# Patient Record
Sex: Male | Born: 1935 | Race: White | Hispanic: No | Smoking: Never smoker
Health system: Southern US, Community
[De-identification: ages and names within clinical notes are randomized; demographics above are authoritative.]

## PROBLEM LIST (undated history)

## (undated) DIAGNOSIS — K219 Gastro-esophageal reflux disease without esophagitis: Secondary | ICD-10-CM

## (undated) DIAGNOSIS — C259 Malignant neoplasm of pancreas, unspecified: Secondary | ICD-10-CM

## (undated) DIAGNOSIS — E876 Hypokalemia: Secondary | ICD-10-CM

## (undated) DIAGNOSIS — R0600 Dyspnea, unspecified: Secondary | ICD-10-CM

## (undated) DIAGNOSIS — Z9289 Personal history of other medical treatment: Secondary | ICD-10-CM

## (undated) DIAGNOSIS — M5412 Radiculopathy, cervical region: Secondary | ICD-10-CM

## (undated) DIAGNOSIS — F32A Depression, unspecified: Secondary | ICD-10-CM

## (undated) DIAGNOSIS — J189 Pneumonia, unspecified organism: Secondary | ICD-10-CM

## (undated) DIAGNOSIS — F419 Anxiety disorder, unspecified: Secondary | ICD-10-CM

## (undated) DIAGNOSIS — N400 Enlarged prostate without lower urinary tract symptoms: Secondary | ICD-10-CM

## (undated) DIAGNOSIS — I10 Essential (primary) hypertension: Secondary | ICD-10-CM

## (undated) DIAGNOSIS — M539 Dorsopathy, unspecified: Secondary | ICD-10-CM

## (undated) DIAGNOSIS — D039 Melanoma in situ, unspecified: Secondary | ICD-10-CM

## (undated) DIAGNOSIS — F329 Major depressive disorder, single episode, unspecified: Secondary | ICD-10-CM

## (undated) DIAGNOSIS — H9190 Unspecified hearing loss, unspecified ear: Secondary | ICD-10-CM

## (undated) HISTORY — PX: TONSILLECTOMY: SUR1361

## (undated) HISTORY — DX: Melanoma in situ, unspecified: D03.9

## (undated) HISTORY — DX: Dorsopathy, unspecified: M53.9

## (undated) HISTORY — DX: Radiculopathy, cervical region: M54.12

## (undated) HISTORY — DX: Benign prostatic hyperplasia without lower urinary tract symptoms: N40.0

## (undated) HISTORY — PX: CATARACT EXTRACTION: SUR2

## (undated) HISTORY — DX: Essential (primary) hypertension: I10

## (undated) HISTORY — DX: Gastro-esophageal reflux disease without esophagitis: K21.9

---

## 1958-05-20 DIAGNOSIS — M539 Dorsopathy, unspecified: Secondary | ICD-10-CM

## 1958-05-20 HISTORY — DX: Dorsopathy, unspecified: M53.9

## 1998-04-19 LAB — FECAL OCCULT BLOOD, GUAIAC: Fecal Occult Blood: NEGATIVE

## 2002-05-26 ENCOUNTER — Encounter: Payer: Self-pay | Admitting: Internal Medicine

## 2003-05-16 ENCOUNTER — Encounter: Payer: Self-pay | Admitting: Internal Medicine

## 2004-02-16 ENCOUNTER — Encounter: Payer: Self-pay | Admitting: Internal Medicine

## 2004-03-30 ENCOUNTER — Ambulatory Visit: Payer: Self-pay | Admitting: Internal Medicine

## 2004-06-29 ENCOUNTER — Ambulatory Visit: Payer: Self-pay | Admitting: Internal Medicine

## 2005-02-18 ENCOUNTER — Ambulatory Visit: Payer: Self-pay | Admitting: Internal Medicine

## 2005-03-05 ENCOUNTER — Ambulatory Visit: Payer: Self-pay | Admitting: Internal Medicine

## 2005-05-22 ENCOUNTER — Ambulatory Visit: Payer: Self-pay | Admitting: Internal Medicine

## 2005-09-03 ENCOUNTER — Ambulatory Visit: Payer: Self-pay | Admitting: Internal Medicine

## 2006-03-05 ENCOUNTER — Ambulatory Visit: Payer: Self-pay | Admitting: Internal Medicine

## 2006-09-04 ENCOUNTER — Ambulatory Visit: Payer: Self-pay | Admitting: Internal Medicine

## 2006-09-04 LAB — CONVERTED CEMR LAB
ALT: 15 units/L (ref 0–40)
Albumin: 3.8 g/dL (ref 3.5–5.2)
Alkaline Phosphatase: 61 units/L (ref 39–117)
Bilirubin, Direct: 0.2 mg/dL (ref 0.0–0.3)
Chloride: 110 meq/L (ref 96–112)
Creatinine, Ser: 0.9 mg/dL (ref 0.4–1.5)
GFR calc non Af Amer: 86 mL/min
Sodium: 144 meq/L (ref 135–145)

## 2006-09-17 DIAGNOSIS — K219 Gastro-esophageal reflux disease without esophagitis: Secondary | ICD-10-CM

## 2006-09-17 DIAGNOSIS — I10 Essential (primary) hypertension: Secondary | ICD-10-CM

## 2006-09-17 DIAGNOSIS — M5412 Radiculopathy, cervical region: Secondary | ICD-10-CM | POA: Insufficient documentation

## 2006-10-01 ENCOUNTER — Ambulatory Visit: Payer: Self-pay | Admitting: Internal Medicine

## 2006-10-01 DIAGNOSIS — N401 Enlarged prostate with lower urinary tract symptoms: Secondary | ICD-10-CM

## 2006-10-01 DIAGNOSIS — N138 Other obstructive and reflux uropathy: Secondary | ICD-10-CM

## 2006-11-27 ENCOUNTER — Ambulatory Visit: Payer: Self-pay | Admitting: Internal Medicine

## 2006-11-27 DIAGNOSIS — M1991 Primary osteoarthritis, unspecified site: Secondary | ICD-10-CM | POA: Insufficient documentation

## 2007-03-06 ENCOUNTER — Ambulatory Visit: Payer: Self-pay | Admitting: Internal Medicine

## 2007-06-01 ENCOUNTER — Ambulatory Visit: Payer: Self-pay | Admitting: Internal Medicine

## 2007-12-04 ENCOUNTER — Ambulatory Visit: Payer: Self-pay | Admitting: Internal Medicine

## 2007-12-04 DIAGNOSIS — L570 Actinic keratosis: Secondary | ICD-10-CM

## 2007-12-08 LAB — CONVERTED CEMR LAB
BUN: 15 mg/dL (ref 6–23)
Basophils Relative: 0.7 % (ref 0.0–3.0)
CO2: 28 meq/L (ref 19–32)
Chloride: 105 meq/L (ref 96–112)
Creatinine, Ser: 0.9 mg/dL (ref 0.4–1.5)
Eosinophils Relative: 3.8 % (ref 0.0–5.0)
Glucose, Bld: 87 mg/dL (ref 70–99)
Lymphocytes Relative: 26.3 % (ref 12.0–46.0)
MCV: 96.1 fL (ref 78.0–100.0)
Neutrophils Relative %: 58.5 % (ref 43.0–77.0)
RBC: 4.14 M/uL — ABNORMAL LOW (ref 4.22–5.81)
WBC: 6 10*3/uL (ref 4.5–10.5)

## 2008-02-16 ENCOUNTER — Ambulatory Visit: Payer: Self-pay | Admitting: Internal Medicine

## 2008-06-07 ENCOUNTER — Ambulatory Visit: Payer: Self-pay | Admitting: Internal Medicine

## 2008-12-09 ENCOUNTER — Ambulatory Visit: Payer: Self-pay | Admitting: Internal Medicine

## 2008-12-12 ENCOUNTER — Ambulatory Visit: Payer: Self-pay | Admitting: Family Medicine

## 2008-12-12 DIAGNOSIS — M25569 Pain in unspecified knee: Secondary | ICD-10-CM | POA: Insufficient documentation

## 2009-02-22 ENCOUNTER — Ambulatory Visit: Payer: Self-pay | Admitting: Internal Medicine

## 2009-06-12 ENCOUNTER — Ambulatory Visit: Payer: Self-pay | Admitting: Internal Medicine

## 2009-06-14 LAB — CONVERTED CEMR LAB
AST: 15 units/L (ref 0–37)
Basophils Relative: 1.1 % (ref 0.0–3.0)
Bilirubin, Direct: 0 mg/dL (ref 0.0–0.3)
CO2: 30 meq/L (ref 19–32)
Calcium: 9.2 mg/dL (ref 8.4–10.5)
Eosinophils Relative: 5.8 % — ABNORMAL HIGH (ref 0.0–5.0)
HCT: 40.6 % (ref 39.0–52.0)
Lymphs Abs: 1.5 10*3/uL (ref 0.7–4.0)
MCV: 96.2 fL (ref 78.0–100.0)
Monocytes Absolute: 0.6 10*3/uL (ref 0.1–1.0)
Monocytes Relative: 11.1 % (ref 3.0–12.0)
Neutrophils Relative %: 56.9 % (ref 43.0–77.0)
Potassium: 4.3 meq/L (ref 3.5–5.1)
RBC: 4.22 M/uL (ref 4.22–5.81)
Sodium: 144 meq/L (ref 135–145)
TSH: 1.2 microintl units/mL (ref 0.35–5.50)
WBC: 5.8 10*3/uL (ref 4.5–10.5)

## 2009-11-30 ENCOUNTER — Ambulatory Visit: Payer: Self-pay | Admitting: Family Medicine

## 2009-11-30 DIAGNOSIS — L299 Pruritus, unspecified: Secondary | ICD-10-CM | POA: Insufficient documentation

## 2009-12-12 ENCOUNTER — Ambulatory Visit: Payer: Self-pay | Admitting: Internal Medicine

## 2010-02-08 ENCOUNTER — Ambulatory Visit: Payer: Self-pay | Admitting: Internal Medicine

## 2010-06-12 ENCOUNTER — Other Ambulatory Visit: Payer: Self-pay | Admitting: Internal Medicine

## 2010-06-12 ENCOUNTER — Ambulatory Visit
Admission: RE | Admit: 2010-06-12 | Discharge: 2010-06-12 | Payer: Self-pay | Source: Home / Self Care | Attending: Internal Medicine | Admitting: Internal Medicine

## 2010-06-12 LAB — HEPATIC FUNCTION PANEL
AST: 15 U/L (ref 0–37)
Albumin: 3.9 g/dL (ref 3.5–5.2)

## 2010-06-12 LAB — CBC WITH DIFFERENTIAL/PLATELET
Basophils Absolute: 0 10*3/uL (ref 0.0–0.1)
Eosinophils Absolute: 0.3 10*3/uL (ref 0.0–0.7)
HCT: 38.8 % — ABNORMAL LOW (ref 39.0–52.0)
Hemoglobin: 13.6 g/dL (ref 13.0–17.0)
Lymphs Abs: 1.5 10*3/uL (ref 0.7–4.0)
MCHC: 35.1 g/dL (ref 30.0–36.0)
MCV: 95 fl (ref 78.0–100.0)
Monocytes Absolute: 0.7 10*3/uL (ref 0.1–1.0)
Neutro Abs: 3.6 10*3/uL (ref 1.4–7.7)
RDW: 13.8 % (ref 11.5–14.6)

## 2010-06-12 LAB — RENAL FUNCTION PANEL
Albumin: 3.9 g/dL (ref 3.5–5.2)
BUN: 21 mg/dL (ref 6–23)
CO2: 28 mEq/L (ref 19–32)
Chloride: 109 mEq/L (ref 96–112)
Creatinine, Ser: 1 mg/dL (ref 0.4–1.5)

## 2010-06-12 LAB — TSH: TSH: 1.51 u[IU]/mL (ref 0.35–5.50)

## 2010-06-19 NOTE — Assessment & Plan Note (Signed)
Summary: rash/dlo   Vital Signs:  Patient profile:   77 year old male Height:      65 inches Weight:      141.6 pounds BMI:     23.65 Temp:     98.0 degrees F oral Pulse rate:   78 / minute Pulse rhythm:   regular BP sitting:   130 / 70  (left arm) Cuff size:   large  Vitals Entered By: Benny Lennert CMA Duncan Dull) (November 30, 2009 9:42 AM)  History of Present Illness: Chief complaint rash  77 year old with itchy rash, scattered B leg, upper thighs, few on torso not painful  has been doing  alot of gardening  dog but not in the bed.  No fever chills, sweats  GEN: Well-developed,well-nourished,in no acute distress; alert,appropriate and cooperative throughout examination HEENT: Normocephalic and atraumatic without obvious abnormalitiesEars, externally no deformities PULM: Breathing comfortably in no respiratory distress EXT: No clubbing, cyanosis, or edema PSYCH: Normally interactive. Cooperative during the interview. Pleasant. Friendly and conversant. Not anxious or depressed appearing. Normal, full affect.   Skin: scattered non-vesicular lesions, solitary, slightly red, evidence of excoriation  Allergies (verified): No Known Drug Allergies   Impression & Recommendations:  Problem # 1:  UNSPECIFIED PRURITIC DISORDER (ICD-698.9) rash, looks most like some type of insect bites, chigger bites be sure to treat dog for fleas  TAC two times a day   Orders: Prescription Created Electronically [phone])  Complete Medication List: 1)  Atenolol 25 Mg Tabs (Atenolol) .... Take one by mouth daily 2)  Gabapentin 600 Mg Tabs (Gabapentin) .... Take 1/2 by mouth every am, one by mouth every pm 3)  Aspirin 81 Mg Tbec (Aspirin) .... Take one by mouth daily 4)  Meloxicam 15 Mg Tabs (Meloxicam) .... 1/2 -1 tab  by mouth daily as needed for arthritis pain. take after eating 5)  Triamcinolone Acetonide 0.1 % Crea (Triamcinolone acetonide) .... Apply two times a day to affected  areas  Patient Instructions: 1)  SHINGLES --- CHECK WITH YOUR INSURANCE Prescriptions: TRIAMCINOLONE ACETONIDE 0.1 % CREA (TRIAMCINOLONE ACETONIDE) Apply two times a day to affected areas  #1 pound jar x 0   Entered and Authorized by:   Hannah Beat MD   Signed by:   Hannah Beat MD on 11/30/2009   Method used:   Electronically to        [facility] [phone][facility] (retail)       [address] [place], [place]  [address]       Ph: [phone] or [phone]       Fax: [phone]   RxID:   [id number]   Current Allergies (reviewed today): No known allergies

## 2010-06-19 NOTE — Assessment & Plan Note (Signed)
Summary: 6 MONTH FOLLOW UP/[facility]   Vital Signs:  Patient profile:   77 year old male Weight:      142.50 pounds Temp:     97.9 degrees F oral Pulse rate:   64 / minute Pulse rhythm:   regular BP sitting:   120 / 70  (left arm) Cuff size:   regular  Vitals Entered By: Sydell Axon LPN (December 12, 2009 10:22 AM) CC: 6 Month follow-up   History of Present Illness: In with wife as usual  still has nerve pain in neck if misses gabapentin, comes back pretty quick On the med, he does fine No apparent side effects  Urine stream still slow Nocturia x 2 generally---occ trouble getting back to sleep No sig daytime issues  No chest pain no SOB No leg or foot swelling  Has lots of stiffness in hands --all day some knee pain--better with heat occ uses tylenol--?slight help  Allergies: No Known Drug Allergies  Past History:  Past medical, surgical, family and social histories (including risk factors) reviewed for relevance to current acute and chronic problems.  Past Medical History: Reviewed history from 12/09/2008 and no changes required. GERD Hypertension Cervical radiculopathy with neuropathy Benign prostatic hypertrophy  Past Surgical History: Reviewed history from 09/17/2006 and no changes required. Back problems ?1960 Tonsils 1943 Cataract OD  Family History: Reviewed history from 09/17/2006 and no changes required. Dad died @52  unknown cause Mom died@72  breast cancer No CAD, DM  Social History: Retired--[employer] Married--2 sons Never Smoked Alcohol use-no   Has living will Wife is health care POA. Then would want sons.  would accept resuscitation but no prolonged ventilation No feeding tube if cognitively unaware  Review of Systems       Occ gas---gets it up substernally. No meds for this appetite is okay sleeps okay---occ uses plain tylenol and this helps weight is stable Rash is better--probably was chiggers  Physical Exam  General:   alert and normal appearance.   Neck:  supple, no masses, no thyromegaly, no carotid bruits, and no cervical lymphadenopathy.   Lungs:  normal respiratory effort, no intercostal retractions, no accessory muscle use, and normal breath sounds.   Heart:  normal rate, regular rhythm, and no gallop.   Soft systolic murmur LLSB Abdomen:  soft, non-tender, and no masses.   Msk:  no joint tenderness and no joint swelling.   Pulses:  1+ in feet Extremities:  no edema Neurologic:  alert & oriented X3, strength normal in all extremities, and gait normal.   Psych:  normally interactive, good eye contact, not anxious appearing, and not depressed appearing.     Impression & Recommendations:  Problem # 1:  HYPERTENSION (ICD-401.9) Assessment Unchanged doing fine no changes needed  His updated medication list for this problem includes:    Atenolol 25 Mg Tabs (Atenolol) .Marland Kitchen... Take one by mouth daily  BP today: 120/70 Prior BP: 130/70 (11/30/2009)  Labs Reviewed: K+: 4.3 (06/12/2009) Creat: : 1.0 (06/12/2009)     Problem # 2:  BENIGN PROSTATIC HYPERTROPHY (ICD-600.00) Assessment: Unchanged voids slow still but no Rx needed  Problem # 3:  CERVICAL RADICULOPATHY (ICD-723.4) Assessment: Unchanged needs the gabapentin uses meloxicam rarely  Problem # 4:  GERD (ICD-530.81) Assessment: Unchanged only occ has symptoms rarely uses gas-x  Complete Medication List: 1)  Atenolol 25 Mg Tabs (Atenolol) .... Take one by mouth daily 2)  Gabapentin 600 Mg Tabs (Gabapentin) .... Take 1/2 by mouth every am, one by mouth every  pm 3)  Aspirin 81 Mg Tbec (Aspirin) .... Take one by mouth daily 4)  Meloxicam 15 Mg Tabs (Meloxicam) .... 1/2 -1 tab  by mouth daily as needed for arthritis pain. take after eating 5)  Triamcinolone Acetonide 0.1 % Crea (Triamcinolone acetonide) .... Apply two times a day to affected areas  Current Allergies (reviewed today): No known allergies   Appended Document: 6 MONTH  FOLLOW UP/[facility]     Allergies: No Known Drug Allergies   Complete Medication List: 1)  Atenolol 25 Mg Tabs (Atenolol) .... Take one by mouth daily 2)  Gabapentin 600 Mg Tabs (Gabapentin) .... Take 1/2 by mouth every am, one by mouth every pm 3)  Aspirin 81 Mg Tbec (Aspirin) .... Take one by mouth daily 4)  Meloxicam 15 Mg Tabs (Meloxicam) .... 1/2 -1 tab  by mouth daily as needed for arthritis pain. take after eating 5)  Triamcinolone Acetonide 0.1 % Crea (Triamcinolone acetonide) .... Apply two times a day to affected areas  Patient Instructions: 1)  Please schedule a follow-up appointment in 6 months .

## 2010-06-19 NOTE — Assessment & Plan Note (Signed)
Summary: ROA 6 MTHS  CYD   Vital Signs:  Patient profile:   77 year old male Weight:      142 pounds Temp:     98.0 degrees F oral BP sitting:   126 / 70  (left arm) Cuff size:   large  Vitals Entered By: Mervin Hack CMA Duncan Dull) (June 12, 2009 11:09 AM) CC: 6 month follow-up   History of Present Illness: Doing fairly well Still with left knee pain has meloxicam 15mg  to take does give relief but bothers stomach some  Doing okay otherwise occ headache but nothing consistent  Doesn't take BP No chest pain No SOB No edema  Voids slowly not bad frequency in day but notes 2/night nocturia still okay without meds  Allergies: No Known Drug Allergies  Past History:  Past medical, surgical, family and social histories (including risk factors) reviewed for relevance to current acute and chronic problems.  Past Medical History: Reviewed history from 12/09/2008 and no changes required. GERD Hypertension Cervical radiculopathy with neuropathy Benign prostatic hypertrophy  Past Surgical History: Reviewed history from 09/17/2006 and no changes required. Back problems ?1960 Tonsils 1943 Cataract OD  Family History: Reviewed history from 09/17/2006 and no changes required. Dad died @52  unknown cause Mom died@72  breast cancer No CAD, DM  Social History: Reviewed history from 09/17/2006 and no changes required. Retired--[employer] Married--2 sons Never Smoked Alcohol use-no   Has living will Wife is health care POA  Review of Systems       sleeps okay appetite is okay--best in the AM weight is stable  Physical Exam  General:  alert and normal appearance.   Neck:  supple, no masses, no thyromegaly, no carotid bruits, and no cervical lymphadenopathy.   Lungs:  normal respiratory effort and normal breath sounds.   Heart:  normal rate, regular rhythm, no murmur, and no gallop.   Abdomen:  soft and non-tender.   No suprapubic dullness Extremities:   no edema Psych:  normally interactive, good eye contact, not anxious appearing, and not depressed appearing.     Impression & Recommendations:  Problem # 1:  HYPERTENSION (ICD-401.9) Assessment Unchanged  good control due for labs  His updated medication list for this problem includes:    Atenolol 25 Mg Tabs (Atenolol) .Marland Kitchen... Take one by mouth daily  BP today: 126/70 Prior BP: 120/78 (12/12/2008)  Labs Reviewed: K+: 4.8 (12/04/2007) Creat: : 0.9 (12/04/2007)     Orders: TLB-Renal Function Panel (80069-RENAL) TLB-CBC Platelet - w/Differential (85025-CBCD) TLB-Hepatic/Liver Function Pnl (80076-HEPATIC) TLB-TSH (Thyroid Stimulating Hormone) (84443-TSH) Venipuncture (52841)  Problem # 2:  BENIGN PROSTATIC HYPERTROPHY (ICD-600.00) Assessment: Unchanged mild stable symptoms no meds needed  Problem # 3:  OSTEOARTHROSIS, LOCAL, PRIMARY, UNSPC SITE (ICD-715.10) Assessment: Unchanged left knee discussed how to best use the meloxicam okay to try heat--he does use this May need cortisone shot again  His updated medication list for this problem includes:    Aspirin 81 Mg Tbec (Aspirin) .Marland Kitchen... Take one by mouth daily    Meloxicam 15 Mg Tabs (Meloxicam) .Marland Kitchen... 1/2 -1 tab  by mouth daily as needed for arthritis pain. take after eating  Problem # 4:  CERVICAL RADICULOPATHY (ICD-723.4) Assessment: Unchanged seems to be well controlled on gabapentin  Complete Medication List: 1)  Atenolol 25 Mg Tabs (Atenolol) .... Take one by mouth daily 2)  Gabapentin 600 Mg Tabs (Gabapentin) .... Take 1/2 by mouth every am, one by mouth every pm 3)  Aspirin 81 Mg Tbec (Aspirin) .Marland KitchenMarland KitchenMarland Kitchen  Take one by mouth daily 4)  Meloxicam 15 Mg Tabs (Meloxicam) .... 1/2 -1 tab  by mouth daily as needed for arthritis pain. take after eating  Patient Instructions: 1)  Please schedule a follow-up appointment in 6 months .   Current Allergies (reviewed today): No known allergies

## 2010-06-19 NOTE — Assessment & Plan Note (Signed)
Summary: FLU SHOT / LFW  Nurse Visit   Allergies: No Known Drug Allergies  Immunizations Administered:  Influenza Vaccine # 1:    Vaccine Type: Fluvax 3+    Site: left deltoid    Mfr: GlaxoSmithKline    Dose: 0.5 ml    Route: IM    Given by: Mervin Hack CMA (AAMA)    Exp. Date: 11/17/2010    Lot #: [id number]    VIS given: 12/12/09 version given February 08, 2010.  Flu Vaccine Consent Questions:    Do you have a history of severe allergic reactions to this vaccine? no    Any prior history of allergic reactions to egg and/or gelatin? no    Do you have a sensitivity to the preservative Thimersol? no    Do you have a past history of Guillan-Barre Syndrome? no    Do you currently have an acute febrile illness? no    Have you ever had a severe reaction to latex? no    Vaccine information given and explained to patient? yes  Orders Added: 1)  Flu Vaccine 26yrs + [90658] 2)  Admin 1st Vaccine [09811]

## 2010-06-21 NOTE — Assessment & Plan Note (Signed)
Summary: ROA FOR 6 MONTH FOLLOW-UP/JRR   Vital Signs:  Patient profile:   77 year old male Weight:      144 pounds Temp:     98.1 degrees F oral Pulse rate:   71 / minute Pulse rhythm:   regular BP sitting:   122 / 70  (left arm) Cuff size:   regular  Vitals Entered By: Mervin Hack CMA Duncan Dull) (June 12, 2010 10:13 AM) CC: 6 month follow-up   History of Present Illness: Here with wife he has to supervise her all the time Discussed respite as this can be stressful for him  Some sleep problems May awaken 2-3AM and not go back to sleep No naps No sig daytime somnolence Occ takes tylenol PM-- generally helps a little  No chest pain No SOB no edema  Has slow urination and frequency Nocturia x 2 usually  Does get soreness and stiffness--esp in hands some in legs also Tylenol occ---may help with sleep Advised 2 tylenol at night without the "PM"  Allergies: No Known Drug Allergies  Past History:  Past medical, surgical, family and social histories (including risk factors) reviewed for relevance to current acute and chronic problems.  Past Medical History: Reviewed history from 12/09/2008 and no changes required. GERD Hypertension Cervical radiculopathy with neuropathy Benign prostatic hypertrophy  Past Surgical History: Reviewed history from 09/17/2006 and no changes required. Back problems ?1960 Tonsils 1943 Cataract OD  Family History: Reviewed history from 09/17/2006 and no changes required. Dad died @52  unknown cause Mom died@72  breast cancer No CAD, DM  Social History: Reviewed history from 12/12/2009 and no changes required. Retired--[employer] Married--2 sons Never Smoked Alcohol use-no   Has living will Wife is health care POA. Then would want sons.  would accept resuscitation but no prolonged ventilation No feeding tube if cognitively unaware  Review of Systems       Occ gets gas pushing up in stomach No heartburn Bowels  okay appetite is fair---they usually eat 2 meals per day weight stable  Physical Exam  General:  alert and normal appearance.   Neck:  supple, no masses, no thyromegaly, no carotid bruits, and no cervical lymphadenopathy.   Lungs:  normal respiratory effort, no intercostal retractions, no accessory muscle use, and normal breath sounds.   Heart:  normal rate, regular rhythm, no murmur, and no gallop.   Abdomen:  soft and non-tender.   Msk:  no joint tenderness and no joint swelling.   Sig stiffness with movement Extremities:  no edema Psych:  normally interactive, good eye contact, not anxious appearing, and not depressed appearing.     Impression & Recommendations:  Problem # 1:  HYPERTENSION (ICD-401.9) Assessment Unchanged  good control no changes due for labs  His updated medication list for this problem includes:    Atenolol 25 Mg Tabs (Atenolol) .Marland Kitchen... Take one by mouth daily  BP today: 122/70 Prior BP: 120/70 (12/12/2009)  Labs Reviewed: K+: 4.3 (06/12/2009) Creat: : 1.0 (06/12/2009)     Orders: TLB-Renal Function Panel (80069-RENAL) TLB-CBC Platelet - w/Differential (85025-CBCD) TLB-Hepatic/Liver Function Pnl (80076-HEPATIC) TLB-TSH (Thyroid Stimulating Hormone) (84443-TSH) Venipuncture (04540)  Problem # 2:  OSTEOARTHROSIS, LOCAL, PRIMARY, UNSPC SITE (ICD-715.10) Assessment: Deteriorated discussed regular tylenol  The following medications were removed from the medication list:    Meloxicam 15 Mg Tabs (Meloxicam) .Marland Kitchen... 1/2 -1 tab  by mouth daily as needed for arthritis pain. take after eating His updated medication list for this problem includes:    Aspirin 81  Mg Tbec (Aspirin) .Marland Kitchen... Take one by mouth daily  Problem # 3:  CERVICAL RADICULOPATHY (ICD-723.4) Assessment: Unchanged still controlled fairly well with 1.5 gabapentin daily Left side of neck and shoulder  Problem # 4:  BENIGN PROSTATIC HYPERTROPHY (ICD-600.00) Assessment: Unchanged mild  symptoms no meds for now  Complete Medication List: 1)  Atenolol 25 Mg Tabs (Atenolol) .... Take one by mouth daily 2)  Gabapentin 600 Mg Tabs (Gabapentin) .... Take 1/2 by mouth every am, one by mouth every pm 3)  Aspirin 81 Mg Tbec (Aspirin) .... Take one by mouth daily  Patient Instructions: 1)  Please try arthritis tylenol (acetominophen) 650mg  three times a day for the arthritis 2)  Please schedule a follow-up appointment in 6 months .    Orders Added: 1)  TLB-Renal Function Panel [80069-RENAL] 2)  TLB-CBC Platelet - w/Differential [85025-CBCD] 3)  TLB-Hepatic/Liver Function Pnl [80076-HEPATIC] 4)  TLB-TSH (Thyroid Stimulating Hormone) [84443-TSH] 5)  Venipuncture [04540] 6)  Est. Patient Level IV [98119]    Current Allergies (reviewed today): No known allergies

## 2010-08-27 ENCOUNTER — Other Ambulatory Visit: Payer: Self-pay | Admitting: Internal Medicine

## 2010-10-26 ENCOUNTER — Other Ambulatory Visit: Payer: Self-pay | Admitting: *Deleted

## 2010-10-26 NOTE — Telephone Encounter (Signed)
[facility] [address] is on your desk.  Please check quantity and refills.

## 2010-10-27 NOTE — Telephone Encounter (Signed)
Please confirm dose with patient and okay to refill for 1 year

## 2010-10-29 MED ORDER — GABAPENTIN 600 MG PO TABS: ORAL_TABLET | ORAL | Status: DC

## 2010-10-29 NOTE — Telephone Encounter (Signed)
rx sent to pharmacy by e-script  

## 2010-10-31 ENCOUNTER — Ambulatory Visit (INDEPENDENT_AMBULATORY_CARE_PROVIDER_SITE_OTHER): Payer: Medicare Other | Admitting: Internal Medicine

## 2010-10-31 ENCOUNTER — Encounter: Payer: Self-pay | Admitting: Internal Medicine

## 2010-10-31 DIAGNOSIS — M159 Polyosteoarthritis, unspecified: Secondary | ICD-10-CM | POA: Insufficient documentation

## 2010-10-31 DIAGNOSIS — M1710 Unilateral primary osteoarthritis, unspecified knee: Secondary | ICD-10-CM

## 2010-10-31 MED ORDER — TRIAMCINOLONE ACETONIDE 40 MG/ML IJ SUSP: 40.0000 mg | Freq: Once | INTRAMUSCULAR | Status: DC

## 2010-10-31 NOTE — Progress Notes (Signed)
  Subjective:    Patient ID: Jose [name], male    DOB: Apr 29, 1924, 77 y.o.   MRN: [id number]  HPI Having pain in both knees but right knee is "throbbing" Pain has gone back for a long time but now having trouble even walking on the right knee Couldn't hold off till July  Tried ibuprofen 1-2 daily, or aleve 1-2 daily. Occ took both No clear help though the ibuprofen may have helped at first  Has gotten shot in left knee in past--that really helped then  No sig swelling No recent injury  Current Outpatient Prescriptions on File Prior to Visit  Medication Sig Dispense Refill  . atenolol (TENORMIN) 25 MG tablet TAKE 1 TABLET EVERY DAY  30 tablet  10  . gabapentin (NEURONTIN) 600 MG tablet Take one half by mouth every morning and one by mouth every evening  45 tablet  11   Past Medical History  Diagnosis Date  . GERD (gastroesophageal reflux disease)   . Hypertension   . BPH (benign prostatic hypertrophy)   . Cervical radiculopathy     with neuropathy  . Back problem 1960    Past Surgical History  Procedure Date  . Tonsillectomy   . Cataract extraction     OD    Family History  Problem Relation Age of Onset  . Coronary artery disease Neg Hx   . Diabetes Neg Hx     History   Social History  . Marital Status: Married    Spouse Name: N/A    Number of Children: 2  . Years of Education: N/A   Occupational History  . retired- Merchandiser, retail [employer]    Social History Main Topics  . Smoking status: Never Smoker   . Smokeless tobacco: Never Used  . Alcohol Use: No  . Drug Use: No  . Sexually Active: Not on file   Other Topics Concern  . Not on file   Social History Narrative   Has living willWife is health care POA. Then would want sons. would accept resuscitation but no prolonged ventilationNo feeding tube if cognitively unaware   Review of Systems No other sig joint issues No recent heartburn issues--rare symptoms and occ takes a tums    Objective:   Physical Exam  Constitutional: He appears well-developed and well-nourished. No distress.  Musculoskeletal:       Knees without sig effusion Left knee has full ROM. ?slight crepitus. No ligament or meniscus findings Right knee has marked crepitus and pain with passive ROM. Mild pain with MacMurray tests but no clear meniscus findings No ligament instability there  Neurological:       Gait is stiff but he has full weight bearing Uses cane          Assessment & Plan:

## 2010-10-31 NOTE — Patient Instructions (Signed)
Please keep your July follow up appointment You can try ibuprofen 200-400mg  up to three times daily

## 2010-10-31 NOTE — Assessment & Plan Note (Signed)
Procedure  Sterile prep to right knee Medial approach Local with 2cc 2% plain lido 40mg  kenalog and 7cc 2% lido instilled in knee without difficulty Patient noted immediate pain relief Discussed home care

## 2010-10-31 NOTE — Assessment & Plan Note (Signed)
Having some more difficulty in general Discussed heat and ibuprofen use more regularly if needed

## 2010-12-11 ENCOUNTER — Encounter: Payer: Self-pay | Admitting: Internal Medicine

## 2010-12-11 ENCOUNTER — Ambulatory Visit (INDEPENDENT_AMBULATORY_CARE_PROVIDER_SITE_OTHER): Payer: Medicare Other | Admitting: Internal Medicine

## 2010-12-11 DIAGNOSIS — F439 Reaction to severe stress, unspecified: Secondary | ICD-10-CM

## 2010-12-11 DIAGNOSIS — Z733 Stress, not elsewhere classified: Secondary | ICD-10-CM

## 2010-12-11 DIAGNOSIS — N400 Enlarged prostate without lower urinary tract symptoms: Secondary | ICD-10-CM

## 2010-12-11 DIAGNOSIS — I10 Essential (primary) hypertension: Secondary | ICD-10-CM

## 2010-12-11 DIAGNOSIS — M5412 Radiculopathy, cervical region: Secondary | ICD-10-CM

## 2010-12-11 NOTE — Assessment & Plan Note (Signed)
Voiding is slow Nocturia x 2-3 Okay without meds

## 2010-12-11 NOTE — Assessment & Plan Note (Signed)
BP Readings from Last 3 Encounters:  12/11/10 110/60  10/31/10 140/60  06/12/10 122/70   Good control No changes needed

## 2010-12-11 NOTE — Assessment & Plan Note (Signed)
Still gets good relief with gabapentin If he forgets, pain returns

## 2010-12-11 NOTE — Assessment & Plan Note (Signed)
Clear caregiving stress Discussed need for help Gave "caregiver bill of rights"

## 2010-12-11 NOTE — Progress Notes (Signed)
  Subjective:    Patient ID: Jose [name], male    DOB: 1923-07-24, 77 y.o.   MRN: [id number]  HPI He is doing okay himself Ongoing stress with wife with Altzheimers who is worsening She eloped and fell on highway. Fortunately, just with facial bruising He is not able to keep her alone DIL is looking into help----personal care aide Still very stressed. He does all the housework, etc  No chest pain No SOb Tries to stay active--housework, garden (1/2 acre)  Only occ pain from nerve in neck Gabapentin seems to keep it controlled He does note the pain if he forgets dose---affects his sleep  Knee is some better with cortisone shot Uses ibuprofen 200mg  daily and it helps  Stomach generally quiet  Current Outpatient Prescriptions on File Prior to Visit  Medication Sig Dispense Refill  . aspirin 81 MG tablet Take 81 mg by mouth daily.        Marland Kitchen gabapentin (NEURONTIN) 600 MG tablet Take one half by mouth every morning and one by mouth every evening  45 tablet  11   Current Facility-Administered Medications on File Prior to Visit  Medication Dose Route Frequency Provider Last Rate Last Dose  . triamcinolone acetonide (KENALOG-40) injection 40 mg  40 mg Intra-articular Once Varney Baas, MD        No Known Allergies  Past Medical History  Diagnosis Date  . GERD (gastroesophageal reflux disease)   . Hypertension   . BPH (benign prostatic hypertrophy)   . Cervical radiculopathy     with neuropathy  . Back problem 1960    Past Surgical History  Procedure Date  . Tonsillectomy   . Cataract extraction     OD    Family History  Problem Relation Age of Onset  . Coronary artery disease Neg Hx   . Diabetes Neg Hx     History   Social History  . Marital Status: Married    Spouse Name: N/A    Number of Children: 2  . Years of Education: N/A   Occupational History  . retired- Merchandiser, retail [employer]    Social History Main Topics  . Smoking status: Never Smoker   .  Smokeless tobacco: Never Used  . Alcohol Use: No  . Drug Use: No  . Sexually Active: Not on file   Other Topics Concern  . Not on file   Social History Narrative   Has living willWife is health care POA. Then would want sons. would accept resuscitation but no prolonged ventilationNo feeding tube if cognitively unaware   Review of Systems Appetite is fine Weight is stable Sleep is not great--light to monitor wife     Objective:   Physical Exam  Constitutional: He appears well-developed and well-nourished. No distress.  Neck: Normal range of motion. Neck supple. No thyromegaly present.  Cardiovascular: Normal rate, regular rhythm and normal heart sounds.  Exam reveals no gallop.   No murmur heard. Pulmonary/Chest: Effort normal and breath sounds normal. No respiratory distress. He has no wheezes. He has no rales.  Abdominal: Soft. There is no tenderness.  Musculoskeletal: Normal range of motion. He exhibits no edema and no tenderness.  Lymphadenopathy:    He has no cervical adenopathy.  Psychiatric: He has a normal mood and affect. His behavior is normal. Judgment and thought content normal.          Assessment & Plan:

## 2011-02-26 ENCOUNTER — Ambulatory Visit (INDEPENDENT_AMBULATORY_CARE_PROVIDER_SITE_OTHER): Payer: Medicare Other

## 2011-02-26 DIAGNOSIS — Z23 Encounter for immunization: Secondary | ICD-10-CM

## 2011-06-14 ENCOUNTER — Ambulatory Visit: Payer: Medicare Other | Admitting: Internal Medicine

## 2011-06-25 ENCOUNTER — Ambulatory Visit (INDEPENDENT_AMBULATORY_CARE_PROVIDER_SITE_OTHER): Payer: Medicare Other | Admitting: Internal Medicine

## 2011-06-25 ENCOUNTER — Encounter: Payer: Self-pay | Admitting: Internal Medicine

## 2011-06-25 VITALS — BP 128/68 | HR 59 | Temp 98.3°F | Ht 65.0 in | Wt 142.0 lb

## 2011-06-25 DIAGNOSIS — Z733 Stress, not elsewhere classified: Secondary | ICD-10-CM

## 2011-06-25 DIAGNOSIS — M179 Osteoarthritis of knee, unspecified: Secondary | ICD-10-CM

## 2011-06-25 DIAGNOSIS — M5412 Radiculopathy, cervical region: Secondary | ICD-10-CM

## 2011-06-25 DIAGNOSIS — M1710 Unilateral primary osteoarthritis, unspecified knee: Secondary | ICD-10-CM

## 2011-06-25 DIAGNOSIS — F439 Reaction to severe stress, unspecified: Secondary | ICD-10-CM

## 2011-06-25 DIAGNOSIS — I10 Essential (primary) hypertension: Secondary | ICD-10-CM

## 2011-06-25 DIAGNOSIS — N400 Enlarged prostate without lower urinary tract symptoms: Secondary | ICD-10-CM

## 2011-06-25 LAB — CBC WITH DIFFERENTIAL/PLATELET
Basophils Relative: 0.9 % (ref 0.0–3.0)
Eosinophils Relative: 7.4 % — ABNORMAL HIGH (ref 0.0–5.0)
Lymphocytes Relative: 24.5 % (ref 12.0–46.0)
MCV: 95.7 fl (ref 78.0–100.0)
Monocytes Absolute: 0.7 10*3/uL (ref 0.1–1.0)
Monocytes Relative: 11.3 % (ref 3.0–12.0)
Neutrophils Relative %: 55.9 % (ref 43.0–77.0)
Platelets: 153 10*3/uL (ref 150.0–400.0)
RBC: 4.19 Mil/uL — ABNORMAL LOW (ref 4.22–5.81)
WBC: 6.4 10*3/uL (ref 4.5–10.5)

## 2011-06-25 NOTE — Assessment & Plan Note (Signed)
BP Readings from Last 3 Encounters:  06/25/11 128/68  12/11/10 110/60  10/31/10 140/60   Good control Due for labs No change

## 2011-06-25 NOTE — Assessment & Plan Note (Signed)
Ongoing stress with wife Needs respite  Will refer to [facility] to try to access community resources

## 2011-06-25 NOTE — Progress Notes (Signed)
  Subjective:    Patient ID: Jose [name], male    DOB: 1924-04-10, 78 y.o.   MRN: [id number]  HPI Ongoing issues with wife Can't really do anything Still not sleeping ---up wandering at night, turning on lights, etc He has door fixed so she can't leave Does still try to leave if not attended all the time Still no aide---just has cleaner  No chest pain No SOB No headaches No dizziness or syncope  Gets shoulder pains if he forgets the gabapentin Knee pains regularly--ibuprofen helps  Voids okay but not great Slow with initiation Stable 1-2 per night nocturia   Current Outpatient Prescriptions on File Prior to Visit  Medication Sig Dispense Refill  . aspirin 81 MG tablet Take 81 mg by mouth daily.        Marland Kitchen atenolol (TENORMIN) 25 MG tablet Take 25 mg by mouth daily.        Marland Kitchen gabapentin (NEURONTIN) 600 MG tablet Take one half by mouth every morning and one by mouth every evening  45 tablet  11   Current Facility-Administered Medications on File Prior to Visit  Medication Dose Route Frequency Provider Last Rate Last Dose  . DISCONTD: triamcinolone acetonide (KENALOG-40) injection 40 mg  40 mg Intra-articular Once Varney Baas, MD        No Known Allergies  Past Medical History  Diagnosis Date  . GERD (gastroesophageal reflux disease)   . Hypertension   . BPH (benign prostatic hypertrophy)   . Cervical radiculopathy     with neuropathy  . Back problem 1960    Past Surgical History  Procedure Date  . Tonsillectomy   . Cataract extraction     OD    Family History  Problem Relation Age of Onset  . Coronary artery disease Neg Hx   . Diabetes Neg Hx     History   Social History  . Marital Status: Married    Spouse Name: N/A    Number of Children: 2  . Years of Education: N/A   Occupational History  . retired- Merchandiser, retail [employer]    Social History Main Topics  . Smoking status: Never Smoker   . Smokeless tobacco: Never Used  . Alcohol Use: No  .  Drug Use: No  . Sexually Active: Not on file   Other Topics Concern  . Not on file   Social History Narrative   Has living willWife is health care POA. Then would want sons. would accept resuscitation but no prolonged ventilationNo feeding tube if cognitively unaware   Review of Systems Irritated area on right temple Appetite is okay Weight is fairly stable    Objective:   Physical Exam  Constitutional: He appears well-developed and well-nourished. No distress.  Neck: Normal range of motion. Neck supple. No thyromegaly present.  Cardiovascular: Normal rate and regular rhythm.  Exam reveals no gallop.        ?faint systolic murmur  Pulmonary/Chest: Effort normal and breath sounds normal. No respiratory distress. He has no wheezes. He has no rales.  Musculoskeletal: He exhibits no edema and no tenderness.  Lymphadenopathy:    He has no cervical adenopathy.  Psychiatric: He has a normal mood and affect. His behavior is normal. Judgment and thought content normal.          Assessment & Plan:

## 2011-06-25 NOTE — Assessment & Plan Note (Signed)
Uses the gabapentin Gets shoulder pain if he misses doses

## 2011-06-25 NOTE — Assessment & Plan Note (Signed)
Uses ibuprofen with relief

## 2011-06-25 NOTE — Assessment & Plan Note (Signed)
Ongoing symptoms but he prefers no meds for now

## 2011-06-26 LAB — TSH: TSH: 1.74 u[IU]/mL (ref 0.35–5.50)

## 2011-06-26 LAB — BASIC METABOLIC PANEL
BUN: 23 mg/dL (ref 6–23)
Calcium: 9.2 mg/dL (ref 8.4–10.5)
Chloride: 108 mEq/L (ref 96–112)
Creatinine, Ser: 1 mg/dL (ref 0.4–1.5)
GFR: 72.04 mL/min (ref >60.00)

## 2011-06-26 LAB — HEPATIC FUNCTION PANEL
ALT: 18 U/L (ref 0–53)
AST: 18 U/L (ref 0–37)
Alkaline Phosphatase: 62 U/L (ref 39–117)
Bilirubin, Direct: 0.1 mg/dL (ref 0.0–0.3)
Total Bilirubin: 0.7 mg/dL (ref 0.3–1.2)

## 2011-07-26 ENCOUNTER — Other Ambulatory Visit: Payer: Self-pay | Admitting: Internal Medicine

## 2011-10-25 ENCOUNTER — Other Ambulatory Visit: Payer: Self-pay | Admitting: *Deleted

## 2011-10-25 ENCOUNTER — Encounter: Payer: Self-pay | Admitting: Internal Medicine

## 2011-10-25 ENCOUNTER — Ambulatory Visit (INDEPENDENT_AMBULATORY_CARE_PROVIDER_SITE_OTHER): Payer: Medicare Other | Admitting: Internal Medicine

## 2011-10-25 VITALS — BP 102/60 | HR 83 | Temp 99.0°F | Ht 65.0 in | Wt 139.0 lb

## 2011-10-25 DIAGNOSIS — M5412 Radiculopathy, cervical region: Secondary | ICD-10-CM

## 2011-10-25 DIAGNOSIS — N400 Enlarged prostate without lower urinary tract symptoms: Secondary | ICD-10-CM

## 2011-10-25 DIAGNOSIS — F439 Reaction to severe stress, unspecified: Secondary | ICD-10-CM

## 2011-10-25 DIAGNOSIS — F430 Acute stress reaction: Secondary | ICD-10-CM

## 2011-10-25 DIAGNOSIS — I10 Essential (primary) hypertension: Secondary | ICD-10-CM

## 2011-10-25 DIAGNOSIS — J069 Acute upper respiratory infection, unspecified: Secondary | ICD-10-CM | POA: Insufficient documentation

## 2011-10-25 MED ORDER — GABAPENTIN 600 MG PO TABS: ORAL_TABLET | ORAL | Status: DC

## 2011-10-25 NOTE — Assessment & Plan Note (Signed)
Mild symptoms Not ready for meds yet

## 2011-10-25 NOTE — Progress Notes (Signed)
  Subjective:    Patient ID: Jose [name], male    DOB: 06-22-1923, 78 y.o.   MRN: [id number]  HPI Started with upper respiratory symptoms yesterday Bad drainage into throat No fever Only occ cough--slight mucus No SOB Mild sore throat  Ongoing stress with wife Family did get him to agree to aide twice a week She is incontinent now---more care stress  Voiding fairly well Nocturia x 2  Neck pain continues Gabapentin does control  No chest pain No dizziness or syncope  No edema  Current Outpatient Prescriptions on File Prior to Visit  Medication Sig Dispense Refill  . aspirin 81 MG tablet Take 81 mg by mouth daily.        Marland Kitchen atenolol (TENORMIN) 25 MG tablet TAKE 1 TABLET EVERY DAY  30 tablet  10  . gabapentin (NEURONTIN) 600 MG tablet Take one half by mouth every morning and one by mouth every evening  45 tablet  11  . DISCONTD: atenolol (TENORMIN) 25 MG tablet Take 25 mg by mouth daily.          No Known Allergies  Past Medical History  Diagnosis Date  . GERD (gastroesophageal reflux disease)   . Hypertension   . BPH (benign prostatic hypertrophy)   . Cervical radiculopathy     with neuropathy  . Back problem 1960    Past Surgical History  Procedure Date  . Tonsillectomy   . Cataract extraction     OD    Family History  Problem Relation Age of Onset  . Coronary artery disease Neg Hx   . Diabetes Neg Hx     History   Social History  . Marital Status: Married    Spouse Name: N/A    Number of Children: 2  . Years of Education: N/A   Occupational History  . retired- Merchandiser, retail [employer]    Social History Main Topics  . Smoking status: Never Smoker   . Smokeless tobacco: Never Used  . Alcohol Use: No  . Drug Use: No  . Sexually Active: Not on file   Other Topics Concern  . Not on file   Social History Narrative   Has living willWife is health care POA. Then would want sons. would accept resuscitation but no prolonged ventilationNo feeding  tube if cognitively unaware   Review of Systems Appetite fair Weight down 3# Sleeps okay usually---does wake up when wife is wandering     Objective:   Physical Exam  Constitutional: He appears well-developed and well-nourished.       Appears tired but no distress  HENT:  Mouth/Throat: Oropharynx is clear and moist. No oropharyngeal exudate.       No sinus tenderness TMs normal Mild nasal congestion  Neck: Normal range of motion. Neck supple.  Cardiovascular: Normal rate, regular rhythm and normal heart sounds.  Exam reveals no gallop.   No murmur heard. Pulmonary/Chest: Effort normal and breath sounds normal. No respiratory distress. He has no wheezes. He has no rales.  Musculoskeletal: He exhibits no edema and no tenderness.  Lymphadenopathy:    He has no cervical adenopathy.  Psychiatric: He has a normal mood and affect. His behavior is normal.          Assessment & Plan:

## 2011-10-25 NOTE — Assessment & Plan Note (Signed)
Seems to be typical viral illness Discussed supportive care

## 2011-10-25 NOTE — Assessment & Plan Note (Signed)
Ongoing and probably worse Referred to [facility] DIL and family trying to convince him to take more help Caregiver bill of rights given

## 2011-10-25 NOTE — Assessment & Plan Note (Signed)
BP Readings from Last 3 Encounters:  10/25/11 102/60  06/25/11 128/68  12/11/10 110/60   Control fine Will try off the atenolol

## 2011-10-25 NOTE — Assessment & Plan Note (Signed)
Does okay with the gabapentin 

## 2012-02-24 ENCOUNTER — Encounter: Payer: Self-pay | Admitting: Internal Medicine

## 2012-02-24 ENCOUNTER — Ambulatory Visit (INDEPENDENT_AMBULATORY_CARE_PROVIDER_SITE_OTHER): Payer: Medicare Other | Admitting: Internal Medicine

## 2012-02-24 VITALS — BP 122/70 | HR 74 | Temp 97.7°F | Wt 139.0 lb

## 2012-02-24 DIAGNOSIS — I10 Essential (primary) hypertension: Secondary | ICD-10-CM

## 2012-02-24 DIAGNOSIS — M5412 Radiculopathy, cervical region: Secondary | ICD-10-CM

## 2012-02-24 DIAGNOSIS — M159 Polyosteoarthritis, unspecified: Secondary | ICD-10-CM

## 2012-02-24 DIAGNOSIS — F439 Reaction to severe stress, unspecified: Secondary | ICD-10-CM

## 2012-02-24 DIAGNOSIS — Z23 Encounter for immunization: Secondary | ICD-10-CM

## 2012-02-24 DIAGNOSIS — Z733 Stress, not elsewhere classified: Secondary | ICD-10-CM

## 2012-02-24 NOTE — Addendum Note (Signed)
Addended by: Sueanne Margarita on: 02/24/2012 11:54 AM   Modules accepted: Orders

## 2012-02-24 NOTE — Assessment & Plan Note (Signed)
Does okay with the ibuprofen Can increase dose if needed

## 2012-02-24 NOTE — Assessment & Plan Note (Signed)
Still has some pain Fair control on the gabapentin

## 2012-02-24 NOTE — Assessment & Plan Note (Signed)
BP Readings from Last 3 Encounters:  02/24/12 122/70  10/25/11 102/60  06/25/11 128/68   Bp fine without meds now

## 2012-02-24 NOTE — Progress Notes (Signed)
  Subjective:    Patient ID: Jose [name], male    DOB: 1923-09-12, 78 y.o.   MRN: [id number]  HPI Here with demented wife and DIL Ongoing stress with her care Incontinent regularly (not always) and requires a lot of care---including feeding Has aide only 3 times per day  Sleeping fairly well  Still on the gabapentin This still seems to help with his neck pain  Voids okay Nocturia x 2-3---stable No daytime frequency or urgency  Does have pain in hands and knees Uses ibuprofen 200mg -- 2 a day and this helps  Current Outpatient Prescriptions on File Prior to Visit  Medication Sig Dispense Refill  . aspirin 81 MG tablet Take 81 mg by mouth daily.        Marland Kitchen gabapentin (NEURONTIN) 600 MG tablet Take one half by mouth every morning and one by mouth every evening  45 tablet  11    No Known Allergies  Past Medical History  Diagnosis Date  . GERD (gastroesophageal reflux disease)   . Hypertension   . BPH (benign prostatic hypertrophy)   . Cervical radiculopathy     with neuropathy  . Back problem 1960    Past Surgical History  Procedure Date  . Tonsillectomy   . Cataract extraction     OD    Family History  Problem Relation Age of Onset  . Coronary artery disease Neg Hx   . Diabetes Neg Hx     History   Social History  . Marital Status: Married    Spouse Name: N/A    Number of Children: 2  . Years of Education: N/A   Occupational History  . retired- Merchandiser, retail [employer]    Social History Main Topics  . Smoking status: Never Smoker   . Smokeless tobacco: Never Used  . Alcohol Use: No  . Drug Use: No  . Sexually Active: Not on file   Other Topics Concern  . Not on file   Social History Narrative   Has living willWife is health care POA. Then would want sons. would accept resuscitation but no prolonged ventilationNo feeding tube if cognitively unaware   Review of Systems Feels his appetite is okay Weight is down 4# Has had some itching on his back    Objective:   Physical Exam  Constitutional: He appears well-developed and well-nourished. No distress.  Neck: Normal range of motion. Neck supple. No thyromegaly present.  Cardiovascular: Normal rate, regular rhythm and normal heart sounds.  Exam reveals no gallop.   No murmur heard. Pulmonary/Chest: Effort normal and breath sounds normal. No respiratory distress. He has no wheezes. He has no rales.  Abdominal: Soft. There is no tenderness.  Musculoskeletal: He exhibits no edema and no tenderness.  Lymphadenopathy:    He has no cervical adenopathy.  Skin: No rash noted.       No specific lesions on back  Psychiatric: He has a normal mood and affect. His behavior is normal. Thought content normal.          Assessment & Plan:

## 2012-02-24 NOTE — Assessment & Plan Note (Signed)
Ongoing caregiver stress I urged him to hire more help and may need to look into assisted living

## 2012-02-24 NOTE — Patient Instructions (Signed)
You can take up to 2 tabs of ibuprofen twice a day (total 400mg )

## 2012-10-22 ENCOUNTER — Other Ambulatory Visit: Payer: Self-pay | Admitting: Internal Medicine

## 2012-10-22 MED ORDER — GABAPENTIN 600 MG PO TABS: ORAL_TABLET | ORAL | Status: DC

## 2012-10-26 ENCOUNTER — Ambulatory Visit (INDEPENDENT_AMBULATORY_CARE_PROVIDER_SITE_OTHER): Payer: Medicare Other | Admitting: Internal Medicine

## 2012-10-26 ENCOUNTER — Encounter: Payer: Self-pay | Admitting: Internal Medicine

## 2012-10-26 VITALS — BP 140/70 | HR 79 | Temp 98.5°F | Wt 140.0 lb

## 2012-10-26 DIAGNOSIS — F4321 Adjustment disorder with depressed mood: Secondary | ICD-10-CM | POA: Insufficient documentation

## 2012-10-26 DIAGNOSIS — N400 Enlarged prostate without lower urinary tract symptoms: Secondary | ICD-10-CM

## 2012-10-26 DIAGNOSIS — M5412 Radiculopathy, cervical region: Secondary | ICD-10-CM

## 2012-10-26 DIAGNOSIS — I10 Essential (primary) hypertension: Secondary | ICD-10-CM

## 2012-10-26 DIAGNOSIS — M1710 Unilateral primary osteoarthritis, unspecified knee: Secondary | ICD-10-CM

## 2012-10-26 NOTE — Assessment & Plan Note (Signed)
Seems to be doing as well as can be expected after losing spouse of 67 years Discussed calling grief counselor of hospice again if needed

## 2012-10-26 NOTE — Assessment & Plan Note (Signed)
Not interested in surgery  PROCEDURE Sterile prep to left knee Medial approach Ethyl chloride then 2cc 2% plain lidocaine 40mg  depomedrol and 6cc 2% lidocaine instilled with improvement in pain Discussed home care

## 2012-10-26 NOTE — Progress Notes (Signed)
  Subjective:    Patient ID: Jose [name], male    DOB: [birth year], 79 y.o.   MRN: [id number]  HPI Here for follow up Wife died in 07/22/2022 Has had aftercare from hospice---this has helped Stays busy--- this helps avoid the depression Jose [name], works on his tractors, goes to church every week  Has increased knee pain Hurts with any walking Saw Dr [employer] PA Using ibuprofen--some help Cortisone shots helped in past  Still uses the gabapentin---1 per day (bedtime) For neck pain---had recurred lately   Urine is slow--especially in AM Nocturia x 3  Current Outpatient Prescriptions on File Prior to Visit  Medication Sig Dispense Refill  . aspirin 81 MG tablet Take 81 mg by mouth daily.         No current facility-administered medications on file prior to visit.    No Known Allergies  Past Medical History  Diagnosis Date  . GERD (gastroesophageal reflux disease)   . Hypertension   . BPH (benign prostatic hypertrophy)   . Cervical radiculopathy     with neuropathy  . Back problem 1960    Past Surgical History  Procedure Laterality Date  . Tonsillectomy    . Cataract extraction      OD    Family History  Problem Relation Age of Onset  . Coronary artery disease Neg Hx   . Diabetes Neg Hx     History   Social History  . Marital Status: Widowed    Spouse Name: N/A    Number of Children: 2  . Years of Education: N/A   Occupational History  . retired- Merchandiser, retail [employer]    Social History Main Topics  . Smoking status: Never Smoker   . Smokeless tobacco: Never Used  . Alcohol Use: No  . Drug Use: No  . Sexually Active: Not on file   Other Topics Concern  . Not on file   Social History Narrative   Has living will   Wife is health care POA. Then would want sons.    would accept resuscitation but no prolonged ventilation   No feeding tube if cognitively unaware   Review of Systems Sleeps fair Appetite is okay Weight is stable    Objective:   Physical Exam  Constitutional: He appears well-developed. No distress.  Neck: Normal range of motion. Neck supple. No thyromegaly present.  Cardiovascular: Normal rate and regular rhythm.  Exam reveals no gallop.   Murmur heard. Soft systolic murmur  Pulmonary/Chest: Effort normal and breath sounds normal. No respiratory distress. He has no wheezes. He has no rales.  Abdominal: Soft. There is no tenderness.  Musculoskeletal: He exhibits no edema.  Thickened knees without apparent effusion  Lymphadenopathy:    He has no cervical adenopathy.  Psychiatric:  Sad with slightly constricted affect---not out of context for grieving          Assessment & Plan:

## 2012-10-26 NOTE — Assessment & Plan Note (Signed)
Moderate symptoms but not enough for him to want meds Will try tamsulosin if it worsens

## 2012-10-26 NOTE — Patient Instructions (Addendum)
Please call hospice to speak to the grief counselor if you feel worse. If your left knee feels good after the shot, please call for an appt soon to do the right one

## 2012-10-26 NOTE — Assessment & Plan Note (Signed)
BP Readings from Last 3 Encounters:  10/26/12 140/70  02/24/12 122/70  10/25/11 102/60   Okay without meds

## 2012-10-26 NOTE — Assessment & Plan Note (Signed)
Uses the gabapentin at bedtime

## 2012-11-25 ENCOUNTER — Other Ambulatory Visit: Payer: Self-pay | Admitting: *Deleted

## 2012-11-25 MED ORDER — GABAPENTIN 600 MG PO TABS: 600.0000 mg | ORAL_TABLET | Freq: Every day | ORAL | Status: DC

## 2012-11-25 NOTE — Telephone Encounter (Signed)
rx sent to pharmacy by e-script  

## 2012-11-25 NOTE — Telephone Encounter (Signed)
Okay to refill for a year 

## 2013-02-15 ENCOUNTER — Telehealth: Payer: Self-pay

## 2013-02-15 MED ORDER — TAMSULOSIN HCL 0.4 MG PO CAPS: 0.4000 mg | ORAL_CAPSULE | Freq: Every day | ORAL | Status: DC

## 2013-02-15 NOTE — Telephone Encounter (Signed)
Spoke with daughter and advised results. rx sent to pharmacy by e-script  

## 2013-02-15 NOTE — Telephone Encounter (Signed)
Yes, we can try tamsulosin 0.4 mg daily #30 x 11  Have him call if he has any troubles like dizziness We can review what he thinks at his next regular follow up (make sure he has something scheduled)

## 2013-02-15 NOTE — Telephone Encounter (Signed)
Pt was seen 10/26/12; pt having problems urinating until late afternoon, pt wants to urinate earlier in the day; pt discussed at 10/26/12 but at that time pt did not want medication; now pt is willing to try med ? Tamsulosin. [facility] [address]. Call [place], pts daughter in law because pt cannot hear on phone.

## 2013-02-26 ENCOUNTER — Ambulatory Visit (INDEPENDENT_AMBULATORY_CARE_PROVIDER_SITE_OTHER): Payer: Medicare Other

## 2013-02-26 DIAGNOSIS — Z23 Encounter for immunization: Secondary | ICD-10-CM

## 2013-04-27 ENCOUNTER — Encounter: Payer: Self-pay | Admitting: Internal Medicine

## 2013-04-27 ENCOUNTER — Ambulatory Visit (INDEPENDENT_AMBULATORY_CARE_PROVIDER_SITE_OTHER): Payer: Medicare Other | Admitting: Internal Medicine

## 2013-04-27 VITALS — BP 140/80 | HR 74 | Temp 98.3°F | Wt 146.0 lb

## 2013-04-27 DIAGNOSIS — Z23 Encounter for immunization: Secondary | ICD-10-CM

## 2013-04-27 DIAGNOSIS — M5412 Radiculopathy, cervical region: Secondary | ICD-10-CM

## 2013-04-27 DIAGNOSIS — M1710 Unilateral primary osteoarthritis, unspecified knee: Secondary | ICD-10-CM

## 2013-04-27 DIAGNOSIS — N400 Enlarged prostate without lower urinary tract symptoms: Secondary | ICD-10-CM

## 2013-04-27 DIAGNOSIS — I10 Essential (primary) hypertension: Secondary | ICD-10-CM

## 2013-04-27 LAB — CBC WITH DIFFERENTIAL/PLATELET
Basophils Absolute: 0.1 10*3/uL (ref 0.0–0.1)
Eosinophils Absolute: 0.3 10*3/uL (ref 0.0–0.7)
Hemoglobin: 13.6 g/dL (ref 13.0–17.0)
Lymphocytes Relative: 25.2 % (ref 12.0–46.0)
MCHC: 34 g/dL (ref 30.0–36.0)
Monocytes Relative: 10.8 % (ref 3.0–12.0)
Neutrophils Relative %: 58.1 % (ref 43.0–77.0)
RBC: 4.19 Mil/uL — ABNORMAL LOW (ref 4.22–5.81)
RDW: 14 % (ref 11.5–14.6)

## 2013-04-27 LAB — HEPATIC FUNCTION PANEL
Alkaline Phosphatase: 66 U/L (ref 39–117)
Bilirubin, Direct: 0 mg/dL (ref 0.0–0.3)
Total Bilirubin: 0.7 mg/dL (ref 0.3–1.2)

## 2013-04-27 LAB — BASIC METABOLIC PANEL
CO2: 25 mEq/L (ref 19–32)
Calcium: 9.2 mg/dL (ref 8.4–10.5)
Creatinine, Ser: 0.9 mg/dL (ref 0.4–1.5)
GFR: 80.6 mL/min (ref >60.00)
Glucose, Bld: 94 mg/dL (ref 70–99)
Sodium: 137 mEq/L (ref 135–145)

## 2013-04-27 MED ORDER — TRAMADOL HCL 50 MG PO TABS: 25.0000 mg | ORAL_TABLET | Freq: Three times a day (TID) | ORAL | Status: DC | PRN

## 2013-04-27 NOTE — Addendum Note (Signed)
Addended by: Sueanne Margarita on: 04/27/2013 02:23 PM   Modules accepted: Orders

## 2013-04-27 NOTE — Assessment & Plan Note (Signed)
BP Readings from Last 3 Encounters:  04/27/13 140/80  10/26/12 140/70  02/24/12 122/70   Still okay without meds

## 2013-04-27 NOTE — Progress Notes (Signed)
   Subjective:    Patient ID: Jose [name], male    DOB: 07-21-1923, 79 y.o.   MRN: [id number]  HPI Here for follow up Doing okay except for his knees Left knee is especially bad Uses cane for any extended walking Uses ibuprofen and naproxen---may give a little help  Still on gabapentin Seems to control the ongoing cervical nerve pain Comes back stronger at times but general fair relief  No chest pain No SOB No dizziness or syncope  Voids okay Has seen improvement with the tamsulosin Nocturia x 4  Current Outpatient Prescriptions on File Prior to Visit  Medication Sig Dispense Refill  . aspirin 81 MG tablet Take 81 mg by mouth daily.        Marland Kitchen gabapentin (NEURONTIN) 600 MG tablet Take 1 tablet (600 mg total) by mouth daily.  90 tablet  3  . tamsulosin (FLOMAX) 0.4 MG CAPS capsule Take 1 capsule (0.4 mg total) by mouth daily.  30 capsule  11   No current facility-administered medications on file prior to visit.    No Known Allergies  Past Medical History  Diagnosis Date  . GERD (gastroesophageal reflux disease)   . Hypertension   . BPH (benign prostatic hypertrophy)   . Cervical radiculopathy     with neuropathy  . Back problem 1960    Past Surgical History  Procedure Laterality Date  . Tonsillectomy    . Cataract extraction      OD    Family History  Problem Relation Age of Onset  . Coronary artery disease Neg Hx   . Diabetes Neg Hx     History   Social History  . Marital Status: Widowed    Spouse Name: N/A    Number of Children: 2  . Years of Education: N/A   Occupational History  . retired- Merchandiser, retail [employer]    Social History Main Topics  . Smoking status: Never Smoker   . Smokeless tobacco: Never Used  . Alcohol Use: No  . Drug Use: No  . Sexual Activity: Not on file   Other Topics Concern  . Not on file   Social History Narrative   Has living will   Wife is health care POA. Then would want sons.    would accept resuscitation but  no prolonged ventilation   No feeding tube if cognitively unaware   Review of Systems Sleeps okay despite the nocturia Weight is up about 6# Still grieves his wife but is doing a little better     Objective:   Physical Exam  Constitutional: He appears well-developed and well-nourished. No distress.  Neck: Normal range of motion. Neck supple. No thyromegaly present.  Cardiovascular: Normal rate, regular rhythm, normal heart sounds and intact distal pulses.  Exam reveals no gallop.   No murmur heard. Pulmonary/Chest: Effort normal and breath sounds normal. No respiratory distress. He has no wheezes. He has no rales.  Abdominal: Soft. There is no tenderness.  Musculoskeletal: He exhibits no edema and no tenderness.  Lymphadenopathy:    He has no cervical adenopathy.  Psychiatric: He has a normal mood and affect. His behavior is normal.          Assessment & Plan:

## 2013-04-27 NOTE — Assessment & Plan Note (Signed)
Still gets relief with the gabapentin

## 2013-04-27 NOTE — Assessment & Plan Note (Signed)
Still his limiting factor Cortisone shot didn't really help Will add tramadol for prn

## 2013-04-27 NOTE — Assessment & Plan Note (Signed)
Voids okay on the med 

## 2013-04-29 ENCOUNTER — Encounter: Payer: Self-pay | Admitting: *Deleted

## 2013-04-30 ENCOUNTER — Encounter: Payer: Self-pay | Admitting: *Deleted

## 2013-06-07 ENCOUNTER — Emergency Department: Payer: Self-pay | Admitting: Emergency Medicine

## 2013-11-22 ENCOUNTER — Other Ambulatory Visit: Payer: Self-pay | Admitting: Internal Medicine

## 2014-01-10 ENCOUNTER — Other Ambulatory Visit: Payer: Self-pay | Admitting: Internal Medicine

## 2014-03-09 ENCOUNTER — Ambulatory Visit (INDEPENDENT_AMBULATORY_CARE_PROVIDER_SITE_OTHER): Payer: Medicare Other

## 2014-03-09 DIAGNOSIS — Z23 Encounter for immunization: Secondary | ICD-10-CM

## 2014-04-29 ENCOUNTER — Ambulatory Visit (INDEPENDENT_AMBULATORY_CARE_PROVIDER_SITE_OTHER): Payer: Medicare Other | Admitting: Internal Medicine

## 2014-04-29 ENCOUNTER — Encounter: Payer: Self-pay | Admitting: Internal Medicine

## 2014-04-29 VITALS — BP 140/80 | HR 77 | Temp 98.0°F | Ht 65.0 in | Wt 142.0 lb

## 2014-04-29 DIAGNOSIS — I10 Essential (primary) hypertension: Secondary | ICD-10-CM

## 2014-04-29 DIAGNOSIS — M5412 Radiculopathy, cervical region: Secondary | ICD-10-CM

## 2014-04-29 DIAGNOSIS — N400 Enlarged prostate without lower urinary tract symptoms: Secondary | ICD-10-CM

## 2014-04-29 DIAGNOSIS — Z23 Encounter for immunization: Secondary | ICD-10-CM

## 2014-04-29 DIAGNOSIS — Z0000 Encounter for general adult medical examination without abnormal findings: Secondary | ICD-10-CM

## 2014-04-29 DIAGNOSIS — M159 Polyosteoarthritis, unspecified: Secondary | ICD-10-CM

## 2014-04-29 DIAGNOSIS — Z7189 Other specified counseling: Secondary | ICD-10-CM

## 2014-04-29 DIAGNOSIS — M150 Primary generalized (osteo)arthritis: Secondary | ICD-10-CM

## 2014-04-29 MED ORDER — GABAPENTIN 600 MG PO TABS: 600.0000 mg | ORAL_TABLET | Freq: Every day | ORAL | Status: DC

## 2014-04-29 MED ORDER — GABAPENTIN 600 MG PO TABS: 600.0000 mg | ORAL_TABLET | Freq: Two times a day (BID) | ORAL | Status: DC

## 2014-04-29 MED ORDER — TAMSULOSIN HCL 0.4 MG PO CAPS: 0.4000 mg | ORAL_CAPSULE | Freq: Every day | ORAL | Status: DC

## 2014-04-29 NOTE — Addendum Note (Signed)
Addended by: Despina Hidden on: 04/29/2014 01:14 PM   Modules accepted: Orders, Medications

## 2014-04-29 NOTE — Progress Notes (Signed)
Pre visit review using our clinic review tool, if applicable. No additional management support is needed unless otherwise documented below in the visit note. 

## 2014-04-29 NOTE — Assessment & Plan Note (Signed)
I have personally reviewed the Medicare Annual Wellness questionnaire and have noted 1. The patient's medical and social history 2. Their use of alcohol, tobacco or illicit drugs 3. Their current medications and supplements 4. The patient's functional ability including ADL's, fall risks, home safety risks and hearing or visual             impairment. 5. Diet and physical activities 6. Evidence for depression or mood disorders  The patients weight, height, BMI and visual acuity have been recorded in the chart I have made referrals, counseling and provided education to the patient based review of the above and I have provided the pt with a written personalized care plan for preventive services.  I have provided you with a copy of your personalized plan for preventive services. Please take the time to review along with your updated medication list.  Due for prevnar No cancer screening due to age 80 still---reasonable lifestyle

## 2014-04-29 NOTE — Assessment & Plan Note (Signed)
Reasonable control of symptoms on the med No changes for now

## 2014-04-29 NOTE — Assessment & Plan Note (Signed)
See social history 

## 2014-04-29 NOTE — Assessment & Plan Note (Signed)
Reasonable control with gabapentin Now with leg symptoms at night---will have him try the gabapentin at bedtime also

## 2014-04-29 NOTE — Progress Notes (Signed)
Subjective:    Patient ID: Jose [name], male    DOB: [birth year], 80 y.o.   MRN: [id number]  HPI Here for Medicare wellness and follow up of chronic medical conditions Reviewed form and advanced directives No other doctors No falls but he fears a fall. Walks with cane Vision okay. Hearing is not so good--despite hearing aides No tobacco or alcohol Occasionally walks but no regular exercise Still drives and independent with all instrumental ADLs No major depressed mood---just melancholy being alone. Not anhedonic  Wife died about a year ago He is doing okay but tough at times  Having problems with both knees advil and aleve help but bothers his stomach Occasionally uses tylenol--not as helpful  Still satisfied with the gabapentin Can feel the nerve pain in his neck at times One dose in afternoon controls adequately  Past Rx for BP Has been better lately No chest pain No SOB No dizziness or syncope  Voids okay with the medication Nocturia x 3-4 No sig daytime problems  Current Outpatient Prescriptions on File Prior to Visit  Medication Sig Dispense Refill  . aspirin 81 MG tablet Take 81 mg by mouth daily.       No current facility-administered medications on file prior to visit.    No Known Allergies  Past Medical History  Diagnosis Date  . GERD (gastroesophageal reflux disease)   . Hypertension   . BPH (benign prostatic hypertrophy)   . Cervical radiculopathy     with neuropathy  . Back problem 1960    Past Surgical History  Procedure Laterality Date  . Tonsillectomy    . Cataract extraction      OD    Family History  Problem Relation Age of Onset  . Coronary artery disease Neg Hx   . Diabetes Neg Hx   . Cancer Son     History   Social History  . Marital Status: Widowed    Spouse Name: N/A    Number of Children: 2  . Years of Education: N/A   Occupational History  . 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 Smoking  status: Never Smoker   . Smokeless tobacco: Never Used  . Alcohol Use: No  . Drug Use: No  . Sexual Activity: Not on file   Other Topics Concern  . Not on file   Social History Narrative   Has living will   Son Mikeal Hawthorne is health care POA.     would accept resuscitation but no prolonged ventilation   No feeding tube if cognitively unaware   Review of Systems Gets gas at times Bowels have been okay Sleeps okay--- but is bothered by the nocturia Appetite is only fair---weight down a few pounds Regular visits from family--but 1 son with new diagnosis of cancer Legs twitch and burn at night some    Objective:   Physical Exam  Constitutional: He is oriented to person, place, and time. He appears well-developed and well-nourished. No distress.  HENT:  Mouth/Throat: Oropharynx is clear and moist. No oropharyngeal exudate.  Only 2 lower teeth  Neck: Normal range of motion. Neck supple. No thyromegaly present.  Cardiovascular: Normal heart sounds and intact distal pulses.   No murmur heard. Pulmonary/Chest: Effort normal and breath sounds normal. No respiratory distress. He has no wheezes. He has no rales.  Abdominal: Soft. There is no tenderness.  Musculoskeletal: He exhibits no edema or tenderness.  Lymphadenopathy:    He has no cervical adenopathy.  Neurological: He is alert and oriented to person, place, and time.  President -- "Obama, [place], .... ?" [id number] Recall 3/3  Skin: No rash noted. No erythema.  Psychiatric: He has a normal mood and affect. His behavior is normal.          Assessment & Plan:

## 2014-04-29 NOTE — Assessment & Plan Note (Signed)
Mostly in knees Takes OTC analgesics as needed

## 2014-04-29 NOTE — Assessment & Plan Note (Signed)
BP Readings from Last 3 Encounters:  04/29/14 140/80  04/27/13 140/80  10/26/12 140/70   Still fine without meds Will defer blood work this year

## 2014-04-29 NOTE — Patient Instructions (Signed)
You can take a second gabapentin--at bedtime---if your legs are bothering you.

## 2014-05-02 ENCOUNTER — Telehealth: Payer: Self-pay | Admitting: Internal Medicine

## 2014-05-02 NOTE — Telephone Encounter (Signed)
emmi mailed  °

## 2015-02-15 ENCOUNTER — Ambulatory Visit (INDEPENDENT_AMBULATORY_CARE_PROVIDER_SITE_OTHER): Payer: Medicare Other

## 2015-02-15 DIAGNOSIS — Z23 Encounter for immunization: Secondary | ICD-10-CM | POA: Diagnosis not present

## 2015-03-24 ENCOUNTER — Ambulatory Visit (INDEPENDENT_AMBULATORY_CARE_PROVIDER_SITE_OTHER): Payer: Medicare Other | Admitting: Internal Medicine

## 2015-03-24 ENCOUNTER — Encounter: Payer: Self-pay | Admitting: Internal Medicine

## 2015-03-24 VITALS — BP 162/90 | HR 66 | Temp 97.4°F | Wt 148.5 lb

## 2015-03-24 DIAGNOSIS — M150 Primary generalized (osteo)arthritis: Secondary | ICD-10-CM

## 2015-03-24 DIAGNOSIS — M159 Polyosteoarthritis, unspecified: Secondary | ICD-10-CM

## 2015-03-24 NOTE — Patient Instructions (Signed)
Please try the motrin (ibuprofen) 2 tabs three times daily for the next few weeks. We will review this at your upcoming appointment.

## 2015-03-24 NOTE — Progress Notes (Signed)
Pre visit review using our clinic review tool, if applicable. No additional management support is needed unless otherwise documented below in the visit note. 

## 2015-03-24 NOTE — Progress Notes (Signed)
   Subjective:    Patient ID: Jose [name], male    DOB: 21-Aug-1923, 81 y.o.   MRN: [id number]  HPI Here with daughter Worried about his left knee--thinks he needs TKR  Can barely walk on it now Constant pain Did see Dr Marry Guan in the past--he felt he was ready for TKR Uses ibuprofen--- helps if he takes 2-3, or aleve  Is restricted in activities Hard to use tractor--can't push in the clutch Has 1/2 acre garden Still climbed ladder for shrubs and gutters  No chest pain Sometimes aware of his heart in his chest--- never races No dizziness or syncope No edema No DOE  Current Outpatient Prescriptions on File Prior to Visit  Medication Sig Dispense Refill  . aspirin 81 MG tablet Take 81 mg by mouth daily.      Marland Kitchen gabapentin (NEURONTIN) 600 MG tablet Take 1 tablet (600 mg total) by mouth 2 (two) times daily. 180 tablet 3  . tamsulosin (FLOMAX) 0.4 MG CAPS capsule Take 1 capsule (0.4 mg total) by mouth daily. 90 capsule 3   No current facility-administered medications on file prior to visit.    No Known Allergies  Past Medical History  Diagnosis Date  . GERD (gastroesophageal reflux disease)   . Hypertension   . BPH (benign prostatic hypertrophy)   . Cervical radiculopathy     with neuropathy  . Back problem 1960    Past Surgical History  Procedure Laterality Date  . Tonsillectomy    . Cataract extraction      OD    Family History  Problem Relation Age of Onset  . Coronary artery disease Neg Hx   . Diabetes Neg Hx   . Cancer Son     Social History   Social History  . Marital Status: Widowed    Spouse Name: N/A  . Number of Children: 2  . Years of Education: N/A   Occupational History  . 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 Smoking status: Never Smoker   . Smokeless tobacco: Never Used  . Alcohol Use: No  . Drug Use: No  . Sexual Activity: Not on file   Other Topics Concern  . Not on file   Social History Narrative   Has  living will   Son Mikeal Hawthorne is health care POA.     would accept resuscitation but no prolonged ventilation   No feeding tube if cognitively unaware   Review of Systems  Left shoulder pain for last 2 weeks---after injury doing leaves (emptying trailer) Appetite is okay Some sleep problems Neuropathy in legs--- cold and numbness/pain. Despite gabapentin. Affects his sleep    Objective:   Physical Exam  Constitutional: He appears well-developed. No distress.  Musculoskeletal:  Crepitus in left shoulder but fair ROM Mild tenderness over bursa  Crepitus in left knee Pain with full flexion No ligament or meniscus findings  Neurological:  Gait fairly normal          Assessment & Plan:

## 2015-03-24 NOTE — Assessment & Plan Note (Signed)
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 Discussed he should give up the tractor and ladders. Try ibuprofen regularly for now Okay to proceed with TKR is pain is severe  Mild bursitis in shoulder--discussed ice

## 2015-04-26 ENCOUNTER — Other Ambulatory Visit: Payer: Self-pay | Admitting: Internal Medicine

## 2015-05-03 ENCOUNTER — Ambulatory Visit (INDEPENDENT_AMBULATORY_CARE_PROVIDER_SITE_OTHER): Payer: Medicare Other | Admitting: Internal Medicine

## 2015-05-03 ENCOUNTER — Encounter: Payer: Self-pay | Admitting: Internal Medicine

## 2015-05-03 VITALS — BP 120/80 | HR 69 | Temp 98.2°F | Ht 65.0 in | Wt 150.0 lb

## 2015-05-03 DIAGNOSIS — I10 Essential (primary) hypertension: Secondary | ICD-10-CM | POA: Diagnosis not present

## 2015-05-03 DIAGNOSIS — N400 Enlarged prostate without lower urinary tract symptoms: Secondary | ICD-10-CM

## 2015-05-03 DIAGNOSIS — M170 Bilateral primary osteoarthritis of knee: Secondary | ICD-10-CM | POA: Diagnosis not present

## 2015-05-03 DIAGNOSIS — Z0000 Encounter for general adult medical examination without abnormal findings: Secondary | ICD-10-CM

## 2015-05-03 DIAGNOSIS — M5412 Radiculopathy, cervical region: Secondary | ICD-10-CM | POA: Diagnosis not present

## 2015-05-03 LAB — RENAL FUNCTION PANEL
Albumin: 4 g/dL (ref 3.5–5.2)
BUN: 20 mg/dL (ref 6–23)
CALCIUM: 9.1 mg/dL (ref 8.4–10.5)
CHLORIDE: 108 meq/L (ref 96–112)
CO2: 25 meq/L (ref 19–32)
CREATININE: 0.88 mg/dL (ref 0.40–1.50)
GFR: 86.58 mL/min (ref >60.00)
Glucose, Bld: 79 mg/dL (ref 70–99)
Phosphorus: 3.1 mg/dL (ref 2.3–4.6)
Potassium: 4.5 mEq/L (ref 3.5–5.1)
Sodium: 141 mEq/L (ref 135–145)

## 2015-05-03 LAB — CBC WITH DIFFERENTIAL/PLATELET
BASOS PCT: 0.7 % (ref 0.0–3.0)
Basophils Absolute: 0 10*3/uL (ref 0.0–0.1)
EOS PCT: 7.7 % — AB (ref 0.0–5.0)
Eosinophils Absolute: 0.4 10*3/uL (ref 0.0–0.7)
HEMATOCRIT: 36.4 % — AB (ref 39.0–52.0)
HEMOGLOBIN: 12.3 g/dL — AB (ref 13.0–17.0)
LYMPHS PCT: 22.5 % (ref 12.0–46.0)
Lymphs Abs: 1.2 10*3/uL (ref 0.7–4.0)
MCHC: 33.8 g/dL (ref 30.0–36.0)
MCV: 96.9 fl (ref 78.0–100.0)
MONOS PCT: 11.9 % (ref 3.0–12.0)
Monocytes Absolute: 0.7 10*3/uL (ref 0.1–1.0)
NEUTROS ABS: 3.2 10*3/uL (ref 1.4–7.7)
Neutrophils Relative %: 57.2 % (ref 43.0–77.0)
PLATELETS: 179 10*3/uL (ref 150.0–400.0)
RBC: 3.76 Mil/uL — ABNORMAL LOW (ref 4.22–5.81)
RDW: 15.4 % (ref 11.5–15.5)
WBC: 5.6 10*3/uL (ref 4.0–10.5)

## 2015-05-03 MED ORDER — TRIAMCINOLONE ACETONIDE 0.1 % EX CREA: 1.0000 "application " | TOPICAL_CREAM | Freq: Two times a day (BID) | CUTANEOUS | Status: DC | PRN

## 2015-05-03 MED ORDER — TRAMADOL HCL 50 MG PO TABS: 50.0000 mg | ORAL_TABLET | Freq: Two times a day (BID) | ORAL | Status: DC | PRN

## 2015-05-03 NOTE — Progress Notes (Signed)
Subjective:    Patient ID: Jose [name], male    DOB: 12/11/1923, 81 y.o.   MRN: [id number]  HPI Here for Medicare wellness visit and follow up of chronic medical conditions Reviewed form and advanced directives Reviewed other doctors No alcohol or tobacco Stays physically active Vision is fine Hearing is not great--has aides No falls in past year--uses cane No depression or anhedonia Independent with instrumental ADLs No apparent cognitive problems  Still with bad knee pain Left knee will wake him up at times Mild help with the ibuprofen--- 400 tid Still does housework (has cleaning woman once a month), etc  Neck/arm pain not too pain Sill takes the gabapentin  Voids okay with the medication Nocturia x 2 ---stable No incontinence  No stomach pain No heartburn Takes the ibuprofen with food  No chest pain No palpitations recently No dizziness or syncope. Will get mild symptoms if jumps up from leaning down or turns fast in bed No SOB  Current Outpatient Prescriptions on File Prior to Visit  Medication Sig Dispense Refill  . aspirin 81 MG tablet Take 81 mg by mouth daily.      Marland Kitchen gabapentin (NEURONTIN) 600 MG tablet TAKE 1 TABLET (600 MG TOTAL) BY MOUTH 2 (TWO) TIMES DAILY. 180 tablet 3  . ibuprofen (ADVIL,MOTRIN) 200 MG tablet Take 400 mg by mouth 3 (three) times daily as needed. For knee pain    . tamsulosin (FLOMAX) 0.4 MG CAPS capsule TAKE 1 CAPSULE (0.4 MG TOTAL) BY MOUTH DAILY. 90 capsule 3   No current facility-administered medications on file prior to visit.    No Known Allergies  Past Medical History  Diagnosis Date  . GERD (gastroesophageal reflux disease)   . Hypertension   . BPH (benign prostatic hypertrophy)   . Cervical radiculopathy     with neuropathy  . Back problem 1960    Past Surgical History  Procedure Laterality Date  . Tonsillectomy    . Cataract extraction      OD    Family History  Problem Relation Age of Onset  . Coronary  artery disease Neg Hx   . Diabetes Neg Hx   . Cancer Son     Social History   Social History  . Marital Status: Widowed    Spouse Name: N/A  . Number of Children: 2  . Years of Education: N/A   Occupational History  . 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 Smoking status: Never Smoker   . Smokeless tobacco: Never Used  . Alcohol Use: No  . Drug Use: No  . Sexual Activity: Not on file   Other Topics Concern  . Not on file   Social History Narrative   Has living will   Son Mikeal Hawthorne is health care POA.     would accept resuscitation but no prolonged ventilation   No feeding tube if cognitively unaware   Review of Systems Gets burning spots on legs--worse if he scratches Appetite is fine Weight is up a few pounds Not sleeping well --pain and other things (mind goes) Only 2 teeth--has dentures but doesn't use them Wears seat belt Bowels are okay    Objective:   Physical Exam  Constitutional: He is oriented to person, place, and time. He appears well-developed and well-nourished. No distress.  HENT:  Mouth/Throat: Oropharynx is clear and moist. No oropharyngeal exudate.  No lesions Only 2 lower teeth  Neck: Normal range of motion. Neck supple. No thyromegaly  present.  Cardiovascular: Normal rate, regular rhythm and intact distal pulses.  Exam reveals no gallop.   Gr 1/6 systolic murmur towards apex  Pulmonary/Chest: Effort normal and breath sounds normal. No respiratory distress. He has no wheezes. He has no rales.  Abdominal: Soft. There is no tenderness.  Musculoskeletal: He exhibits no edema.  Lymphadenopathy:    He has no cervical adenopathy.  Neurological: He is alert and oriented to person, place, and time.  President--- "Obama, Tawni Pummel, ?" [phone] [id number] Recall 3/3  Skin: No rash noted. No erythema.  Psychiatric: He has a normal mood and affect. His behavior is normal.          Assessment & Plan:

## 2015-05-03 NOTE — Progress Notes (Signed)
Pre visit review using our clinic review tool, if applicable. No additional management support is needed unless otherwise documented below in the visit note. 

## 2015-05-03 NOTE — Assessment & Plan Note (Signed)
Still bad Will try tramadol for nights Next week will inject both knees

## 2015-05-03 NOTE — Assessment & Plan Note (Signed)
Does okay with the gabapentin 

## 2015-05-03 NOTE — Assessment & Plan Note (Signed)
occ elevated when he has someone check Okay here  BP Readings from Last 3 Encounters:  05/03/15 120/80  03/24/15 162/90  04/29/14 140/80

## 2015-05-03 NOTE — Assessment & Plan Note (Signed)
Voids okay on tamsulosin

## 2015-05-03 NOTE — Assessment & Plan Note (Signed)
I have personally reviewed the Medicare Annual Wellness questionnaire and have noted 1. The patient's medical and social history 2. Their use of alcohol, tobacco or illicit drugs 3. Their current medications and supplements 4. The patient's functional ability including ADL's, fall risks, home safety risks and hearing or visual             impairment. 5. Diet and physical activities 6. Evidence for depression or mood disorders  The patients weight, height, BMI and visual acuity have been recorded in the chart I have made referrals, counseling and provided education to the patient based review of the above and I have provided the pt with a written personalized care plan for preventive services.  I have provided you with a copy of your personalized plan for preventive services. Please take the time to review along with your updated medication list.  No cancer screening due to age UTD on immunizations --no zostavax

## 2015-05-11 ENCOUNTER — Ambulatory Visit (INDEPENDENT_AMBULATORY_CARE_PROVIDER_SITE_OTHER): Payer: Medicare Other | Admitting: Internal Medicine

## 2015-05-11 ENCOUNTER — Encounter: Payer: Self-pay | Admitting: Internal Medicine

## 2015-05-11 VITALS — BP 148/80 | HR 85 | Wt 149.0 lb

## 2015-05-11 DIAGNOSIS — D649 Anemia, unspecified: Secondary | ICD-10-CM

## 2015-05-11 DIAGNOSIS — M170 Bilateral primary osteoarthritis of knee: Secondary | ICD-10-CM | POA: Diagnosis not present

## 2015-05-11 DIAGNOSIS — D638 Anemia in other chronic diseases classified elsewhere: Secondary | ICD-10-CM | POA: Insufficient documentation

## 2015-05-11 NOTE — Progress Notes (Signed)
   Subjective:    Patient ID: Jose [name], male    DOB: 1924-02-22, 81 y.o.   MRN: [id number]  HPI Here with daughter for injections in both knees  Also for review of the recent labs--- anemia which is new He feels the ibuprofen is causing stomach upset Never got the tramadol No change in bowels  Current Outpatient Prescriptions on File Prior to Visit  Medication Sig Dispense Refill  . aspirin 81 MG tablet Take 81 mg by mouth daily.      Marland Kitchen gabapentin (NEURONTIN) 600 MG tablet TAKE 1 TABLET (600 MG TOTAL) BY MOUTH 2 (TWO) TIMES DAILY. 180 tablet 3  . ibuprofen (ADVIL,MOTRIN) 200 MG tablet Take 400 mg by mouth 3 (three) times daily as needed. For knee pain    . tamsulosin (FLOMAX) 0.4 MG CAPS capsule TAKE 1 CAPSULE (0.4 MG TOTAL) BY MOUTH DAILY. 90 capsule 3  . traMADol (ULTRAM) 50 MG tablet Take 1 tablet (50 mg total) by mouth 2 (two) times daily as needed. 60 tablet 0  . triamcinolone cream (KENALOG) 0.1 % Apply 1 application topically 2 (two) times daily as needed. 30 g 0   No current facility-administered medications on file prior to visit.    No Known Allergies  Past Medical History  Diagnosis Date  . GERD (gastroesophageal reflux disease)   . Hypertension   . BPH (benign prostatic hypertrophy)   . Cervical radiculopathy     with neuropathy  . Back problem 1960    Past Surgical History  Procedure Laterality Date  . Tonsillectomy    . Cataract extraction      OD    Family History  Problem Relation Age of Onset  . Coronary artery disease Neg Hx   . Diabetes Neg Hx   . Cancer Son     Social History   Social History  . Marital Status: Widowed    Spouse Name: N/A  . Number of Children: 2  . Years of Education: N/A   Occupational History  . 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 Smoking status: Never Smoker   . Smokeless tobacco: Never Used  . Alcohol Use: No  . Drug Use: No  . Sexual Activity: Not on file   Other Topics Concern  .  Not on file   Social History Narrative   Has living will   Son Mikeal Hawthorne is health care POA.     would accept resuscitation but no prolonged ventilation   No feeding tube if cognitively unaware   Review of Systems Weight stable Appetite is okay    Objective:   Physical Exam  Musculoskeletal:  Crepitus in knees without effusions          Assessment & Plan:

## 2015-05-11 NOTE — Assessment & Plan Note (Signed)
New finding though mild ?gastritis from ibuprofen Discussed with him/daughter in law  Will have him stop the ibuprofen and use tramadol instead Fecal immunoassay in 2 weeks Colonoscopy if positive then

## 2015-05-11 NOTE — Assessment & Plan Note (Signed)
PROCEDURES Bilateral knees medial approach--sterile prep Each with 2cc plain 2% lidocaine then ethyl chloride 40mg  depomedrol/5cc 2% lido into each knee  Tolerated well

## 2015-05-11 NOTE — Patient Instructions (Signed)
Please stop the ibuprofen for 2 full weeks. Then do the fecal test and mail it back. If that shows signs of blood, I would like to set you up for a colonoscopy

## 2015-06-15 ENCOUNTER — Other Ambulatory Visit (INDEPENDENT_AMBULATORY_CARE_PROVIDER_SITE_OTHER): Payer: Medicare Other

## 2015-06-15 DIAGNOSIS — D649 Anemia, unspecified: Secondary | ICD-10-CM

## 2015-06-15 LAB — FECAL OCCULT BLOOD, IMMUNOCHEMICAL: FECAL OCCULT BLD: NEGATIVE

## 2015-06-19 ENCOUNTER — Encounter: Payer: Self-pay | Admitting: *Deleted

## 2016-02-06 ENCOUNTER — Ambulatory Visit (INDEPENDENT_AMBULATORY_CARE_PROVIDER_SITE_OTHER): Payer: Medicare Other

## 2016-02-06 DIAGNOSIS — Z23 Encounter for immunization: Secondary | ICD-10-CM | POA: Diagnosis not present

## 2016-04-18 ENCOUNTER — Other Ambulatory Visit: Payer: Self-pay | Admitting: Internal Medicine

## 2016-05-07 ENCOUNTER — Encounter: Payer: Self-pay | Admitting: Internal Medicine

## 2016-05-07 ENCOUNTER — Ambulatory Visit (INDEPENDENT_AMBULATORY_CARE_PROVIDER_SITE_OTHER): Payer: Medicare Other | Admitting: Internal Medicine

## 2016-05-07 VITALS — BP 132/88 | HR 68 | Temp 97.5°F | Ht 66.5 in | Wt 148.0 lb

## 2016-05-07 DIAGNOSIS — M170 Bilateral primary osteoarthritis of knee: Secondary | ICD-10-CM | POA: Diagnosis not present

## 2016-05-07 DIAGNOSIS — M5412 Radiculopathy, cervical region: Secondary | ICD-10-CM | POA: Diagnosis not present

## 2016-05-07 DIAGNOSIS — Z0000 Encounter for general adult medical examination without abnormal findings: Secondary | ICD-10-CM | POA: Diagnosis not present

## 2016-05-07 DIAGNOSIS — G629 Polyneuropathy, unspecified: Secondary | ICD-10-CM

## 2016-05-07 DIAGNOSIS — N138 Other obstructive and reflux uropathy: Secondary | ICD-10-CM

## 2016-05-07 DIAGNOSIS — I10 Essential (primary) hypertension: Secondary | ICD-10-CM | POA: Diagnosis not present

## 2016-05-07 DIAGNOSIS — R200 Anesthesia of skin: Secondary | ICD-10-CM

## 2016-05-07 DIAGNOSIS — Z7189 Other specified counseling: Secondary | ICD-10-CM

## 2016-05-07 DIAGNOSIS — N401 Enlarged prostate with lower urinary tract symptoms: Secondary | ICD-10-CM

## 2016-05-07 LAB — CBC WITH DIFFERENTIAL/PLATELET
Basophils Absolute: 0 10*3/uL (ref 0.0–0.1)
Basophils Relative: 0.8 % (ref 0.0–3.0)
EOS PCT: 5.5 % — AB (ref 0.0–5.0)
Eosinophils Absolute: 0.3 10*3/uL (ref 0.0–0.7)
HCT: 38.2 % — ABNORMAL LOW (ref 39.0–52.0)
Hemoglobin: 13.2 g/dL (ref 13.0–17.0)
Lymphocytes Relative: 19.5 % (ref 12.0–46.0)
Lymphs Abs: 1.1 10*3/uL (ref 0.7–4.0)
MCHC: 34.5 g/dL (ref 30.0–36.0)
MCV: 95.8 fl (ref 78.0–100.0)
MONO ABS: 0.7 10*3/uL (ref 0.1–1.0)
MONOS PCT: 11.6 % (ref 3.0–12.0)
NEUTROS ABS: 3.5 10*3/uL (ref 1.4–7.7)
NEUTROS PCT: 62.6 % (ref 43.0–77.0)
PLATELETS: 158 10*3/uL (ref 150.0–400.0)
RBC: 3.98 Mil/uL — AB (ref 4.22–5.81)
RDW: 15.4 % (ref 11.5–15.5)
WBC: 5.7 10*3/uL (ref 4.0–10.5)

## 2016-05-07 LAB — COMPREHENSIVE METABOLIC PANEL
ALT: 13 U/L (ref 0–53)
AST: 11 U/L (ref 0–37)
Albumin: 4.3 g/dL (ref 3.5–5.2)
Alkaline Phosphatase: 72 U/L (ref 39–117)
BUN: 22 mg/dL (ref 6–23)
CHLORIDE: 107 meq/L (ref 96–112)
CO2: 27 meq/L (ref 19–32)
Calcium: 9.5 mg/dL (ref 8.4–10.5)
Creatinine, Ser: 0.95 mg/dL (ref 0.40–1.50)
GFR: 79.08 mL/min (ref >60.00)
GLUCOSE: 87 mg/dL (ref 70–99)
POTASSIUM: 4.6 meq/L (ref 3.5–5.1)
SODIUM: 140 meq/L (ref 135–145)
TOTAL PROTEIN: 6.8 g/dL (ref 6.0–8.3)
Total Bilirubin: 0.6 mg/dL (ref 0.2–1.2)

## 2016-05-07 LAB — T4, FREE: Free T4: 0.84 ng/dL (ref 0.60–1.60)

## 2016-05-07 LAB — VITAMIN B12: VITAMIN B 12: 208 pg/mL — AB (ref 211–911)

## 2016-05-07 MED ORDER — KETOCONAZOLE 2 % EX CREA: 1.0000 "application " | TOPICAL_CREAM | Freq: Two times a day (BID) | CUTANEOUS | 1 refills | Status: DC | PRN

## 2016-05-07 NOTE — Assessment & Plan Note (Signed)
I have personally reviewed the Medicare Annual Wellness questionnaire and have noted 1. The patient's medical and social history 2. Their use of alcohol, tobacco or illicit drugs 3. Their current medications and supplements 4. The patient's functional ability including ADL's, fall risks, home safety risks and hearing or visual             impairment. 5. Diet and physical activities 6. Evidence for depression or mood disorders  The patients weight, height, BMI and visual acuity have been recorded in the chart I have made referrals, counseling and provided education to the patient based review of the above and I have provided the pt with a written personalized care plan for preventive services.  I have provided you with a copy of your personalized plan for preventive services. Please take the time to review along with your updated medication list.  No cancer screening due to age UTD with imms Keeps fairly active

## 2016-05-07 NOTE — Assessment & Plan Note (Signed)
Will check labs ?related to anemia (RLS variant) Discussed iron

## 2016-05-07 NOTE — Progress Notes (Signed)
Subjective:    Patient ID: Jose [name], male    DOB: 09/24/1923, 82 y.o.   MRN: [id number]  HPI Here for Medicare wellness and follow up of chronic health conditions Reviewed form and advanced directives Reviewed other doctors No alcohol and no tobacco now Vision is okay Poor hearing--wears aides Stays active and walks regularly No falls No depression or anhedonia Independent with instrumental ADLs  Doing fairly well Not as much yard work but does his instrumental ADLs  Having trouble with his knees Wants cortisone shots Some hep with ibuprofen--- 200 mg tid  Gets burning in legs Keeps him up at night Uses the gabapentin for his cervical radiculopathy  Voids okay Nocturia x 3-4 No major dribbling  No heartburn  No dysphagia No meds for this  Has rash on left calf Tried clotrimazole per pharmacist Burns and hasn't gone away  No chest pain No SOB No dizziness or syncope  Current Outpatient Prescriptions on File Prior to Visit  Medication Sig Dispense Refill  . aspirin 81 MG tablet Take 81 mg by mouth daily.      Marland Kitchen gabapentin (NEURONTIN) 600 MG tablet TAKE 1 TABLET (600 MG TOTAL) BY MOUTH 2 (TWO) TIMES DAILY. 180 tablet 0  . ibuprofen (ADVIL,MOTRIN) 200 MG tablet Take 200-400 mg by mouth 3 (three) times daily as needed. For knee pain     . tamsulosin (FLOMAX) 0.4 MG CAPS capsule TAKE 1 CAPSULE (0.4 MG TOTAL) BY MOUTH DAILY. 90 capsule 0  . triamcinolone cream (KENALOG) 0.1 % Apply 1 application topically 2 (two) times daily as needed. 30 g 0   No current facility-administered medications on file prior to visit.     No Known Allergies  Past Medical History:  Diagnosis Date  . Back problem 1960  . BPH (benign prostatic hypertrophy)   . Cervical radiculopathy    with neuropathy  . GERD (gastroesophageal reflux disease)   . Hypertension     Past Surgical History:  Procedure Laterality Date  . CATARACT EXTRACTION     OD  . TONSILLECTOMY       Family History  Problem Relation Age of Onset  . Cancer Son   . Coronary artery disease Neg Hx   . Diabetes Neg Hx     Social History   Social History  . Marital status: Widowed    Spouse name: N/A  . Number of children: 2  . Years of education: N/A   Occupational History  . 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 Smoking status: Never Smoker  . Smokeless tobacco: Never Used  . Alcohol use No  . Drug use: No  . Sexual activity: Not on file   Other Topics Concern  . Not on file   Social History Narrative   Has living will   [name] is health care POA.     Requests DNR--done 05/07/16   No feeding tube if cognitively unaware   Review of Systems Occasional headache Appetite is good Weight stable Sleep problems due to the leg burning and jumping Bowels are okay. No bloody or black stool Wears seat belt Only 2 lower teeth--no problems with them. Has dentures but doesn't wear them    Objective:   Physical Exam  Constitutional: He is oriented to person, place, and time. He appears well-nourished. No distress.  HENT:  Mouth/Throat: Oropharynx is clear and moist. No oropharyngeal exudate.  Neck: Normal range of motion. Neck supple. No thyromegaly present.  Cardiovascular: Normal rate, regular rhythm, normal heart sounds and intact distal pulses.   No murmur heard. Pulmonary/Chest: Effort normal and breath sounds normal. No respiratory distress. He has no wheezes. He has no rales.  Abdominal: Soft. There is no tenderness.  Musculoskeletal: He exhibits no edema.  Lymphadenopathy:    He has no cervical adenopathy.  Neurological: He is alert and oriented to person, place, and time.  President-- "Trump, Obama, Clinton---Bush" [phone] Recall 3/3 D-l-o-r-w  Skin:  Triangular lesion on left calf with central clearing and raised borders (antifungal Rx sent)  Psychiatric: He has a normal mood and affect. His behavior is normal.           Assessment & Plan:

## 2016-05-07 NOTE — Assessment & Plan Note (Signed)
BP Readings from Last 3 Encounters:  05/07/16 132/88  05/11/15 (!) 148/80  05/03/15 120/80   BP is fine now

## 2016-05-07 NOTE — Progress Notes (Signed)
Pre visit review using our clinic review tool, if applicable. No additional management support is needed unless otherwise documented below in the visit note. 

## 2016-05-07 NOTE — Patient Instructions (Addendum)
Please try taking an over the counter iron tablet at bedtime. You can try glucosamine and chondroitin over the counter to see if it helps your knees. If no help after 1-2 months, you can stop it. If you have ongoing knee pain, you can call for an appointment to have your knees injected.

## 2016-05-07 NOTE — Assessment & Plan Note (Signed)
On the gabapentin

## 2016-05-07 NOTE — Assessment & Plan Note (Signed)
Mild symptoms only on dual therapy

## 2016-05-07 NOTE — Assessment & Plan Note (Signed)
DNR order done today

## 2016-05-07 NOTE — Assessment & Plan Note (Signed)
Discussed glucosamine/chondroitin Will inject again if needed

## 2016-05-08 ENCOUNTER — Other Ambulatory Visit: Payer: Self-pay | Admitting: Internal Medicine

## 2016-05-08 DIAGNOSIS — E538 Deficiency of other specified B group vitamins: Secondary | ICD-10-CM

## 2016-07-11 ENCOUNTER — Encounter: Payer: Self-pay | Admitting: Internal Medicine

## 2016-07-11 ENCOUNTER — Ambulatory Visit (INDEPENDENT_AMBULATORY_CARE_PROVIDER_SITE_OTHER): Payer: Medicare Other | Admitting: Internal Medicine

## 2016-07-11 DIAGNOSIS — M170 Bilateral primary osteoarthritis of knee: Secondary | ICD-10-CM | POA: Diagnosis not present

## 2016-07-11 MED ORDER — METHYLPREDNISOLONE ACETATE 40 MG/ML IJ SUSP
80.0000 mg | Freq: Once | INTRAMUSCULAR | Status: AC
  Administered 2016-07-11: 80 mg via INTRA_ARTICULAR

## 2016-07-11 NOTE — Assessment & Plan Note (Signed)
Procedure  Bilateral knees Medial approachi Ethyl chloride and 2cc 2% lidocaine after sterile prep Injected 40mg  depomedrol and 5cc 2% lidocaine in each knee Marked osteophytes on left but able to get into the joint space eventually Tolerated well Discussed home care

## 2016-07-11 NOTE — Addendum Note (Signed)
Addended by: Pilar Grammes on: 07/11/2016 04:53 PM   Modules accepted: Orders

## 2016-07-11 NOTE — Progress Notes (Signed)
Pre visit review using our clinic review tool, if applicable. No additional management support is needed unless otherwise documented below in the visit note. 

## 2016-07-11 NOTE — Progress Notes (Signed)
   Subjective:    Patient ID: Jose [name], male    DOB: 11-24-1923, 83 y.o.   MRN: [id number]  HPI Here with daughter Worsening pain and trouble walking and doing his instrumental ADLs  Current Outpatient Prescriptions on File Prior to Visit  Medication Sig Dispense Refill  . aspirin 81 MG tablet Take 81 mg by mouth daily.      Marland Kitchen gabapentin (NEURONTIN) 600 MG tablet TAKE 1 TABLET (600 MG TOTAL) BY MOUTH 2 (TWO) TIMES DAILY. 180 tablet 0  . ibuprofen (ADVIL,MOTRIN) 200 MG tablet Take 200-400 mg by mouth 3 (three) times daily as needed. For knee pain     . ketoconazole (NIZORAL) 2 % cream Apply 1 application topically 2 (two) times daily as needed for irritation. 60 g 1  . tamsulosin (FLOMAX) 0.4 MG CAPS capsule TAKE 1 CAPSULE (0.4 MG TOTAL) BY MOUTH DAILY. 90 capsule 0  . triamcinolone cream (KENALOG) 0.1 % Apply 1 application topically 2 (two) times daily as needed. 30 g 0   No current facility-administered medications on file prior to visit.     No Known Allergies  Past Medical History:  Diagnosis Date  . Back problem 1960  . BPH (benign prostatic hypertrophy)   . Cervical radiculopathy    with neuropathy  . GERD (gastroesophageal reflux disease)   . Hypertension     Past Surgical History:  Procedure Laterality Date  . CATARACT EXTRACTION     OD  . TONSILLECTOMY      Family History  Problem Relation Age of Onset  . Cancer Son   . Coronary artery disease Neg Hx   . Diabetes Neg Hx     Social History   Social History  . Marital status: Widowed    Spouse name: N/A  . Number of children: 2  . Years of education: N/A   Occupational History  . 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 Smoking status: Never Smoker  . Smokeless tobacco: Never Used  . Alcohol use No  . Drug use: No  . Sexual activity: Not on file   Other Topics Concern  . Not on file   Social History Narrative   Has living will   Son Mikeal Hawthorne is health care  POA.     Requests DNR--done 05/07/16   No feeding tube if cognitively unaware      Review of Systems     Objective:   Physical Exam        Assessment & Plan:

## 2016-07-30 ENCOUNTER — Other Ambulatory Visit: Payer: Self-pay | Admitting: Internal Medicine

## 2016-08-13 ENCOUNTER — Telehealth: Payer: Self-pay | Admitting: Internal Medicine

## 2016-08-13 NOTE — Telephone Encounter (Signed)
Spoke to pt's son. He will check about his Dad's Vit B.

## 2016-08-13 NOTE — Telephone Encounter (Signed)
Jose [name], son, returned your call. Please call [phone]. Son states pt can not hear well  Thanks

## 2016-08-14 NOTE — Telephone Encounter (Signed)
Sam, son, returned your call with information.  Please call [phone]

## 2016-08-14 NOTE — Telephone Encounter (Signed)
Spoke to Sam. He said his Dad is on the B12. I made him a lab visit for 08-20-16

## 2016-08-20 ENCOUNTER — Other Ambulatory Visit (INDEPENDENT_AMBULATORY_CARE_PROVIDER_SITE_OTHER): Payer: Medicare Other

## 2016-08-20 DIAGNOSIS — E538 Deficiency of other specified B group vitamins: Secondary | ICD-10-CM | POA: Diagnosis not present

## 2016-08-20 LAB — VITAMIN B12: Vitamin B-12: 723 pg/mL (ref 211–911)

## 2016-09-05 ENCOUNTER — Encounter: Payer: Self-pay | Admitting: Internal Medicine

## 2016-09-05 ENCOUNTER — Ambulatory Visit (INDEPENDENT_AMBULATORY_CARE_PROVIDER_SITE_OTHER): Payer: Medicare Other | Admitting: Internal Medicine

## 2016-09-05 VITALS — BP 126/74 | HR 75 | Temp 97.9°F | Wt 147.5 lb

## 2016-09-05 DIAGNOSIS — R21 Rash and other nonspecific skin eruption: Secondary | ICD-10-CM | POA: Diagnosis not present

## 2016-09-05 MED ORDER — TRIAMCINOLONE ACETONIDE 0.1 % EX CREA: 1.0000 "application " | TOPICAL_CREAM | Freq: Two times a day (BID) | CUTANEOUS | 1 refills | Status: DC | PRN

## 2016-09-05 NOTE — Progress Notes (Signed)
Pre visit review using our clinic review tool, if applicable. No additional management support is needed unless otherwise documented below in the visit note. 

## 2016-09-05 NOTE — Patient Instructions (Signed)
Please use the ketoconazole cream once a day on the rash.  Use the triamcinolone cream 2-3 times a day as needed for the itching. If this doesn't go away over the next 1-2 months, you should see a dermatologist.

## 2016-09-05 NOTE — Progress Notes (Signed)
   Subjective:    Patient ID: Jose [name], male    DOB: 04/29/1924, 83 y.o.   MRN: [id number]  HPI Here with granddaughter  Having ongoing rash on left calf---ringworm Ketoconazole didn't help  Also slipped and cut leg---2 weeks ago Healing okay--some better lately  Gets burning in the ringworm area May improve--then worsens  Current Outpatient Prescriptions on File Prior to Visit  Medication Sig Dispense Refill  . aspirin 81 MG tablet Take 81 mg by mouth daily.      Marland Kitchen gabapentin (NEURONTIN) 600 MG tablet TAKE 1 TABLET (600 MG TOTAL) BY MOUTH 2 (TWO) TIMES DAILY. 180 tablet 3  . ibuprofen (ADVIL,MOTRIN) 200 MG tablet Take 200-400 mg by mouth 3 (three) times daily as needed. For knee pain     . ketoconazole (NIZORAL) 2 % cream Apply 1 application topically 2 (two) times daily as needed for irritation. 60 g 1  . tamsulosin (FLOMAX) 0.4 MG CAPS capsule TAKE 1 CAPSULE (0.4 MG TOTAL) BY MOUTH DAILY. 90 capsule 3  . triamcinolone cream (KENALOG) 0.1 % Apply 1 application topically 2 (two) times daily as needed. 30 g 0  . vitamin B-12 (CYANOCOBALAMIN) 500 MCG tablet Take 500 mcg by mouth daily.     No current facility-administered medications on file prior to visit.     No Known Allergies  Past Medical History:  Diagnosis Date  . Back problem 1960  . BPH (benign prostatic hypertrophy)   . Cervical radiculopathy    with neuropathy  . GERD (gastroesophageal reflux disease)   . Hypertension     Past Surgical History:  Procedure Laterality Date  . CATARACT EXTRACTION     OD  . TONSILLECTOMY      Family History  Problem Relation Age of Onset  . Cancer Son   . Coronary artery disease Neg Hx   . Diabetes Neg Hx     Social History   Social History  . Marital status: Widowed    Spouse name: N/A  . Number of children: 2  . Years of education: N/A   Occupational History  . 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 Smoking status: Never  Smoker  . Smokeless tobacco: Never Used  . Alcohol use No  . Drug use: No  . Sexual activity: Not on file   Other Topics Concern  . Not on file   Social History Narrative   Has living will   Son Mikeal Hawthorne is health care POA.     Requests DNR--done 05/07/16   No feeding tube if cognitively unaware   Review of Systems  No fever Feels okay     Objective:   Physical Exam  Skin:  Small ulceration (injury) which is not infected (he is using bacitracin on this)  Prior rash is still about 2.5cm, irregularly shaped No longer has raised borders ?change in texture in the middle          Assessment & Plan:

## 2016-09-05 NOTE — Assessment & Plan Note (Signed)
Looks different Could be tinea corporis that is partially treated (and just itching) ??morphea or other lesion  Will try TAC and the ketoconazole Derm eval if persists

## 2016-10-28 ENCOUNTER — Ambulatory Visit: Payer: Medicare Other | Admitting: Internal Medicine

## 2016-12-18 DIAGNOSIS — D039 Melanoma in situ, unspecified: Secondary | ICD-10-CM

## 2016-12-18 HISTORY — DX: Melanoma in situ, unspecified: D03.9

## 2016-12-18 HISTORY — PX: MELANOMA EXCISION: SHX5266

## 2016-12-31 ENCOUNTER — Encounter: Payer: Self-pay | Admitting: Internal Medicine

## 2016-12-31 DIAGNOSIS — C434 Malignant melanoma of scalp and neck: Secondary | ICD-10-CM | POA: Insufficient documentation

## 2017-02-27 ENCOUNTER — Ambulatory Visit (INDEPENDENT_AMBULATORY_CARE_PROVIDER_SITE_OTHER): Payer: Medicare Other

## 2017-02-27 DIAGNOSIS — Z23 Encounter for immunization: Secondary | ICD-10-CM | POA: Diagnosis not present

## 2017-05-07 ENCOUNTER — Ambulatory Visit (INDEPENDENT_AMBULATORY_CARE_PROVIDER_SITE_OTHER): Payer: Medicare Other | Admitting: Internal Medicine

## 2017-05-07 ENCOUNTER — Encounter: Payer: Self-pay | Admitting: Internal Medicine

## 2017-05-07 VITALS — BP 124/84 | HR 73 | Temp 97.5°F | Ht 66.5 in | Wt 143.5 lb

## 2017-05-07 DIAGNOSIS — C434 Malignant melanoma of scalp and neck: Secondary | ICD-10-CM | POA: Diagnosis not present

## 2017-05-07 DIAGNOSIS — M150 Primary generalized (osteo)arthritis: Secondary | ICD-10-CM

## 2017-05-07 DIAGNOSIS — I10 Essential (primary) hypertension: Secondary | ICD-10-CM | POA: Diagnosis not present

## 2017-05-07 DIAGNOSIS — Z23 Encounter for immunization: Secondary | ICD-10-CM

## 2017-05-07 DIAGNOSIS — Z0000 Encounter for general adult medical examination without abnormal findings: Secondary | ICD-10-CM

## 2017-05-07 DIAGNOSIS — N138 Other obstructive and reflux uropathy: Secondary | ICD-10-CM

## 2017-05-07 DIAGNOSIS — G629 Polyneuropathy, unspecified: Secondary | ICD-10-CM | POA: Diagnosis not present

## 2017-05-07 DIAGNOSIS — Z7189 Other specified counseling: Secondary | ICD-10-CM | POA: Diagnosis not present

## 2017-05-07 DIAGNOSIS — M159 Polyosteoarthritis, unspecified: Secondary | ICD-10-CM

## 2017-05-07 DIAGNOSIS — N401 Enlarged prostate with lower urinary tract symptoms: Secondary | ICD-10-CM

## 2017-05-07 LAB — COMPREHENSIVE METABOLIC PANEL
ALT: 14 U/L (ref 0–53)
AST: 12 U/L (ref 0–37)
Albumin: 4.3 g/dL (ref 3.5–5.2)
Alkaline Phosphatase: 73 U/L (ref 39–117)
BILIRUBIN TOTAL: 0.7 mg/dL (ref 0.2–1.2)
BUN: 17 mg/dL (ref 6–23)
CO2: 27 meq/L (ref 19–32)
CREATININE: 0.93 mg/dL (ref 0.40–1.50)
Calcium: 9.3 mg/dL (ref 8.4–10.5)
Chloride: 106 mEq/L (ref 96–112)
GFR: 80.86 mL/min (ref >60.00)
GLUCOSE: 79 mg/dL (ref 70–99)
Potassium: 4.3 mEq/L (ref 3.5–5.1)
Sodium: 140 mEq/L (ref 135–145)
Total Protein: 7.1 g/dL (ref 6.0–8.3)

## 2017-05-07 LAB — CBC
HCT: 40.8 % (ref 39.0–52.0)
Hemoglobin: 13.9 g/dL (ref 13.0–17.0)
MCHC: 34 g/dL (ref 30.0–36.0)
MCV: 100.1 fl — ABNORMAL HIGH (ref 78.0–100.0)
Platelets: 164 10*3/uL (ref 150.0–400.0)
RBC: 4.08 Mil/uL — ABNORMAL LOW (ref 4.22–5.81)
RDW: 16.7 % — ABNORMAL HIGH (ref 11.5–15.5)
WBC: 6.6 10*3/uL (ref 4.0–10.5)

## 2017-05-07 MED ORDER — KETOCONAZOLE 2 % EX CREA: 1.0000 "application " | TOPICAL_CREAM | Freq: Two times a day (BID) | CUTANEOUS | 1 refills | Status: DC | PRN

## 2017-05-07 NOTE — Assessment & Plan Note (Signed)
Still gets some relief from the gabapentin

## 2017-05-07 NOTE — Assessment & Plan Note (Signed)
I have personally reviewed the Medicare Annual Wellness questionnaire and have noted 1. The patient's medical and social history 2. Their use of alcohol, tobacco or illicit drugs 3. Their current medications and supplements 4. The patient's functional ability including ADL's, fall risks, home safety risks and hearing or visual             impairment. 5. Diet and physical activities 6. Evidence for depression or mood disorders  The patients weight, height, BMI and visual acuity have been recorded in the chart I have made referrals, counseling and provided education to the patient based review of the above and I have provided the pt with a written personalized care plan for preventive services.  I have provided you with a copy of your personalized plan for preventive services. Please take the time to review along with your updated medication list.  Discussed fitness and safety Pneumovax booster No cancer screening

## 2017-05-07 NOTE — Progress Notes (Signed)
Subjective:    Patient ID: Jose [name], male    DOB: Jun 17, 1923, 83 y.o.   MRN: [id number]  HPI Here for Medicare wellness visit and follow up of chronic health conditions Reviewed form and advanced directives Reviewed other doctors No alcohol or tobacco Stays active with walking Vision okay---preferred no screening Wears hearing aides that help Had melanoma removed from neck by Dr Evorn Gong. Has healed well. Also BCC No falls Independent with instrumental ADLs No major memory problems  Lots of aches and pains Right shoulder especially bad--keeping him up at night Doesn't remember any injury Ibuprofen/BC's not helping Also with hand pain--across dorsum of both hands  Ongoing neuropathy pain Continues on the gabapentin  Still with urinary problems Not helping as much as before Nocturia x 2-3  Slower flow--but he feels he is emptying completely  No chest pain No SOB No dizziness or syncope No edema  Takes the B12 daily  Current Outpatient Medications on File Prior to Visit  Medication Sig Dispense Refill  . aspirin 81 MG tablet Take 81 mg by mouth daily.      Marland Kitchen gabapentin (NEURONTIN) 600 MG tablet TAKE 1 TABLET (600 MG TOTAL) BY MOUTH 2 (TWO) TIMES DAILY. 180 tablet 3  . ibuprofen (ADVIL,MOTRIN) 200 MG tablet Take 200-400 mg by mouth 3 (three) times daily as needed. For knee pain     . ketoconazole (NIZORAL) 2 % cream Apply 1 application topically 2 (two) times daily as needed for irritation. 60 g 1  . tamsulosin (FLOMAX) 0.4 MG CAPS capsule TAKE 1 CAPSULE (0.4 MG TOTAL) BY MOUTH DAILY. 90 capsule 3  . triamcinolone cream (KENALOG) 0.1 % Apply 1 application topically 2 (two) times daily as needed. 30 g 1  . vitamin B-12 (CYANOCOBALAMIN) 500 MCG tablet Take 500 mcg by mouth daily.     No current facility-administered medications on file prior to visit.     No Known Allergies  Past Medical History:  Diagnosis Date  . Back problem 1960  . BPH (benign  prostatic hypertrophy)   . Cervical radiculopathy    with neuropathy  . GERD (gastroesophageal reflux disease)   . Hypertension   . Melanoma in situ ([place]) 12/2016   neck    Past Surgical History:  Procedure Laterality Date  . CATARACT EXTRACTION     OD  . MELANOMA EXCISION  12/2016   in situ---Dr Dasher  . TONSILLECTOMY      Family History  Problem Relation Age of Onset  . Cancer Son   . Coronary artery disease Neg Hx   . Diabetes Neg Hx     Social History   Socioeconomic History  . Marital status: Widowed    Spouse name: Not on file  . Number of children: 2  . Years of education: Not on file  . Highest education level: Not on file  Social Needs  . Financial resource strain: Not on file  . Food insecurity - worry: Not on file  . Food insecurity - inability: Not on file  . Transportation needs - medical: Not on file  . Transportation needs - non-medical: Not on file  Occupational History  . Occupation: retiredOccupational psychologist [employer]    Employer: RETIRED  Tobacco Use  . Smoking status: Never Smoker  . Smokeless tobacco: Never Used  Substance and Sexual Activity  . Alcohol use: No  . Drug use: No  . Sexual activity: Not on file  Other Topics Concern  . Not on file  Social History Narrative   Has living will   Son Mikeal Hawthorne is health care POA.     Requests DNR--done 05/07/16   No feeding tube if cognitively unaware   Review of Systems Appetite is okay Weight is stable Sleep is variable--not great. No daytime somnolence Some vague weakness--uses cane No heartburn or dysphagia Only 2 lower teeth--- doesn't wear his dentures Wears seat belt Bowels are okay. No blood Circular itchy rash on left leg     Objective:   Physical Exam  Constitutional: He is oriented to person, place, and time. No distress.  HENT:  Mouth/Throat: Oropharynx is clear and moist. No oropharyngeal exudate.  Neck: No thyromegaly present.  Cardiovascular: Normal rate, regular rhythm and  intact distal pulses. Exam reveals no gallop.  Soft systolic murmur along LLSB  Pulmonary/Chest: Effort normal and breath sounds normal. No respiratory distress. He has no wheezes. He has no rales.  Abdominal: Soft. He exhibits no distension. There is no tenderness. There is no rebound and no guarding.  Musculoskeletal:  Mild pain in right shoulder with abduction to 90 degrees ROM fair Hands with no active synovitis  Neurological: He is alert and oriented to person, place, and time.  President-- "Trump, ?" [phone] or 7 Recall 3/3 [id number]  Skin:  Right neck site healed Slightly raised half circular lesion on left calf (ringworm?)          Assessment & Plan:

## 2017-05-07 NOTE — Assessment & Plan Note (Signed)
BP Readings from Last 3 Encounters:  05/07/17 124/84  09/05/16 126/74  07/11/16 140/82   Still okay without Rx

## 2017-05-07 NOTE — Assessment & Plan Note (Signed)
Knees better Shoulders, hands Uses NSAIDs---but not daily (discussed safety with this)

## 2017-05-07 NOTE — Assessment & Plan Note (Signed)
Recent excision Has healed well No evidence of recurrence at this point

## 2017-05-07 NOTE — Assessment & Plan Note (Signed)
Slower stream but goes okay Continue med

## 2017-05-07 NOTE — Addendum Note (Signed)
Addended by: Modena Nunnery on: 05/07/2017 12:55 PM   Modules accepted: Orders

## 2017-05-07 NOTE — Assessment & Plan Note (Signed)
Has DNR 

## 2017-05-07 NOTE — Progress Notes (Signed)
Hearing Screening Comments: Wearing hearing aids Vision Screening Comments: Pt declined; states he &quot;sees pretty good&quot;

## 2017-07-19 ENCOUNTER — Other Ambulatory Visit: Payer: Self-pay | Admitting: Internal Medicine

## 2017-07-28 ENCOUNTER — Ambulatory Visit: Payer: Medicare Other | Admitting: Family Medicine

## 2017-08-07 ENCOUNTER — Encounter: Payer: Self-pay | Admitting: Internal Medicine

## 2017-08-07 ENCOUNTER — Ambulatory Visit (INDEPENDENT_AMBULATORY_CARE_PROVIDER_SITE_OTHER): Payer: Medicare Other | Admitting: Internal Medicine

## 2017-08-07 VITALS — BP 146/80 | HR 78 | Temp 98.0°F | Wt 149.0 lb

## 2017-08-07 DIAGNOSIS — M170 Bilateral primary osteoarthritis of knee: Secondary | ICD-10-CM | POA: Diagnosis not present

## 2017-08-07 DIAGNOSIS — G629 Polyneuropathy, unspecified: Secondary | ICD-10-CM

## 2017-08-07 MED ORDER — METHYLPREDNISOLONE ACETATE 40 MG/ML IJ SUSP
40.0000 mg | Freq: Once | INTRAMUSCULAR | Status: AC
  Administered 2017-08-07: 40 mg via INTRA_ARTICULAR

## 2017-08-07 MED ORDER — GABAPENTIN 300 MG PO CAPS: 300.0000 mg | ORAL_CAPSULE | Freq: Three times a day (TID) | ORAL | 11 refills | Status: DC

## 2017-08-07 NOTE — Assessment & Plan Note (Signed)
This is worse Will try increasing gabapentin at night---add extra 300mg 

## 2017-08-07 NOTE — Progress Notes (Signed)
Subjective:    Patient ID: Jose [name], male    DOB: May 17, 1924, 84 y.o.   MRN: [id number]  HPI Here with daughter Knee pain is getting worse Does use the ibuprofen ---usually 200mg  tid Has tried elastic braces---only helps a little No falls--hasn't really given out  Last year the shots helped briefly --- ?2-3 months Doesn't use tylenol--discussed  Current Outpatient Medications on File Prior to Visit  Medication Sig Dispense Refill  . aspirin 81 MG tablet Take 81 mg by mouth daily.      Marland Kitchen gabapentin (NEURONTIN) 600 MG tablet TAKE 1 TABLET (600 MG TOTAL) BY MOUTH 2 (TWO) TIMES DAILY. 180 tablet 3  . ibuprofen (ADVIL,MOTRIN) 200 MG tablet Take 200-400 mg by mouth 3 (three) times daily as needed. For knee pain     . ketoconazole (NIZORAL) 2 % cream Apply 1 application topically 2 (two) times daily as needed for irritation. 60 g 1  . tamsulosin (FLOMAX) 0.4 MG CAPS capsule TAKE 1 CAPSULE (0.4 MG TOTAL) BY MOUTH DAILY. 90 capsule 3  . triamcinolone cream (KENALOG) 0.1 % Apply 1 application topically 2 (two) times daily as needed. 30 g 1  . vitamin B-12 (CYANOCOBALAMIN) 500 MCG tablet Take 500 mcg by mouth daily.     No current facility-administered medications on file prior to visit.     No Known Allergies  Past Medical History:  Diagnosis Date  . Back problem 1960  . BPH (benign prostatic hypertrophy)   . Cervical radiculopathy    with neuropathy  . GERD (gastroesophageal reflux disease)   . Hypertension   . Melanoma in situ ([facility]) 12/2016   neck    Past Surgical History:  Procedure Laterality Date  . CATARACT EXTRACTION     OD  . MELANOMA EXCISION  12/2016   in situ---Dr Dasher  . TONSILLECTOMY      Family History  Problem Relation Age of Onset  . Cancer Son   . Coronary artery disease Neg Hx   . Diabetes Neg Hx     Social History   Socioeconomic History  . Marital status: Widowed    Spouse name: Not on file  . Number of children: 2  . Years of  education: Not on file  . Highest education level: Not on file  Occupational History  . Occupation: retiredOccupational psychologist [employer]    Employer: RETIRED  Social Needs  . Financial resource strain: Not on file  . Food insecurity:    Worry: Not on file    Inability: Not on file  . Transportation needs:    Medical: Not on file    Non-medical: Not on file  Tobacco Use  . Smoking status: Never Smoker  . Smokeless tobacco: Never Used  Substance and Sexual Activity  . Alcohol use: No  . Drug use: No  . Sexual activity: Not on file  Lifestyle  . Physical activity:    Days per week: Not on file    Minutes per session: Not on file  . Stress: Not on file  Relationships  . Social connections:    Talks on phone: Not on file    Gets together: Not on file    Attends religious service: Not on file    Active member of club or organization: Not on file    Attends meetings of clubs or organizations: Not on file    Relationship status: Not on file  . Intimate partner violence:    Fear of current or ex  partner: Not on file    Emotionally abused: Not on file    Physically abused: Not on file    Forced sexual activity: Not on file  Other Topics Concern  . Not on file  Social History Narrative   Has living will   Son Mikeal Hawthorne is health care POA.     Requests DNR--done 05/07/16   No feeding tube if cognitively unaware   Review of Systems  Not sleeping well---legs jump and the "nerves" hurt (burning) Appetite is okay     Objective:   Physical Exam  Constitutional: No distress.  Musculoskeletal:  Knees without sig effusion Crepitus and pain with ROM          Assessment & Plan:

## 2017-08-07 NOTE — Assessment & Plan Note (Signed)
Discussed alternatives and risks of procedure---he really wants the bilateral injections Verbal consent  PROCEDURE Sterile prep medial side of both knees Ethyl chloride then 2cc 1% plain lidocaine 40mg  depomedrol and ~4cc 1% lidocaine instilled in each knee. Significant resistance on left, some fluid lost on injection Tolerated procedures Discussed home care  If worsens, would consider ortho evaluation or Dr Lorelei Pont for synvisc or the like

## 2017-08-07 NOTE — Addendum Note (Signed)
Addended by: Tammi Sou on: 08/07/2017 03:55 PM   Modules accepted: Orders

## 2017-09-14 NOTE — Progress Notes (Signed)
Dr. Frederico Hamman T. Patriciann Becht, MD, [facility] [place], [address] Phone: [phone] Fax: [phone]  09/15/2017  Patient: Jose [name], MRN: [id number], DOB: 17-Mar-1924, 84 y.o.  Primary Physician:  [name], MD   Chief Complaint  Patient presents with  . Knee Pain    Bilateral   Subjective:   Jose [name] is a 84 y.o. very pleasant male patient who presents with the following:  Pleasant 84 year old gentleman seen at the request of Dr. Silvio Pate for evaluation of bilateral knees.  He has had multiple interventions in the past including recent bilateral corticosteroid injections with Depo-Medrol in both of his knees.  He is here today for other possible option and evaluation by myself.  His recent corticosteroid injections only lasted about 2 weeks.  He is having some increasing pain, but he has no interest in joint replacement at the age of 84.  His family is well-known to me.  He is accompanied by his daughter-in-law today.  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  B Synvisc injections  01/2014 Films from Dr. Clydell Hakim office Result Narrative  I ordered and interpreted standing AP, lateral, and sunrise radiographs of  the left knee that were obtained in the office today.There is narrowing of  the medial cartilage space with bone-on-bone articulation and associated  varus alignment.Osteophyte formation is noted. Subchondral sclerosis is  noted.No evidence of fracture or dislocation.   I ordered and interpreted standing AP, lateral, and sunrise radiographs of  the right knee that were obtained in the office today.There is narrowing  of the medial cartilage space with associated varus alignment.Osteophyte  formation is noted. Subchondral sclerosis is noted.No evidence of  fracture or dislocation.  Past Medical History, Surgical History, Social History, Family  History, Problem List, Medications, and Allergies have been reviewed and updated if relevant.  Patient Active Problem List   Diagnosis Date Noted  . Melanoma of neck ([place])   . Rash 09/05/2016  . Neuropathy 05/07/2016  . Anemia 05/11/2015  . Preventative health care 04/29/2014  . Advanced directives, counseling/discussion 04/29/2014  . Osteoarthrosis involving multiple sites 10/31/2010  . Osteoarthrosis of knee 10/31/2010  . ACTINIC KERATOSIS 12/04/2007  . BPH with obstruction/lower urinary tract symptoms 10/01/2006  . Essential hypertension, benign 09/17/2006  . GERD 09/17/2006  . Cervical radiculopathy 09/17/2006    Past Medical History:  Diagnosis Date  . Back problem 1960  . BPH (benign prostatic hypertrophy)   . Cervical radiculopathy    with neuropathy  . GERD (gastroesophageal reflux disease)   . Hypertension   . Melanoma in situ ([place]) 12/2016   neck    Past Surgical History:  Procedure Laterality Date  . CATARACT EXTRACTION     OD  . MELANOMA EXCISION  12/2016   in situ---Dr Dasher  . TONSILLECTOMY      Social History   Socioeconomic History  . Marital status: Widowed    Spouse name: Not on file  . Number of children: 2  . Years of education: Not on file  . Highest education level: Not on file  Occupational History  . Occupation: retiredOccupational psychologist [employer]    Employer: RETIRED  Social Needs  . Financial resource strain: Not on file  . Food insecurity:    Worry: Not on file    Inability: Not on file  . Transportation needs:    Medical: Not on file  Non-medical: Not on file  Tobacco Use  . Smoking status: Never Smoker  . Smokeless tobacco: Never Used  Substance and Sexual Activity  . Alcohol use: No  . Drug use: No  . Sexual activity: Not on file  Lifestyle  . Physical activity:    Days per week: Not on file    Minutes per session: Not on file  . Stress: Not on file  Relationships  . Social connections:    Talks on phone: Not on file     Gets together: Not on file    Attends religious service: Not on file    Active member of club or organization: Not on file    Attends meetings of clubs or organizations: Not on file    Relationship status: Not on file  . Intimate partner violence:    Fear of current or ex partner: Not on file    Emotionally abused: Not on file    Physically abused: Not on file    Forced sexual activity: Not on file  Other Topics Concern  . Not on file  Social History Narrative   Has living will   Son Mikeal Hawthorne is health care POA.     Requests DNR--done 05/07/16   No feeding tube if cognitively unaware    Family History  Problem Relation Age of Onset  . Cancer Son   . Coronary artery disease Neg Hx   . Diabetes Neg Hx     No Known Allergies  Medication list reviewed and updated in full in [facility].  GEN: No fevers, chills. Nontoxic. Primarily MSK c/o today. MSK: Detailed in the HPI GI: tolerating PO intake without difficulty Neuro: No numbness, parasthesias, or tingling associated. Otherwise the pertinent positives of the ROS are noted above.   Objective:   BP 130/70   Pulse 90   Temp 97.6 F (36.4 C) (Oral)   Ht 5' 6.5" (1.689 m)   Wt 144 lb 8 oz (65.5 kg)   BMI 22.97 kg/m    GEN: WDWN, NAD, Non-toxic, Alert & Oriented x 3 HEENT: Atraumatic, Normocephalic.  Ears and Nose: No external deformity. EXTR: No clubbing/cyanosis/edema NEURO: Normal gait.  PSYCH: Normally interactive. Conversant. Not depressed or anxious appearing.  Calm demeanor.    Both knees: Lack about 3 degrees of extension, flexion to 112 degrees.  There is have some modest medial and lateral joint line tenderness.  Stable to varus and valgus stress.  ACL and PCL are intact.  No significant tenderness with flexion pinch or with McMurray's.  Radiology: No results found.  Assessment and Plan:   Bilateral primary osteoarthritis of knee - Plan: Hylan SOSY 48 mg, Hylan SOSY 48 mg  We talked about arthritis  in general.  His most recent corticosteroid injections only lasted about 2 weeks.  I do think that it is reasonable to do a trial of some hyaluronic acid injection, because some patients do have a prolonged relief of symptoms.  Average clinical relief of symptoms would be 6 months, but this is highly individualized.  I encouraged him to continue to be active.  Tylenol would also be reasonable as well as ice, some occasional ibuprofen is probably not unreasonable.  If he does get a good relief of symptoms with his hyaluronic acid injections, we could repeat these up to every 6 months.  Knee Injection: Synvisc-One, R Patient verbally consented to procedure. Risks (including infection), benefits, and alternatives explained. Sterilely prepped with Chloraprep. Ethyl cholride used for anesthesia, then  7 cc of Lidocaine 1% used for anesthesia in the anterolateral position. Reprepped with Chloraprep.  Anterolateral approach used to inject joint without difficulty, injected with Synvisc-One, 6 mL. No complications with procedure and tolerated well.   Knee Injection: Synvisc-One, L Patient verbally consented to procedure. Risks (including infection), benefits, and alternatives explained. Sterilely prepped with Chloraprep. Ethyl cholride used for anesthesia, then 7 cc of Lidocaine 1% used for anesthesia in the anterolateral position. Reprepped with Chloraprep.  Anterolateral approach used to inject joint without difficulty, injected with Synvisc-One, 6 mL. No complications with procedure and tolerated well.   Follow-up: prn  Meds ordered this encounter  Medications  . Hylan SOSY 48 mg  . Hylan SOSY 48 mg   Signed,  Eisley Barber T. Naheem Mosco, MD   Allergies as of 09/15/2017   No Known Allergies     Medication List        Accurate as of 09/15/17  1:46 PM. Always use your most recent med list.          aspirin 81 MG tablet Take 81 mg by mouth daily.   gabapentin 600 MG tablet Commonly known as:   NEURONTIN TAKE 1 TABLET (600 MG TOTAL) BY MOUTH 2 (TWO) TIMES DAILY.   gabapentin 300 MG capsule Commonly known as:  NEURONTIN Take 1 capsule (300 mg total) by mouth 3 (three) times daily. In addition to the 600mg    ibuprofen 200 MG tablet Commonly known as:  ADVIL,MOTRIN Take 200-400 mg by mouth 3 (three) times daily as needed. For knee pain   ketoconazole 2 % cream Commonly known as:  NIZORAL Apply 1 application topically 2 (two) times daily as needed for irritation.   tamsulosin 0.4 MG Caps capsule Commonly known as:  FLOMAX TAKE 1 CAPSULE (0.4 MG TOTAL) BY MOUTH DAILY.   triamcinolone cream 0.1 % Commonly known as:  KENALOG Apply 1 application topically 2 (two) times daily as needed.   vitamin B-12 500 MCG tablet Commonly known as:  CYANOCOBALAMIN Take 500 mcg by mouth daily.

## 2017-09-15 ENCOUNTER — Encounter: Payer: Self-pay | Admitting: Family Medicine

## 2017-09-15 ENCOUNTER — Other Ambulatory Visit: Payer: Self-pay

## 2017-09-15 ENCOUNTER — Ambulatory Visit (INDEPENDENT_AMBULATORY_CARE_PROVIDER_SITE_OTHER): Payer: Medicare Other | Admitting: Family Medicine

## 2017-09-15 VITALS — BP 130/70 | HR 90 | Temp 97.6°F | Ht 66.5 in | Wt 144.5 lb

## 2017-09-15 DIAGNOSIS — M170 Bilateral primary osteoarthritis of knee: Secondary | ICD-10-CM | POA: Diagnosis not present

## 2017-09-15 MED ORDER — HYLAN G-F 20 48 MG/6ML IX SOSY
48.0000 mg | PREFILLED_SYRINGE | Freq: Once | INTRA_ARTICULAR | Status: AC
  Administered 2017-09-15: 48 mg via INTRA_ARTICULAR

## 2017-12-27 ENCOUNTER — Other Ambulatory Visit: Payer: Self-pay

## 2017-12-27 ENCOUNTER — Encounter: Payer: Self-pay | Admitting: Emergency Medicine

## 2017-12-27 ENCOUNTER — Emergency Department: Payer: Medicare Other

## 2017-12-27 ENCOUNTER — Inpatient Hospital Stay
Admission: EM | Admit: 2017-12-27 | Discharge: 2017-12-30 | DRG: 446 | Disposition: A | Discharge status: Home / Self Care | Payer: Medicare Other | Attending: Surgery | Admitting: Surgery

## 2017-12-27 DIAGNOSIS — I700 Atherosclerosis of aorta: Secondary | ICD-10-CM | POA: Diagnosis present

## 2017-12-27 DIAGNOSIS — N400 Enlarged prostate without lower urinary tract symptoms: Secondary | ICD-10-CM | POA: Diagnosis present

## 2017-12-27 DIAGNOSIS — K8063 Calculus of gallbladder and bile duct with acute cholecystitis with obstruction: Secondary | ICD-10-CM | POA: Diagnosis not present

## 2017-12-27 DIAGNOSIS — Z85820 Personal history of malignant melanoma of skin: Secondary | ICD-10-CM

## 2017-12-27 DIAGNOSIS — M5412 Radiculopathy, cervical region: Secondary | ICD-10-CM | POA: Diagnosis present

## 2017-12-27 DIAGNOSIS — K219 Gastro-esophageal reflux disease without esophagitis: Secondary | ICD-10-CM | POA: Diagnosis present

## 2017-12-27 DIAGNOSIS — Z7982 Long term (current) use of aspirin: Secondary | ICD-10-CM

## 2017-12-27 DIAGNOSIS — K819 Cholecystitis, unspecified: Secondary | ICD-10-CM

## 2017-12-27 DIAGNOSIS — R079 Chest pain, unspecified: Secondary | ICD-10-CM

## 2017-12-27 DIAGNOSIS — R410 Disorientation, unspecified: Secondary | ICD-10-CM | POA: Diagnosis not present

## 2017-12-27 DIAGNOSIS — Z7952 Long term (current) use of systemic steroids: Secondary | ICD-10-CM

## 2017-12-27 DIAGNOSIS — R451 Restlessness and agitation: Secondary | ICD-10-CM | POA: Diagnosis not present

## 2017-12-27 DIAGNOSIS — Z791 Long term (current) use of non-steroidal anti-inflammatories (NSAID): Secondary | ICD-10-CM

## 2017-12-27 DIAGNOSIS — K8000 Calculus of gallbladder with acute cholecystitis without obstruction: Secondary | ICD-10-CM | POA: Diagnosis present

## 2017-12-27 DIAGNOSIS — I10 Essential (primary) hypertension: Secondary | ICD-10-CM | POA: Diagnosis present

## 2017-12-27 DIAGNOSIS — R1010 Upper abdominal pain, unspecified: Secondary | ICD-10-CM

## 2017-12-27 DIAGNOSIS — K811 Chronic cholecystitis: Secondary | ICD-10-CM | POA: Diagnosis present

## 2017-12-27 DIAGNOSIS — Z79899 Other long term (current) drug therapy: Secondary | ICD-10-CM

## 2017-12-27 LAB — BASIC METABOLIC PANEL
Anion gap: 9 (ref 5–15)
BUN: 19 mg/dL (ref 8–23)
CHLORIDE: 108 mmol/L (ref 98–111)
CO2: 26 mmol/L (ref 22–32)
Calcium: 9.3 mg/dL (ref 8.9–10.3)
Creatinine, Ser: 0.75 mg/dL (ref 0.61–1.24)
GFR calc Af Amer: >60 mL/min (ref >60)
GFR calc non Af Amer: >60 mL/min (ref >60)
Glucose, Bld: 146 mg/dL — ABNORMAL HIGH (ref 70–99)
Potassium: 3.7 mmol/L (ref 3.5–5.1)
SODIUM: 143 mmol/L (ref 135–145)

## 2017-12-27 LAB — HEPATIC FUNCTION PANEL
ALBUMIN: 4.3 g/dL (ref 3.5–5.0)
ALK PHOS: 76 U/L (ref 38–126)
ALT: 18 U/L (ref 0–44)
AST: 19 U/L (ref 15–41)
BILIRUBIN TOTAL: 0.9 mg/dL (ref 0.3–1.2)
Bilirubin, Direct: 0.2 mg/dL (ref 0.0–0.2)
Indirect Bilirubin: 0.7 mg/dL (ref 0.3–0.9)
TOTAL PROTEIN: 7 g/dL (ref 6.5–8.1)

## 2017-12-27 LAB — LIPASE, BLOOD: Lipase: 26 U/L (ref 11–51)

## 2017-12-27 LAB — TROPONIN I: Troponin I: <0.03 ng/mL (ref <0.03)

## 2017-12-27 LAB — CBC
HCT: 34.7 % — ABNORMAL LOW (ref 40.0–52.0)
HEMOGLOBIN: 12.4 g/dL — AB (ref 13.0–18.0)
MCH: 35.3 pg — ABNORMAL HIGH (ref 26.0–34.0)
MCHC: 35.8 g/dL (ref 32.0–36.0)
MCV: 98.6 fL (ref 80.0–100.0)
Platelets: 157 10*3/uL (ref 150–440)
RBC: 3.52 MIL/uL — AB (ref 4.40–5.90)
RDW: 17.8 % — ABNORMAL HIGH (ref 11.5–14.5)
WBC: 7.5 10*3/uL (ref 3.8–10.6)

## 2017-12-27 LAB — LACTATE DEHYDROGENASE: LDH: 145 U/L (ref 98–192)

## 2017-12-27 MED ORDER — KETOROLAC TROMETHAMINE 30 MG/ML IJ SOLN
30.0000 mg | Freq: Once | INTRAMUSCULAR | Status: AC
  Administered 2017-12-27: 30 mg via INTRAVENOUS
  Filled 2017-12-27: qty 1

## 2017-12-27 MED ORDER — ENOXAPARIN SODIUM 40 MG/0.4ML ~~LOC~~ SOLN
40.0000 mg | SUBCUTANEOUS | Status: DC
  Administered 2017-12-27 – 2017-12-29 (×2): 40 mg via SUBCUTANEOUS
  Filled 2017-12-27 (×2): qty 0.4

## 2017-12-27 MED ORDER — TRAMADOL HCL 50 MG PO TABS
50.0000 mg | ORAL_TABLET | Freq: Four times a day (QID) | ORAL | Status: DC | PRN
  Administered 2017-12-29 (×2): 50 mg via ORAL
  Filled 2017-12-27 (×2): qty 1

## 2017-12-27 MED ORDER — POLYETHYLENE GLYCOL 3350 17 G PO PACK
17.0000 g | PACK | Freq: Every day | ORAL | Status: DC
  Administered 2017-12-27 – 2017-12-30 (×3): 17 g via ORAL
  Filled 2017-12-27 (×3): qty 1

## 2017-12-27 MED ORDER — HYDROMORPHONE HCL 1 MG/ML IJ SOLN
0.5000 mg | Freq: Once | INTRAMUSCULAR | Status: AC
  Administered 2017-12-27: 0.5 mg via INTRAVENOUS
  Filled 2017-12-27: qty 1

## 2017-12-27 MED ORDER — PIPERACILLIN-TAZOBACTAM 3.375 G IVPB
3.3750 g | Freq: Three times a day (TID) | INTRAVENOUS | Status: DC
  Administered 2017-12-27 – 2017-12-29 (×5): 3.375 g via INTRAVENOUS
  Filled 2017-12-27 (×5): qty 50

## 2017-12-27 MED ORDER — ONDANSETRON HCL 4 MG/2ML IJ SOLN
4.0000 mg | Freq: Once | INTRAMUSCULAR | Status: AC
  Administered 2017-12-27: 4 mg via INTRAVENOUS
  Filled 2017-12-27: qty 2

## 2017-12-27 MED ORDER — IOHEXOL 300 MG/ML  SOLN
100.0000 mL | Freq: Once | INTRAMUSCULAR | Status: AC | PRN
  Administered 2017-12-27: 100 mL via INTRAVENOUS

## 2017-12-27 MED ORDER — LORAZEPAM 2 MG/ML IJ SOLN
0.5000 mg | Freq: Once | INTRAMUSCULAR | Status: AC
  Administered 2017-12-27: 0.5 mg via INTRAVENOUS
  Filled 2017-12-27: qty 1

## 2017-12-27 MED ORDER — FENTANYL CITRATE (PF) 100 MCG/2ML IJ SOLN
25.0000 ug | Freq: Once | INTRAMUSCULAR | Status: AC
  Administered 2017-12-27: 25 ug via INTRAVENOUS
  Filled 2017-12-27: qty 2

## 2017-12-27 MED ORDER — IBUPROFEN 400 MG PO TABS
600.0000 mg | ORAL_TABLET | Freq: Three times a day (TID) | ORAL | Status: DC | PRN
  Administered 2017-12-30: 600 mg via ORAL
  Filled 2017-12-27 (×2): qty 2

## 2017-12-27 MED ORDER — ONDANSETRON 4 MG PO TBDP
4.0000 mg | ORAL_TABLET | Freq: Four times a day (QID) | ORAL | Status: DC | PRN
Start: 2017-12-27 — End: 2017-12-30

## 2017-12-27 MED ORDER — FENTANYL CITRATE (PF) 100 MCG/2ML IJ SOLN
INTRAMUSCULAR | Status: AC
  Filled 2017-12-27: qty 2

## 2017-12-27 MED ORDER — ONDANSETRON HCL 4 MG/2ML IJ SOLN: 4.0000 mg | Freq: Four times a day (QID) | INTRAMUSCULAR | Status: DC | PRN

## 2017-12-27 MED ORDER — ACETAMINOPHEN 325 MG PO TABS
650.0000 mg | ORAL_TABLET | Freq: Four times a day (QID) | ORAL | Status: DC | PRN
  Administered 2017-12-27 – 2017-12-28 (×2): 650 mg via ORAL
  Filled 2017-12-27 (×2): qty 2

## 2017-12-27 MED ORDER — MORPHINE SULFATE (PF) 2 MG/ML IV SOLN
2.0000 mg | INTRAVENOUS | Status: DC | PRN
  Administered 2017-12-27 – 2017-12-29 (×5): 2 mg via INTRAVENOUS
  Filled 2017-12-27 (×5): qty 1

## 2017-12-27 MED ORDER — SODIUM CHLORIDE 0.9 % IV SOLN
2.0000 g | INTRAVENOUS | Status: DC
  Administered 2017-12-27: 2 g via INTRAVENOUS
  Filled 2017-12-27: qty 2

## 2017-12-27 MED ORDER — HYDROCODONE-ACETAMINOPHEN 5-325 MG PO TABS: 1.0000 | ORAL_TABLET | ORAL | Status: DC | PRN

## 2017-12-27 MED ORDER — BISACODYL 5 MG PO TBEC: 5.0000 mg | DELAYED_RELEASE_TABLET | Freq: Every day | ORAL | Status: DC | PRN

## 2017-12-27 MED ORDER — DOCUSATE SODIUM 100 MG PO CAPS: 100.0000 mg | ORAL_CAPSULE | Freq: Two times a day (BID) | ORAL | Status: DC | PRN

## 2017-12-27 MED ORDER — SIMETHICONE 80 MG PO CHEW
40.0000 mg | CHEWABLE_TABLET | Freq: Four times a day (QID) | ORAL | Status: DC | PRN
  Filled 2017-12-27: qty 1

## 2017-12-27 MED ORDER — LACTATED RINGERS IV SOLN
INTRAVENOUS | Status: DC
  Administered 2017-12-27 – 2017-12-28 (×2): via INTRAVENOUS

## 2017-12-27 MED ORDER — FENTANYL CITRATE (PF) 100 MCG/2ML IJ SOLN
25.0000 ug | Freq: Once | INTRAMUSCULAR | Status: AC
  Administered 2017-12-27: 25 ug via INTRAVENOUS

## 2017-12-27 NOTE — ED Triage Notes (Signed)
Pt c/o 5-6 hours of chest pain, radiating to bilateral arms. Pt denies diaphoresis and dyspnea.

## 2017-12-27 NOTE — ED Provider Notes (Signed)
[facility] Emergency Department Provider Note   ____________________________________________   First MD Initiated Contact with Patient 12/27/17 [phone]     (approximate)  I have reviewed the triage vital signs and the nursing notes.   HISTORY  Chief Complaint Chest Pain    HPI Jose [name] is a 84 y.o. male who presents to the ED from home with a chief complaint of chest/upper abdominal pain.  Patient reports onset of pain after eating seafood approximately 6 PM.  Symptoms not associated with diaphoresis, shortness of breath, nausea or vomiting.  Denies fever, chills, dysuria, diarrhea.  Last bowel movement was yesterday.  Denies recent travel or trauma.   Past Medical History:  Diagnosis Date  . Back problem 1960  . BPH (benign prostatic hypertrophy)   . Cervical radiculopathy    with neuropathy  . GERD (gastroesophageal reflux disease)   . Hypertension   . Melanoma in situ [facility]) 12/2016   neck    Patient Active Problem List   Diagnosis Date Noted  . Melanoma of neck ([place])   . Rash 09/05/2016  . Neuropathy 05/07/2016  . Anemia 05/11/2015  . Preventative health care 04/29/2014  . Advanced directives, counseling/discussion 04/29/2014  . Osteoarthrosis involving multiple sites 10/31/2010  . Osteoarthrosis of knee 10/31/2010  . ACTINIC KERATOSIS 12/04/2007  . BPH with obstruction/lower urinary tract symptoms 10/01/2006  . Essential hypertension, benign 09/17/2006  . GERD 09/17/2006  . Cervical radiculopathy 09/17/2006    Past Surgical History:  Procedure Laterality Date  . CATARACT EXTRACTION     OD  . MELANOMA EXCISION  12/2016   in situ---Dr Dasher  . TONSILLECTOMY      Prior to Admission medications   Medication Sig Start Date End Date Taking? Authorizing Provider  aspirin 81 MG tablet Take 81 mg by mouth daily.      [provider]  gabapentin (NEURONTIN) 300 MG capsule Take 1 capsule (300 mg total) by mouth 3  (three) times daily. In addition to the 600mg  08/07/17   [name], MD  gabapentin (NEURONTIN) 600 MG tablet TAKE 1 TABLET (600 MG TOTAL) BY MOUTH 2 (TWO) TIMES DAILY. 07/21/17   [name], MD  ibuprofen (ADVIL,MOTRIN) 200 MG tablet Take 200-400 mg by mouth 3 (three) times daily as needed. For knee pain     [provider]  ketoconazole (NIZORAL) 2 % cream Apply 1 application topically 2 (two) times daily as needed for irritation. 05/07/17   [name], MD  tamsulosin (FLOMAX) 0.4 MG CAPS capsule TAKE 1 CAPSULE (0.4 MG TOTAL) BY MOUTH DAILY. 07/21/17   [name], MD  triamcinolone cream (KENALOG) 0.1 % Apply 1 application topically 2 (two) times daily as needed. 09/05/16   [name], MD  vitamin B-12 (CYANOCOBALAMIN) 500 MCG tablet Take 500 mcg by mouth daily.    [provider]    Allergies Patient has no known allergies.  Family History  Problem Relation Age of Onset  . Cancer Son   . Coronary artery disease Neg Hx   . Diabetes Neg Hx     Social History Social History   Tobacco Use  . Smoking status: Never Smoker  . Smokeless tobacco: Never Used  Substance Use Topics  . Alcohol use: No  . Drug use: No    Review of Systems  Constitutional: No fever/chills Eyes: No visual changes. ENT: No sore throat. Cardiovascular: Positive for chest pain. Respiratory: Denies shortness of breath. Gastrointestinal: Positive  for abdominal pain.  No nausea, no vomiting.  No diarrhea.  No constipation. Genitourinary: Negative for dysuria. Musculoskeletal: Negative for back pain. Skin: Negative for rash. Neurological: Negative for headaches, focal weakness or numbness.   ____________________________________________   PHYSICAL EXAM:  VITAL SIGNS: ED Triage Vitals  Enc Vitals Group     BP 12/27/17 0436 118/82     Pulse --      Resp 12/27/17 0427 (!) 24     Temp 12/27/17 0427 97.9 F (36.6 C)     Temp Source 12/27/17 0427 Oral      SpO2 12/27/17 0427 94 %     Weight 12/27/17 0429 145 lb (65.8 kg)     Height 12/27/17 0429 5\' 7"  (1.702 m)     Head Circumference --      Peak Flow --      Pain Score --      Pain Loc --      Pain Edu? --      Excl. in [place]? --     Constitutional: Alert and oriented. Well appearing and in mild acute distress. Eyes: Conjunctivae are normal. PERRL. EOMI. Head: Atraumatic. Nose: No congestion/rhinnorhea. Mouth/Throat: Mucous membranes are moist.  Oropharynx non-erythematous. Neck: No stridor.   Cardiovascular: Normal rate, regular rhythm. Grossly normal heart sounds.  Good peripheral circulation. Respiratory: Normal respiratory effort.  No retractions. Lungs CTAB. Gastrointestinal: Soft and mildly tender to palpation epigastrium and right upper quadrant without rebound or guarding.  Mild distention. No abdominal bruits. No CVA tenderness. Musculoskeletal: No lower extremity tenderness nor edema.  No joint effusions. Neurologic:  Normal speech and language. No gross focal neurologic deficits are appreciated.  Skin:  Skin is warm, dry and intact. No rash noted. Psychiatric: Mood and affect are normal. Speech and behavior are normal.  ____________________________________________   LABS (all labs ordered are listed, but only abnormal results are displayed)  Labs Reviewed  BASIC METABOLIC PANEL - Abnormal; Notable for the following components:      Result Value   Glucose, Bld 146 (*)    All other components within normal limits  CBC - Abnormal; Notable for the following components:   RBC 3.52 (*)    Hemoglobin 12.4 (*)    HCT 34.7 (*)    MCH 35.3 (*)    RDW 17.8 (*)    All other components within normal limits  TROPONIN I  HEPATIC FUNCTION PANEL  LIPASE, BLOOD   ____________________________________________  EKG  ED ECG REPORT I, SUNG,JADE J, the attending physician, personally viewed and interpreted this ECG.   Date: 12/27/2017  EKG Time: 0426  Rate: 91  Rhythm: normal  EKG, normal sinus rhythm  Axis: LAD  Intervals:none  ST&T Change: Nonspecific  ____________________________________________  RADIOLOGY  ED MD interpretation: Atelectasis; stones lodged in gallbladder neck  Official radiology report(s): Dg Chest 1 View  Result Date: 12/27/2017 CLINICAL DATA:  Acute onset of generalized chest pain, radiating to the arms. EXAM: CHEST  1 VIEW COMPARISON:  None. FINDINGS: The lungs are well-aerated. Mild bibasilar opacities likely reflect atelectasis. There is no evidence of pleural effusion or pneumothorax. The cardiomediastinal silhouette is within normal limits. No acute osseous abnormalities are seen. IMPRESSION: Mild bibasilar opacities likely reflect atelectasis; lungs otherwise clear. Electronically Signed   By: Garald Balding M.D.   On: 12/27/2017 04:47   US Abdomen Limited Ruq  Result Date: 12/27/2017 CLINICAL DATA:  Acute onset of right upper quadrant abdominal pain and epigastric pain. EXAM: ULTRASOUND ABDOMEN LIMITED RIGHT  UPPER QUADRANT COMPARISON:  None. FINDINGS: Gallbladder: Mild gallbladder wall thickening is noted. Stones are seen lodged at the gallbladder neck, measuring up to 1.7 cm in size. No pericholecystic fluid is seen. Underlying echogenic sludge is noted within the gallbladder. A positive ultrasonographic Murphy's sign is noted. Common bile duct: Diameter: 0.8 cm, within normal limits in caliber for the patient's age. Liver: No focal lesion identified. Within normal limits in parenchymal echogenicity. Portal vein is patent on color Doppler imaging with normal direction of blood flow towards the liver. IMPRESSION: 1. Stones lodged at the gallbladder neck, with a positive ultrasonographic Murphy's sign and mild gallbladder wall thickening. This is thought to reflect obstruction secondary to stones at the gallbladder neck rather than cholecystitis. 2. Mild echogenic sludge noted within the gallbladder. Electronically Signed   By: Garald Balding  M.D.   On: 12/27/2017 05:58    ____________________________________________   PROCEDURES  Procedure(s) performed: None  Procedures  Critical Care performed: Yes, see critical care note(s)   CRITICAL CARE Performed by: Paulette Blanch   Total critical care time: 30 minutes  Critical care time was exclusive of separately billable procedures and treating other patients.  Critical care was necessary to treat or prevent imminent or life-threatening deterioration.  Critical care was time spent personally by me on the following activities: development of treatment plan with patient and/or surrogate as well as nursing, discussions with consultants, evaluation of patient's response to treatment, examination of patient, obtaining history from patient or surrogate, ordering and performing treatments and interventions, ordering and review of laboratory studies, ordering and review of radiographic studies, pulse oximetry and re-evaluation of patient's condition.  ____________________________________________   INITIAL IMPRESSION / ASSESSMENT AND PLAN / ED COURSE  As part of my medical decision making, I reviewed the following data within the [place] History obtained from family, Nursing notes reviewed and incorporated, Labs reviewed, EKG interpreted, Old chart reviewed, Radiograph reviewed and Notes from prior ED visits   84 year old male with hypertension and GERD who presents with upper abdominal/substernal chest pain since 6 PM. Differential diagnosis includes, but is not limited to, biliary disease (biliary colic, acute cholecystitis, cholangitis, choledocholithiasis, etc), intrathoracic causes for epigastric abdominal pain including ACS, gastritis, duodenitis, pancreatitis, small bowel or large bowel obstruction, abdominal aortic aneurysm, hernia, and ulcer(s).  Will obtain screening lab work including LFTs and lipase.  Administer 25 mcg IV fentanyl for pain paired with 4  mg IV Zofran for nausea.  Proceed with right upper quadrant abdominal ultrasound to evaluate for cholecystitis.  Clinical Course as of Dec 28 651  Sat Dec 27, 2017  0606 Patient having significant pain after returning from ultrasound.  Will re-dose IV fentanyl.  Discussed with surgery on-call Dr. Lysle Pearl who will evaluate patient in the emergency department.  He is currently in the OR.  I have updated the patient and his family.   [JS]    Clinical Course User Index [JS] Paulette Blanch, MD     ____________________________________________   FINAL CLINICAL IMPRESSION(S) / ED DIAGNOSES  Final diagnoses:  Chest pain, unspecified type  Pain of upper abdomen  Calculus of gallbladder and bile duct with acute cholecystitis, with obstruction     ED Discharge Orders    None       Note:  This document was prepared using Dragon voice recognition software and may include unintentional dictation errors.    Paulette Blanch, MD 12/27/17 [phone]

## 2017-12-27 NOTE — Care Management Note (Signed)
Case Management Note  Patient Details  Name: Jose [name] MRN: [id number] Date of Birth: 1923/09/30  Subjective/Objective:  Patient admitted to [facility] under observation status for cholecystitis. RNCM consulted on patient to provide MOON letter and complete assessment. Patient currently lives alone but his son and daughter [name] [phone] are able to assist often. Patient ambulates using a cane and no other DME. Still drives occasionally but family is also able to assist with transport. PCP is [place]. Uses [facility] and obtains medications without issue.                     Action/Plan: RNCM to continue to follow for any needs.   Expected Discharge Date:                  Expected Discharge Plan:     In-House Referral:     Discharge planning Services     Post Acute Care Choice:    Choice offered to:     DME Arranged:    DME Agency:     HH Arranged:    HH Agency:     Status of Service:     If discussed at [employer], dates discussed:    Additional Comments:  Latanya Maudlin, RN 12/27/2017, 3:04 PM

## 2017-12-27 NOTE — Progress Notes (Signed)
Update:  Notified by RN of AMS and now documented fever of 102.  She states his abdominal pain has not changed significantly.  Review of chart once again does not indicate why he is potentially becoming more septic.  PE and CT, normal WBC all indicate minor cholecystitis at best.  Nevertheless, we will continue to monitor closely and broaden coverage to zosyn, check lactate.  RN notified of plan and acknowledged.

## 2017-12-27 NOTE — ED Notes (Signed)
Family updated on treatment plan. Pt does not verbalize understanding of treatment plan.

## 2017-12-27 NOTE — H&P (Addendum)
Subjective:   CC: abdominal pain  HPI:  Jose [name] is a 84 y.o. male who is consulted by [place] sung for evaluation of above cc.  Symptoms were first noted 1 day ago. Pain is located mainly in the periumbilical region extending to the right upper quadrant.  Associated with bloating and couple day history of loose stools nonbloody.  Denies any melena, exacerbated by palpation.  Patient also endorses mild nausea but no episodes of emesis.  Tolerating a diet until last night     Past Medical History:  has a past medical history of Back problem (1960), BPH (benign prostatic hypertrophy), Cervical radiculopathy, GERD (gastroesophageal reflux disease), Hypertension, and Melanoma in situ ([place]) (12/2016).  Past Surgical History:  has a past surgical history that includes Tonsillectomy; Cataract extraction; and Melanoma excision (12/2016).  Family History: family history includes Cancer in his son.  Social History:  reports that he has never smoked. He has never used smokeless tobacco. He reports that he does not drink alcohol or use drugs.  Current Medications:  Medications Prior to Admission  Medication Sig Dispense Refill  . aspirin 81 MG tablet Take 81 mg by mouth daily.      . ferrous sulfate 325 (65 FE) MG EC tablet Take 325 mg by mouth daily with breakfast.    . gabapentin (NEURONTIN) 600 MG tablet TAKE 1 TABLET (600 MG TOTAL) BY MOUTH 2 (TWO) TIMES DAILY. (Patient taking differently: Take 600 mg by mouth 2 (two) times daily. ) 180 tablet 3  . ibuprofen (ADVIL,MOTRIN) 200 MG tablet Take 200-400 mg by mouth 3 (three) times daily as needed (Knee pain).     Marland Kitchen ketoconazole (NIZORAL) 2 % cream Apply 1 application topically 2 (two) times daily as needed for irritation. 60 g 1  . tamsulosin (FLOMAX) 0.4 MG CAPS capsule TAKE 1 CAPSULE (0.4 MG TOTAL) BY MOUTH DAILY. (Patient taking differently: Take 0.4 mg by mouth daily. ) 90 capsule 3  . triamcinolone cream (KENALOG) 0.1 % Apply 1 application  topically 2 (two) times daily as needed. 30 g 1  . vitamin B-12 (CYANOCOBALAMIN) 500 MCG tablet Take 500 mcg by mouth daily.    Marland Kitchen gabapentin (NEURONTIN) 300 MG capsule Take 1 capsule (300 mg total) by mouth 3 (three) times daily. In addition to the 600mg  (Patient not taking: Reported on 12/27/2017) 30 capsule 11    Allergies:  Allergies as of 12/27/2017  . (No Known Allergies)    ROS:  A 15 point review of systems was performed and pertinent positives and negatives noted in HPI    Objective:     BP (!) 168/78   Pulse 100   Temp 99.1 F (37.3 C) (Oral)   Resp 20   Ht 5\' 7"  (1.702 m)   Wt 65.8 kg   SpO2 93%   BMI 22.71 kg/m    Constitutional :  alert and cooperative  Lymphatics/Throat:  no asymmetry, masses, or scars  Respiratory:  clear to auscultation bilaterally  Cardiovascular:  regular rate and rhythm  Gastrointestinal: Soft with tenderness mainly located in the periumbilical region possible slight tenderness to palpation right upper quadrant.  Abdomen is hypertympanic and daughter endorses it looking more bloated than usual..   Musculoskeletal: Steady gait and movement  Skin: Cool and moist  Psychiatric: Normal affect, non-agitated, not confused       LABS:  CMP Latest Ref Rng & Units 12/27/2017 05/07/2017 05/07/2016  Glucose 70 - 99 mg/dL 146(H) 79 87  BUN 8 -  23 mg/dL 19 17 22   Creatinine 0.61 - 1.24 mg/dL 0.75 0.93 0.95  Sodium 135 - 145 mmol/L 143 140 140  Potassium 3.5 - 5.1 mmol/L 3.7 4.3 4.6  Chloride 98 - 111 mmol/L 108 106 107  CO2 22 - 32 mmol/L 26 27 27   Calcium 8.9 - 10.3 mg/dL 9.3 9.3 9.5  Total Protein 6.5 - 8.1 g/dL 7.0 7.1 6.8  Total Bilirubin 0.3 - 1.2 mg/dL 0.9 0.7 0.6  Alkaline Phos 38 - 126 U/L 76 73 72  AST 15 - 41 U/L 19 12 11   ALT 0 - 44 U/L 18 14 13    CBC Latest Ref Rng & Units 12/27/2017 05/07/2017 05/07/2016  WBC 3.8 - 10.6 K/uL 7.5 6.6 5.7  Hemoglobin 13.0 - 18.0 g/dL 12.4(L) 13.9 13.2  Hematocrit 40.0 - 52.0 % 34.7(L) 40.8 38.2(L)   Platelets 150 - 440 K/uL 157 164.0 158.0     RADS: CLINICAL DATA:  Acute onset of right upper quadrant abdominal pain and epigastric pain.  EXAM: ULTRASOUND ABDOMEN LIMITED RIGHT UPPER QUADRANT  COMPARISON:  None.  FINDINGS: Gallbladder:  Mild gallbladder wall thickening is noted. Stones are seen lodged at the gallbladder neck, measuring up to 1.7 cm in size. No pericholecystic fluid is seen. Underlying echogenic sludge is noted within the gallbladder. A positive ultrasonographic Murphy's sign is noted.  Common bile duct:  Diameter: 0.8 cm, within normal limits in caliber for the patient's age.  Liver:  No focal lesion identified. Within normal limits in parenchymal echogenicity. Portal vein is patent on color Doppler imaging with normal direction of blood flow towards the liver.  IMPRESSION: 1. Stones lodged at the gallbladder neck, with a positive ultrasonographic Murphy's sign and mild gallbladder wall thickening. This is thought to reflect obstruction secondary to stones at the gallbladder neck rather than cholecystitis. 2. Mild echogenic sludge noted within the gallbladder.   Electronically Signed   By: Garald Balding M.D.   On: 12/27/2017 05:58  CLINICAL DATA:  Upper abdominal pain.  No fevers or chills.  EXAM: CT ABDOMEN AND PELVIS WITH CONTRAST  TECHNIQUE: Multidetector CT imaging of the abdomen and pelvis was performed using the standard protocol following bolus administration of intravenous contrast.  CONTRAST:  185mL OMNIPAQUE IOHEXOL 300 MG/ML  SOLN  COMPARISON:  None.  FINDINGS: Lower chest: Mild bibasilar atelectasis. Distal esophageal wall thickening as can be seen with esophagitis.  Hepatobiliary: No focal hepatic mass. Low attenuation of the liver as can be seen with hepatic steatosis. Distended gallbladder with mild gallbladder wall thickening and cholelithiasis within the gallbladder neck concerning for  cholecystitis. No intrahepatic or extrahepatic biliary ductal dilatation.  Pancreas: Unremarkable. No pancreatic ductal dilatation or surrounding inflammatory changes. Mild pancreatic ductal dilatation left focal obstructing lesion.  Spleen: Normal in size without focal abnormality.  Adrenals/Urinary Tract: Normal adrenal glands. Normal right kidney. 16 mm hypodense, fluid attenuating left renal mass most consistent with a cyst. Smaller 9 mm cyst in the left kidney. No urolithiasis or obstructive uropathy. Normal bladder.  Stomach/Bowel: Stomach is within normal limits. Appendix appears normal. No evidence of bowel wall thickening, distention, or inflammatory changes. Diverticulosis without evidence of diverticulitis.  Vascular/Lymphatic: Normal caliber abdominal aorta with atherosclerosis. No lymphadenopathy.  Reproductive: Prostate is unremarkable.  Other: No abdominal wall hernia or abnormality. No abdominopelvic ascites.  Musculoskeletal: No acute osseous abnormality. No aggressive osseous lesion. Degenerative disc disease with disc height loss throughout the thoracolumbar spine with facet arthropathy. Bilateral foraminal stenosis at L4-5.  IMPRESSION: 1.  Distended gallbladder with mild gallbladder wall thickening and cholelithiasis within the gallbladder neck concerning for cholecystitis. 2. Hepatic steatosis. 3.  Aortic Atherosclerosis (ICD10-I70.0).   Electronically Signed   By: Kathreen Devoid   On: 12/27/2017 09:44 Assessment:     abdominal pain.  Plan:     Initial history physical exam concerning for possible bowel pathology so requested a CT scan to follow-up on the ultrasound to ensure no other pathology noted..  Subsequent CT scan revealed some constipation but also noted a distended gallbladder with wall thickening which was also noted on the initial ultrasound study.  With constipation as the only other obvious cause of the distention and  abdominal pain, discussed with family possibility of lap chole to resolve current symptoms.  Discussed the risk of surgery including post-op infxn, seroma, biloma, chronic pain, poor-delayed wound healing, retained gallstone, conversion to open procedure, post-op SBO or ileus, and need for additional procedures to address said risks.  The risks of general anesthetic including MI, CVA, sudden death or even reaction to anesthetic medications also discussed. Alternatives include continued observation.  Benefits include possible symptom relief, prevention of complications including acute cholecystitis, pancreatitis.  Typical post operative recovery of 3-5 days rest, continued pain in area and incision sites, possible loose stools up to 4-6 weeks, also discussed.  Patient's family members and patient himself verbalized understanding and due to his advanced age and concerns from recovery from surgery they requested to pursue the alternative antibiotic treatment to see if symptoms improve along with a bowel regimen to relieve some of his constipation.  Will admit to floor for observation IV antibiotics and bowel regimen to see if there is an improvement in the symptoms.  If there is no improvement or possible worsening of symptoms we will reconsider lap chole at that time once again.

## 2017-12-27 NOTE — Care Management Obs Status (Signed)
[facility] [place] NOTIFICATION   Patient Details  Name: Jose [name] MRN: [id number] Date of Birth: 1923/09/02   Medicare Observation Status Notification Given:  Yes    Shawntee Mainwaring A Keaton Beichner, RN 12/27/2017, 3:02 PM

## 2017-12-27 NOTE — Progress Notes (Signed)
Patient has fever of 102 oral.  Dr Lysle Pearl notified.  Order given for tylenol and blood cultures

## 2017-12-27 NOTE — Progress Notes (Signed)
Pharmacy Antibiotic Note  Jose [name] is a 84 y.o. male admitted on 12/27/2017 with an intra-abdominal infection due to cholecystitis  Pharmacy has been consulted for Zosyn dosing. Patient and family are considering cholecystectomy but want medical management first.  Plan: Zosyn 3.375g IV q8h (4 hour infusion).  Height: 5\' 7"  (170.2 cm) Weight: 145 lb (65.8 kg) IBW/kg (Calculated) : 66.1  Temp (24hrs), Avg:100.1 F (37.8 C), Min:97.9 F (36.6 C), Max:102 F (38.9 C)  Recent Labs  Lab 12/27/17 0433  WBC 7.5  CREATININE 0.75    Estimated Creatinine Clearance: 54.8 mL/min (by C-G formula based on SCr of 0.75 mg/dL).    No Known Allergies  Antimicrobials this admission: Ceftriaxone  8/10  Zosyn 8/10  >>   Thank you for allowing pharmacy to be a part of this patient's care.  Dallie Piles, PharmD 12/27/2017 8:01 PM

## 2017-12-27 NOTE — ED Notes (Signed)
Pt unsteady on feet with one assist. Walking several steps to toilet. Pt refuses to allow me to stay beside him as he uses restroom. Explained fall risk, etc. Pt and family refuse my presence assisting to restroom. Pt son assists, I stand outside of curtain as instructed by family. Futile attempts for pt and family to understand fall risk safety measures.

## 2017-12-27 NOTE — ED Notes (Signed)
Report to rebecca, rn.  

## 2017-12-27 NOTE — Progress Notes (Signed)
Family is requesting that the patient have something to help him with his confusion and restlessness.  Dr Lysle Pearl ordered a once of ativan

## 2017-12-27 NOTE — ED Notes (Signed)
Patient transported to Ultrasound 

## 2017-12-28 ENCOUNTER — Observation Stay: Payer: Medicare Other

## 2017-12-28 ENCOUNTER — Encounter: Payer: Self-pay | Admitting: Interventional Radiology

## 2017-12-28 DIAGNOSIS — K8063 Calculus of gallbladder and bile duct with acute cholecystitis with obstruction: Secondary | ICD-10-CM | POA: Diagnosis present

## 2017-12-28 DIAGNOSIS — R410 Disorientation, unspecified: Secondary | ICD-10-CM | POA: Diagnosis not present

## 2017-12-28 DIAGNOSIS — Z79899 Other long term (current) drug therapy: Secondary | ICD-10-CM | POA: Diagnosis not present

## 2017-12-28 DIAGNOSIS — I10 Essential (primary) hypertension: Secondary | ICD-10-CM | POA: Diagnosis present

## 2017-12-28 DIAGNOSIS — K219 Gastro-esophageal reflux disease without esophagitis: Secondary | ICD-10-CM | POA: Diagnosis present

## 2017-12-28 DIAGNOSIS — N400 Enlarged prostate without lower urinary tract symptoms: Secondary | ICD-10-CM | POA: Diagnosis present

## 2017-12-28 DIAGNOSIS — R451 Restlessness and agitation: Secondary | ICD-10-CM | POA: Diagnosis not present

## 2017-12-28 DIAGNOSIS — Z85820 Personal history of malignant melanoma of skin: Secondary | ICD-10-CM | POA: Diagnosis not present

## 2017-12-28 DIAGNOSIS — K811 Chronic cholecystitis: Secondary | ICD-10-CM | POA: Diagnosis present

## 2017-12-28 DIAGNOSIS — Z7982 Long term (current) use of aspirin: Secondary | ICD-10-CM | POA: Diagnosis not present

## 2017-12-28 DIAGNOSIS — M5412 Radiculopathy, cervical region: Secondary | ICD-10-CM | POA: Diagnosis present

## 2017-12-28 DIAGNOSIS — I700 Atherosclerosis of aorta: Secondary | ICD-10-CM | POA: Diagnosis present

## 2017-12-28 DIAGNOSIS — Z7952 Long term (current) use of systemic steroids: Secondary | ICD-10-CM | POA: Diagnosis not present

## 2017-12-28 DIAGNOSIS — Z791 Long term (current) use of non-steroidal anti-inflammatories (NSAID): Secondary | ICD-10-CM | POA: Diagnosis not present

## 2017-12-28 HISTORY — PX: IR PERC CHOLECYSTOSTOMY: IMG2326

## 2017-12-28 LAB — CBC
HCT: 31.6 % — ABNORMAL LOW (ref 40.0–52.0)
Hemoglobin: 11.3 g/dL — ABNORMAL LOW (ref 13.0–18.0)
MCH: 35.8 pg — ABNORMAL HIGH (ref 26.0–34.0)
MCHC: 35.9 g/dL (ref 32.0–36.0)
MCV: 99.7 fL (ref 80.0–100.0)
Platelets: 146 10*3/uL — ABNORMAL LOW (ref 150–440)
RBC: 3.17 MIL/uL — ABNORMAL LOW (ref 4.40–5.90)
RDW: 17.5 % — AB (ref 11.5–14.5)
WBC: 10.7 10*3/uL — AB (ref 3.8–10.6)

## 2017-12-28 LAB — BASIC METABOLIC PANEL
ANION GAP: 9 (ref 5–15)
BUN: 19 mg/dL (ref 8–23)
CHLORIDE: 103 mmol/L (ref 98–111)
CO2: 26 mmol/L (ref 22–32)
Calcium: 8.6 mg/dL — ABNORMAL LOW (ref 8.9–10.3)
Creatinine, Ser: 1.01 mg/dL (ref 0.61–1.24)
GFR calc non Af Amer: >60 mL/min (ref >60)
Glucose, Bld: 111 mg/dL — ABNORMAL HIGH (ref 70–99)
Potassium: 4.3 mmol/L (ref 3.5–5.1)
SODIUM: 138 mmol/L (ref 135–145)

## 2017-12-28 LAB — PROTIME-INR
INR: 1.46
PROTHROMBIN TIME: 17.6 s — AB (ref 11.4–15.2)

## 2017-12-28 LAB — LACTIC ACID, PLASMA
LACTIC ACID, VENOUS: 1.2 mmol/L (ref 0.5–1.9)
Lactic Acid, Venous: 1 mmol/L (ref 0.5–1.9)

## 2017-12-28 LAB — MAGNESIUM: MAGNESIUM: 1.8 mg/dL (ref 1.7–2.4)

## 2017-12-28 LAB — PHOSPHORUS: Phosphorus: 3.5 mg/dL (ref 2.5–4.6)

## 2017-12-28 MED ORDER — ACETAMINOPHEN 650 MG RE SUPP
650.0000 mg | Freq: Four times a day (QID) | RECTAL | Status: DC | PRN
  Administered 2017-12-28: 650 mg via RECTAL

## 2017-12-28 MED ORDER — LORAZEPAM 2 MG/ML IJ SOLN
0.5000 mg | INTRAMUSCULAR | Status: DC | PRN
  Administered 2017-12-28 – 2017-12-29 (×4): 0.5 mg via INTRAVENOUS
  Filled 2017-12-28 (×4): qty 1

## 2017-12-28 MED ORDER — TAMSULOSIN HCL 0.4 MG PO CAPS
0.4000 mg | ORAL_CAPSULE | Freq: Every day | ORAL | Status: DC
  Administered 2017-12-28 – 2017-12-30 (×3): 0.4 mg via ORAL
  Filled 2017-12-28 (×3): qty 1

## 2017-12-28 MED ORDER — FENTANYL CITRATE (PF) 100 MCG/2ML IJ SOLN
INTRAMUSCULAR | Status: AC
  Filled 2017-12-28: qty 2

## 2017-12-28 MED ORDER — FENTANYL CITRATE (PF) 100 MCG/2ML IJ SOLN
INTRAMUSCULAR | Status: AC | PRN
  Administered 2017-12-28 (×2): 50 ug via INTRAVENOUS

## 2017-12-28 MED ORDER — HALOPERIDOL LACTATE 5 MG/ML IJ SOLN
5.0000 mg | INTRAMUSCULAR | Status: AC
  Administered 2017-12-28: 5 mg via INTRAVENOUS
  Filled 2017-12-28 (×2): qty 1

## 2017-12-28 MED ORDER — MIDAZOLAM HCL 5 MG/5ML IJ SOLN
INTRAMUSCULAR | Status: AC
  Filled 2017-12-28: qty 5

## 2017-12-28 MED ORDER — SODIUM CHLORIDE 0.9% FLUSH
10.0000 mL | Freq: Two times a day (BID) | INTRAVENOUS | Status: DC
  Administered 2017-12-28 – 2017-12-30 (×5): 10 mL

## 2017-12-28 MED ORDER — IOPAMIDOL (ISOVUE-300) INJECTION 61%
30.0000 mL | Freq: Once | INTRAVENOUS | Status: AC | PRN
  Administered 2017-12-28: 5 mL

## 2017-12-28 MED ORDER — KCL IN DEXTROSE-NACL 40-5-0.45 MEQ/L-%-% IV SOLN
INTRAVENOUS | Status: DC
  Administered 2017-12-28 – 2017-12-29 (×2): via INTRAVENOUS
  Filled 2017-12-28 (×3): qty 1000

## 2017-12-28 MED ORDER — LORAZEPAM 2 MG/ML IJ SOLN
INTRAMUSCULAR | Status: AC | PRN
  Administered 2017-12-28: 1 mg via INTRAVENOUS

## 2017-12-28 MED ORDER — LORAZEPAM 2 MG/ML IJ SOLN
0.5000 mg | Freq: Once | INTRAMUSCULAR | Status: AC
Start: 2017-12-28 — End: 2017-12-28
  Administered 2017-12-28: 0.5 mg via INTRAVENOUS
  Filled 2017-12-28: qty 1

## 2017-12-28 NOTE — Progress Notes (Signed)
Patient continues to be restless. Dr Lysle Pearl ordered ativan once

## 2017-12-28 NOTE — Sedation Documentation (Signed)
Family updated as to patient's status.

## 2017-12-28 NOTE — Progress Notes (Signed)
Patient has become very agitated and restless.  He demands to go to the bathroom instead of using the bedside commode even though he is very weak.  After returning from the bathroom and not being able to void a bladder scan revealed 450 ml of urine in his bladder.  The patient only dribbles when he goes to the bathroom.  Order received for coude foley.  After coude placed the patient continues to be restless, agitated and trying to get up. Patient moaning so morphine was given but it did not help.  Order received from Dr Lysle Pearl for haldol.  It was then noted that the patient had a temp of 101.1. Order received for tylenol suppository

## 2017-12-28 NOTE — Progress Notes (Addendum)
Subjective:    Jose [name] is a 84 y.o. male  Hospital stay day 1,   acute cholecystitis  LATE ENTRY.  Patient seen today.  States comfortable but thinks he is at home.  Able to answer questions.  ROS:  A 5 point review of systems was performed and pertinent positives and negatives noted in HPI.  Objective:      Temp:  [98.4 F (36.9 C)-102 F (38.9 C)] 98.7 F (37.1 C) (08/11 1252) Pulse Rate:  [83-105] 90 (08/11 1252) Resp:  [18-27] 20 (08/11 1252) BP: (111-172)/(54-141) 139/71 (08/11 1252) SpO2:  [89 %-98 %] 89 % (08/11 1252) Weight:  [66.6 kg] 66.6 kg (08/11 0357)     Height: 5\' 7"  (170.2 cm) Weight: 66.6 kg BMI (Calculated): 22.99   Intake/Output this shift:  Total I/O In: 5 [Other:10; IV Piggyback:50] Out: 130 [Drains:130]       Constitutional :  cooperative and appears stated age  Respiratory:  clear to auscultation bilaterally  Cardiovascular:  regular rate and rhythm  Gastrointestinal: soft, but persistent tenderness in RUQ, no guarding or rebound.   Skin: Cool and moist.   Psychiatric: Normal affect, non-agitated, not confused       LABS:  CMP Latest Ref Rng & Units 12/28/2017 12/27/2017 05/07/2017  Glucose 70 - 99 mg/dL 111(H) 146(H) 79  BUN 8 - 23 mg/dL 19 19 17   Creatinine 0.61 - 1.24 mg/dL 1.01 0.75 0.93  Sodium 135 - 145 mmol/L 138 143 140  Potassium 3.5 - 5.1 mmol/L 4.3 3.7 4.3  Chloride 98 - 111 mmol/L 103 108 106  CO2 22 - 32 mmol/L 26 26 27   Calcium 8.9 - 10.3 mg/dL 8.6(L) 9.3 9.3  Total Protein 6.5 - 8.1 g/dL - 7.0 7.1  Total Bilirubin 0.3 - 1.2 mg/dL - 0.9 0.7  Alkaline Phos 38 - 126 U/L - 76 73  AST 15 - 41 U/L - 19 12  ALT 0 - 44 U/L - 18 14   CBC Latest Ref Rng & Units 12/28/2017 12/27/2017 05/07/2017  WBC 3.8 - 10.6 K/uL 10.7(H) 7.5 6.6  Hemoglobin 13.0 - 18.0 g/dL 11.3(L) 12.4(L) 13.9  Hematocrit 40.0 - 52.0 % 31.6(L) 34.7(L) 40.8  Platelets 150 - 440 K/uL 146(L) 157 164.0    RADS: n/a Assessment:   Acute calculous  cholecystitis.  Due to increasing confusion slightly higher white count and a fever of 102 I believe that is essential the patient gets urgent gallbladder drainage by IR.  Still remains a high risk from lap chole and with the addition of a sepsis type picture I recommended IR procedure for temporary stabilization.  I explained this in detail to patient's family members who are at bedside they all verbalized understanding and agree with plan we will continue to monitor closely post drain placement.  NPO for the rest of the day postprocedure and will reassess in the morning we will switch LR to maintenance IV fluids.

## 2017-12-28 NOTE — Procedures (Signed)
Interventional Radiology Procedure Note  Procedure: Percutaneous cholecystostomy  Complications: None  Estimated Blood Loss: < 10 mL  Findings: 10 Fr drain placed in gallbladder lumen and connected to gravity bag. Contrast injection shows filling defects from large calculi near GB neck.  Some contrast does exit cystic duct and enter CBD. Bile sample sent for culture.  Venetia Night. Kathlene Cote, M.D Pager:  [phone]

## 2017-12-28 NOTE — Progress Notes (Signed)
Interventional Radiology  Called by Dr. Lysle Pearl regarding percutaneous cholecystostomy to treat acute cholecystitis.  Patient worsening clinically with high fevers and delirium.  CT and [place] consistent with acute calculous cholecystitis.  Not candidate for cholecystectomy currently.  Discussed procedure with patient's son and daughter in law. Risks and benefits discussed with them including, but not limited to bleeding, infection, gallbladder perforation, bile leak, sepsis or even death. All of the family's questions were answered and informed consent obtained from the son. Consent signed and in chart.  Will proceed with cholecystostomy tube placement today.   Venetia Night. Kathlene Cote, M.D Pager:  [phone]

## 2017-12-29 ENCOUNTER — Telehealth: Payer: Self-pay

## 2017-12-29 LAB — BASIC METABOLIC PANEL
Anion gap: 8 (ref 5–15)
BUN: 17 mg/dL (ref 8–23)
CALCIUM: 8.5 mg/dL — AB (ref 8.9–10.3)
CO2: 22 mmol/L (ref 22–32)
CREATININE: 0.89 mg/dL (ref 0.61–1.24)
Chloride: 106 mmol/L (ref 98–111)
Glucose, Bld: 126 mg/dL — ABNORMAL HIGH (ref 70–99)
Potassium: 3.8 mmol/L (ref 3.5–5.1)
Sodium: 136 mmol/L (ref 135–145)

## 2017-12-29 LAB — CBC WITH DIFFERENTIAL/PLATELET
Basophils Absolute: 0 10*3/uL (ref 0–0.1)
Basophils Relative: 0 %
EOS PCT: 0 %
Eosinophils Absolute: 0 10*3/uL (ref 0–0.7)
HEMATOCRIT: 31.1 % — AB (ref 40.0–52.0)
Hemoglobin: 10.9 g/dL — ABNORMAL LOW (ref 13.0–18.0)
LYMPHS ABS: 0.8 10*3/uL — AB (ref 1.0–3.6)
Lymphocytes Relative: 9 %
MCH: 35 pg — AB (ref 26.0–34.0)
MCHC: 35.2 g/dL (ref 32.0–36.0)
MCV: 99.6 fL (ref 80.0–100.0)
MONO ABS: 0.9 10*3/uL (ref 0.2–1.0)
MONOS PCT: 11 %
NEUTROS ABS: 7.1 10*3/uL — AB (ref 1.4–6.5)
Neutrophils Relative %: 80 %
Platelets: 128 10*3/uL — ABNORMAL LOW (ref 150–440)
RBC: 3.12 MIL/uL — ABNORMAL LOW (ref 4.40–5.90)
RDW: 17.4 % — AB (ref 11.5–14.5)
WBC: 8.9 10*3/uL (ref 3.8–10.6)

## 2017-12-29 LAB — HEPATIC FUNCTION PANEL
ALT: 20 U/L (ref 0–44)
AST: 31 U/L (ref 15–41)
Albumin: 3.5 g/dL (ref 3.5–5.0)
Alkaline Phosphatase: 59 U/L (ref 38–126)
BILIRUBIN DIRECT: 0.4 mg/dL — AB (ref 0.0–0.2)
BILIRUBIN INDIRECT: 0.9 mg/dL (ref 0.3–0.9)
Total Bilirubin: 1.3 mg/dL — ABNORMAL HIGH (ref 0.3–1.2)
Total Protein: 6.3 g/dL — ABNORMAL LOW (ref 6.5–8.1)

## 2017-12-29 LAB — MAGNESIUM: Magnesium: 1.9 mg/dL (ref 1.7–2.4)

## 2017-12-29 LAB — PHOSPHORUS: PHOSPHORUS: 2.4 mg/dL — AB (ref 2.5–4.6)

## 2017-12-29 MED ORDER — GABAPENTIN 600 MG PO TABS
600.0000 mg | ORAL_TABLET | Freq: Two times a day (BID) | ORAL | Status: DC
  Administered 2017-12-29 – 2017-12-30 (×3): 600 mg via ORAL
  Filled 2017-12-29 (×3): qty 1

## 2017-12-29 MED ORDER — GABAPENTIN 300 MG PO CAPS: 300.0000 mg | ORAL_CAPSULE | Freq: Three times a day (TID) | ORAL | Status: DC

## 2017-12-29 NOTE — Telephone Encounter (Signed)
Per chart review tab pt was admitted to [facility]. 

## 2017-12-29 NOTE — Telephone Encounter (Signed)
Spoke to grandson in room Doing a little better after percutaneous cholescystotomy yesterday. Current plan would be to keep this in for 4-6 weeks and then pursus cholecystectomy at [facility] when more stable

## 2017-12-29 NOTE — Evaluation (Signed)
Physical Therapy Evaluation Patient Details Name: Jose [name] MRN: [id number] DOB: Aug 17, 1923 Today's Date: 2020/10/1317   History of Present Illness  84 y.o. male admitted on 12/27/2017 with an intra-abdominal infection due to cholecystitis, percutaneous cholescystotomy 12/28/17 PMH of GERD, back pain, cervical radiculopathy, HTN, melanoma in situ 8/18  Clinical Impression  Patient able to state his name, but overall disoriented during session, lethargic, and different from baseline cognitive functioning per family in room. Patient is also [facility] and does not have his hearing aids.Per family, patient lives alone in 1 story house and ambulates with SPC, no assistance needed for ADLs, and supervision/min assist with IADLs typically. Patient was ambulating back to recliner at start of session with nursing tech and family member with 2 hand held assist. Able to open eyes but inconsistently follows commands, anxious, restless and impulsive throughout session. Ambulated with PT and nursing with 2 hand held assist in room, decreased speed, stride length, foot clearance b/l, shuffling step, eyes closed during ambulation. Sit<> stand transfer x3 during session with CGA/minAx1 from low surface. Overall the patient demonstrates a change from PLOF including deficits in gait, mobility, balance, activity tolerance, cognition, and endurance and would benefit from further skilled PT to address these changes. Current recommendation is HHPT with 24/7 supervision which family insists they can provide. The son reports himself, his wife, his son, and that they are looking into hiring an aide.    Follow Up Recommendations Home health PT;Supervision/Assistance - 24 hour    Equipment Recommendations  Rolling walker with 5" wheels    Recommendations for Other Services       Precautions / Restrictions Precautions Precautions: Fall Restrictions Weight Bearing Restrictions: No      Mobility  Bed Mobility                General bed mobility comments: Patient up with sitter and family at start of session  Transfers Overall transfer level: Needs assistance   Transfers: Sit to/from Stand Sit to Stand: Min guard;Min assist         General transfer comment: x3 during session, uses arm rails when available. CGA from bedside commode, minAx1 from recliner  Ambulation/Gait Ambulation/Gait assistance: +2 physical assistance;Min assist Gait Distance (Feet): 18 Feet Assistive device: 2 person hand held assist       General Gait Details: decreased speed, stride length, foot clearance b/l, shuffling step, eyes closed during ambulation  Stairs            Wheelchair Mobility    Modified Rankin (Stroke Patients Only)       Balance Overall balance assessment: Needs assistance   Sitting balance-Leahy Scale: Fair       Standing balance-Leahy Scale: Poor                               Pertinent Vitals/Pain Pain Assessment: Faces Faces Pain Scale: Hurts a little bit Pain Location: stomach, HA Pain Descriptors / Indicators: Discomfort;Grimacing;Headache Pain Intervention(s): Limited activity within patient's tolerance;Monitored during session;Repositioned    Home Living Family/patient expects to be discharged to:: Private residence Living Arrangements: Alone Available Help at Discharge: Family;Personal care attendant Type of Home: House Home Access: Ramped entrance     Home Layout: One level Home Equipment: [place] - single point Additional Comments: Family reports that they are able to obtain and bedside commode, and most likely have a standard walker available as well.    Prior Function Level of  Independence: Independent with assistive device(s)         Comments: Ambulates with SPC at baseline, occasionally drives but most often needs assistance/supervision with IADLs.     Hand Dominance        Extremity/Trunk Assessment   Upper Extremity  Assessment Upper Extremity Assessment: Defer to OT evaluation;Difficult to assess due to impaired cognition    Lower Extremity Assessment Lower Extremity Assessment: Difficult to assess due to impaired cognition;Generalized weakness       Communication   Communication: HOH;Other (comment)(confused this AM, minimally verbalizes)  Cognition Arousal/Alertness: Lethargic Behavior During Therapy: Restless;Impulsive Overall Cognitive Status: Impaired/Different from baseline Area of Impairment: Orientation;Attention;Memory;Following commands;Safety/judgement;Awareness                 Orientation Level: Person Current Attention Level: Selective   Following Commands: Follows one step commands inconsistently Safety/Judgement: Decreased awareness of safety;Decreased awareness of deficits            General Comments      Exercises     Assessment/Plan    PT Assessment Patient needs continued PT services  PT Problem List Decreased strength;Decreased cognition;Decreased range of motion;Decreased knowledge of use of DME;Decreased activity tolerance;Decreased safety awareness;Decreased balance;Decreased mobility;Decreased coordination       PT Treatment Interventions DME instruction;Balance training;Gait training;Neuromuscular re-education;Stair training;Functional mobility training;Patient/family education;Therapeutic activities;Therapeutic exercise    PT Goals (Current goals can be found in the Care Plan section)  Acute Rehab PT Goals Patient Stated Goal: Patient verbalized that he is ready to go home PT Goal Formulation: With patient/family Time For Goal Achievement: 01/12/18 Potential to Achieve Goals: Good    Frequency Min 2X/week   Barriers to discharge        Co-evaluation               AM-PAC PT "6 Clicks" Daily Activity  Outcome Measure Difficulty turning over in bed (including adjusting bedclothes, sheets and blankets)?: A Little Difficulty moving from  lying on back to sitting on the side of the bed? : Unable Difficulty sitting down on and standing up from a chair with arms (e.g., wheelchair, bedside commode, etc,.)?: Unable Help needed moving to and from a bed to chair (including a wheelchair)?: A Lot Help needed walking in hospital room?: A Lot Help needed climbing 3-5 steps with a railing? : Total 6 Click Score: 10    End of Session Equipment Utilized During Treatment: Gait belt Activity Tolerance: Patient limited by lethargy Patient left: in chair;with nursing/sitter in room;with family/visitor present Nurse Communication: Mobility status PT Visit Diagnosis: Unsteadiness on feet (R26.81);Difficulty in walking, not elsewhere classified (R26.2);Other abnormalities of gait and mobility (R26.89);Muscle weakness (generalized) (M62.81)    Time: 0630-1601 PT Time Calculation (min) (ACUTE ONLY): 23 min   Charges:   PT Evaluation $PT Eval Low Complexity: 1 Low PT Treatments $Therapeutic Activity: 8-22 mins       Lieutenant Diego PT, DPT 11:57 AM,12/29/17 [phone]

## 2017-12-29 NOTE — Telephone Encounter (Signed)
PLEASE NOTE: All timestamps contained within this report are represented as [place] Standard Time. CONFIDENTIALTY NOTICE: This fax transmission is intended only for the addressee. It contains information that is legally privileged, confidential or otherwise protected from use or disclosure. If you are not the intended recipient, you are strictly prohibited from reviewing, disclosing, copying using or disseminating any of this information or taking any action in reliance on or regarding this information. If you have received this fax in error, please notify us immediately by telephone so that we can arrange for its return to [place]. Phone: [phone], Toll-Free: [phone], Fax: [phone] Page: 1 of 2 Call Id: [id number] [place] Patient Name: Jose [name] Gender: Male DOB: July 08, 1923 Age: 84 Y 32 M 12 D Return Phone Number: [phone] (Primary), [phone] (Secondary) Address: City/State/Zip: [place] [address] Client [facility] Night - Client Client Site [place] Physician Viviana Simpler - MD Contact Type Call Who Is Calling Patient / Member / Family / Caregiver Call Type Triage / Clinical Caller Name Bexley Mclester Return Phone Number [phone] (Primary) Chief Complaint Unclassified Symptom Reason for Call Symptomatic / Request for Branch states her father in law is currently in the hospital and she is needing to know if she can get a referral for a surgeon. He has gall stones. Translation No No Triage Reason Other Nurse Assessment Nurse: Kathi Ludwig, RN, Leana Roe Date/Time Eilene Ghazi Time): 12/27/2017 8:12:32 AM Confirm and document reason for call. If symptomatic, describe symptoms. ---Caller states father in law is in the ER. She insists on speaking with Dr. Silvio Pate. Patient went in with chest pain. US  shows that it is his gall bladder. ER is referring a Psychologist, sport and exercise, daughter in law wants Dr. Silvio Pate notified Does the patient have any new or worsening symptoms? ---Yes Will a triage be completed? ---No Select reason for no triage. ---Other Please document clinical information provided and list any resource used. ---Family insists on call be notified Guidelines Guideline Title Affirmed Question Affirmed Notes Nurse Date/Time ([place] Time) Disp. Time Eilene Ghazi Time) Disposition Final User 12/27/2017 8:17:30 AM Called On-Call Provider Kathi Ludwig, RN, Tracie 12/27/2017 8:25:40 AM Clinical Call Yes Kathi Ludwig, RN, Tracie Comments User: Estevan Ryder, RN Date/Time Eilene Ghazi Time): 12/27/2017 8:25:30 AM Family wants to speak to Dr. Emilia Beck directly PLEASE NOTE: All timestamps contained within this report are represented as [place] Standard Time. CONFIDENTIALTY NOTICE: This fax transmission is intended only for the addressee. It contains information that is legally privileged, confidential or otherwise protected from use or disclosure. If you are not the intended recipient, you are strictly prohibited from reviewing, disclosing, copying using or disseminating any of this information or taking any action in reliance on or regarding this information. If you have received this fax in error, please notify us immediately by telephone so that we can arrange for its return to [place]. Phone: [phone], Toll-Free: [phone], Fax: [phone] Page: 2 of 2 Call Id: [id number] Paging DoctorName Phone DateTime Result/Outcome Message Type Notes Billey Chang- MD [phone] 12/27/2017 8:17:30 AM Called On Call Provider - Reached Doctor Paged Billey Chang- MD 12/27/2017 8:24:51 AM Spoke with On Call - General Message Result Notified that patient is in ER and they are referring a sugeon for gall bladder. Daughter in law insists on speaking with PCP. Per Dr. Jonni Sanger, Dr. Silvio Pate will be notified through the ER  about the referral.

## 2017-12-29 NOTE — Progress Notes (Signed)
Per MD okay for RN to order PT consult.  

## 2017-12-29 NOTE — Progress Notes (Addendum)
Subjective:    Jose [name] is a 84 y.o. male  Hospital stay day 1,   acute cholecystitis  S/p percutaneous cholecystostomy tube.  Still getting confused overnight but doing better this am, oriented to place per family members.    ROS:  A 5 point review of systems was performed and pertinent positives and negatives noted in HPI. Objective:      Temp:  [97.5 F (36.4 C)-101.1 F (38.4 C)] 97.5 F (36.4 C) (08/12 0119) Pulse Rate:  [83-103] 103 (08/12 0119) Resp:  [18-27] 24 (08/12 0119) BP: (138-172)/(71-111) 139/96 (08/12 0119) SpO2:  [89 %-98 %] 97 % (08/12 0119)     Height: 5\' 7"  (170.2 cm) Weight: 66.6 kg BMI (Calculated): 22.99   Intake/Output this shift:  Total I/O In: 1201.3 [I.V.:1151.3; IV Piggyback:50] Out: 1405 [Urine:1150; Drains:255]       Constitutional :  cooperative and appears stated age  Respiratory:  clear to auscultation bilaterally  Cardiovascular:  regular rate and rhythm  Gastrointestinal: Soft, non-tender now, still slightly bloated, drain in place.   Skin: Cool and moist.   Psychiatric: Normal affect, non-agitated, not confused       LABS:  CMP Latest Ref Rng & Units 10-01-202019 12/28/2017 12/27/2017  Glucose 70 - 99 mg/dL 126(H) 111(H) 146(H)  BUN 8 - 23 mg/dL 17 19 19   Creatinine 0.61 - 1.24 mg/dL 0.89 1.01 0.75  Sodium 135 - 145 mmol/L 136 138 143  Potassium 3.5 - 5.1 mmol/L 3.8 4.3 3.7  Chloride 98 - 111 mmol/L 106 103 108  CO2 22 - 32 mmol/L 22 26 26   Calcium 8.9 - 10.3 mg/dL 8.5(L) 8.6(L) 9.3  Total Protein 6.5 - 8.1 g/dL 6.3(L) - 7.0  Total Bilirubin 0.3 - 1.2 mg/dL 1.3(H) - 0.9  Alkaline Phos 38 - 126 U/L 59 - 76  AST 15 - 41 U/L 31 - 19  ALT 0 - 44 U/L 20 - 18   CBC Latest Ref Rng & Units 10-01-202019 12/28/2017 12/27/2017  WBC 3.8 - 10.6 K/uL 8.9 10.7(H) 7.5  Hemoglobin 13.0 - 18.0 g/dL 10.9(L) 11.3(L) 12.4(L)  Hematocrit 40.0 - 52.0 % 31.1(L) 31.6(L) 34.7(L)  Platelets 150 - 440 K/uL 128(L) 146(L) 157    RADS: CLINICAL  DATA:  Sepsis and acute cholecystitis. Request to place percutaneous cholecystostomy tube.  EXAM: PERCUTANEOUS CHOLECYSTOSTOMY  COMPARISON:  CT and ultrasound studies on 12/27/2017  ANESTHESIA/SEDATION: 100 mcg IV Fentanyl.  Total Moderate Sedation Time  15 minutes.  The patient's level of consciousness and physiologic status were continuously monitored during the procedure by Radiology nursing.  CONTRAST:  24mL ISOVUE-300 injected into the gallbladder lumen  MEDICATIONS: No additional medications.  FLUOROSCOPY TIME:  30 seconds.  9.0 mGy.  PROCEDURE: The procedure, risks, benefits, and alternatives were explained to the patient's son. Questions regarding the procedure were encouraged and answered. The patient's son understands and consents to the procedure. A time-out was performed prior to initiating the procedure.  The right abdominal wall was prepped with chlorhexidine in a sterile fashion, and a sterile drape was applied covering the operative field. A sterile gown and sterile gloves were used for the procedure. Local anesthesia was provided with 1% Lidocaine. Ultrasound image documentation was performed. Fluoroscopy during the procedure and fluoro spot radiograph confirms appropriate catheter position.  Ultrasound was utilized to localize the gallbladder. Under direct ultrasound guidance, a 21 gauge needle was advanced via a transhepatic approach into the gallbladder lumen. Aspiration was performed and a bile sample sent for  culture studies. A small amount of diluted contrast material was injected. A guide wire was then advanced into the gallbladder. A transitional dilator was placed.  Percutaneous tract dilatation was then performed over a guide wire to 10-French. A 10-French pigtail drainage catheter was then advanced into the gallbladder lumen under fluoroscopy. Catheter was formed and injected with contrast material to confirm position.  The catheter was flushed and connected to a gravity drainage bag. It was secured at the skin with a Prolene retention suture and Stat-Lock device.  COMPLICATIONS: None  FINDINGS: After needle puncture of the gallbladder, a bile sample was aspirated and sent for culture. The cholecystostomy tube was advanced into the gallbladder lumen and formed. It is now draining bile. This tube will be left to gravity drainage. Contrast injection does show filling defects near the gallbladder neck consistent with calculi. After tube placement, contrast injection does demonstrate some patency of the cystic duct with contrast entering the cystic duct and common bile duct.  IMPRESSION: Percutaneous cholecystostomy with placement of 10-French drainage catheter into the gallbladder lumen. This was left to gravity drainage.   Electronically Signed   By: Aletta Edouard M.D.   On: 12/28/2017 12:50 Assessment:   Acute calculous cholecystitis. S/p percutaneous cholecystostomy tube placement by IR.  Doing well, wbc wnl, pain improved on exam today.    Will try to facilitate d/c to see if returning home will improve agitation/confusion. No obvious disease related pathology obvious at this point to explain the confusion.  D/c foley Resume all home meds(gabapentin does confirmed with family member) Per family request, will request [place] for drain management ADAT D/c IVF  Will f/u on cultures but likely will not need home abx.  Noted incidental increase in bilirubin level, maybe from procedure yesterday.  Will recheck in am

## 2017-12-29 NOTE — Telephone Encounter (Signed)
PLEASE NOTE: All timestamps contained within this report are represented as [place] Standard Time. CONFIDENTIALTY NOTICE: This fax transmission is intended only for the addressee. It contains information that is legally privileged, confidential or otherwise protected from use or disclosure. If you are not the intended recipient, you are strictly prohibited from reviewing, disclosing, copying using or disseminating any of this information or taking any action in reliance on or regarding this information. If you have received this fax in error, please notify us immediately by telephone so that we can arrange for its return to [place]. Phone: [phone], Toll-Free: [phone], Fax: [phone] Page: 1 of 1 Call Id: [id number] [place] Patient Name: Jose [name] Gender: Male DOB: [birth year] Age: 84 Y 1 M 13 D Return Phone Number: [phone] (Primary) Address: City/State/Zip: [place] [address] Client [facility] Night - Client Client Site [place] Physician Viviana Simpler - MD Contact Type Call Who Is Calling Patient / Member / Family / Caregiver Call Type Triage / Clinical Caller Name Quoc Tome Relationship To Patient Son Return Phone Number [phone] (Primary) Chief Complaint Paging or Request for Consult Reason for Call Symptomatic / Request for Health Information Initial Comment Caller wanting to speak to Dr. Silvio Pate re: his 55 yo dad. He is in [facility] with abdominal pain. Has infected gallbladder and trying to determine whther to drain or remove it. Translation No Nurse Assessment Nurse: Thurmond Butts, RN, Kirke Shaggy Date/Time (Eastern Time): 12/28/2017 9:51:14 AM Confirm and document reason for call. If symptomatic, describe symptoms. ---Patient is in [facility] with abdominal pain. Patient is 28, Dr. Silvio Pate has seen  patient form >20 years. They are very unsure about the treatment procedures. They are wanting Dr. Silvio Pate to be notified. Does the patient have any new or worsening symptoms? ---No Please document clinical information provided and list any resource used. ---I informed the family we don't have access to any physicians contact information except who is on call and they were notified yesterday. I explained Dr. Irving Copas will see the notification from the ER and from our notes sent over to the office on Monday but to call when the office opens Monday to discuss treatment procedures with him. They said okay. Guidelines Guideline Title Affirmed Question Affirmed Notes Nurse Date/Time ([place]astern Time) Disp. Time Eilene Ghazi Time) Disposition Final User 12/28/2017 9:55:11 AM Clinical Call Yes Thurmond Butts, RN, Kirke Shaggy

## 2017-12-29 NOTE — Telephone Encounter (Signed)
Discussed with son Will plan outpatient visit with [facility] after discharge and eventually schedule the cholecystectomy at [facility]

## 2017-12-29 NOTE — Progress Notes (Signed)
Called Dr. Lysle Pearl regarding anti-anxiety medication due to patient becoming increasingly anxious and impulsive, trying to get out of bed.  Order was placed for medication and Air cabin crew.  Mittens were placed to keep patient from pulling on tubing.  Christene Slates  06-10-2017  4:36 AM

## 2017-12-29 NOTE — Plan of Care (Signed)
Patient is restless and impulsive.  Safety sitter at bedside.  Jose [name]

## 2017-12-29 NOTE — Telephone Encounter (Addendum)
[name] calling back to speak with Dr Silvio Pate.  He has a new cell number:  [phone] Can reach anytime

## 2017-12-30 ENCOUNTER — Other Ambulatory Visit: Payer: Self-pay | Admitting: Internal Medicine

## 2017-12-30 DIAGNOSIS — K810 Acute cholecystitis: Secondary | ICD-10-CM

## 2017-12-30 DIAGNOSIS — T8579XA Infection and inflammatory reaction due to other internal prosthetic devices, implants and grafts, initial encounter: Secondary | ICD-10-CM

## 2017-12-30 LAB — CBC WITH DIFFERENTIAL/PLATELET
BASOS ABS: 0 10*3/uL (ref 0–0.1)
Basophils Relative: 1 %
Eosinophils Absolute: 0.2 10*3/uL (ref 0–0.7)
Eosinophils Relative: 3 %
HEMATOCRIT: 28 % — AB (ref 40.0–52.0)
HEMOGLOBIN: 10.1 g/dL — AB (ref 13.0–18.0)
LYMPHS PCT: 18 %
Lymphs Abs: 0.9 10*3/uL — ABNORMAL LOW (ref 1.0–3.6)
MCH: 35.7 pg — ABNORMAL HIGH (ref 26.0–34.0)
MCHC: 36 g/dL (ref 32.0–36.0)
MCV: 99.2 fL (ref 80.0–100.0)
MONOS PCT: 13 %
Monocytes Absolute: 0.7 10*3/uL (ref 0.2–1.0)
NEUTROS PCT: 65 %
Neutro Abs: 3.4 10*3/uL (ref 1.4–6.5)
Platelets: 112 10*3/uL — ABNORMAL LOW (ref 150–440)
RBC: 2.83 MIL/uL — ABNORMAL LOW (ref 4.40–5.90)
RDW: 17.6 % — ABNORMAL HIGH (ref 11.5–14.5)
WBC: 5.2 10*3/uL (ref 3.8–10.6)

## 2017-12-30 LAB — BASIC METABOLIC PANEL
Anion gap: 4 — ABNORMAL LOW (ref 5–15)
BUN: 22 mg/dL (ref 8–23)
CALCIUM: 8.3 mg/dL — AB (ref 8.9–10.3)
CO2: 24 mmol/L (ref 22–32)
CREATININE: 0.88 mg/dL (ref 0.61–1.24)
Chloride: 108 mmol/L (ref 98–111)
GFR calc Af Amer: >60 mL/min (ref >60)
GFR calc non Af Amer: >60 mL/min (ref >60)
GLUCOSE: 108 mg/dL — AB (ref 70–99)
Potassium: 3.6 mmol/L (ref 3.5–5.1)
Sodium: 136 mmol/L (ref 135–145)

## 2017-12-30 LAB — HEPATIC FUNCTION PANEL
ALK PHOS: 49 U/L (ref 38–126)
ALT: 18 U/L (ref 0–44)
AST: 20 U/L (ref 15–41)
Albumin: 3.1 g/dL — ABNORMAL LOW (ref 3.5–5.0)
BILIRUBIN DIRECT: 0.2 mg/dL (ref 0.0–0.2)
BILIRUBIN TOTAL: 1 mg/dL (ref 0.3–1.2)
Indirect Bilirubin: 0.8 mg/dL (ref 0.3–0.9)
Total Protein: 5.9 g/dL — ABNORMAL LOW (ref 6.5–8.1)

## 2017-12-30 LAB — PHOSPHORUS: Phosphorus: 3.2 mg/dL (ref 2.5–4.6)

## 2017-12-30 LAB — MAGNESIUM: Magnesium: 2.2 mg/dL (ref 1.7–2.4)

## 2017-12-30 MED ORDER — SODIUM CHLORIDE 0.9% FLUSH: 10.0000 mL | Freq: Two times a day (BID) | INTRAVENOUS | 0 refills | Status: DC

## 2017-12-30 MED ORDER — MAGNESIUM HYDROXIDE 400 MG/5ML PO SUSP
30.0000 mL | Freq: Once | ORAL | Status: AC
  Administered 2017-12-30: 30 mL via ORAL
  Filled 2017-12-30: qty 30

## 2017-12-30 MED ORDER — FLEET ENEMA 7-19 GM/118ML RE ENEM
1.0000 | ENEMA | Freq: Once | RECTAL | Status: AC
  Administered 2017-12-30: 1 via RECTAL

## 2017-12-30 NOTE — Care Management Note (Signed)
Case Management Note  Patient Details  Name: Jose [name] MRN: [id number] Date of Birth: [birth year]   Patient to discharge today.  S/p IR guided percutaneous cystostomy tube placement .  Baseline patient lives at home alone.  Daughter in law (DIL) at beside for discharge planning.  DIL states that either her self or husband will be staying with the patient after discharge.  DIL has been educated by bedside RN on flushing drain. DIL requested PCS list.  RNCM provided. Home health services RN, PT, and aide ordered.  DIL agreeable and selected [facility].  Corene Cornea with [facility] notified of referral.  RW delivered to room prior to discharge.  RNCM signing off.  Subjective/Objective:                    Action/Plan:   Expected Discharge Date:  12/30/17               Expected Discharge Plan:     In-House Referral:     Discharge planning Services  CM Consult  Post Acute Care Choice:  Home Health, Durable Medical Equipment Choice offered to:  Adult Children  DME Arranged:  Walker rolling DME Agency:  [place] Arranged:  RN, PT, Nurse's Aide [place] Agency:  [place]  Status of Service:  Completed, signed off  If discussed at [place] of Stay Meetings, dates discussed:    Additional Comments:  Beverly Sessions, RN 12/30/2017, 12:23 PM

## 2017-12-30 NOTE — Discharge Instructions (Signed)
Flush Biliary drain with 10 ML of normal saline every 12 hours and drain as needed. Measure every time the drain is empty and keep track of it.

## 2017-12-30 NOTE — Care Management (Signed)
RNCM called patient's son and daughter in law.  Spoke with daughter in [employer].  They are currently in the cafeteria.  Will follow up with them when they return

## 2017-12-30 NOTE — Progress Notes (Addendum)
Subjective:    Jose [name] is a 84 y.o. male  Hospital stay day 2,   acute cholecystitis  S/p percutaneous cholecystostomy tube.  Per sitter report, patient did much better last night re: confusion and pain control. Currently sleeping comfortably.  ROS:  A 5 point review of systems was performed and pertinent positives and negatives noted in HPI. Objective:      Temp:  [97.9 F (36.6 C)-98.4 F (36.9 C)] 97.9 F (36.6 C) (08/13 0605) Pulse Rate:  [87-105] 87 (08/13 0605) Resp:  [14-18] 16 (08/13 0605) BP: (112-133)/(70-79) 133/73 (08/13 0605) SpO2:  [92 %-96 %] 95 % (08/13 0605)     Height: 5\' 7"  (170.2 cm) Weight: 66.6 kg BMI (Calculated): 22.99   Intake/Output this shift:  Total I/O In: 10 [Other:10] Out: 530 [Urine:300; Drains:230]       Constitutional :  cooperative and appears stated age  Respiratory:  clear to auscultation bilaterally  Cardiovascular:  regular rate and rhythm  Gastrointestinal: Soft, non-tender now, still slightly bloated, drain in place.   Skin: Cool and moist.   Psychiatric: Normal affect, non-agitated, not confused       LABS:  CMP Latest Ref Rng & Units 12/30/2017 15-Apr-202019 12/28/2017  Glucose 70 - 99 mg/dL 108(H) 126(H) 111(H)  BUN 8 - 23 mg/dL 22 17 19   Creatinine 0.61 - 1.24 mg/dL 0.88 0.89 1.01  Sodium 135 - 145 mmol/L 136 136 138  Potassium 3.5 - 5.1 mmol/L 3.6 3.8 4.3  Chloride 98 - 111 mmol/L 108 106 103  CO2 22 - 32 mmol/L 24 22 26   Calcium 8.9 - 10.3 mg/dL 8.3(L) 8.5(L) 8.6(L)  Total Protein 6.5 - 8.1 g/dL 5.9(L) 6.3(L) -  Total Bilirubin 0.3 - 1.2 mg/dL 1.0 1.3(H) -  Alkaline Phos 38 - 126 U/L 49 59 -  AST 15 - 41 U/L 20 31 -  ALT 0 - 44 U/L 18 20 -   CBC Latest Ref Rng & Units 12/30/2017 15-Apr-202019 12/28/2017  WBC 3.8 - 10.6 K/uL 5.2 8.9 10.7(H)  Hemoglobin 13.0 - 18.0 g/dL 10.1(L) 10.9(L) 11.3(L)  Hematocrit 40.0 - 52.0 % 28.0(L) 31.1(L) 31.6(L)  Platelets 150 - 440 K/uL 112(L) 128(L) 146(L)    RADS: CLINICAL  DATA:  Sepsis and acute cholecystitis. Request to place percutaneous cholecystostomy tube.  EXAM: PERCUTANEOUS CHOLECYSTOSTOMY  COMPARISON:  CT and ultrasound studies on 12/27/2017  ANESTHESIA/SEDATION: 100 mcg IV Fentanyl.  Total Moderate Sedation Time  15 minutes.  The patient's level of consciousness and physiologic status were continuously monitored during the procedure by Radiology nursing.  CONTRAST:  39mL ISOVUE-300 injected into the gallbladder lumen  MEDICATIONS: No additional medications.  FLUOROSCOPY TIME:  30 seconds.  9.0 mGy.  PROCEDURE: The procedure, risks, benefits, and alternatives were explained to the patient's son. Questions regarding the procedure were encouraged and answered. The patient's son understands and consents to the procedure. A time-out was performed prior to initiating the procedure.  The right abdominal wall was prepped with chlorhexidine in a sterile fashion, and a sterile drape was applied covering the operative field. A sterile gown and sterile gloves were used for the procedure. Local anesthesia was provided with 1% Lidocaine. Ultrasound image documentation was performed. Fluoroscopy during the procedure and fluoro spot radiograph confirms appropriate catheter position.  Ultrasound was utilized to localize the gallbladder. Under direct ultrasound guidance, a 21 gauge needle was advanced via a transhepatic approach into the gallbladder lumen. Aspiration was performed and a bile sample sent for culture studies.  A small amount of diluted contrast material was injected. A guide wire was then advanced into the gallbladder. A transitional dilator was placed.  Percutaneous tract dilatation was then performed over a guide wire to 10-French. A 10-French pigtail drainage catheter was then advanced into the gallbladder lumen under fluoroscopy. Catheter was formed and injected with contrast material to confirm position.  The catheter was flushed and connected to a gravity drainage bag. It was secured at the skin with a Prolene retention suture and Stat-Lock device.  COMPLICATIONS: None  FINDINGS: After needle puncture of the gallbladder, a bile sample was aspirated and sent for culture. The cholecystostomy tube was advanced into the gallbladder lumen and formed. It is now draining bile. This tube will be left to gravity drainage. Contrast injection does show filling defects near the gallbladder neck consistent with calculi. After tube placement, contrast injection does demonstrate some patency of the cystic duct with contrast entering the cystic duct and common bile duct.  IMPRESSION: Percutaneous cholecystostomy with placement of 10-French drainage catheter into the gallbladder lumen. This was left to gravity drainage.   Electronically Signed   By: Aletta Edouard M.D.   On: 12/28/2017 12:50 Assessment:   Acute calculous cholecystitis. S/p percutaneous cholecystostomy tube placement by IR.  Doing well.  Okay to discharge from surgical standpoint once home health arrangements are in place.  Per patient family request patient will follow-up with a surgeon over in [place] for possible interval lap chole.  Drain management will also be provided by [facility] radiology over [place].  Noted incidental increase in bilirubin level- normal limits today.

## 2017-12-30 NOTE — Progress Notes (Signed)
[address] [place]  A and O x 2. VSS. Pt tolerating diet well. No complaints of pain or nausea. IV removed intact, prescriptions given to pt's faimily. Pt's family voiced understanding of discharge instructions with no further questions. RN provided education how to take care of the biliary tube and also how to empty. Also provided them with supplies. Pt discharged via wheelchair with axillary.    Allergies as of 12/30/2017   No Known Allergies     Medication List    TAKE these medications   aspirin 81 MG tablet Take 81 mg by mouth daily.   ferrous sulfate 325 (65 FE) MG EC tablet Take 325 mg by mouth daily with breakfast.   gabapentin 600 MG tablet Commonly known as:  NEURONTIN TAKE 1 TABLET (600 MG TOTAL) BY MOUTH 2 (TWO) TIMES DAILY. What changed:    See the new instructions.  Another medication with the same name was removed. Continue taking this medication, and follow the directions you see here.   ibuprofen 200 MG tablet Commonly known as:  ADVIL,MOTRIN Take 200-400 mg by mouth 3 (three) times daily as needed (Knee pain).   ketoconazole 2 % cream Commonly known as:  NIZORAL Apply 1 application topically 2 (two) times daily as needed for irritation.   sodium chloride flush 0.9 % Soln Commonly known as:  NS 10 mLs by Intracatheter route every 12 (twelve) hours.   tamsulosin 0.4 MG Caps capsule Commonly known as:  FLOMAX TAKE 1 CAPSULE (0.4 MG TOTAL) BY MOUTH DAILY. What changed:  See the new instructions.   triamcinolone cream 0.1 % Commonly known as:  KENALOG Apply 1 application topically 2 (two) times daily as needed.   vitamin B-12 500 MCG tablet Commonly known as:  CYANOCOBALAMIN Take 500 mcg by mouth daily.            Durable Medical Equipment  (From admission, onward)         Start     Ordered   12/30/17 1120  For home use only DME Walker rolling  Once    Question:  Patient needs a walker to treat with the following condition  Answer:  Weakness    12/30/17 1120          Vitals:   12/30/17 0605 12/30/17 1200  BP: 133/73 114/66  Pulse: 87 78  Resp: 16 14  Temp: 97.9 F (36.6 C) 97.8 F (36.6 C)  SpO2: 95% 95%    Jose [name]

## 2017-12-30 NOTE — Progress Notes (Signed)
Per MD okay for RN to place one time order for a fleet enema and milk of magnesia.

## 2017-12-30 NOTE — Discharge Summary (Signed)
Physician Discharge Summary  Patient ID: Jose [name] MRN: [id number] DOB/AGE: [birth year] 84 y.o.  Admit date: 12/27/2017 Discharge date: 12/30/2017  Admission Diagnoses: acute cholecystitis  Discharge Diagnoses:  Same as above  Discharged Condition: good  Hospital Course: Patient admitted through the ED for possible acute cholecystitis.  Due to advanced age and comorbidities decision will was made to attempt antibiotic therapy.  Unfortunately patient developed fever and increased confusion as well as a nuchal cytosis despite antibiotic treatment.  Therefore arrangement was made for a IR guided percutaneous cystostomy tube placement which was successful.  Patient was monitored postprocedure and showed continued improvement in both lab work and mentation status.  At the time of discharge patient was comfortable drain in place tolerating diet voiding and having bowel movements as needed.  Per patient family request we will arrange home health care for drain care and have follow-up with his primary care provider to arrange possible interval lap chole with a surgeon in [place].  Consults: IR  Discharge Exam: Blood pressure 133/73, pulse 87, temperature 97.9 F (36.6 C), temperature source Oral, resp. rate 16, height 5\' 7"  (1.702 m), weight 66.6 kg, SpO2 95 %. General appearance: cooperative and no distress GI: soft, non-tender; bowel sounds normal; no masses,  no organomegaly cholecystostomy tube in place, draining bile  Disposition:  Discharge disposition: 01-Home or Self Care        Allergies as of 12/30/2017   No Known Allergies     Medication List    TAKE these medications   aspirin 81 MG tablet Take 81 mg by mouth daily.   ferrous sulfate 325 (65 FE) MG EC tablet Take 325 mg by mouth daily with breakfast.   gabapentin 600 MG tablet Commonly known as:  NEURONTIN TAKE 1 TABLET (600 MG TOTAL) BY MOUTH 2 (TWO) TIMES DAILY. What changed:    See the new  instructions.  Another medication with the same name was removed. Continue taking this medication, and follow the directions you see here.   ibuprofen 200 MG tablet Commonly known as:  ADVIL,MOTRIN Take 200-400 mg by mouth 3 (three) times daily as needed (Knee pain).   ketoconazole 2 % cream Commonly known as:  NIZORAL Apply 1 application topically 2 (two) times daily as needed for irritation.   sodium chloride flush 0.9 % Soln Commonly known as:  NS 10 mLs by Intracatheter route every 12 (twelve) hours.   tamsulosin 0.4 MG Caps capsule Commonly known as:  FLOMAX TAKE 1 CAPSULE (0.4 MG TOTAL) BY MOUTH DAILY. What changed:  See the new instructions.   triamcinolone cream 0.1 % Commonly known as:  KENALOG Apply 1 application topically 2 (two) times daily as needed.   vitamin B-12 500 MCG tablet Commonly known as:  CYANOCOBALAMIN Take 500 mcg by mouth daily.      Follow-up Information    [name], MD Follow up.   Specialties:  Internal Medicine, Pediatrics Contact information: [place] [address] [place]. Schedule an appointment as soon as possible for a visit.   Why:  Please call prior to discharge to obtain drain care instructions and for followup appointment Contact information: [address] [place] [address] [phone]            Total time spent arranging discharge was >53min. Signed: Benjamine Sprague 12/30/2017, 6:45 AM

## 2017-12-31 ENCOUNTER — Telehealth: Payer: Self-pay | Admitting: *Deleted

## 2017-12-31 NOTE — Telephone Encounter (Signed)
Transition Care Management Follow-up Telephone Call TCM completed by pts daughter in law [name]   Date discharged? 12/30/2017   How have you been since you were released from the hospital? "he has been super!"   Do you understand why you were in the hospital? yes   Do you understand the discharge instructions? yes   Where were you discharged to? home   Items Reviewed:  Medications reviewed: yes  Allergies reviewed: yes  Dietary changes reviewed: yes  Referrals reviewed: yes   Functional Questionnaire:   Activities of Daily Living (ADLs):   He states they are independent in the following: independent in all areas    Any transportation issues/concerns?: no   Any patient concerns?no   Confirmed importance and date/time of follow-up visits scheduled yes  Provider Appointment booked with Dr Silvio Pate 8/19  Confirmed with patient if condition begins to worsen call PCP or go to the ER.  Patient was given the office number and encouraged to call back with question or concerns.  : yes

## 2018-01-01 ENCOUNTER — Telehealth: Payer: Self-pay | Admitting: Internal Medicine

## 2018-01-01 LAB — CULTURE, BLOOD (ROUTINE X 2)
CULTURE: NO GROWTH
Culture: NO GROWTH
SPECIAL REQUESTS: ADEQUATE
SPECIAL REQUESTS: ADEQUATE

## 2018-01-01 NOTE — Telephone Encounter (Signed)
Copied from [place] [id number]. Topic: General - Other >> Jan 01, 2018  3:53 PM Judyann Munson wrote: Reason for CRM: Amy- [facility]  Needing verbal orders for Nursing 1x for 4weeks And every other week for 5.   Cb# [phone]

## 2018-01-01 NOTE — Telephone Encounter (Signed)
That is fine 

## 2018-01-02 LAB — AEROBIC/ANAEROBIC CULTURE W GRAM STAIN (SURGICAL/DEEP WOUND)

## 2018-01-02 LAB — AEROBIC/ANAEROBIC CULTURE (SURGICAL/DEEP WOUND): SPECIAL REQUESTS: NORMAL

## 2018-01-02 NOTE — Telephone Encounter (Signed)
Verbal orders given to Amy 

## 2018-01-04 ENCOUNTER — Encounter: Payer: Self-pay | Admitting: Radiology

## 2018-01-04 ENCOUNTER — Emergency Department: Payer: Medicare Other

## 2018-01-04 ENCOUNTER — Observation Stay
Admission: EM | Admit: 2018-01-04 | Discharge: 2018-01-05 | Disposition: A | Discharge status: Home / Self Care | Payer: Medicare Other | Attending: General Surgery | Admitting: General Surgery

## 2018-01-04 ENCOUNTER — Other Ambulatory Visit: Payer: Self-pay

## 2018-01-04 DIAGNOSIS — M1990 Unspecified osteoarthritis, unspecified site: Secondary | ICD-10-CM | POA: Diagnosis not present

## 2018-01-04 DIAGNOSIS — K8000 Calculus of gallbladder with acute cholecystitis without obstruction: Secondary | ICD-10-CM | POA: Diagnosis not present

## 2018-01-04 DIAGNOSIS — G629 Polyneuropathy, unspecified: Secondary | ICD-10-CM | POA: Insufficient documentation

## 2018-01-04 DIAGNOSIS — Z9889 Other specified postprocedural states: Secondary | ICD-10-CM | POA: Diagnosis not present

## 2018-01-04 DIAGNOSIS — K219 Gastro-esophageal reflux disease without esophagitis: Secondary | ICD-10-CM | POA: Insufficient documentation

## 2018-01-04 DIAGNOSIS — I10 Essential (primary) hypertension: Secondary | ICD-10-CM | POA: Insufficient documentation

## 2018-01-04 DIAGNOSIS — Z809 Family history of malignant neoplasm, unspecified: Secondary | ICD-10-CM | POA: Diagnosis not present

## 2018-01-04 DIAGNOSIS — R748 Abnormal levels of other serum enzymes: Secondary | ICD-10-CM | POA: Diagnosis not present

## 2018-01-04 DIAGNOSIS — M5136 Other intervertebral disc degeneration, lumbar region: Secondary | ICD-10-CM | POA: Diagnosis not present

## 2018-01-04 DIAGNOSIS — K4090 Unilateral inguinal hernia, without obstruction or gangrene, not specified as recurrent: Secondary | ICD-10-CM | POA: Diagnosis not present

## 2018-01-04 DIAGNOSIS — I700 Atherosclerosis of aorta: Secondary | ICD-10-CM | POA: Diagnosis not present

## 2018-01-04 DIAGNOSIS — Z66 Do not resuscitate: Secondary | ICD-10-CM | POA: Insufficient documentation

## 2018-01-04 DIAGNOSIS — N400 Enlarged prostate without lower urinary tract symptoms: Secondary | ICD-10-CM | POA: Diagnosis not present

## 2018-01-04 DIAGNOSIS — Z7982 Long term (current) use of aspirin: Secondary | ICD-10-CM | POA: Insufficient documentation

## 2018-01-04 DIAGNOSIS — Z9841 Cataract extraction status, right eye: Secondary | ICD-10-CM | POA: Diagnosis not present

## 2018-01-04 DIAGNOSIS — Z79899 Other long term (current) drug therapy: Secondary | ICD-10-CM | POA: Diagnosis not present

## 2018-01-04 DIAGNOSIS — K449 Diaphragmatic hernia without obstruction or gangrene: Secondary | ICD-10-CM | POA: Diagnosis not present

## 2018-01-04 DIAGNOSIS — D649 Anemia, unspecified: Secondary | ICD-10-CM | POA: Diagnosis not present

## 2018-01-04 DIAGNOSIS — N281 Cyst of kidney, acquired: Secondary | ICD-10-CM | POA: Insufficient documentation

## 2018-01-04 DIAGNOSIS — R1011 Right upper quadrant pain: Secondary | ICD-10-CM

## 2018-01-04 DIAGNOSIS — M5412 Radiculopathy, cervical region: Secondary | ICD-10-CM | POA: Diagnosis not present

## 2018-01-04 DIAGNOSIS — K5730 Diverticulosis of large intestine without perforation or abscess without bleeding: Secondary | ICD-10-CM | POA: Diagnosis not present

## 2018-01-04 DIAGNOSIS — Z85820 Personal history of malignant melanoma of skin: Secondary | ICD-10-CM | POA: Insufficient documentation

## 2018-01-04 LAB — LIPASE, BLOOD: Lipase: 28 U/L (ref 11–51)

## 2018-01-04 LAB — COMPREHENSIVE METABOLIC PANEL
ALBUMIN: 3.4 g/dL — AB (ref 3.5–5.0)
ALT: 94 U/L — ABNORMAL HIGH (ref 0–44)
ANION GAP: 7 (ref 5–15)
AST: 68 U/L — ABNORMAL HIGH (ref 15–41)
Alkaline Phosphatase: 111 U/L (ref 38–126)
BILIRUBIN TOTAL: 1.7 mg/dL — AB (ref 0.3–1.2)
BUN: 21 mg/dL (ref 8–23)
CALCIUM: 8.5 mg/dL — AB (ref 8.9–10.3)
CO2: 20 mmol/L — AB (ref 22–32)
Chloride: 113 mmol/L — ABNORMAL HIGH (ref 98–111)
Creatinine, Ser: 0.86 mg/dL (ref 0.61–1.24)
GFR calc non Af Amer: >60 mL/min (ref >60)
Glucose, Bld: 128 mg/dL — ABNORMAL HIGH (ref 70–99)
POTASSIUM: 3.2 mmol/L — AB (ref 3.5–5.1)
SODIUM: 140 mmol/L (ref 135–145)
TOTAL PROTEIN: 6.3 g/dL — AB (ref 6.5–8.1)

## 2018-01-04 LAB — CBC WITH DIFFERENTIAL/PLATELET
BASOS PCT: 2 %
Basophils Absolute: 0.1 10*3/uL (ref 0–0.1)
EOS ABS: 0.3 10*3/uL (ref 0–0.7)
Eosinophils Relative: 5 %
HCT: 30.9 % — ABNORMAL LOW (ref 40.0–52.0)
Hemoglobin: 10.9 g/dL — ABNORMAL LOW (ref 13.0–18.0)
LYMPHS ABS: 0.8 10*3/uL — AB (ref 1.0–3.6)
Lymphocytes Relative: 15 %
MCH: 35.3 pg — AB (ref 26.0–34.0)
MCHC: 35.4 g/dL (ref 32.0–36.0)
MCV: 99.7 fL (ref 80.0–100.0)
MONO ABS: 0.7 10*3/uL (ref 0.2–1.0)
MONOS PCT: 13 %
Neutro Abs: 3.6 10*3/uL (ref 1.4–6.5)
Neutrophils Relative %: 65 %
Platelets: 178 10*3/uL (ref 150–440)
RBC: 3.1 MIL/uL — ABNORMAL LOW (ref 4.40–5.90)
RDW: 16.5 % — AB (ref 11.5–14.5)
WBC: 5.5 10*3/uL (ref 3.8–10.6)

## 2018-01-04 LAB — TYPE AND SCREEN
ABO/RH(D): A NEG
ANTIBODY SCREEN: NEGATIVE

## 2018-01-04 LAB — TROPONIN I: Troponin I: <0.03 ng/mL (ref <0.03)

## 2018-01-04 LAB — PROTIME-INR
INR: 1.18
PROTHROMBIN TIME: 14.9 s (ref 11.4–15.2)

## 2018-01-04 MED ORDER — VITAMIN B-12 1000 MCG PO TABS
500.0000 ug | ORAL_TABLET | Freq: Every day | ORAL | Status: DC
  Administered 2018-01-04 – 2018-01-05 (×2): 500 ug via ORAL
  Filled 2018-01-04 (×2): qty 1

## 2018-01-04 MED ORDER — SODIUM CHLORIDE 0.9 % IV BOLUS
500.0000 mL | Freq: Once | INTRAVENOUS | Status: AC
  Administered 2018-01-04: 500 mL via INTRAVENOUS

## 2018-01-04 MED ORDER — IBUPROFEN 400 MG PO TABS: 200.0000 mg | ORAL_TABLET | Freq: Three times a day (TID) | ORAL | Status: DC | PRN

## 2018-01-04 MED ORDER — SIMETHICONE 80 MG PO CHEW
80.0000 mg | CHEWABLE_TABLET | Freq: Four times a day (QID) | ORAL | Status: DC | PRN
  Administered 2018-01-05: 80 mg via ORAL
  Filled 2018-01-04 (×2): qty 1

## 2018-01-04 MED ORDER — FERROUS SULFATE 325 (65 FE) MG PO TABS
325.0000 mg | ORAL_TABLET | Freq: Every day | ORAL | Status: DC
  Administered 2018-01-04 – 2018-01-05 (×2): 325 mg via ORAL
  Filled 2018-01-04 (×2): qty 1

## 2018-01-04 MED ORDER — MORPHINE SULFATE (PF) 2 MG/ML IV SOLN
2.0000 mg | Freq: Once | INTRAVENOUS | Status: DC
  Filled 2018-01-04: qty 1

## 2018-01-04 MED ORDER — GABAPENTIN 600 MG PO TABS
600.0000 mg | ORAL_TABLET | Freq: Two times a day (BID) | ORAL | Status: DC
  Administered 2018-01-04 – 2018-01-05 (×3): 600 mg via ORAL
  Filled 2018-01-04 (×3): qty 1

## 2018-01-04 MED ORDER — ACETAMINOPHEN 500 MG PO TABS
1000.0000 mg | ORAL_TABLET | Freq: Four times a day (QID) | ORAL | Status: DC | PRN
  Administered 2018-01-04: 1000 mg via ORAL
  Filled 2018-01-04 (×4): qty 2

## 2018-01-04 MED ORDER — ENOXAPARIN SODIUM 40 MG/0.4ML ~~LOC~~ SOLN
40.0000 mg | SUBCUTANEOUS | Status: DC
  Administered 2018-01-04: 40 mg via SUBCUTANEOUS
  Filled 2018-01-04: qty 0.4

## 2018-01-04 MED ORDER — ACETAMINOPHEN 500 MG PO TABS
1000.0000 mg | ORAL_TABLET | Freq: Once | ORAL | Status: AC
  Administered 2018-01-04: 1000 mg via ORAL
  Filled 2018-01-04: qty 2

## 2018-01-04 MED ORDER — TAMSULOSIN HCL 0.4 MG PO CAPS
0.4000 mg | ORAL_CAPSULE | Freq: Every day | ORAL | Status: DC
  Administered 2018-01-04 – 2018-01-05 (×2): 0.4 mg via ORAL
  Filled 2018-01-04 (×2): qty 1

## 2018-01-04 MED ORDER — SODIUM CHLORIDE 0.9% FLUSH
10.0000 mL | Freq: Two times a day (BID) | INTRAVENOUS | Status: DC
  Administered 2018-01-04 – 2018-01-05 (×3): 10 mL

## 2018-01-04 MED ORDER — IOPAMIDOL (ISOVUE-300) INJECTION 61%
100.0000 mL | Freq: Once | INTRAVENOUS | Status: AC | PRN
Start: 2018-01-04 — End: 2018-01-04
  Administered 2018-01-04: 100 mL via INTRAVENOUS

## 2018-01-04 MED ORDER — ASPIRIN 81 MG PO CHEW
81.0000 mg | CHEWABLE_TABLET | Freq: Every day | ORAL | Status: DC
  Administered 2018-01-04 – 2018-01-05 (×2): 81 mg via ORAL
  Filled 2018-01-04 (×2): qty 1

## 2018-01-04 NOTE — ED Triage Notes (Signed)
Patient had drainage tube (for bile) placed on 8/12.  Tonight woke family due to bleeding around tube (blood noted under dressing).  Patient reports pain around area.

## 2018-01-04 NOTE — Care Management Obs Status (Signed)
[facility] NOTIFICATION   Patient Details  Name: Jaheem Hedgepath MRN: [id number] Date of Birth: October 24, 1923   Medicare Observation Status Notification Given:  Yes    Josefina Rynders A Edgerrin Correia, RN 01/04/2018, 11:14 AM

## 2018-01-04 NOTE — ED Provider Notes (Signed)
[facility] Emergency Department Provider Note   ____________________________________________   First MD Initiated Contact with Patient 01/04/18 0405     (approximate)  I have reviewed the triage vital signs and the nursing notes.   HISTORY  Chief Complaint Post-op Problem    HPI Jose [name] is a 84 y.o. male who presents to the ED from home with a chief complaint of bloody percutaneous cholecystostomy tube.  Patient had recent hospital admission for acute cholecystitis.  Percutaneous cholecystostomy tube was placed on 8/12.  Patient and family state he has had no problems with it until this morning.  Yesterday was the first time family changed the bandage.  Thinks it started bleeding approximately 1:30 AM.  Family states tube has consistently drained approximately 20 to 30 cc/h yellow/green bile. Son last saw the tube around 1:30am and states tube does not look like it has drained more since then.  Patient complains of discomfort around tube insertion site.  Earlier this evening daughter state patient thought the tube was sticking him and was uncomfortable.  Patient denies fever, chills, chest pain, shortness of breath, nausea, vomiting.  Takes baby aspirin daily.   Past Medical History:  Diagnosis Date  . Back problem 1960  . BPH (benign prostatic hypertrophy)   . Cervical radiculopathy    with neuropathy  . GERD (gastroesophageal reflux disease)   . Hypertension   . Melanoma in situ [facility]) 12/2016   neck    Patient Active Problem List   Diagnosis Date Noted  . Acute cholecystitis 12/28/2017  . Cholecystitis, acute with cholelithiasis 12/27/2017  . Melanoma of neck ([place])   . Rash 09/05/2016  . Neuropathy 05/07/2016  . Anemia 05/11/2015  . Preventative health care 04/29/2014  . Advanced directives, counseling/discussion 04/29/2014  . Osteoarthrosis involving multiple sites 10/31/2010  . Osteoarthrosis of knee 10/31/2010  . ACTINIC  KERATOSIS 12/04/2007  . BPH with obstruction/lower urinary tract symptoms 10/01/2006  . Essential hypertension, benign 09/17/2006  . GERD 09/17/2006  . Cervical radiculopathy 09/17/2006    Past Surgical History:  Procedure Laterality Date  . CATARACT EXTRACTION     OD  . IR PERC CHOLECYSTOSTOMY  12/28/2017  . MELANOMA EXCISION  12/2016   in situ---Dr Dasher  . TONSILLECTOMY      Prior to Admission medications   Medication Sig Start Date End Date Taking? Authorizing Provider  acetaminophen (TYLENOL) 500 MG tablet Take 1,000 mg by mouth every 6 (six) hours as needed.   Yes [provider]  aspirin 81 MG tablet Take 81 mg by mouth daily.     Yes [provider]  ferrous sulfate 325 (65 FE) MG EC tablet Take 325 mg by mouth daily with breakfast.   Yes [provider]  gabapentin (NEURONTIN) 600 MG tablet TAKE 1 TABLET (600 MG TOTAL) BY MOUTH 2 (TWO) TIMES DAILY. Patient taking differently: Take 600 mg by mouth 2 (two) times daily.  07/21/17  Yes [name], MD  ibuprofen (ADVIL,MOTRIN) 200 MG tablet Take 200-400 mg by mouth 3 (three) times daily as needed (Knee pain).    Yes [provider]  ketoconazole (NIZORAL) 2 % cream Apply 1 application topically 2 (two) times daily as needed for irritation. 05/07/17  Yes Viviana Simpler I, MD  sodium chloride flush (NS) 0.9 % SOLN 10 mLs by Intracatheter route every 12 (twelve) hours. 12/30/17  Yes Sakai, Isami, DO  tamsulosin (FLOMAX) 0.4 MG CAPS capsule TAKE 1 CAPSULE (0.4 MG TOTAL) BY  MOUTH DAILY. Patient taking differently: Take 0.4 mg by mouth daily.  07/21/17  Yes Viviana Simpler I, MD  triamcinolone cream (KENALOG) 0.1 % Apply 1 application topically 2 (two) times daily as needed. 09/05/16  Yes [name], MD  vitamin B-12 (CYANOCOBALAMIN) 500 MCG tablet Take 500 mcg by mouth daily.   Yes [provider]    Allergies Patient has no known allergies.  Family History  Problem Relation  Age of Onset  . Cancer Son   . Coronary artery disease Neg Hx   . Diabetes Neg Hx     Social History Social History   Tobacco Use  . Smoking status: Never Smoker  . Smokeless tobacco: Never Used  Substance Use Topics  . Alcohol use: No  . Drug use: No    Review of Systems  Constitutional: No fever/chills Eyes: No visual changes. ENT: No sore throat. Cardiovascular: Denies chest pain. Respiratory: Denies shortness of breath. Gastrointestinal: Positive for abdominal pain.  Positive for bloody cholecystostomy drain. No nausea, no vomiting.  No diarrhea.  No constipation. Genitourinary: Negative for dysuria. Musculoskeletal: Negative for back pain. Skin: Negative for rash. Neurological: Negative for headaches, focal weakness or numbness.   ____________________________________________   PHYSICAL EXAM:  VITAL SIGNS: ED Triage Vitals [01/04/18 0352]  Enc Vitals Group     BP 136/74     Pulse Rate 89     Resp 20     Temp 98 F (36.7 C)     Temp Source Oral     SpO2 97 %     Weight      Height      Head Circumference      Peak Flow      Pain Score      Pain Loc      Pain Edu?      Excl. in [place]?     Constitutional: Alert and oriented. Well appearing and in mild acute distress. Eyes: Conjunctivae are normal. PERRL. EOMI. Head: Atraumatic. Nose: No congestion/rhinnorhea. Mouth/Throat: Mucous membranes are moist.  Oropharynx non-erythematous. Neck: No stridor.   Cardiovascular: Normal rate, regular rhythm. Grossly normal heart sounds.  Good peripheral circulation. Respiratory: Normal respiratory effort.  No retractions. Lungs CTAB. Gastrointestinal: Percutaneous cholecystostomy tube in place.  Bloody drainage in bag with tissue and blood clots.  Insertion site clean; no active bleeding.  Small clot around insertion site.  Soft and mildly tender to palpation around tube insertion site. No distention. No abdominal bruits. No CVA tenderness. Musculoskeletal: No lower  extremity tenderness nor edema.  No joint effusions. Neurologic:  Normal speech and language. No gross focal neurologic deficits are appreciated. No gait instability. Skin:  Skin is warm, dry and intact. No rash noted. Psychiatric: Mood and affect are normal. Speech and behavior are normal.  ____________________________________________   LABS (all labs ordered are listed, but only abnormal results are displayed)  Labs Reviewed  CBC WITH DIFFERENTIAL/PLATELET - Abnormal; Notable for the following components:      Result Value   RBC 3.10 (*)    Hemoglobin 10.9 (*)    HCT 30.9 (*)    MCH 35.3 (*)    RDW 16.5 (*)    Lymphs Abs 0.8 (*)    All other components within normal limits  COMPREHENSIVE METABOLIC PANEL - Abnormal; Notable for the following components:   Potassium 3.2 (*)    Chloride 113 (*)    CO2 20 (*)    Glucose, Bld 128 (*)    Calcium  8.5 (*)    Total Protein 6.3 (*)    Albumin 3.4 (*)    AST 68 (*)    ALT 94 (*)    Total Bilirubin 1.7 (*)    All other components within normal limits  LIPASE, BLOOD  TROPONIN I  PROTIME-INR  TYPE AND SCREEN   ____________________________________________  EKG  None ____________________________________________  RADIOLOGY  ED MD interpretation: Tube tip in appropriate location with adjacent hyperdensity most likely blood  Official radiology report(s): Ct Abdomen Pelvis W Contrast  Result Date: 01/04/2018 CLINICAL DATA:  Initial evaluation for cholecystostomy tube placement. Bleeding around to. EXAM: CT ABDOMEN AND PELVIS WITH CONTRAST TECHNIQUE: Multidetector CT imaging of the abdomen and pelvis was performed using the standard protocol following bolus administration of intravenous contrast. CONTRAST:  148mL ISOVUE-300 IOPAMIDOL (ISOVUE-300) INJECTION 61% COMPARISON:  Prior study from 12/28/2017. FINDINGS: Lower chest: Scattered atelectatic changes seen dependently within the lung bases bilaterally. Superimposed calcified  granuloma noted at the posterior left lower lobe. Hepatobiliary: Liver demonstrates a normal contrast enhanced appearance. Percutaneous cholecystostomy tube in place within the gallbladder. Distal aspects seen coiled appropriately within the gallbladder fundus. Hyperdensity within the adjacent gallbladder lumen likely reflects blood. Adjacent gallbladder wall is thickened with associated mucosal enhancement. Small amount of free pericholecystic fluid. No adjacent fluid collections to suggest biloma or leak. No other hematoma along the catheter tract. Mild intrahepatic biliary dilatation. Pancreas: Pancreas is atrophic without acute abnormality. Spleen: Spleen within normal limits. Adrenals/Urinary Tract: Adrenal glands are normal. Kidneys equal in size with symmetric enhancement. 2.1 cm left renal cyst. DISH in ule scattered subcentimeter hypodensities too small the characterize, but statistically likely reflects small cysts as well. No nephrolithiasis, hydronephrosis, or focal enhancing renal mass. No hydroureter. Bladder within normal limits. Stomach/Bowel: Small hiatal hernia noted. Stomach otherwise unremarkable. No evidence for bowel obstruction. Colonic diverticulosis without evidence for acute diverticulitis. No acute inflammatory changes seen about the bowels. Vascular/Lymphatic: Advanced aorto bi-iliac atherosclerotic disease. No aneurysm. Mesenteric vessels patent proximally. No adenopathy. Reproductive: Prostate normal. Other: Small fat containing left inguinal hernia. No free air or fluid. Musculoskeletal: No acute osseus abnormality. No worrisome lytic or blastic osseous lesions. Moderate multilevel degenerative spondylolysis seen throughout the lumbar spine. IMPRESSION: 1. Percutaneous cholecystostomy tube in place with tip appropriately position within the gallbladder lumen. Adjacent hyperdensity within the gallbladder lumen likely reflects associated hemorrhage. No evidence for leak or  malpositioning. Gallbladder wall inflamed in appearance. 2. No other acute intra-abdominal or pelvic process. 3. Aortic atherosclerosis. 4. Colonic diverticulosis without evidence for acute diverticulitis. Electronically Signed   By: Jeannine Boga M.D.   On: 01/04/2018 07:00    ____________________________________________   PROCEDURES  Procedure(s) performed: None  Procedures  Critical Care performed: Yes, see critical care note(s)   CRITICAL CARE Performed by: Paulette Blanch   Total critical care time: 30 minutes  Critical care time was exclusive of separately billable procedures and treating other patients.  Critical care was necessary to treat or prevent imminent or life-threatening deterioration.  Critical care was time spent personally by me on the following activities: development of treatment plan with patient and/or surrogate as well as nursing, discussions with consultants, evaluation of patient's response to treatment, examination of patient, obtaining history from patient or surrogate, ordering and performing treatments and interventions, ordering and review of laboratory studies, ordering and review of radiographic studies, pulse oximetry and re-evaluation of patient's condition.  ____________________________________________   INITIAL IMPRESSION / ASSESSMENT AND PLAN / ED COURSE  As part of  my medical decision making, I reviewed the following data within the [place] History obtained from family, Nursing notes reviewed and incorporated, Labs reviewed, EKG interpreted, Old chart reviewed, Radiograph reviewed and Notes from prior ED visits   84 year old male who presents with bleeding percutaneous cholecystostomy tube.  Differential diagnosis includes but is not limited to tube dislodgment, acute hemorrhage, worsening hepatic function with coagulopathy, etc.  Will obtain screening lab work including INR.  Nursing to flush tube.  Clinical Course  as of Jan 04 741  Nancy Fetter Jan 04, 2018  0610 Nursing flushed percutaneous cholecystostomy tube.  It is still draining approximately 20 to 30 cc bloody fluid.  Will discuss with surgeon on-call.  Noted mild elevation in LFTs.   [JS]  3005 Spoke with Dr. Peyton Najjar who is on-call for general surgery.  Recommend CT abdomen/pelvis with IV contrast only to evaluate percutaneous cholecystostomy tube placement.   [JS]  [id number] Updated Dr. Peyton Najjar of patient's CT scan results.  He will evaluate patient at bedside.   [JS]    Clinical Course User Index [JS] Paulette Blanch, MD     ____________________________________________   FINAL CLINICAL IMPRESSION(S) / ED DIAGNOSES  Final diagnoses:  Right upper quadrant abdominal pain     ED Discharge Orders    None       Note:  This document was prepared using Dragon voice recognition software and may include unintentional dictation errors.    Paulette Blanch, MD 01/08/18 [phone]

## 2018-01-04 NOTE — ED Notes (Signed)
Pt ambulatory to toilet to defecate.

## 2018-01-04 NOTE — H&P (Signed)
SURGICAL HISTORY AND PHYSICAL NOTE   HISTORY OF PRESENT ILLNESS (HPI):  84 y.o. male presented to [facility] ED for evaluation of his percutaneous cholecystostomy. He refers that yesterday he start seeing blood through the tube. Patient denies abdominal pain. He refers that drainage has been bloody. No pain radiation. Nothing improves or aggravates pain. Patient refers is tolerating diet without nausea and vomiting. Patient denies fever or chills.   Surgery is consulted by Dr. Beather Arbour in this context for evaluation and management of elevated liver enzymes and bloody drainage from percutaneous tube.  PAST MEDICAL HISTORY (PMH):  Past Medical History:  Diagnosis Date  . Back problem 1960  . BPH (benign prostatic hypertrophy)   . Cervical radiculopathy    with neuropathy  . GERD (gastroesophageal reflux disease)   . Hypertension   . Melanoma in situ ([place]) 12/2016   neck     PAST SURGICAL HISTORY [facility]):  Past Surgical History:  Procedure Laterality Date  . CATARACT EXTRACTION     OD  . IR PERC CHOLECYSTOSTOMY  12/28/2017  . MELANOMA EXCISION  12/2016   in situ---Dr Dasher  . TONSILLECTOMY       MEDICATIONS:  Prior to Admission medications   Medication Sig Start Date End Date Taking? Authorizing Provider  aspirin 81 MG tablet Take 81 mg by mouth daily.     Yes [provider]  ferrous sulfate 325 (65 FE) MG EC tablet Take 325 mg by mouth daily with breakfast.   Yes [provider]  gabapentin (NEURONTIN) 600 MG tablet TAKE 1 TABLET (600 MG TOTAL) BY MOUTH 2 (TWO) TIMES DAILY. Patient taking differently: Take 600 mg by mouth 2 (two) times daily.  07/21/17  Yes Viviana Simpler I, MD  sodium chloride flush (NS) 0.9 % SOLN 10 mLs by Intracatheter route every 12 (twelve) hours. 12/30/17  Yes Sakai, Isami, DO  tamsulosin (FLOMAX) 0.4 MG CAPS capsule TAKE 1 CAPSULE (0.4 MG TOTAL) BY MOUTH DAILY. Patient taking differently: Take 0.4 mg by mouth daily.  07/21/17  Yes [name],  MD  vitamin B-12 (CYANOCOBALAMIN) 500 MCG tablet Take 500 mcg by mouth daily.   Yes [provider]  acetaminophen (TYLENOL) 500 MG tablet Take 1,000 mg by mouth every 6 (six) hours as needed.    [provider]  ibuprofen (ADVIL,MOTRIN) 200 MG tablet Take 200-400 mg by mouth 3 (three) times daily as needed (Knee pain).     [provider]  ketoconazole (NIZORAL) 2 % cream Apply 1 application topically 2 (two) times daily as needed for irritation. 05/07/17   [name], MD  triamcinolone cream (KENALOG) 0.1 % Apply 1 application topically 2 (two) times daily as needed. 09/05/16   [name], MD     ALLERGIES:  No Known Allergies   SOCIAL HISTORY:  Social History   Socioeconomic History  . Marital status: Widowed    Spouse name: Not on file  . Number of children: 2  . Years of education: Not on file  . Highest education level: Not on file  Occupational History  . Occupation: retiredOccupational psychologist [employer]    Employer: RETIRED  Social Needs  . Financial resource strain: Not on file  . Food insecurity:    Worry: Not on file    Inability: Not on file  . Transportation needs:    Medical: Not on file    Non-medical: Not on file  Tobacco Use  . Smoking status: Never Smoker  . Smokeless tobacco:  Never Used  Substance and Sexual Activity  . Alcohol use: No  . Drug use: No  . Sexual activity: Not on file  Lifestyle  . Physical activity:    Days per week: Not on file    Minutes per session: Not on file  . Stress: Not on file  Relationships  . Social connections:    Talks on phone: Patient refused    Gets together: Patient refused    Attends religious service: Patient refused    Active member of club or organization: Patient refused    Attends meetings of clubs or organizations: Patient refused    Relationship status: Patient refused  . Intimate partner violence:    Fear of current or ex partner: Patient refused    Emotionally abused:  Patient refused    Physically abused: Patient refused    Forced sexual activity: Patient refused  Other Topics Concern  . Not on file  Social History Narrative   Has living will   Son Mikeal Hawthorne is health care POA.     Requests DNR--done 05/07/16   No feeding tube if cognitively unaware     FAMILY HISTORY:  Family History  Problem Relation Age of Onset  . Cancer Son   . Coronary artery disease Neg Hx   . Diabetes Neg Hx      REVIEW OF SYSTEMS:  Constitutional: denies weight loss, fever, chills, or sweats  Eyes: denies any other vision changes, history of eye injury  ENT: denies sore throat, hearing problems  Respiratory: denies shortness of breath, wheezing  Cardiovascular: denies chest pain, palpitations  Gastrointestinal: denies abdominal pain, N/V, or diarrhea Genitourinary: denies burning with urination or urinary frequency Musculoskeletal: denies any other joint pains or cramps  Skin: denies any other rashes or skin discolorations  Neurological: denies any other headache, dizziness, weakness  Psychiatric: denies any other depression, anxiety   All other review of systems were negative   VITAL SIGNS:  Temp:  [97.6 F (36.4 C)-98.4 F (36.9 C)] 97.6 F (36.4 C) (08/18 1343) Pulse Rate:  [71-89] 82 (08/18 1343) Resp:  [20] 20 (08/18 0352) BP: (136-180)/(69-83) 150/70 (08/18 1343) SpO2:  [94 %-100 %] 95 % (08/18 1343)             INTAKE/OUTPUT:  This shift: No intake/output data recorded.  Last 2 shifts: @IOLAST2SHIFTS @   PHYSICAL EXAM:  Constitutional:  -- Normal body habitus  -- Awake, alert, and oriented x3  Eyes:  -- Pupils equally round and reactive to light  -- No scleral icterus  Ear, nose, and throat:  -- No jugular venous distension  Pulmonary:  -- No crackles  -- Equal breath sounds bilaterally -- Breathing non-labored at rest Cardiovascular:  -- S1, S2 present  -- No pericardial rubs Gastrointestinal:  -- Abdomen soft, nontender,  non-distended, no guarding or rebound tenderness -- No abdominal masses appreciated, pulsatile or otherwise  Musculoskeletal and Integumentary:  -- Wounds or skin discoloration: None appreciated -- Extremities: B/L UE and LE FROM, hands and feet warm, no edema  Neurologic:  -- Motor function: intact and symmetric -- Sensation: intact and symmetric   Labs:  CBC Latest Ref Rng & Units 01/04/2018 12/30/2017 2020-02-218  WBC 3.8 - 10.6 K/uL 5.5 5.2 8.9  Hemoglobin 13.0 - 18.0 g/dL 10.9(L) 10.1(L) 10.9(L)  Hematocrit 40.0 - 52.0 % 30.9(L) 28.0(L) 31.1(L)  Platelets 150 - 440 K/uL 178 112(L) 128(L)   CMP Latest Ref Rng & Units 01/04/2018 12/30/2017 2020-02-218  Glucose 70 - 99  mg/dL 128(H) 108(H) 126(H)  BUN 8 - 23 mg/dL 21 22 17   Creatinine 0.61 - 1.24 mg/dL 0.86 0.88 0.89  Sodium 135 - 145 mmol/L 140 136 136  Potassium 3.5 - 5.1 mmol/L 3.2(L) 3.6 3.8  Chloride 98 - 111 mmol/L 113(H) 108 106  CO2 22 - 32 mmol/L 20(L) 24 22  Calcium 8.9 - 10.3 mg/dL 8.5(L) 8.3(L) 8.5(L)  Total Protein 6.5 - 8.1 g/dL 6.3(L) 5.9(L) 6.3(L)  Total Bilirubin 0.3 - 1.2 mg/dL 1.7(H) 1.0 1.3(H)  Alkaline Phos 38 - 126 U/L 111 49 59  AST 15 - 41 U/L 68(H) 20 31  ALT 0 - 44 U/L 94(H) 18 20   Imaging studies:  EXAM: CT ABDOMEN AND PELVIS WITH CONTRAST  TECHNIQUE: Multidetector CT imaging of the abdomen and pelvis was performed using the standard protocol following bolus administration of intravenous contrast.  CONTRAST:  112mL ISOVUE-300 IOPAMIDOL (ISOVUE-300) INJECTION 61%  COMPARISON:  Prior study from 12/28/2017.  FINDINGS: Lower chest: Scattered atelectatic changes seen dependently within the lung bases bilaterally. Superimposed calcified granuloma noted at the posterior left lower lobe.  Hepatobiliary: Liver demonstrates a normal contrast enhanced appearance. Percutaneous cholecystostomy tube in place within the gallbladder. Distal aspects seen coiled appropriately within the gallbladder fundus.  Hyperdensity within the adjacent gallbladder lumen likely reflects blood. Adjacent gallbladder wall is thickened with associated mucosal enhancement. Small amount of free pericholecystic fluid. No adjacent fluid collections to suggest biloma or leak. No other hematoma along the catheter tract. Mild intrahepatic biliary dilatation.  Pancreas: Pancreas is atrophic without acute abnormality.  Spleen: Spleen within normal limits.  Adrenals/Urinary Tract: Adrenal glands are normal. Kidneys equal in size with symmetric enhancement. 2.1 cm left renal cyst. DISH in ule scattered subcentimeter hypodensities too small the characterize, but statistically likely reflects small cysts as well. No nephrolithiasis, hydronephrosis, or focal enhancing renal mass. No hydroureter. Bladder within normal limits.  Stomach/Bowel: Small hiatal hernia noted. Stomach otherwise unremarkable. No evidence for bowel obstruction. Colonic diverticulosis without evidence for acute diverticulitis. No acute inflammatory changes seen about the bowels.  Vascular/Lymphatic: Advanced aorto bi-iliac atherosclerotic disease. No aneurysm. Mesenteric vessels patent proximally. No adenopathy.  Reproductive: Prostate normal.  Other: Small fat containing left inguinal hernia. No free air or fluid.  Musculoskeletal: No acute osseus abnormality. No worrisome lytic or blastic osseous lesions. Moderate multilevel degenerative spondylolysis seen throughout the lumbar spine.  IMPRESSION: 1. Percutaneous cholecystostomy tube in place with tip appropriately position within the gallbladder lumen. Adjacent hyperdensity within the gallbladder lumen likely reflects associated hemorrhage. No evidence for leak or malpositioning. Gallbladder wall inflamed in appearance. 2. No other acute intra-abdominal or pelvic process. 3. Aortic atherosclerosis. 4. Colonic diverticulosis without evidence for acute  diverticulitis.   Electronically Signed   By: Jeannine Boga M.D.   On: 01/04/2018 07:00   Assessment/Plan: 84 y.o. male with history of acute cholecystitis, complicated by pertinent comorbidities including HTN. Patient with suspected injury from cholecystostomy tube that caused bleeding and partial obstruction of tube causing increasing liver enzymes. CT scan images were personally evaluated identifying the tube in the gallbladder. I personally flushed the catheter and removed clots. Will flush again until clear bile drains.  Will admit to observation for evaluation of liver enzymes trend. No need of procedure or surgical management at this moment.   Arnold Long, MD

## 2018-01-04 NOTE — Care Management Note (Signed)
Case Management Note  Patient Details  Name: Josede Cicero MRN: [id number] Date of Birth: 03/09/1924  Subjective/Objective:    Patient admitted to [facility] under observation status for elevated liver enzymes . RNCM consulted on patient to provide MOON letter and complete assessment. Patient recently discharged s/p biliary drain placement. Baseline patient lives at home alone. Son and daughter in law Osa Campoli [phone] stay with the patient frequently. Home health services RN, PT, and aide set up previous admission. Patient has RW from last admission. Family provides transportation. PCP is [place].                  Action/Plan: Will resume HH at discharge   Expected Discharge Date:                  Expected Discharge Plan:     In-House Referral:     Discharge planning Services     Post Acute Care Choice:    Choice offered to:     DME Arranged:    DME Agency:     HH Arranged:    HH Agency:     Status of Service:     If discussed at [employer], dates discussed:    Additional Comments:  Latanya Maudlin, RN 01/04/2018, 11:19 AM

## 2018-01-04 NOTE — ED Notes (Signed)
Surgeon at bedside.  

## 2018-01-04 NOTE — ED Notes (Signed)
Pt ambulatory to toilet with assistance.

## 2018-01-04 NOTE — Progress Notes (Signed)
NT stated the pt's stool is tan/gray in color. Will monitor.

## 2018-01-04 NOTE — ED Notes (Signed)
Pt provided cup of coffee. 

## 2018-01-05 ENCOUNTER — Ambulatory Visit: Payer: Medicare Other | Admitting: Internal Medicine

## 2018-01-05 ENCOUNTER — Telehealth: Payer: Self-pay | Admitting: Internal Medicine

## 2018-01-05 DIAGNOSIS — R748 Abnormal levels of other serum enzymes: Secondary | ICD-10-CM | POA: Diagnosis not present

## 2018-01-05 LAB — COMPREHENSIVE METABOLIC PANEL
ALBUMIN: 3.1 g/dL — AB (ref 3.5–5.0)
ALT: 82 U/L — AB (ref 0–44)
AST: 37 U/L (ref 15–41)
Alkaline Phosphatase: 106 U/L (ref 38–126)
Anion gap: 7 (ref 5–15)
BUN: 19 mg/dL (ref 8–23)
CHLORIDE: 113 mmol/L — AB (ref 98–111)
CO2: 21 mmol/L — AB (ref 22–32)
CREATININE: 0.67 mg/dL (ref 0.61–1.24)
Calcium: 8.3 mg/dL — ABNORMAL LOW (ref 8.9–10.3)
GFR calc Af Amer: >60 mL/min (ref >60)
Glucose, Bld: 110 mg/dL — ABNORMAL HIGH (ref 70–99)
Potassium: 3.1 mmol/L — ABNORMAL LOW (ref 3.5–5.1)
Sodium: 141 mmol/L (ref 135–145)
Total Bilirubin: 0.8 mg/dL (ref 0.3–1.2)
Total Protein: 5.9 g/dL — ABNORMAL LOW (ref 6.5–8.1)

## 2018-01-05 LAB — BILIRUBIN, DIRECT: Bilirubin, Direct: 0.2 mg/dL (ref 0.0–0.2)

## 2018-01-05 NOTE — Discharge Planning (Signed)
Patient IV removed.  RN assessment and VS revealed stability for DC to home.  Discharge papers given, explained and educated.  No pain reported at discharge.  Informed of suggested FU appts and appts made.  No scripts needed at this time.  Once ready, wheeled to front and family transporting home via car.

## 2018-01-05 NOTE — Care Management (Signed)
Jose [name] with [facility] notified of patient discharge to home today. Patient had already discharged to home; [facility] spoke with Daughter in law Jose [name] [phone] and patient is now at home. The only question she had was when home health would re-start care. RNCM updated her that they have been notified and will reach out to them to resume care.

## 2018-01-05 NOTE — Telephone Encounter (Signed)
Copied from [place] [id number]. Topic: Quick Communication - See Telephone Encounter >> Jan 05, 2018  5:04 PM Neva Seat wrote: Gerald Stabs, PT w/ [facility] - [phone]  Needing Verbal Orders:  1 time a week for 2 weeks 2 times a week for 1 week 1 time a week for 2 weeks

## 2018-01-05 NOTE — Telephone Encounter (Signed)
Copied from [place] [id number]. Topic: Quick Communication - See Telephone Encounter >> Jan 05, 2018  8:09 AM Gardiner Ramus wrote: CRM for notification. See Telephone encounter for: 01/05/18. Pt daughter called and stated that father in-law is back in the hospital. Pt daughter in law wanted to talk to dr Silvio Pate about getting gallbladder surgery while he is in the hospital. Please advise

## 2018-01-05 NOTE — Discharge Summary (Addendum)
Physician Discharge Summary  Patient ID: Jose [name] MRN: [id number] DOB/AGE: [birth year] 84 y.o.  Admit date: 01/04/2018 Discharge date: 01/05/2018  Admission Diagnoses: elevated liver enzymes  Discharge Diagnoses:  Same as above  Discharged Condition: good  Hospital Course: Pt admitted for bleeding and elevated liver enzymes, likely from cholecystostomy site.  Overnight observation and repeat labs showed normalizing labs with no further clinical suspicion of bleeding or possible bile leak.  CT confirmed drain still in place.  At time of d/c, patient is comfortable, voiding and eating without difficulty so ok to be d/c'd.  Pt family member declined prescription and states they will provide K supplementation as needed.  I recommended one dose of 21mEQ for now and to followup with primary to monitor levels. They verbalized understanding  Consults: None  Discharge Exam: Blood pressure 125/74, pulse 78, temperature (!) 97.4 F (36.3 C), temperature source Oral, resp. rate 18, SpO2 97 %. General appearance: alert and no distress GI: soft, non-tender; bowel sounds normal; no masses,  no organomegaly and cholecystostomy tube in place, minimal tenderness around insertion site as expected.  no swelling, induration to indicate any infection  Disposition:  Discharge disposition: 01-Home or Self Care       Discharge Instructions    Discharge patient   Complete by:  As directed    Discharge disposition:  01-Home or Self Care   Discharge patient date:  01/05/2018     Allergies as of 01/05/2018   No Known Allergies     Medication List    TAKE these medications   acetaminophen 500 MG tablet Commonly known as:  TYLENOL Take 1,000 mg by mouth every 6 (six) hours as needed.   aspirin 81 MG tablet Take 81 mg by mouth daily.   ferrous sulfate 325 (65 FE) MG EC tablet Take 325 mg by mouth daily with breakfast.   gabapentin 600 MG tablet Commonly known as:  NEURONTIN TAKE 1  TABLET (600 MG TOTAL) BY MOUTH 2 (TWO) TIMES DAILY. What changed:  See the new instructions.   ibuprofen 200 MG tablet Commonly known as:  ADVIL,MOTRIN Take 200-400 mg by mouth 3 (three) times daily as needed (Knee pain).   ketoconazole 2 % cream Commonly known as:  NIZORAL Apply 1 application topically 2 (two) times daily as needed for irritation.   sodium chloride flush 0.9 % Soln Commonly known as:  NS 10 mLs by Intracatheter route every 12 (twelve) hours.   tamsulosin 0.4 MG Caps capsule Commonly known as:  FLOMAX TAKE 1 CAPSULE (0.4 MG TOTAL) BY MOUTH DAILY. What changed:  See the new instructions.   triamcinolone cream 0.1 % Commonly known as:  KENALOG Apply 1 application topically 2 (two) times daily as needed.   vitamin B-12 500 MCG tablet Commonly known as:  CYANOCOBALAMIN Take 500 mcg by mouth daily.      Follow-up Information    [name], MD Follow up.   Specialties:  Internal Medicine, Pediatrics Contact information: [place] [phone]            Total time spent arranging discharge was >68min. Signed: Benjamine Sprague 01/05/2018, 11:56 AM

## 2018-01-05 NOTE — Discharge Instructions (Signed)
Biliary Colic, Adult Biliary colic is severe pain caused by a problem with a small organ in the upper right part of your belly (gallbladder). The gallbladder stores a digestive fluid produced in the liver (bile) that helps the body break down fat. Bile and other digestive enzymes are carried from the liver to the small intestine though tube-like structures (bile ducts). The gallbladder and the bile ducts form the biliary tract. Sometimes hard deposits of digestive fluids form in the gallbladder (gallstones) and block the flow of bile from the gallbladder, causing biliary colic. This condition is also called a gallbladder attack. Gallstones can be as small as a grain of sand or as big as a golf ball. There could be just one gallstone in the gallbladder, or there could be many. What are the causes? Biliary colic is usually caused by gallstones. Less often, a tumor could block the flow of bile from the gallbladder and trigger biliary colic. What increases the risk? This condition is more likely to develop in:  Women.  People of Hispanic descent.  People with a family history of gallstones.  People who are obese.  People who suddenly or quickly lose weight.  People who eat a high-calorie, low-fiber diet that is rich in refined carbs (carbohydrates), such as white bread and white rice.  People who have an intestinal disease that affects nutrient absorption, such as Crohn disease.  People who have a metabolic condition, such as metabolic syndrome or diabetes.  What are the signs or symptoms? Severe pain in the upper right side of the belly is the main symptom of biliary colic. You may feel this pain below the chest but above the hip. This pain often occurs at night or after eating a very fatty meal. This pain may get worse for up to an hour and last as long as 12 hours. In most cases, the pain fades (subsides) within a couple hours. Other symptoms of this condition include:  Nausea and  vomiting.  Pain under the right shoulder.  How is this diagnosed? This condition is diagnosed based on your medical history, your symptoms, and a physical exam. You may have tests, including:  Blood tests to rule out infection or inflammation of the bile ducts, gallbladder, pancreas, or liver.  Imaging studies such as: ? Ultrasound. ? CT scan. ? MRI.  In some cases, you may need to have an imaging study done using a small amount of radioactive material (nuclear medicine) to confirm the diagnosis. How is this treated? Treatment for this condition may include medicine to relieve your pain or nausea. If you have gallstones that are causing biliary colic, you may need surgery to remove the gallbladder (cholecystectomy). Gallstones can also be dissolved gradually with medicine. It may take months or years before the gallstones are completely gone. Follow these instructions at home:  Take over-the-counter and prescription medicines only as told by your health care provider.  Drink enough fluid to keep your urine clear or pale yellow.  Follow instructions from your health care provider about eating or drinking restrictions. These may include avoiding: ? Fatty, greasy, and fried foods. ? Any foods that make the pain worse. ? Overeating. ? Having a large meal after not eating for a while.  Keep all follow-up visits as told by your health care provider. This is important. How is this prevented? Steps to prevent this condition include:  Maintaining a healthy body weight.  Getting regular exercise.  Eating a healthy, high-fiber, low-fat diet.  Limiting how much sugar and refined carbs you eat, such as sweets, white flour, and white rice.  Contact a health care provider if:  Your pain lasts more than 5 hours.  You vomit.  You have a fever and chills.  Your pain gets worse. Get help right away if:  Your skin or the whites of your eyes look yellow (jaundice).  Your have  tea-colored urine and light-colored stools.  You are dizzy or you faint. This information is not intended to replace advice given to you by your health care provider. Make sure you discuss any questions you have with your health care provider. Document Released: 10/07/2005 Document Revised: 01/02/2016 Document Reviewed: 11/20/2015 Elsevier Interactive Patient Education  2018 [employer]. Please mention low potassium levels at your next followup appt with your pcp.  Only take one dose of 40 mEq upon discharge for now

## 2018-01-06 ENCOUNTER — Encounter: Payer: Self-pay | Admitting: Internal Medicine

## 2018-01-06 ENCOUNTER — Ambulatory Visit (INDEPENDENT_AMBULATORY_CARE_PROVIDER_SITE_OTHER): Payer: Medicare Other | Admitting: Internal Medicine

## 2018-01-06 ENCOUNTER — Other Ambulatory Visit: Payer: Self-pay | Admitting: Surgery

## 2018-01-06 ENCOUNTER — Telehealth: Payer: Self-pay | Admitting: Internal Medicine

## 2018-01-06 VITALS — BP 138/78 | HR 85 | Temp 97.6°F | Ht 66.5 in | Wt 135.0 lb

## 2018-01-06 DIAGNOSIS — K8000 Calculus of gallbladder with acute cholecystitis without obstruction: Secondary | ICD-10-CM | POA: Diagnosis not present

## 2018-01-06 DIAGNOSIS — E441 Mild protein-calorie malnutrition: Secondary | ICD-10-CM | POA: Insufficient documentation

## 2018-01-06 MED ORDER — POTASSIUM CHLORIDE CRYS ER 20 MEQ PO TBCR: 40.0000 meq | EXTENDED_RELEASE_TABLET | Freq: Every day | ORAL | 11 refills | Status: DC

## 2018-01-06 NOTE — Assessment & Plan Note (Signed)
I would challenge the decision to wait an extended time before proceeding with needed cholecystectomy. He is frustrated, in discomfort which is affecting his appetite and we can expect increasing nutritional deficits as well as other technical problems with the tube No cardiorespiratory problems now I would recommend proceeding with surgery ASAP Will make referral to [facility] to consider this

## 2018-01-06 NOTE — Telephone Encounter (Signed)
Okay to refill whatever he needs

## 2018-01-06 NOTE — Telephone Encounter (Signed)
okay

## 2018-01-06 NOTE — Telephone Encounter (Signed)
Left verbal orders on VM for [facility].

## 2018-01-06 NOTE — Assessment & Plan Note (Signed)
Recent 10-13# weight loss Need to proceed with definitive repair of the gallbladder

## 2018-01-06 NOTE — Telephone Encounter (Signed)
Copied from [place] [id number]. Topic: General - Other >> Jan 06, 2018 12:58 PM Alfredia Ferguson R wrote: Pt was just seen in the office this morning and forgot to mention he needed potassium. Please call pts daughter in law [place] back in for the prescription or directions needed  Cb# [phone]

## 2018-01-06 NOTE — Telephone Encounter (Signed)
Spoke to [place]. He is supposed to be on 76meq of potassium. I sent in the rx. They did not need anything else.

## 2018-01-06 NOTE — Progress Notes (Signed)
Subjective:    Patient ID: Jose [name], male    DOB: 1923/09/20, 84 y.o.   MRN: [id number]  HPI Here for follow up of ongoing chronic cholecystitis Was hospitalized and very ill--- got cholecystotomy and sent home Back in briefly when tube blocked with blood---able to be irrigated and went home again  Not eating great If family are there with prepared food--does okay Not much otherwise-- has lost 10# or more No N/V Still has some pain from gas  Bowels are okay--but lots of gas No fever that he knows of  No chest pain----just feels sore in RUQ  Has pleuritic pain with deep breath-- RUQ No separate SOB No cough  Current Outpatient Medications on File Prior to Visit  Medication Sig Dispense Refill  . acetaminophen (TYLENOL) 500 MG tablet Take 1,000 mg by mouth every 6 (six) hours as needed.    Marland Kitchen aspirin 81 MG tablet Take 81 mg by mouth daily.      . ferrous sulfate 325 (65 FE) MG EC tablet Take 325 mg by mouth daily with breakfast.    . gabapentin (NEURONTIN) 600 MG tablet TAKE 1 TABLET (600 MG TOTAL) BY MOUTH 2 (TWO) TIMES DAILY. (Patient taking differently: Take 600 mg by mouth 2 (two) times daily. ) 180 tablet 3  . ibuprofen (ADVIL,MOTRIN) 200 MG tablet Take 200-400 mg by mouth 3 (three) times daily as needed (Knee pain).     Marland Kitchen ketoconazole (NIZORAL) 2 % cream Apply 1 application topically 2 (two) times daily as needed for irritation. 60 g 1  . sodium chloride flush (NS) 0.9 % SOLN 10 mLs by Intracatheter route every 12 (twelve) hours. 30 Syringe 0  . tamsulosin (FLOMAX) 0.4 MG CAPS capsule TAKE 1 CAPSULE (0.4 MG TOTAL) BY MOUTH DAILY. (Patient taking differently: Take 0.4 mg by mouth daily. ) 90 capsule 3  . triamcinolone cream (KENALOG) 0.1 % Apply 1 application topically 2 (two) times daily as needed. 30 g 1  . vitamin B-12 (CYANOCOBALAMIN) 500 MCG tablet Take 500 mcg by mouth daily.     No current facility-administered medications on file prior to visit.     No  Known Allergies  Past Medical History:  Diagnosis Date  . Back problem 1960  . BPH (benign prostatic hypertrophy)   . Cervical radiculopathy    with neuropathy  . GERD (gastroesophageal reflux disease)   . Hypertension   . Melanoma in situ ([place]) 12/2016   neck    Past Surgical History:  Procedure Laterality Date  . CATARACT EXTRACTION     OD  . IR PERC CHOLECYSTOSTOMY  12/28/2017  . MELANOMA EXCISION  12/2016   in situ---Dr Dasher  . TONSILLECTOMY      Family History  Problem Relation Age of Onset  . Cancer Son   . Coronary artery disease Neg Hx   . Diabetes Neg Hx     Social History   Socioeconomic History  . Marital status: Widowed    Spouse name: Not on file  . Number of children: 2  . Years of education: Not on file  . Highest education level: Not on file  Occupational History  . Occupation: retiredOccupational psychologist [employer]    Employer: RETIRED  Social Needs  . Financial resource strain: Not on file  . Food insecurity:    Worry: Not on file    Inability: Not on file  . Transportation needs:    Medical: Not on file    Non-medical: Not on  file  Tobacco Use  . Smoking status: Never Smoker  . Smokeless tobacco: Never Used  Substance and Sexual Activity  . Alcohol use: No  . Drug use: No  . Sexual activity: Not on file  Lifestyle  . Physical activity:    Days per week: Not on file    Minutes per session: Not on file  . Stress: Not on file  Relationships  . Social connections:    Talks on phone: Patient refused    Gets together: Patient refused    Attends religious service: Patient refused    Active member of club or organization: Patient refused    Attends meetings of clubs or organizations: Patient refused    Relationship status: Patient refused  . Intimate partner violence:    Fear of current or ex partner: Patient refused    Emotionally abused: Patient refused    Physically abused: Patient refused    Forced sexual activity: Patient refused  Other  Topics Concern  . Not on file  Social History Narrative   Has living will   Son Mikeal Hawthorne is health care POA.     Requests DNR--done 05/07/16   No feeding tube if cognitively unaware   Review of Systems Family emptying drainage bag The hanging bag bothers him a lot Sleep is fair--usually 5 hours per day. Up for nocturia or due to soreness in abdomen    Objective:   Physical Exam  Constitutional: No distress.  Neck: No thyromegaly present.  Cardiovascular: Normal rate and regular rhythm. Exam reveals no gallop.  Soft systolic murmur towards apex  Respiratory: Effort normal and breath sounds normal. No respiratory distress. He has no wheezes. He has no rales.  GI:  No sig tenderness in RUQ Murphy's sign absent Tube in place and draining  Musculoskeletal: He exhibits no edema.  Lymphadenopathy:    He has no cervical adenopathy.  Psychiatric: He has a normal mood and affect. His behavior is normal.           Assessment & Plan:

## 2018-01-08 ENCOUNTER — Telehealth: Payer: Self-pay | Admitting: Internal Medicine

## 2018-01-08 NOTE — Telephone Encounter (Signed)
Okay to proceed.  

## 2018-01-08 NOTE — Telephone Encounter (Signed)
Copied from [place] [id number]. Topic: General - Other >> Jan 08, 2018  2:41 PM Yvette Rack wrote: Reason for CRM: Varney Biles with [facility] requests verbal orders for Social Work Consultation to see if pt qualifies for Medicaid. Cb# [phone]

## 2018-01-09 NOTE — Telephone Encounter (Signed)
Verbal orders left on VM 

## 2018-01-12 ENCOUNTER — Other Ambulatory Visit: Payer: Self-pay | Admitting: Internal Medicine

## 2018-01-12 ENCOUNTER — Telehealth: Payer: Self-pay | Admitting: Internal Medicine

## 2018-01-12 NOTE — Telephone Encounter (Signed)
I spoke to [place] and she scheduled appointment for tomorrow at noon.

## 2018-01-12 NOTE — Telephone Encounter (Signed)
Yes--but that is definitely it for tomorrow

## 2018-01-12 NOTE — Telephone Encounter (Signed)
Patient's daughter,Kathy,called and spoke to [facility].  Patient has been having stomach pain, but it has gotten worse over the weekend.  She only wants patient to see Dr.Letvak.  She said patient should be ok until patient can see Dr.Letvak.  Can patient be put in same day appointment on 01/13/18 at 11:15?

## 2018-01-12 NOTE — Telephone Encounter (Signed)
yes

## 2018-01-12 NOTE — Telephone Encounter (Signed)
Someone took the 11:15 before I could get it in.  Would you see him at 12:00?

## 2018-01-13 ENCOUNTER — Ambulatory Visit (INDEPENDENT_AMBULATORY_CARE_PROVIDER_SITE_OTHER): Payer: Medicare Other | Admitting: Internal Medicine

## 2018-01-13 ENCOUNTER — Encounter: Payer: Self-pay | Admitting: Internal Medicine

## 2018-01-13 VITALS — BP 124/80 | HR 81 | Temp 97.5°F | Ht 66.5 in | Wt 133.0 lb

## 2018-01-13 DIAGNOSIS — M5412 Radiculopathy, cervical region: Secondary | ICD-10-CM

## 2018-01-13 DIAGNOSIS — I10 Essential (primary) hypertension: Secondary | ICD-10-CM | POA: Diagnosis not present

## 2018-01-13 DIAGNOSIS — Z438 Encounter for attention to other artificial openings: Secondary | ICD-10-CM | POA: Diagnosis not present

## 2018-01-13 DIAGNOSIS — K219 Gastro-esophageal reflux disease without esophagitis: Secondary | ICD-10-CM

## 2018-01-13 DIAGNOSIS — K811 Chronic cholecystitis: Secondary | ICD-10-CM

## 2018-01-13 DIAGNOSIS — K8063 Calculus of gallbladder and bile duct with acute cholecystitis with obstruction: Secondary | ICD-10-CM | POA: Diagnosis not present

## 2018-01-13 DIAGNOSIS — N400 Enlarged prostate without lower urinary tract symptoms: Secondary | ICD-10-CM

## 2018-01-13 NOTE — Progress Notes (Signed)
Subjective:    Patient ID: Jose [name], male    DOB: 1923-10-30, 84 y.o.   MRN: [id number]  HPI Here with DIL Having a lot of problems "with my stomach" Bowels are water and gas  No appetite Eats 2 meals a day---breakfast then light dinner Not taking boost or ensure--but they are giving him milk shakes and ice cream Weight down 2#  Finishing the course of antibiotics from Dr Ninfa Linden  Current Outpatient Medications on File Prior to Visit  Medication Sig Dispense Refill  . acetaminophen (TYLENOL) 500 MG tablet Take 1,000 mg by mouth every 6 (six) hours as needed.    Marland Kitchen aspirin 81 MG tablet Take 81 mg by mouth daily.      Marland Kitchen gabapentin (NEURONTIN) 600 MG tablet TAKE 1 TABLET (600 MG TOTAL) BY MOUTH 2 (TWO) TIMES DAILY. (Patient taking differently: Take 600 mg by mouth 2 (two) times daily. ) 180 tablet 3  . ibuprofen (ADVIL,MOTRIN) 200 MG tablet Take 200-400 mg by mouth 3 (three) times daily as needed (Knee pain).     Marland Kitchen ketoconazole (NIZORAL) 2 % cream Apply 1 application topically 2 (two) times daily as needed for irritation. 60 g 1  . potassium chloride SA (K-DUR,KLOR-CON) 20 MEQ tablet Take 2 tablets (40 mEq total) by mouth daily. 60 tablet 11  . sodium chloride flush (NS) 0.9 % SOLN 10 mLs by Intracatheter route every 12 (twelve) hours. 30 Syringe 0  . tamsulosin (FLOMAX) 0.4 MG CAPS capsule TAKE 1 CAPSULE (0.4 MG TOTAL) BY MOUTH DAILY. (Patient taking differently: Take 0.4 mg by mouth daily. ) 90 capsule 3  . triamcinolone cream (KENALOG) 0.1 % Apply 1 application topically 2 (two) times daily as needed. 30 g 1  . vitamin B-12 (CYANOCOBALAMIN) 500 MCG tablet Take 500 mcg by mouth daily.    . ferrous sulfate 325 (65 FE) MG EC tablet Take 325 mg by mouth daily with breakfast.     No current facility-administered medications on file prior to visit.     No Known Allergies  Past Medical History:  Diagnosis Date  . Back problem 1960  . BPH (benign prostatic hypertrophy)   .  Cervical radiculopathy    with neuropathy  . GERD (gastroesophageal reflux disease)   . Hypertension   . Melanoma in situ ([place]) 12/2016   neck    Past Surgical History:  Procedure Laterality Date  . CATARACT EXTRACTION     OD  . IR PERC CHOLECYSTOSTOMY  12/28/2017  . MELANOMA EXCISION  12/2016   in situ---Dr Dasher  . TONSILLECTOMY      Family History  Problem Relation Age of Onset  . Cancer Son   . Coronary artery disease Neg Hx   . Diabetes Neg Hx     Social History   Socioeconomic History  . Marital status: Widowed    Spouse name: Not on file  . Number of children: 2  . Years of education: Not on file  . Highest education level: Not on file  Occupational History  . Occupation: retiredOccupational psychologist [employer]    Employer: RETIRED  Social Needs  . Financial resource strain: Not on file  . Food insecurity:    Worry: Not on file    Inability: Not on file  . Transportation needs:    Medical: Not on file    Non-medical: Not on file  Tobacco Use  . Smoking status: Never Smoker  . Smokeless tobacco: Never Used  Substance and Sexual Activity  .  Alcohol use: No  . Drug use: No  . Sexual activity: Not on file  Lifestyle  . Physical activity:    Days per week: Not on file    Minutes per session: Not on file  . Stress: Not on file  Relationships  . Social connections:    Talks on phone: Patient refused    Gets together: Patient refused    Attends religious service: Patient refused    Active member of club or organization: Patient refused    Attends meetings of clubs or organizations: Patient refused    Relationship status: Patient refused  . Intimate partner violence:    Fear of current or ex partner: Patient refused    Emotionally abused: Patient refused    Physically abused: Patient refused    Forced sexual activity: Patient refused  Other Topics Concern  . Not on file  Social History Narrative   Has living will   Son Mikeal Hawthorne is health care POA.     Requests  DNR--done 05/07/16   No feeding tube if cognitively unaware   Review of Systems Feet are "burning up"----put them in cold water  No cough or SOB---does get heavy breathing walking around the house though No fever No N/V    Objective:   Physical Exam  Constitutional: No distress.  Neck: No thyromegaly present.  Respiratory: Effort normal and breath sounds normal. No respiratory distress. He has no wheezes. He has no rales.  GI: Soft. Bowel sounds are normal. He exhibits no distension. There is no rebound and no guarding.  Mild RUQ tenderness  Lymphadenopathy:    He has no cervical adenopathy.           Assessment & Plan:

## 2018-01-13 NOTE — Assessment & Plan Note (Signed)
Seemed worse this weekend--but has rallied Not worse than before---seems to be stable with mild persistent symptoms Drain 250cc per day on average--considerably less than before No fever  Discussed adding boost/ensure Should be okay to proceed with planned surgery on 9/9 at this point

## 2018-01-20 ENCOUNTER — Telehealth: Payer: Self-pay | Admitting: Internal Medicine

## 2018-01-20 ENCOUNTER — Other Ambulatory Visit: Payer: Self-pay

## 2018-01-20 MED ORDER — SODIUM CHLORIDE 0.9% FLUSH: 10.0000 mL | Freq: Two times a day (BID) | INTRAVENOUS | 1 refills | Status: DC

## 2018-01-20 NOTE — Telephone Encounter (Signed)
Copied from [place] [id number]. Topic: Quick Communication - Rx Refill/Question >> Jan 20, 2018 10:51 AM Mcneil, Ja-Kwan wrote: Medication: sodium chloride flush (NS) 0.9 % SOLN  Has the patient contacted their pharmacy? No  Preferred Pharmacy (with phone number or street name): [place], [place]. [phone] (Phone) [phone] (Fax)  Agent: Please be advised that RX refills may take up to 3 business days. We ask that you follow-up with your pharmacy.

## 2018-01-20 NOTE — Telephone Encounter (Signed)
[facility] called; pt last seen 01/13/18; when pt was in hospital was given NaCl flush 0.9% by discharging physician on 12/30/17. Pt needs refilled before 01/21/18.Please advise.

## 2018-01-20 NOTE — Pre-Procedure Instructions (Signed)
Jose [name]  01/20/2018      [id number] [address], [place] [address] Phone: [phone] Fax: [place], [place]. [place] [address] Phone: [phone] Fax: [phone]    Your procedure is scheduled on September 9th.  Report to [facility] Admitting at 0700 A.M.  Call this number if you have problems the morning of surgery:  [phone]   Remember:  Do not eat or drink after midnight.     Take these medicines the morning of surgery with A SIP OF WATER   Tylenol (if needed)  Iron  Gabapentin  Flomax   7 days prior to surgery STOP taking any Aspirin(unless otherwise instructed by your surgeon), Aleve, Naproxen, Ibuprofen, Motrin, Advil, Goody's, BC's, all herbal medications, fish oil, and all vitamins     Do not wear jewelry  Do not wear lotions, powders, or colognes, or deodorant.  Do not shave 48 hours prior to surgery.  Men may shave face and neck.  Do not bring valuables to the hospital.  [facility] is not responsible for any belongings or valuables.  Contacts, dentures or bridgework may not be worn into surgery.  Leave your suitcase in the car.  After surgery it may be brought to your room.  For patients admitted to the hospital, discharge time will be determined by your treatment team.  Patients discharged the day of surgery will not be allowed to drive home.    [place]- Preparing For Surgery  Before surgery, you can play an important role. Because skin is not sterile, your skin needs to be as free of germs as possible. You can reduce the number of germs on your skin by washing with CHG (chlorahexidine gluconate) Soap before surgery.  CHG is an antiseptic cleaner which kills germs and bonds with the skin to continue killing germs even after washing.    Oral Hygiene is also important to reduce your risk of infection.  Remember  - BRUSH YOUR TEETH THE MORNING OF SURGERY WITH YOUR REGULAR TOOTHPASTE  Please do not use if you have an allergy to CHG or antibacterial soaps. If your skin becomes reddened/irritated stop using the CHG.  Do not shave (including legs and underarms) for at least 48 hours prior to first CHG shower. It is OK to shave your face.  Please follow these instructions carefully.   1. Shower the NIGHT BEFORE SURGERY and the MORNING OF SURGERY with CHG.   2. If you chose to wash your hair, wash your hair first as usual with your normal shampoo.  3. After you shampoo, rinse your hair and body thoroughly to remove the shampoo.  4. Use CHG as you would any other liquid soap. You can apply CHG directly to the skin and wash gently with a scrungie or a clean washcloth.   5. Apply the CHG Soap to your body ONLY FROM THE NECK DOWN.  Do not use on open wounds or open sores. Avoid contact with your eyes, ears, mouth and genitals (private parts). Wash Face and genitals (private parts)  with your normal soap.  6. Wash thoroughly, paying special attention to the area where your surgery will be performed.  7. Thoroughly rinse your body with warm water from the neck down.  8. DO NOT shower/wash with your normal soap after using and rinsing off the CHG Soap.  9. Pat yourself dry with a CLEAN TOWEL.  10. Wear CLEAN PAJAMAS to bed the night before surgery, wear comfortable clothes the morning of surgery  11. Place CLEAN SHEETS on your bed the night of your first shower and DO NOT SLEEP WITH PETS.    Day of Surgery:  Do not apply any deodorants/lotions.  Please wear clean clothes to the hospital/surgery center.   Remember to brush your teeth WITH YOUR REGULAR TOOTHPASTE.    Please read over the following fact sheets that you were given.

## 2018-01-20 NOTE — Telephone Encounter (Signed)
NS flush refill Last Refill:12/30/17 # 30 Last OV: 01/06/18 PCP: Dr Silvio Pate Pharmacy:[facility]

## 2018-01-20 NOTE — Telephone Encounter (Signed)
Approved: give enough for 2 weeks x 1 refill Due to have his gallbladder taken out soon

## 2018-01-20 NOTE — Telephone Encounter (Signed)
Rx faxed to [facility] 

## 2018-01-21 ENCOUNTER — Encounter (HOSPITAL_COMMUNITY)
Admission: RE | Admit: 2018-01-21 | Discharge: 2018-01-21 | Disposition: A | Discharge status: Home / Self Care | Payer: Medicare Other | Source: Ambulatory Visit | Attending: Surgery | Admitting: Surgery

## 2018-01-21 ENCOUNTER — Encounter (HOSPITAL_COMMUNITY): Payer: Self-pay

## 2018-01-21 DIAGNOSIS — Z01812 Encounter for preprocedural laboratory examination: Secondary | ICD-10-CM | POA: Insufficient documentation

## 2018-01-21 HISTORY — DX: Anxiety disorder, unspecified: F41.9

## 2018-01-21 HISTORY — DX: Hypokalemia: E87.6

## 2018-01-21 HISTORY — DX: Dyspnea, unspecified: R06.00

## 2018-01-21 LAB — CBC
HEMATOCRIT: 36.5 % — AB (ref 39.0–52.0)
Hemoglobin: 12 g/dL — ABNORMAL LOW (ref 13.0–17.0)
MCH: 34.5 pg — AB (ref 26.0–34.0)
MCHC: 32.9 g/dL (ref 30.0–36.0)
MCV: 104.9 fL — AB (ref 78.0–100.0)
Platelets: 149 10*3/uL — ABNORMAL LOW (ref 150–400)
RBC: 3.48 MIL/uL — ABNORMAL LOW (ref 4.22–5.81)
RDW: 17.5 % — ABNORMAL HIGH (ref 11.5–15.5)
WBC: 5.3 10*3/uL (ref 4.0–10.5)

## 2018-01-21 LAB — BASIC METABOLIC PANEL
Anion gap: 10 (ref 5–15)
BUN: 27 mg/dL — AB (ref 8–23)
CHLORIDE: 108 mmol/L (ref 98–111)
CO2: 22 mmol/L (ref 22–32)
CREATININE: 0.94 mg/dL (ref 0.61–1.24)
Calcium: 9.4 mg/dL (ref 8.9–10.3)
GFR calc Af Amer: >60 mL/min (ref >60)
GFR calc non Af Amer: >60 mL/min (ref >60)
GLUCOSE: 131 mg/dL — AB (ref 70–99)
POTASSIUM: 4.8 mmol/L (ref 3.5–5.1)
Sodium: 140 mmol/L (ref 135–145)

## 2018-01-21 MED ORDER — CHLORHEXIDINE GLUCONATE CLOTH 2 % EX PADS: 6.0000 | MEDICATED_PAD | Freq: Once | CUTANEOUS | Status: DC

## 2018-01-25 NOTE — Anesthesia Preprocedure Evaluation (Addendum)
Anesthesia Evaluation  Patient identified by MRN, date of birth, ID band Patient awake    Reviewed: Allergy & Precautions, NPO status , Patient's Chart, lab work & pertinent test results  Airway Mallampati: II  TM Distance: >3 FB Neck ROM: Full    Dental no notable dental hx. (+) Edentulous Upper, Poor Dentition,    Pulmonary neg pulmonary ROS,    Pulmonary exam normal breath sounds clear to auscultation       Cardiovascular Exercise Tolerance: Good hypertension, Normal cardiovascular exam Rhythm:Regular Rate:Normal     Neuro/Psych negative neurological ROS  negative psych ROS   GI/Hepatic GERD  ,  Endo/Other    Renal/GU negative Renal ROS     Musculoskeletal  (+) Arthritis ,   Abdominal   Peds  Hematology  (+) anemia ,   Anesthesia Other Findings   Reproductive/Obstetrics                            Anesthesia Physical Anesthesia Plan  ASA: III  Anesthesia Plan: General   Post-op Pain Management:    Induction:   PONV Risk Score and Plan: 2 and Treatment may vary due to age or medical condition and Ondansetron  Airway Management Planned: Oral ETT  Additional Equipment:   Intra-op Plan:   Post-operative Plan: Extubation in OR  Informed Consent: I have reviewed the patients History and Physical, chart, labs and discussed the procedure including the risks, benefits and alternatives for the proposed anesthesia with the patient or authorized representative who has indicated his/her understanding and acceptance.   Dental advisory given  Plan Discussed with: CRNA  Anesthesia Plan Comments:        Anesthesia Quick Evaluation

## 2018-01-25 NOTE — H&P (Signed)
Tera Helper Documented: 01/06/2018 3:27 PM Location: [facility] Patient #: [id number] DOB: [birth year] Widowed / Language: Cleophus Molt / Race: White Male   History of Present Illness Nathaneil Canary A. Ninfa Linden MD; 01/06/2018 3:59 PM) The patient is a 84 year old male who presents for evaluation of gall stones. This patient is sent over by Dr. Viviana Simpler for cholecystitis. He is a 84 year old gentleman who was seen at [facility] on August 11. He was found to have cholecystitis with a stone in the gallbladder neck. He ended up getting a percutaneous cholecystostomy today after admission for confusion and fever. He was then discharged 2 days later with a drain in place. He is otherwise fairly healthy for his age except for hard of hearing. He does walk with a walker. He was sent here by his primary care physician for planning for definitive surgery for his gallbladder. Currently he has no abdominal pain and no fever.   PAST MEDICAL HISTORY (PMH):      Past Medical History:  Diagnosis Date  . Back problem 1960  . BPH (benign prostatic hypertrophy)   . Cervical radiculopathy    with neuropathy  . GERD (gastroesophageal reflux disease)   . Hypertension   . Melanoma in situ ([facility]) 12/2016   neck     PAST SURGICAL HISTORY [facility]):       Past Surgical History:  Procedure Laterality Date  . CATARACT EXTRACTION     OD  . IR PERC CHOLECYSTOSTOMY  12/28/2017  . MELANOMA EXCISION  12/2016   in situ---Dr Dasher  . TONSILLECTOMY      Allergies Malachi Bonds, CMA; 01/06/2018 3:33 PM) No Known Drug Allergies [01/06/2018]:  Medication History Malachi Bonds, CMA; 01/06/2018 3:35 PM) Gabapentin (600MG  Tablet, Oral) Active. Tamsulosin HCl (0.4MG  Capsule, Oral) Active. Aspirin (81MG  Tablet, Oral) Active. Ferrous Sulfate (325 (65 Fe)MG Tablet, Oral) Active. Potassium Chloride (20MEQ Tablet ER, Oral) Active. Cyanocobalamin (100MCG/ML Solution,  Injection) Active. Medications Reconciled  Vitals (Chemira Jones CMA; 01/06/2018 3:33 PM) 01/06/2018 3:32 PM Weight: 136.6 lb Height: 66.5in Body Surface Area: 1.71 m Body Mass Index: 21.72 kg/m  Temp.: 98.58F(Oral)  Pulse: 108 (Regular)        Physical Exam (Dominic Rhome A. Ninfa Linden MD; 01/06/2018 3:59 PM) General Mental Status-Alert. General Appearance-Consistent with stated age. Hydration-Well hydrated. Voice-Normal.  Head and Neck Head-normocephalic, atraumatic with no lesions or palpable masses.  Eye Eyeball - Bilateral-Extraocular movements intact. Sclera/Conjunctiva - Bilateral-No scleral icterus.  Chest and Lung Exam Chest and lung exam reveals -quiet, even and easy respiratory effort with no use of accessory muscles and on auscultation, normal breath sounds, no adventitious sounds and normal vocal resonance. Inspection Chest Wall - Normal. Back - normal.  Cardiovascular Cardiovascular examination reveals -on palpation PMI is normal in location and amplitude, no palpable S3 or S4. Normal cardiac borders., normal heart sounds, regular rate and rhythm with no murmurs, carotid auscultation reveals no bruits and normal pedal pulses bilaterally.  Abdomen Inspection Inspection of the abdomen reveals - No Hernias. Skin - Scar - no surgical scars. Palpation/Percussion Palpation and Percussion of the abdomen reveal - Soft, Non Tender, No Rebound tenderness, No Rigidity (guarding) and No hepatosplenomegaly. Auscultation Auscultation of the abdomen reveals - Bowel sounds normal. Note: There is a drain in the right upper quadrant draining bile   Neurologic - Did not examine.  Musculoskeletal - Did not examine.    Assessment & Plan (Himmat Enberg A. Ninfa Linden MD; 01/06/2018 4:01 PM) CHRONIC CHOLECYSTITIS WITH CALCULUS (K80.10) Impression: Patient  status post percutaneous cholecystostomy for cholecystitis for gallbladder impacted in the gallbladder  neck. I had a long discussion with the patient's family regarding gallbladder disease and gallstones. We discussed definitive surgery for this situation. His last CAT scan as we can show he was still inflamed. I would recommend that we wait for 3 more weeks prior to proceeding with a cholecystectomy because of the high risk of conversion to an open procedure and bile duct injury if used undergo emergent surgery currently. Again right now he is pain-free and afebrile. I encouraged the family to push Ensure shakes on him and maximize his diet preoperatively. I discussed the surgical procedure with him in detail. I discussed option of cholecystectomy. I discussed the risk which includes but is not limited to bleeding, infection, the need to convert to an open procedure, bile duct injury, bile leak, cardiopulmonary issues, DVT, possible need for rehabilitation postoperatively given his advanced age, etc. They understand and agree to proceed. Surgery is tentatively scheduled on September 9 Current Plans Started Augmentin 875-125 MG Oral Tablet, 1 (one) Tablet two times daily, #14, 01/06/2018, No Refill.

## 2018-01-26 ENCOUNTER — Other Ambulatory Visit: Payer: Self-pay

## 2018-01-26 ENCOUNTER — Ambulatory Visit (HOSPITAL_COMMUNITY): Payer: Medicare Other | Admitting: Anesthesiology

## 2018-01-26 ENCOUNTER — Encounter (HOSPITAL_COMMUNITY): Admission: RE | Disposition: A | Discharge status: Home / Self Care | Payer: Self-pay | Source: Ambulatory Visit

## 2018-01-26 ENCOUNTER — Inpatient Hospital Stay (HOSPITAL_COMMUNITY)
Admission: RE | Admit: 2018-01-26 | Discharge: 2018-01-27 | DRG: 418 | Disposition: A | Discharge status: Home / Self Care | Payer: Medicare Other | Source: Ambulatory Visit | Attending: Surgery | Admitting: Surgery

## 2018-01-26 ENCOUNTER — Encounter (HOSPITAL_COMMUNITY): Payer: Self-pay | Admitting: *Deleted

## 2018-01-26 DIAGNOSIS — C8590 Non-Hodgkin lymphoma, unspecified, unspecified site: Secondary | ICD-10-CM | POA: Diagnosis present

## 2018-01-26 DIAGNOSIS — I10 Essential (primary) hypertension: Secondary | ICD-10-CM | POA: Diagnosis present

## 2018-01-26 DIAGNOSIS — Z23 Encounter for immunization: Secondary | ICD-10-CM | POA: Diagnosis not present

## 2018-01-26 DIAGNOSIS — K219 Gastro-esophageal reflux disease without esophagitis: Secondary | ICD-10-CM | POA: Diagnosis present

## 2018-01-26 DIAGNOSIS — Z79899 Other long term (current) drug therapy: Secondary | ICD-10-CM | POA: Diagnosis not present

## 2018-01-26 DIAGNOSIS — H9190 Unspecified hearing loss, unspecified ear: Secondary | ICD-10-CM | POA: Diagnosis present

## 2018-01-26 DIAGNOSIS — D034 Melanoma in situ of scalp and neck: Secondary | ICD-10-CM | POA: Diagnosis present

## 2018-01-26 DIAGNOSIS — K8010 Calculus of gallbladder with chronic cholecystitis without obstruction: Principal | ICD-10-CM | POA: Diagnosis present

## 2018-01-26 DIAGNOSIS — Z7982 Long term (current) use of aspirin: Secondary | ICD-10-CM

## 2018-01-26 DIAGNOSIS — N400 Enlarged prostate without lower urinary tract symptoms: Secondary | ICD-10-CM | POA: Diagnosis present

## 2018-01-26 DIAGNOSIS — M5412 Radiculopathy, cervical region: Secondary | ICD-10-CM | POA: Diagnosis present

## 2018-01-26 DIAGNOSIS — K819 Cholecystitis, unspecified: Secondary | ICD-10-CM | POA: Diagnosis present

## 2018-01-26 DIAGNOSIS — K8000 Calculus of gallbladder with acute cholecystitis without obstruction: Secondary | ICD-10-CM

## 2018-01-26 HISTORY — PX: LAPAROSCOPIC CHOLECYSTECTOMY: SUR755

## 2018-01-26 HISTORY — PX: CHOLECYSTECTOMY: SHX55

## 2018-01-26 SURGERY — LAPAROSCOPIC CHOLECYSTECTOMY
Anesthesia: General

## 2018-01-26 MED ORDER — ONDANSETRON HCL 4 MG/2ML IJ SOLN: 4.0000 mg | Freq: Once | INTRAMUSCULAR | Status: DC | PRN

## 2018-01-26 MED ORDER — DIPHENHYDRAMINE HCL 50 MG/ML IJ SOLN: 12.5000 mg | Freq: Four times a day (QID) | INTRAMUSCULAR | Status: DC | PRN

## 2018-01-26 MED ORDER — POTASSIUM CHLORIDE IN NACL 20-0.9 MEQ/L-% IV SOLN
INTRAVENOUS | Status: DC
  Administered 2018-01-26: 14:00:00 via INTRAVENOUS
  Filled 2018-01-26: qty 1000

## 2018-01-26 MED ORDER — ROCURONIUM BROMIDE 50 MG/5ML IV SOSY
PREFILLED_SYRINGE | INTRAVENOUS | Status: AC
  Filled 2018-01-26: qty 5

## 2018-01-26 MED ORDER — SUGAMMADEX SODIUM 200 MG/2ML IV SOLN
INTRAVENOUS | Status: DC | PRN
  Administered 2018-01-26: 200 mg via INTRAVENOUS

## 2018-01-26 MED ORDER — ACETAMINOPHEN 500 MG PO TABS
1000.0000 mg | ORAL_TABLET | Freq: Once | ORAL | Status: AC
  Administered 2018-01-26: 1000 mg via ORAL

## 2018-01-26 MED ORDER — GABAPENTIN 600 MG PO TABS
600.0000 mg | ORAL_TABLET | Freq: Two times a day (BID) | ORAL | Status: DC
  Administered 2018-01-26 – 2018-01-27 (×2): 600 mg via ORAL
  Filled 2018-01-26 (×2): qty 1

## 2018-01-26 MED ORDER — KETOROLAC TROMETHAMINE 30 MG/ML IJ SOLN
INTRAMUSCULAR | Status: AC
  Filled 2018-01-26: qty 1

## 2018-01-26 MED ORDER — PROPOFOL 10 MG/ML IV BOLUS
INTRAVENOUS | Status: AC
  Filled 2018-01-26: qty 20

## 2018-01-26 MED ORDER — BUPIVACAINE-EPINEPHRINE 0.25% -1:200000 IJ SOLN
INTRAMUSCULAR | Status: DC | PRN
  Administered 2018-01-26: 20 mL

## 2018-01-26 MED ORDER — ONDANSETRON 4 MG PO TBDP: 4.0000 mg | ORAL_TABLET | Freq: Four times a day (QID) | ORAL | Status: DC | PRN

## 2018-01-26 MED ORDER — BUPIVACAINE-EPINEPHRINE (PF) 0.25% -1:200000 IJ SOLN
INTRAMUSCULAR | Status: AC
  Filled 2018-01-26: qty 30

## 2018-01-26 MED ORDER — PHENYLEPHRINE 40 MCG/ML (10ML) SYRINGE FOR IV PUSH (FOR BLOOD PRESSURE SUPPORT)
PREFILLED_SYRINGE | INTRAVENOUS | Status: AC
  Filled 2018-01-26: qty 10

## 2018-01-26 MED ORDER — ONDANSETRON HCL 4 MG/2ML IJ SOLN: 4.0000 mg | Freq: Four times a day (QID) | INTRAMUSCULAR | Status: DC | PRN

## 2018-01-26 MED ORDER — FENTANYL CITRATE (PF) 100 MCG/2ML IJ SOLN
INTRAMUSCULAR | Status: DC | PRN
  Administered 2018-01-26 (×2): 50 ug via INTRAVENOUS

## 2018-01-26 MED ORDER — FENTANYL CITRATE (PF) 100 MCG/2ML IJ SOLN
INTRAMUSCULAR | Status: AC
  Administered 2018-01-26: 25 ug via INTRAVENOUS
  Filled 2018-01-26: qty 2

## 2018-01-26 MED ORDER — 0.9 % SODIUM CHLORIDE (POUR BTL) OPTIME
TOPICAL | Status: DC | PRN
  Administered 2018-01-26: 1000 mL

## 2018-01-26 MED ORDER — DEXMEDETOMIDINE HCL 200 MCG/2ML IV SOLN
INTRAVENOUS | Status: DC | PRN
  Administered 2018-01-26: 8 ug via INTRAVENOUS
  Administered 2018-01-26: 4 ug via INTRAVENOUS

## 2018-01-26 MED ORDER — KETOROLAC TROMETHAMINE 15 MG/ML IJ SOLN: 15.0000 mg | Freq: Once | INTRAMUSCULAR | Status: DC | PRN

## 2018-01-26 MED ORDER — CEFAZOLIN SODIUM-DEXTROSE 2-4 GM/100ML-% IV SOLN
INTRAVENOUS | Status: AC
  Filled 2018-01-26: qty 100

## 2018-01-26 MED ORDER — DIPHENHYDRAMINE HCL 12.5 MG/5ML PO ELIX: 12.5000 mg | ORAL_SOLUTION | Freq: Four times a day (QID) | ORAL | Status: DC | PRN

## 2018-01-26 MED ORDER — PHENYLEPHRINE 40 MCG/ML (10ML) SYRINGE FOR IV PUSH (FOR BLOOD PRESSURE SUPPORT)
PREFILLED_SYRINGE | INTRAVENOUS | Status: DC | PRN
  Administered 2018-01-26: 120 ug via INTRAVENOUS
  Administered 2018-01-26 (×2): 80 ug via INTRAVENOUS

## 2018-01-26 MED ORDER — ENOXAPARIN SODIUM 40 MG/0.4ML ~~LOC~~ SOLN
40.0000 mg | SUBCUTANEOUS | Status: DC
  Administered 2018-01-27: 40 mg via SUBCUTANEOUS
  Filled 2018-01-26: qty 0.4

## 2018-01-26 MED ORDER — MORPHINE SULFATE (PF) 2 MG/ML IV SOLN: 1.0000 mg | INTRAVENOUS | Status: DC | PRN

## 2018-01-26 MED ORDER — PROPOFOL 10 MG/ML IV BOLUS
INTRAVENOUS | Status: DC | PRN
  Administered 2018-01-26: 70 mg via INTRAVENOUS

## 2018-01-26 MED ORDER — ONDANSETRON HCL 4 MG/2ML IJ SOLN
INTRAMUSCULAR | Status: AC
  Filled 2018-01-26: qty 2

## 2018-01-26 MED ORDER — KETOROLAC TROMETHAMINE 30 MG/ML IJ SOLN
INTRAMUSCULAR | Status: DC | PRN
  Administered 2018-01-26: 15 mg via INTRAVENOUS

## 2018-01-26 MED ORDER — TAMSULOSIN HCL 0.4 MG PO CAPS
0.4000 mg | ORAL_CAPSULE | Freq: Every day | ORAL | Status: DC
  Administered 2018-01-27: 0.4 mg via ORAL
  Filled 2018-01-26: qty 1

## 2018-01-26 MED ORDER — ROCURONIUM BROMIDE 50 MG/5ML IV SOSY
PREFILLED_SYRINGE | INTRAVENOUS | Status: DC | PRN
  Administered 2018-01-26: 30 mg via INTRAVENOUS

## 2018-01-26 MED ORDER — TRAMADOL HCL 50 MG PO TABS
50.0000 mg | ORAL_TABLET | Freq: Four times a day (QID) | ORAL | Status: DC | PRN
  Administered 2018-01-26 – 2018-01-27 (×2): 50 mg via ORAL
  Filled 2018-01-26: qty 1

## 2018-01-26 MED ORDER — CEFAZOLIN SODIUM-DEXTROSE 2-4 GM/100ML-% IV SOLN
2.0000 g | INTRAVENOUS | Status: AC
  Administered 2018-01-26: 2 g via INTRAVENOUS

## 2018-01-26 MED ORDER — ENSURE ENLIVE PO LIQD
237.0000 mL | Freq: Two times a day (BID) | ORAL | Status: DC
  Administered 2018-01-27: 237 mL via ORAL

## 2018-01-26 MED ORDER — LIDOCAINE 2% (20 MG/ML) 5 ML SYRINGE
INTRAMUSCULAR | Status: AC
  Filled 2018-01-26: qty 5

## 2018-01-26 MED ORDER — FENTANYL CITRATE (PF) 250 MCG/5ML IJ SOLN
INTRAMUSCULAR | Status: AC
  Filled 2018-01-26: qty 5

## 2018-01-26 MED ORDER — ONDANSETRON HCL 4 MG/2ML IJ SOLN
INTRAMUSCULAR | Status: DC | PRN
  Administered 2018-01-26: 4 mg via INTRAVENOUS

## 2018-01-26 MED ORDER — FENTANYL CITRATE (PF) 100 MCG/2ML IJ SOLN
25.0000 ug | INTRAMUSCULAR | Status: DC | PRN
  Administered 2018-01-26: 25 ug via INTRAVENOUS

## 2018-01-26 MED ORDER — TRAMADOL HCL 50 MG PO TABS
ORAL_TABLET | ORAL | Status: AC
  Administered 2018-01-26: 50 mg via ORAL
  Filled 2018-01-26: qty 1

## 2018-01-26 MED ORDER — ACETAMINOPHEN 500 MG PO TABS
ORAL_TABLET | ORAL | Status: AC
  Administered 2018-01-26: 1000 mg via ORAL
  Filled 2018-01-26: qty 2

## 2018-01-26 MED ORDER — IBUPROFEN 600 MG PO TABS: 600.0000 mg | ORAL_TABLET | Freq: Four times a day (QID) | ORAL | Status: DC | PRN

## 2018-01-26 MED ORDER — LIDOCAINE 2% (20 MG/ML) 5 ML SYRINGE
INTRAMUSCULAR | Status: DC | PRN
  Administered 2018-01-26: 100 mg via INTRAVENOUS

## 2018-01-26 MED ORDER — LACTATED RINGERS IV SOLN
INTRAVENOUS | Status: DC
  Administered 2018-01-26: 08:00:00 via INTRAVENOUS

## 2018-01-26 SURGICAL SUPPLY — 31 items
APPLIER CLIP 5 13 M/L LIGAMAX5 (MISCELLANEOUS) ×3
CANISTER SUCT 3000ML PPV (MISCELLANEOUS) ×3 IMPLANT
CHLORAPREP W/TINT 26ML (MISCELLANEOUS) ×3 IMPLANT
CLIP APPLIE 5 13 M/L LIGAMAX5 (MISCELLANEOUS) ×1 IMPLANT
COVER SURGICAL LIGHT HANDLE (MISCELLANEOUS) ×3 IMPLANT
DERMABOND ADVANCED (GAUZE/BANDAGES/DRESSINGS) ×2
DERMABOND ADVANCED .7 DNX12 (GAUZE/BANDAGES/DRESSINGS) ×1 IMPLANT
ELECT REM PT RETURN 9FT ADLT (ELECTROSURGICAL) ×3
ELECTRODE REM PT RTRN 9FT ADLT (ELECTROSURGICAL) ×1 IMPLANT
GLOVE SURG SIGNA 7.5 PF LTX (GLOVE) ×3 IMPLANT
GOWN STRL REUS W/ TWL LRG LVL3 (GOWN DISPOSABLE) ×3 IMPLANT
GOWN STRL REUS W/ TWL XL LVL3 (GOWN DISPOSABLE) ×1 IMPLANT
GOWN STRL REUS W/TWL LRG LVL3 (GOWN DISPOSABLE) ×6
GOWN STRL REUS W/TWL XL LVL3 (GOWN DISPOSABLE) ×2
KIT BASIN OR (CUSTOM PROCEDURE TRAY) ×3 IMPLANT
KIT TURNOVER KIT B (KITS) ×3 IMPLANT
NS IRRIG 1000ML POUR BTL (IV SOLUTION) ×3 IMPLANT
PAD ARMBOARD 7.5X6 YLW CONV (MISCELLANEOUS) ×3 IMPLANT
POUCH SPECIMEN RETRIEVAL 10MM (ENDOMECHANICALS) ×3 IMPLANT
SCISSORS LAP 5X35 DISP (ENDOMECHANICALS) ×3 IMPLANT
SET IRRIG TUBING LAPAROSCOPIC (IRRIGATION / IRRIGATOR) ×3 IMPLANT
SLEEVE ENDOPATH XCEL 5M (ENDOMECHANICALS) ×6 IMPLANT
SPECIMEN JAR SMALL (MISCELLANEOUS) ×3 IMPLANT
SUT MNCRL AB 4-0 PS2 18 (SUTURE) ×3 IMPLANT
TOWEL OR 17X24 6PK STRL BLUE (TOWEL DISPOSABLE) IMPLANT
TOWEL OR 17X26 10 PK STRL BLUE (TOWEL DISPOSABLE) ×3 IMPLANT
TRAY LAPAROSCOPIC MC (CUSTOM PROCEDURE TRAY) ×3 IMPLANT
TROCAR XCEL BLUNT TIP 100MML (ENDOMECHANICALS) ×3 IMPLANT
TROCAR XCEL NON-BLD 5MMX100MML (ENDOMECHANICALS) ×3 IMPLANT
TUBING INSUFFLATION (TUBING) ×3 IMPLANT
WATER STERILE IRR 1000ML POUR (IV SOLUTION) ×3 IMPLANT

## 2018-01-26 NOTE — Anesthesia Procedure Notes (Signed)
Procedure Name: Intubation Date/Time: 01/26/2018 9:07 AM Performed by: Babs Bertin, CRNA Pre-anesthesia Checklist: Patient identified, Emergency Drugs available, Suction available and Patient being monitored Patient Re-evaluated:Patient Re-evaluated prior to induction Oxygen Delivery Method: Circle system utilized Preoxygenation: Pre-oxygenation with 100% oxygen Induction Type: IV induction Ventilation: Mask ventilation without difficulty Laryngoscope Size: Mac and 3 Grade View: Grade I Tube type: Oral Tube size: 7.5 mm Number of attempts: 1 Airway Equipment and Method: Stylet Placement Confirmation: ETT inserted through vocal cords under direct vision,  positive ETCO2 and breath sounds checked- equal and bilateral Secured at: 21 cm Dental Injury: Teeth and Oropharynx as per pre-operative assessment

## 2018-01-26 NOTE — Interval H&P Note (Signed)
History and Physical Interval Note:  No change in H and P  01/26/2018 8:36 AM  Jose [name]  has presented today for surgery, with the diagnosis of CHRONIC CHOLECYSTITIS WITH STONES  The various methods of treatment have been discussed with the patient and family. After consideration of risks, benefits and other options for treatment, the patient has consented to  Procedure(s): LAPAROSCOPIC CHOLECYSTECTOMY (N/A) as a surgical intervention .  The patient's history has been reviewed, patient examined, no change in status, stable for surgery.  I have reviewed the patient's chart and labs.  Questions were answered to the patient's satisfaction.     Masaki Rothbauer A

## 2018-01-26 NOTE — Transfer of Care (Signed)
Immediate Anesthesia Transfer of Care Note  Patient: Jose [name]  Procedure(s) Performed: LAPAROSCOPIC CHOLECYSTECTOMY (N/A )  Patient Location: PACU  Anesthesia Type:General  Level of Consciousness: awake, alert  and oriented  Airway & Oxygen Therapy: Patient Spontanous Breathing and Patient connected to nasal cannula oxygen  Post-op Assessment: Report given to RN and Post -op Vital signs reviewed and stable  Post vital signs: Reviewed and stable  Last Vitals:  Vitals Value Taken Time  BP 159/93 01/26/2018  9:57 AM  Temp    Pulse 115 01/26/2018  9:58 AM  Resp 22 01/26/2018  9:58 AM  SpO2 99 % 01/26/2018  9:58 AM  Vitals shown include unvalidated device data.  Last Pain:  Vitals:   01/26/18 0741  TempSrc:   PainSc: 5          Complications: No apparent anesthesia complications

## 2018-01-26 NOTE — Op Note (Signed)
LAPAROSCOPIC CHOLECYSTECTOMY  Procedure Note  Jose [name] [facility] 01/26/2018   Pre-op Diagnosis: CHRONIC CHOLECYSTITIS WITH STONES     Post-op Diagnosis: same  Procedure(s): LAPAROSCOPIC CHOLECYSTECTOMY  Surgeon(s): Coralie Keens, MD Ralene Ok, MD  Anesthesia: General  Staff:  Circulator: Farrel Demark, RN Relief Circulator: Rozell Searing, RN Scrub Person: Marlowe Kays, RN; Nicholos Johns, RN  Estimated Blood Loss: Minimal               Specimens: sent to path  Indications: This is [name] 84 year old gentleman who had [name] percutaneous cholecystostomy tube placed at [facility] last month for cholecystitis.  He now presents for cholecystectomy  Procedure: The patient was brought to the operating room and identified as the correct patient.  He was placed supine on the operating table and general anesthesia was induced.  His abdomen was then prepped and draped in the usual sterile fashion.  I made [name] small incision just below the umbilicus with [name] scalpel.  I took this down to the fascia which was then opened with [name] scalpel as well.  [name] hemostat was then used to pass into the peritoneal cavity under direct vision.  [name] 0 Vicryl pursestring suture was then placed around the fascial opening.  [facility] port was placed to the opening and insufflation of the abdomen was begun.  [name] 5 mm trocar was then placed the patient's epigastrium and 2 more in the right upper quadrant both under direct vision.  The gallbladder can be seen along with omentum stuck up to the abdominal wall from the percutaneous tube.  We were able to easily take the omentum off of this.  We then dissected down to the base of the gallbladder.  The cystic duct and cystic artery were identified.  [name] critical window was achieved around both.  I then clipped the artery twice proximally once distally and then the cystic duct 3 times proximally once distally and transected both.  The gallbladder was then slowly  dissected free from the liver bed with electrocautery.  Once it was free from liver bed I cut the drain and removed it at the abdominal wall.  We then dissected the rest of the gallbladder free from the abdominal wall.  The gallbladder was then placed in an Endosac and removed through the incision at the umbilicus.  We then thoroughly irrigated the right upper quadrant with normal saline.  Hemostasis appeared to be achieved.  At this point, all ports were removed under direct vision the abdomen was deflated.  0 Vicryl at the umbilicus was tied in place closing the fascial defect.  All incisions were then anesthetized with Marcaine and closed with 4-0 Monocryl sutures.  Dermabond was then applied.  The patient tolerated the procedure well.  All the counts were correct at the end of the procedure.  The patient was then extubated in the operating room and taken in [name] stable condition to the recovery room.          Jose [name]   Date: 01/26/2018  Time: 9:51 AM

## 2018-01-26 NOTE — Anesthesia Postprocedure Evaluation (Signed)
Anesthesia Post Note  Patient: Jose [name]  Procedure(s) Performed: LAPAROSCOPIC CHOLECYSTECTOMY (N/A )     Patient location during evaluation: PACU Anesthesia Type: General Level of consciousness: awake and alert Pain management: pain level controlled Vital Signs Assessment: post-procedure vital signs reviewed and stable Respiratory status: spontaneous breathing, nonlabored ventilation, respiratory function stable and patient connected to nasal cannula oxygen Cardiovascular status: blood pressure returned to baseline and stable Postop Assessment: no apparent nausea or vomiting Anesthetic complications: no    Last Vitals:  Vitals:   01/26/18 1130 01/26/18 1208  BP: 117/61 (!) 122/92  Pulse: 78 70  Resp: (!) 22 16  Temp:  36.9 C  SpO2: 98% 98%    Last Pain:  Vitals:   01/26/18 1208  TempSrc: Oral  PainSc:                  [place]et [place]

## 2018-01-26 NOTE — Discharge Instructions (Signed)
[facility] ______[facility] °LAPAROSCOPIC SURGERY: POST OP INSTRUCTIONS °Always review your discharge instruction sheet given to you by the facility where your surgery was performed. °IF YOU HAVE DISABILITY OR FAMILY LEAVE FORMS, YOU MUST BRING THEM TO THE OFFICE FOR PROCESSING.   °DO NOT GIVE THEM TO YOUR DOCTOR. ° °1. A prescription for pain medication may be given to you upon discharge.  Take your pain medication as prescribed, if needed.  If narcotic pain medicine is not needed, then you may take acetaminophen (Tylenol) or ibuprofen (Advil) as needed. °2. Take your usually prescribed medications unless otherwise directed. °3. If you need a refill on your pain medication, please contact your pharmacy.  They will contact our office to request authorization. Prescriptions will not be filled after 5pm or on week-ends. °4. You should follow a light diet the first few days after arrival home, such as soup and crackers, etc.  Be sure to include lots of fluids daily. °5. Most patients will experience some swelling and bruising in the area of the incisions.  Ice packs will help.  Swelling and bruising can take several days to resolve.  °6. It is common to experience some constipation if taking pain medication after surgery.  Increasing fluid intake and taking a stool softener (such as Colace) will usually help or prevent this problem from occurring.  A mild laxative (Milk of Magnesia or Miralax) should be taken according to package instructions if there are no bowel movements after 48 hours. °7. Unless discharge instructions indicate otherwise, you may remove your bandages 24-48 hours after surgery, and you may shower at that time.  You may have steri-strips (small skin tapes) in place directly over the incision.  These strips should be left on the skin for 7-10 days.  If your surgeon used skin glue on the incision, you may shower in 24 hours.  The glue will flake off over the next 2-3 weeks.  Any sutures or  staples will be removed at the office during your follow-up visit. °8. ACTIVITIES:  You may resume regular (light) daily activities beginning the next day--such as daily self-care, walking, climbing stairs--gradually increasing activities as tolerated.  You may have sexual intercourse when it is comfortable.  Refrain from any heavy lifting or straining until approved by your doctor. °a. You may drive when you are no longer taking prescription pain medication, you can comfortably wear a seatbelt, and you can safely maneuver your car and apply brakes. °b. RETURN TO WORK:  __________________________________________________________ °9. You should see your doctor in the office for a follow-up appointment approximately 2-3 weeks after your surgery.  Make sure that you call for this appointment within a day or two after you arrive home to insure a convenient appointment time. °10. OTHER INSTRUCTIONS: __________________________________________________________________________________________________________________________ __________________________________________________________________________________________________________________________ °WHEN TO CALL YOUR DOCTOR: °1. Fever over 101.0 °2. Inability to urinate °3. Continued bleeding from incision. °4. Increased pain, redness, or drainage from the incision. °5. Increasing abdominal pain ° °The clinic staff is available to answer your questions during regular business hours.  Please don’t hesitate to call and ask to speak to one of the nurses for clinical concerns.  If you have a medical emergency, go to the nearest emergency room or call 911.  A surgeon from [facility] is always on call at the hospital. °[address], [address], [place], [place]  [address] ? [address] [address], [place],    [address] °[phone] ? [phone] ? FAX [phone] °Web site:   [email or web address] ° ° °Laparoscopic Cholecystectomy, Care After °This sheet  gives you information about how to care for yourself after your procedure. Your health care provider may also give you more specific instructions. If you have problems or questions, contact your health care provider. °What can I expect after the procedure? °After the procedure, it is common to have: °· Pain at your incision sites. You will be given medicines to control this pain. °· Mild nausea or vomiting. °· Bloating and possible shoulder pain from the air-like gas that was used during the procedure. ° °Follow these instructions at home: °Incision care ° °· Follow instructions from your health care provider about how to take care of your incisions. Make sure you: °? Wash your hands with soap and water before you change your bandage (dressing). If soap and water are not available, use hand sanitizer. °? Change your dressing as told by your health care provider. °? Leave stitches (sutures), skin glue, or adhesive strips in place. These skin closures may need to be in place for 2 weeks or longer. If adhesive strip edges start to loosen and curl up, you may trim the loose edges. Do not remove adhesive strips completely unless your health care provider tells you to do that. °· Do not take baths, swim, or use a hot tub until your health care provider approves. Ask your health care provider if you can take showers. You may only be allowed to take sponge baths for bathing. °· Check your incision area every day for signs of infection. Check for: °? More redness, swelling, or pain. °? More fluid or blood. °? Warmth. °? Pus or a bad smell. °Activity °· Do not drive or use heavy machinery while taking prescription pain medicine. °· Do not lift anything that is heavier than 10 lb (4.5 kg) until your health care provider approves. °· Do not play contact sports until your health care provider approves. °· Do not drive for 24 hours if you were given a medicine to help you relax (sedative). °· Rest as needed. Do not return to work  or school until your health care provider approves. °General instructions °· Take over-the-counter and prescription medicines only as told by your health care provider. °· To prevent or treat constipation while you are taking prescription pain medicine, your health care provider may recommend that you: °? Drink enough fluid to keep your urine clear or pale yellow. °? Take over-the-counter or prescription medicines. °? Eat foods that are high in fiber, such as fresh fruits and vegetables, whole grains, and beans. °? Limit foods that are high in fat and processed sugars, such as fried and sweet foods. °Contact a health care provider if: °· You develop a rash. °· You have more redness, swelling, or pain around your incisions. °· You have more fluid or blood coming from your incisions. °· Your incisions feel warm to the touch. °· You have pus or a bad smell coming from your incisions. °· You have a fever. °· One or more of your incisions breaks open. °Get help right away if: °· You have trouble breathing. °· You have chest pain. °· You have increasing pain in your shoulders. °· You faint or feel dizzy when you stand. °· You have severe pain in your abdomen. °· You have nausea or vomiting that lasts for more than one day. °· You have leg pain. °This information is not intended to replace advice given to you by your health care provider. Make sure you   discuss any questions you have with your health care provider. °Document Released: 05/06/2005 Document Revised: 11/25/2015 Document Reviewed: 10/23/2015 °Elsevier Interactive Patient Education © 2018 [employer] ° °

## 2018-01-27 ENCOUNTER — Encounter (HOSPITAL_COMMUNITY): Payer: Self-pay | Admitting: Surgery

## 2018-01-27 DIAGNOSIS — K8010 Calculus of gallbladder with chronic cholecystitis without obstruction: Secondary | ICD-10-CM | POA: Diagnosis present

## 2018-01-27 DIAGNOSIS — Z23 Encounter for immunization: Secondary | ICD-10-CM | POA: Diagnosis not present

## 2018-01-27 LAB — BASIC METABOLIC PANEL
Anion gap: 7 (ref 5–15)
BUN: 18 mg/dL (ref 8–23)
CALCIUM: 8.5 mg/dL — AB (ref 8.9–10.3)
CO2: 21 mmol/L — ABNORMAL LOW (ref 22–32)
CREATININE: 0.87 mg/dL (ref 0.61–1.24)
Chloride: 110 mmol/L (ref 98–111)
GFR calc Af Amer: >60 mL/min (ref >60)
GFR calc non Af Amer: >60 mL/min (ref >60)
Glucose, Bld: 106 mg/dL — ABNORMAL HIGH (ref 70–99)
Potassium: 4.3 mmol/L (ref 3.5–5.1)
Sodium: 138 mmol/L (ref 135–145)

## 2018-01-27 LAB — CBC
HCT: 27.4 % — ABNORMAL LOW (ref 39.0–52.0)
Hemoglobin: 9 g/dL — ABNORMAL LOW (ref 13.0–17.0)
MCH: 34 pg (ref 26.0–34.0)
MCHC: 32.8 g/dL (ref 30.0–36.0)
MCV: 103.4 fL — ABNORMAL HIGH (ref 78.0–100.0)
PLATELETS: 146 10*3/uL — AB (ref 150–400)
RBC: 2.65 MIL/uL — ABNORMAL LOW (ref 4.22–5.81)
RDW: 17.2 % — AB (ref 11.5–15.5)
WBC: 5 10*3/uL (ref 4.0–10.5)

## 2018-01-27 MED ORDER — TRAMADOL HCL 50 MG PO TABS: 50.0000 mg | ORAL_TABLET | Freq: Four times a day (QID) | ORAL | 0 refills | Status: DC | PRN

## 2018-01-27 MED ORDER — INFLUENZA VAC SPLIT HIGH-DOSE 0.5 ML IM SUSY
0.5000 mL | PREFILLED_SYRINGE | INTRAMUSCULAR | Status: AC
  Administered 2018-01-27: 0.5 mL via INTRAMUSCULAR
  Filled 2018-01-27: qty 0.5

## 2018-01-27 MED ORDER — ACETAMINOPHEN 500 MG PO TABS
1000.0000 mg | ORAL_TABLET | Freq: Three times a day (TID) | ORAL | Status: DC
  Administered 2018-01-27: 1000 mg via ORAL
  Filled 2018-01-27: qty 2

## 2018-01-27 NOTE — Evaluation (Signed)
Occupational Therapy Evaluation Patient Details Name: Jose [name] MRN: [id number] DOB: 16-Apr-1924 Today's Date: 01/27/2018    History of Present Illness 84 y.o. male admitted on 01/26/18 for chronic cholecystitis with stones, s/p laparoscopic cholecystetomy.  Pt recently was at [facility] for IR drain placement 12/28/17 also due to chronic cholecystitis and cholelithiasis.  Other significant PMH of HOHHTN, cerical radiculopathy, and back problems.   Clinical Impression   Patient evaluated by Occupational Therapy with no further acute OT needs identified. All education has been completed and the patient has no further questions. See below for any follow-up Occupational Therapy or equipment needs. OT to sign off. Thank you for referral.      Follow Up Recommendations  No OT follow up    Equipment Recommendations  None recommended by OT    Recommendations for Other Services       Precautions / Restrictions Precautions Precautions: Fall Precaution Comments: pt is a bit unsteady on his feet with just the cane      Mobility Bed Mobility               General bed mobility comments: Pt was OOB in the recliner chair.   Transfers Overall transfer level: Needs assistance Equipment used: Straight cane Transfers: Sit to/from Stand Sit to Stand: Supervision         General transfer comment: Min guard assist for safety    Balance Overall balance assessment: Needs assistance Sitting-balance support: Feet supported;No upper extremity supported Sitting balance-Leahy Scale: Good     Standing balance support: Single extremity supported Standing balance-Leahy Scale: Fair                             ADL either performed or assessed with clinical judgement   ADL Overall ADL's : Modified independent                                       General ADL Comments: pt completed sink level bathing, don LB dressing "socks shoes" simulated tub transfer and  educated to have phone on his person when ambulating/ especially when outside on the swing. Pt uses a ramp to get in and out of the house     Vision   Vision Assessment?: No apparent visual deficits     Perception     Praxis      Pertinent Vitals/Pain Pain Assessment: Faces Faces Pain Scale: Hurts little more Pain Location: R LQ Pain Descriptors / Indicators: Aching Pain Intervention(s): Limited activity within patient's tolerance;Premedicated before session;Repositioned     Hand Dominance Right   Extremity/Trunk Assessment Upper Extremity Assessment Upper Extremity Assessment: Overall WFL for tasks assessed   Lower Extremity Assessment Lower Extremity Assessment: Defer to PT evaluation   Cervical / Trunk Assessment Cervical / Trunk Assessment: Kyphotic   Communication Communication Communication: HOH   Cognition Arousal/Alertness: Awake/alert Behavior During Therapy: WFL for tasks assessed/performed                                   General Comments: some mild confusion, covering with jokes, seems to be his baseline.    General Comments  c/o of discomfort on R lateral side at ribs but able to complete adls     Exercises     Shoulder Instructions  Home Living Family/patient expects to be discharged to:: Private residence Living Arrangements: Alone(with his [place] Dog "Adrian Blackwater") Available Help at Discharge: Family Type of Home: House Home Access: Ramped entrance     [place]: One level     Bathroom Shower/Tub: Teacher, early years/pre: Standard Bathroom Accessibility: Yes   Home Equipment: Cane - single point;Walker - 2 wheels   Additional Comments: has a bulldog and the dog (winston) lets himself in and out . patient has family (A) for watering the dog and can use the water hose      Prior Functioning/Environment Level of Independence: Needs assistance  Gait / Transfers Assistance Needed: walks with a cane,  sometimes a walker, bathes from the sink, is active with HH PT.  Drives ADL's / Homemaking Assistance Needed: Has a housekeeper, daughter in law cooks some for him, what meals he does cook are microwave.  Communication / Swallowing Assistance Needed: very HOH          OT Problem List:        OT Treatment/Interventions:      OT Goals(Current goals can be found in the care plan section) Acute Rehab OT Goals Patient Stated Goal: to go home today OT Goal Formulation: With patient/family Potential to Achieve Goals: Good  OT Frequency:     Barriers to D/C:            Co-evaluation              AM-PAC PT "6 Clicks" Daily Activity     Outcome Measure Help from another person eating meals?: None Help from another person taking care of personal grooming?: None Help from another person toileting, which includes using toliet, bedpan, or urinal?: None Help from another person bathing (including washing, rinsing, drying)?: None Help from another person to put on and taking off regular upper body clothing?: None Help from another person to put on and taking off regular lower body clothing?: None 6 Click Score: 24   End of Session Nurse Communication: Mobility status;Precautions  Activity Tolerance: Patient tolerated treatment well Patient left: in chair;with call bell/phone within reach;with family/visitor present                   Time: 8466-5993 OT Time Calculation (min): 46 min Charges:  OT General Charges $OT Visit: 1 Visit OT Evaluation $OT Eval Moderate Complexity: 1 Mod OT Treatments $Self Care/Home Management : 23-37 mins   Jeri Modena, OTR/L  Acute Rehabilitation Services Pager: [phone] Office: [phone] . Parke Poisson B 01/27/2018, 2:00 PM

## 2018-01-27 NOTE — Discharge Summary (Signed)
Physician Discharge Summary  Patient ID: Jose [name] MRN: [id number] DOB/AGE: 1923/06/17 84 y.o.  Admit date: 01/26/2018 Discharge date: 01/27/2018  Admission Diagnoses:  Chronic cholecystitis with cholelithiasis/percutaneous IR drain placement 12/28/17,[facility]  BPH Hypertension Lymphoma Back pain/cervical radiculopathy Hearing loss  Discharge Diagnoses:  Same  Active Problems:   Cholecystitis   PROCEDURES: BPH Hypertension Lymphoma Back pain/cervical radiculopathy Hearing loss  Hospital Course:  The patient is a 84 year old male who presents for evaluation of gall stones. This patient is sent over by Dr. Viviana Simpler for cholecystitis. He is a 84 year old gentleman who was seen at [facility] on August 11. He was found to have cholecystitis with a stone in the gallbladder neck. He ended up getting a percutaneous cholecystostomy today after admission for confusion and fever. He was then discharged 2 days later with a drain in place. He is otherwise fairly healthy for his age except for hard of hearing. He does walk with a walker. He was sent here by his primary care physician for planning for definitive surgery for his gallbladder. Currently he has no abdominal pain and no fever.  Pt was admitted to the hospital and taken to the OR after an office visit with the above history.  He did well with surgery and returned to the floor. He was rather wobbly the first post op morning and we had PT and OT see him.  He already has home health PT ordered.  He was mobilized, his diet was increased and he did well with this.  He was ready for discharge the following POD #1, afternoon.  Follow up as noted below.    Condition on DC:  Improved.     CBC Latest Ref Rng & Units 01/27/2018 01/21/2018 01/04/2018  WBC 4.0 - 10.5 K/uL 5.0 5.3 5.5  Hemoglobin 13.0 - 17.0 g/dL 9.0(L) 12.0(L) 10.9(L)  Hematocrit 39.0 - 52.0 % 27.4(L) 36.5(L) 30.9(L)  Platelets 150  - 400 K/uL 146(L) 149(L) 178   CMP Latest Ref Rng & Units 01/27/2018 01/21/2018 01/05/2018  Glucose 70 - 99 mg/dL 106(H) 131(H) 110(H)  BUN 8 - 23 mg/dL 18 27(H) 19  Creatinine 0.61 - 1.24 mg/dL 0.87 0.94 0.67  Sodium 135 - 145 mmol/L 138 140 141  Potassium 3.5 - 5.1 mmol/L 4.3 4.8 3.1(L)  Chloride 98 - 111 mmol/L 110 108 113(H)  CO2 22 - 32 mmol/L 21(L) 22 21(L)  Calcium 8.9 - 10.3 mg/dL 8.5(L) 9.4 8.3(L)  Total Protein 6.5 - 8.1 g/dL - - 5.9(L)  Total Bilirubin 0.3 - 1.2 mg/dL - - 0.8  Alkaline Phos 38 - 126 U/L - - 106  AST 15 - 41 U/L - - 37  ALT 0 - 44 U/L - - 82(H)    Disposition: Discharge disposition: 01-Home or Self Care        Allergies as of 01/27/2018   No Known Allergies     Medication List    STOP taking these medications   ibuprofen 200 MG tablet Commonly known as:  ADVIL,MOTRIN   sodium chloride flush 0.9 % Soln Commonly known as:  NS     TAKE these medications   acetaminophen 500 MG tablet Commonly known as:  TYLENOL Take 1,000 mg by mouth every 6 (six) hours as needed.   aspirin 81 MG tablet Take 81 mg by mouth daily.   ferrous sulfate 325 (65 FE) MG EC tablet Take 325 mg by mouth daily with breakfast.   gabapentin 600 MG tablet  Commonly known as:  NEURONTIN TAKE 1 TABLET (600 MG TOTAL) BY MOUTH 2 (TWO) TIMES DAILY. What changed:  See the new instructions.   ketoconazole 2 % cream Commonly known as:  NIZORAL Apply 1 application topically 2 (two) times daily as needed for irritation.   potassium chloride SA 20 MEQ tablet Commonly known as:  K-DUR,KLOR-CON Take 2 tablets (40 mEq total) by mouth daily. What changed:    how much to take  when to take this   tamsulosin 0.4 MG Caps capsule Commonly known as:  FLOMAX TAKE 1 CAPSULE (0.4 MG TOTAL) BY MOUTH DAILY. What changed:  See the new instructions.   traMADol 50 MG tablet Commonly known as:  ULTRAM Take 1 tablet (50 mg total) by mouth every 6 (six) hours as needed (mild pain).    triamcinolone cream 0.1 % Commonly known as:  KENALOG Apply 1 application topically 2 (two) times daily as needed. What changed:  reasons to take this   vitamin B-12 500 MCG tablet Commonly known as:  CYANOCOBALAMIN Take 500 mcg by mouth daily.      Follow-up Information    Coralie Keens, MD Follow up on 02/18/2018.   Specialty:  General Surgery Why:  Your appointment is at 9:10 AM, be at the office 30 minutes early for check in.  Bring photo ID and insurance information.   Contact information: [address] [place] [address] [phone]           Signed: Earnstine Regal 01/27/2018, 3:14 PM

## 2018-01-27 NOTE — Progress Notes (Addendum)
1 Day Post-Op    CC: Gallbladder disease  Subjective: He is extremely hard of hearing.  His biggest complaint right now is the drain site.  He is getting up now with assistance.  We will decide whether he needs OT and PT evaluation.  He says he lives alone with his bulldog.  He seems fairly comfortable and moves well.  He keeps saying we did a good job.  Objective: Vital signs in last 24 hours: Temp:  [97.3 F (36.3 C)-98.4 F (36.9 C)] 98.1 F (36.7 C) (09/10 0640) Pulse Rate:  [70-114] 95 (09/10 0640) Resp:  [14-26] 17 (09/10 0640) BP: (105-159)/(58-93) 110/80 (09/10 0640) SpO2:  [92 %-100 %] 92 % (09/10 0640) Last BM Date: 01/25/18 600 PO 1735 IV Urine x 1 recorded Afebrile, VSS Labs OK NO IOC   Intake/Output from previous day: 09/09 0701 - 09/10 0700 In: 2335.3 [P.O.:600; I.V.:1735.3] Out: 30 [Blood:30] Intake/Output this shift: No intake/output data recorded.  General appearance: alert, cooperative and no distress Resp: clear to auscultation bilaterally GI: Soft, sore, sites all look fine.  Site of the prior IR drain appears to be the most bothersome so far.  Lab Results:  Recent Labs    01/27/18 0515  WBC 5.0  HGB 9.0*  HCT 27.4*  PLT 146*    BMET Recent Labs    01/27/18 0515  NA 138  K 4.3  CL 110  CO2 21*  GLUCOSE 106*  BUN 18  CREATININE 0.87  CALCIUM 8.5*   PT/INR No results for input(s): LABPROT, INR in the last 72 hours.  No results for input(s): AST, ALT, ALKPHOS, BILITOT, PROT, ALBUMIN in the last 168 hours.   Lipase     Component Value Date/Time   LIPASE 28 01/04/2018 0453   Prior to Admission medications   Medication Sig Start Date End Date Taking? Authorizing Provider  acetaminophen (TYLENOL) 500 MG tablet Take 1,000 mg by mouth every 6 (six) hours as needed.   Yes [provider]  aspirin 81 MG tablet Take 81 mg by mouth daily.     Yes [provider]  ferrous sulfate 325 (65 FE) MG EC tablet Take 325 mg by  mouth daily with breakfast.   Yes [provider]  gabapentin (NEURONTIN) 600 MG tablet TAKE 1 TABLET (600 MG TOTAL) BY MOUTH 2 (TWO) TIMES DAILY. Patient taking differently: Take 600 mg by mouth 2 (two) times daily.  07/21/17  Yes [name], MD  ibuprofen (ADVIL,MOTRIN) 200 MG tablet Take 200-400 mg by mouth 3 (three) times daily as needed (Knee pain).    Yes [provider]  ketoconazole (NIZORAL) 2 % cream Apply 1 application topically 2 (two) times daily as needed for irritation. 05/07/17  Yes [name], MD  potassium chloride SA (K-DUR,KLOR-CON) 20 MEQ tablet Take 2 tablets (40 mEq total) by mouth daily. Patient taking differently: Take 20 mEq by mouth 2 (two) times daily.  01/06/18  Yes Viviana Simpler I, MD  tamsulosin (FLOMAX) 0.4 MG CAPS capsule TAKE 1 CAPSULE (0.4 MG TOTAL) BY MOUTH DAILY. Patient taking differently: Take 0.4 mg by mouth daily.  07/21/17  Yes Viviana Simpler I, MD  triamcinolone cream (KENALOG) 0.1 % Apply 1 application topically 2 (two) times daily as needed. Patient taking differently: Apply 1 application topically 2 (two) times daily as needed (irritation).  09/05/16  Yes [name], MD  vitamin B-12 (CYANOCOBALAMIN) 500 MCG tablet Take 500 mcg by mouth daily.   Yes  [provider]  sodium chloride flush (NS) 0.9 % SOLN 10 mLs by Intracatheter route every 12 (twelve) hours. 01/20/18   Pleas Koch, NP      Medications: . enoxaparin (LOVENOX) injection  40 mg Subcutaneous Q24H  . feeding supplement (ENSURE ENLIVE)  237 mL Oral BID BM  . gabapentin  600 mg Oral BID  . tamsulosin  0.4 mg Oral Daily   . 0.9 % NaCl with KCl 20 mEq / L 50 mL/hr at 01/27/18 3159  . lactated ringers 10 mL/hr at 01/26/18 0756   Anti-infectives (From admission, onward)   Start     Dose/Rate Route Frequency Ordered Stop   01/26/18 0800  ceFAZolin (ANCEF) IVPB 2g/100 mL premix     2 g 200 mL/hr over 30 Minutes Intravenous On call to O.R.  01/26/18 0724 01/26/18 0923   01/26/18 0726  ceFAZolin (ANCEF) 2-4 GM/100ML-% IVPB    Note to Pharmacy:  Henrine Screws   : cabinet override      01/26/18 0726 01/26/18 0913      Assessment/Plan BPH Hypertension Lymphoma Back pain/cervical radiculopathy Hearing loss  Chronic cholecystitis with cholelithiasis/percutaneous IR drain placement 12/28/17,[facility] [place] cholecystectomy, 01/26/2018, DR. Coralie Keens  FEN:  IV fluids/regular diet DVT:  Lovenox ID:  preop Ancef Follow up:  Dr. Ninfa Linden   Plan: The floor staff is working on getting him up now.  We will advance his diet and see how he does.  He lives alone with his bulldog.  Will ask case manager to look into home arrangements and see what else he may need.  If everything goes well will aim to discharge later today.  Staff notes he is very wobbly walking will get OT and PT eval.    LOS: 1 day    Ryley Teater 01/27/2018 [phone]

## 2018-01-27 NOTE — Evaluation (Signed)
Physical Therapy Evaluation Patient Details Name: Jose [name] MRN: [id number] DOB: 05-06-1924 Today's Date: 01/27/2018   History of Present Illness  84 y.o. male admitted on 01/26/18 for chronic cholecystitis with stones, s/p laparoscopic cholecystetomy.  Pt recently was at [facility] for IR drain placement 12/28/17 also due to chronic cholecystitis and cholelithiasis.  Other significant PMH of HOHHTN, cerical radiculopathy, and back problems.  Clinical Impression  Pt was able to get up and ambulate around the unit with [facility] and staggering gait pattern (which daughter in law reports is normal for him). PT recommended pt use a RW for gait at discharge and daughter in law agreed.   PT to follow acutely for deficits listed below.       Follow Up Recommendations Home health PT;Other (comment)(was active PTA with [facility])    Equipment Recommendations  None recommended by PT    Recommendations for Other Services   NA    Precautions / Restrictions Precautions Precautions: Fall Precaution Comments: pt is a bit unsteady on his feet with just the cane Restrictions Weight Bearing Restrictions: No      Mobility  Bed Mobility               General bed mobility comments: Pt was OOB in the recliner chair.   Transfers Overall transfer level: Needs assistance Equipment used: Straight cane Transfers: Sit to/from Stand Sit to Stand: Min guard         General transfer comment: Min guard assist for safety  Ambulation/Gait Ambulation/Gait assistance: Min guard Gait Distance (Feet): 130 Feet Assistive device: Straight cane Gait Pattern/deviations: Step-through pattern;Staggering left;Staggering right     General Gait Details: Pt with staggering gait pattern, he reports he does not fall at home, I advised him and his daughter in law to have him use the RW for a while until he recovers from this procedure.  Daughter in law states he has 3 more home PT sessions when he gets  back.          Balance Overall balance assessment: Needs assistance Sitting-balance support: Feet supported;No upper extremity supported Sitting balance-Leahy Scale: Good     Standing balance support: Single extremity supported Standing balance-Leahy Scale: Fair                               Pertinent Vitals/Pain Pain Assessment: Faces Faces Pain Scale: Hurts little more Pain Location: R LQ Pain Descriptors / Indicators: Aching Pain Intervention(s): Limited activity within patient's tolerance;Monitored during session;Repositioned    Home Living Family/patient expects to be discharged to:: Private residence Living Arrangements: Alone(with his [place] Dog "Adrian Blackwater") Available Help at Discharge: Family Type of Home: House Home Access: Ramped entrance     Home Layout: One level Home Equipment: Cane - single point;Walker - 2 wheels      Prior Function Level of Independence: Needs assistance   Gait / Transfers Assistance Needed: walks with a cane, sometimes a walker, bathes from the sink, is active with HH PT.  Drives  ADL's / Homemaking Assistance Needed: Has a housekeeper, daughter in law cooks some for him, what meals he does cook are microwave.         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              Assessment/Plan    PT Assessment Patient needs continued PT services  PT Problem List Decreased strength;Decreased activity tolerance;Decreased  mobility;Decreased balance;Decreased cognition;Decreased knowledge of use of DME;Pain       PT Treatment Interventions DME instruction;Gait training;Stair training;Functional mobility training;Therapeutic activities;Therapeutic exercise;Balance training;Cognitive remediation;Patient/family education    PT Goals (Current goals can be found in the Care Plan section)  Acute Rehab PT Goals Patient Stated Goal: to go home today PT Goal Formulation: With patient/family Time For Goal Achievement: 02/10/18 Potential to Achieve Goals: Good    Frequency Min 3X/week           AM-PAC PT "6 Clicks" Daily Activity  Outcome Measure Difficulty turning over in bed (including adjusting bedclothes, sheets and blankets)?: A Little Difficulty moving from lying on back to sitting on the side of the bed? : A Little Difficulty sitting down on and standing up from a chair with arms (e.g., wheelchair, bedside commode, etc,.)?: Unable Help needed moving to and from a bed to chair (including a wheelchair)?: A Little Help needed walking in hospital room?: A Little Help needed climbing 3-5 steps with a railing? : A Little 6 Click Score: 16    End of Session Equipment Utilized During Treatment: Gait belt(high to avoid painful abdomen) Activity Tolerance: Patient tolerated treatment well Patient left: in chair;with call bell/phone within reach;with family/visitor present   PT Visit Diagnosis: Muscle weakness (generalized) (M62.81);Difficulty in walking, not elsewhere classified (R26.2);Pain;Unsteadiness on feet (R26.81) Pain - Right/Left: Right Pain - part of body: (abdomen)    Time: 8329-1916 PT Time Calculation (min) (ACUTE ONLY): 21 min   Charges:         Wells Guiles B. Anntoinette Haefele, PT, DPT  Acute Rehabilitation #[phone] pager #[phone] office   PT Evaluation $PT Eval Moderate Complexity: 1 Mod         01/27/2018, 11:24 AM

## 2018-01-28 ENCOUNTER — Telehealth: Payer: Self-pay | Admitting: *Deleted

## 2018-01-28 ENCOUNTER — Telehealth: Payer: Self-pay | Admitting: Internal Medicine

## 2018-01-28 NOTE — Telephone Encounter (Signed)
Copied from [place]. Topic: Quick Communication - See Telephone Encounter >> Jan 28, 2018 12:53 PM Conception Chancy, NT wrote: CRM for notification. See Telephone encounter for: 01/28/18.  Jose [name] is calling from [place] and is needing the verbal orders to resume all services, patient was recently in the hospital.  Cb# [phone]

## 2018-01-28 NOTE — Telephone Encounter (Signed)
That is fine to proceed

## 2018-01-28 NOTE — Telephone Encounter (Signed)
Verbal orders given to [place] at [facility]

## 2018-01-28 NOTE — Telephone Encounter (Signed)
Lm requesting return call to complete TCM and confirm hosp f/u appt  

## 2018-01-29 ENCOUNTER — Telehealth: Payer: Self-pay | Admitting: Internal Medicine

## 2018-01-29 NOTE — Telephone Encounter (Signed)
Copied from [place] [phone]. Topic: Quick Communication - See Telephone Encounter >> Jan 29, 2018 10:38 AM Rutherford Nail, NT wrote: CRM for notification. See Telephone encounter for: 01/29/18. [name] would like to know if the patient is needing to start back taking his iron and potassium supplement? Please advise. CB#: [phone]

## 2018-01-29 NOTE — Telephone Encounter (Signed)
What about iron?

## 2018-01-29 NOTE — Telephone Encounter (Signed)
Oh---the second sentence was supposed to be the iron but I wrote potassium by mistake

## 2018-01-29 NOTE — Telephone Encounter (Signed)
Left detailed message on VM per DPR. 

## 2018-01-29 NOTE — Telephone Encounter (Signed)
Lm requesting return call to complete TCM and confirm hosp f/u appt  

## 2018-01-29 NOTE — Telephone Encounter (Signed)
His he should definitely stop the potassium--that was back to normal in the hospital I would recommend holding off on the potassium till his stomach and bowels are completely back to normal--then he should probably take it for a bout a month

## 2018-01-30 ENCOUNTER — Telehealth: Payer: Self-pay

## 2018-01-30 NOTE — Telephone Encounter (Signed)
[facility] admission and lab results under lab tab for 01/27/18 has RBC 2.65 and hgb 9.00.Please advise.

## 2018-01-30 NOTE — Telephone Encounter (Signed)
Copied from [place] [phone]. Topic: General - Other >> Jan 30, 2018  2:46 PM Janace Aris A wrote: Reason for CRM: Patient's daughter called in to discuss patient's blood results from 01/26/2018. She says she's concern because its lower than what it was when he was actually taking the iron. She feels like the patient should start back taking the iron pills..   Please advise.

## 2018-02-01 NOTE — Telephone Encounter (Signed)
I am not surprised about that result after the surgery It would be fine to start the iron---as long as his bowels are moving well

## 2018-02-02 NOTE — Telephone Encounter (Signed)
Spoke to Clarissa. She said he is moving well so he will start the iron.

## 2018-02-03 ENCOUNTER — Other Ambulatory Visit: Payer: Medicare Other

## 2018-02-09 ENCOUNTER — Telehealth: Payer: Self-pay | Admitting: Internal Medicine

## 2018-02-09 DIAGNOSIS — K811 Chronic cholecystitis: Secondary | ICD-10-CM

## 2018-02-09 NOTE — Telephone Encounter (Signed)
Spoke to Jose [name]. She said she would like to bring him in Tuesday or Wednesday for his labs so the results will be available for his OV on the 26th. Please place orders and send back to someone to make lab appt.

## 2018-02-09 NOTE — Telephone Encounter (Signed)
Orders placed.

## 2018-02-09 NOTE — Telephone Encounter (Signed)
Spoke to pt's daughter-in-law, [name]. I made her aware that Dr Silvio Pate was out of the office until tomorrow. She would like to wait for Dr Silvio Pate to decide and order if appropriate.  She said he has healed well just very tired. He has an appt with [place] already on 9-26. Asking if labs could be done prior to that appt?

## 2018-02-09 NOTE — Telephone Encounter (Signed)
If they want to bring him out a separate time, I can order labs for tomorrow and then see him on the 26th

## 2018-02-09 NOTE — Telephone Encounter (Signed)
Copied from [place] [id number]. Topic: Quick Communication - See Telephone Encounter >> Feb 09, 2018 10:21 AM Rutherford Nail, NT wrote: CRM for notification. See Telephone encounter for: 02/09/18. Patient's wife calling and states that the patient had gallbladder surgery 3 weeks ago and has been complaining of being tired more frequently. Would like to know if Dr Silvio Pate could order a hemaglobin and iron check and the patient possibly be seen tomorrow 02/10/18? Please advise.

## 2018-02-10 ENCOUNTER — Other Ambulatory Visit (INDEPENDENT_AMBULATORY_CARE_PROVIDER_SITE_OTHER): Payer: Medicare Other

## 2018-02-10 ENCOUNTER — Other Ambulatory Visit: Payer: Medicare Other

## 2018-02-10 DIAGNOSIS — K811 Chronic cholecystitis: Secondary | ICD-10-CM

## 2018-02-10 LAB — CBC
HEMATOCRIT: 30.4 % — AB (ref 39.0–52.0)
Hemoglobin: 10.6 g/dL — ABNORMAL LOW (ref 13.0–17.0)
MCHC: 34.7 g/dL (ref 30.0–36.0)
MCV: 100 fl (ref 78.0–100.0)
PLATELETS: 172 10*3/uL (ref 150.0–400.0)
RBC: 3.04 Mil/uL — AB (ref 4.22–5.81)
RDW: 16.7 % — ABNORMAL HIGH (ref 11.5–15.5)
WBC: 5.3 10*3/uL (ref 4.0–10.5)

## 2018-02-10 LAB — HEPATIC FUNCTION PANEL
ALT: 47 U/L (ref 0–53)
AST: 26 U/L (ref 0–37)
Albumin: 3.7 g/dL (ref 3.5–5.2)
Alkaline Phosphatase: 159 U/L — ABNORMAL HIGH (ref 39–117)
BILIRUBIN DIRECT: 0.2 mg/dL (ref 0.0–0.3)
Total Bilirubin: 0.7 mg/dL (ref 0.2–1.2)
Total Protein: 6.5 g/dL (ref 6.0–8.3)

## 2018-02-10 LAB — RENAL FUNCTION PANEL
ALBUMIN: 3.7 g/dL (ref 3.5–5.2)
BUN: 18 mg/dL (ref 6–23)
CHLORIDE: 105 meq/L (ref 96–112)
CO2: 27 meq/L (ref 19–32)
Calcium: 9 mg/dL (ref 8.4–10.5)
Creatinine, Ser: 0.94 mg/dL (ref 0.40–1.50)
GFR: 79.73 mL/min (ref >60.00)
Glucose, Bld: 125 mg/dL — ABNORMAL HIGH (ref 70–99)
PHOSPHORUS: 3.5 mg/dL (ref 2.3–4.6)
Potassium: 4.3 mEq/L (ref 3.5–5.1)
Sodium: 137 mEq/L (ref 135–145)

## 2018-02-10 NOTE — Telephone Encounter (Signed)
Spoke to [place]. Made lab appt for today at 330.

## 2018-02-12 ENCOUNTER — Ambulatory Visit (INDEPENDENT_AMBULATORY_CARE_PROVIDER_SITE_OTHER): Payer: Medicare Other | Admitting: Internal Medicine

## 2018-02-12 ENCOUNTER — Encounter: Payer: Self-pay | Admitting: Internal Medicine

## 2018-02-12 VITALS — BP 100/60 | HR 79 | Temp 97.9°F | Ht 67.0 in | Wt 135.0 lb

## 2018-02-12 DIAGNOSIS — K811 Chronic cholecystitis: Secondary | ICD-10-CM | POA: Diagnosis not present

## 2018-02-12 NOTE — Assessment & Plan Note (Signed)
Reassured him---he is doing great post op and as well as we could hope He still needs to limit activity till he follows up with Dr Ninfa Linden RLS symptoms from ongoing iron--discussed trying vinegar/mustard and iron (MVI)

## 2018-02-12 NOTE — Progress Notes (Signed)
Subjective:    Patient ID: Jose [name], male    DOB: 1923/08/20, 84 y.o.   MRN: [id number]  HPI Here for post op evaluation Had cholecystectomy done and home the next day No sig pain issues Cholecystotomy hole is taking a while to heal and that still hurts  Still feels very weak "I still don't feel like doing nothin" Warms up prepared food and prepares breakfast and lunch Still does all instrumental ADLs Frustrated by not being able to do the outdoor work Does ride his gator around the property  Appetite is okay Weight stable  Current Outpatient Medications on File Prior to Visit  Medication Sig Dispense Refill  . acetaminophen (TYLENOL) 500 MG tablet Take 1,000 mg by mouth every 6 (six) hours as needed.    Marland Kitchen aspirin 81 MG tablet Take 81 mg by mouth daily.      . ferrous sulfate 325 (65 FE) MG EC tablet Take 325 mg by mouth daily with breakfast.    . gabapentin (NEURONTIN) 600 MG tablet TAKE 1 TABLET (600 MG TOTAL) BY MOUTH 2 (TWO) TIMES DAILY. (Patient taking differently: Take 600 mg by mouth 2 (two) times daily. ) 180 tablet 3  . ketoconazole (NIZORAL) 2 % cream Apply 1 application topically 2 (two) times daily as needed for irritation. 60 g 1  . potassium chloride SA (K-DUR,KLOR-CON) 20 MEQ tablet Take 2 tablets (40 mEq total) by mouth daily. (Patient taking differently: Take 20 mEq by mouth 2 (two) times daily. ) 60 tablet 11  . tamsulosin (FLOMAX) 0.4 MG CAPS capsule TAKE 1 CAPSULE (0.4 MG TOTAL) BY MOUTH DAILY. (Patient taking differently: Take 0.4 mg by mouth daily. ) 90 capsule 3  . triamcinolone cream (KENALOG) 0.1 % Apply 1 application topically 2 (two) times daily as needed. (Patient taking differently: Apply 1 application topically 2 (two) times daily as needed (irritation). ) 30 g 1  . vitamin B-12 (CYANOCOBALAMIN) 500 MCG tablet Take 500 mcg by mouth daily.     No current facility-administered medications on file prior to visit.     No Known  Allergies  Past Medical History:  Diagnosis Date  . Anxiety   . Back problem 1960  . BPH (benign prostatic hypertrophy)   . Cervical radiculopathy    with neuropathy  . Dyspnea   . GERD (gastroesophageal reflux disease)   . Hypertension   . Hypokalemia   . Melanoma in situ ([place]) 12/2016   neck    Past Surgical History:  Procedure Laterality Date  . CATARACT EXTRACTION     OD  . CHOLECYSTECTOMY N/A 01/26/2018   Procedure: LAPAROSCOPIC CHOLECYSTECTOMY;  Surgeon: Coralie Keens, MD;  Location: [place];  Service: General;  Laterality: N/A;  . IR PERC CHOLECYSTOSTOMY  12/28/2017  . LAPAROSCOPIC CHOLECYSTECTOMY  01/26/2018  . MELANOMA EXCISION  12/2016   in situ---Dr Dasher  . TONSILLECTOMY      Family History  Problem Relation Age of Onset  . Cancer Son   . Coronary artery disease Neg Hx   . Diabetes Neg Hx     Social History   Socioeconomic History  . Marital status: Widowed    Spouse name: Not on file  . Number of children: 2  . Years of education: Not on file  . Highest education level: Not on file  Occupational History  . Occupation: retiredOccupational psychologist [employer]    Employer: RETIRED  Social Needs  . Financial resource strain: Not on file  . Food insecurity:  Worry: Not on file    Inability: Not on file  . Transportation needs:    Medical: Not on file    Non-medical: Not on file  Tobacco Use  . Smoking status: Never Smoker  . Smokeless tobacco: Never Used  Substance and Sexual Activity  . Alcohol use: No  . Drug use: No  . Sexual activity: Not on file  Lifestyle  . Physical activity:    Days per week: Not on file    Minutes per session: Not on file  . Stress: Not on file  Relationships  . Social connections:    Talks on phone: Patient refused    Gets together: Patient refused    Attends religious service: Patient refused    Active member of club or organization: Patient refused    Attends meetings of clubs or organizations: Patient refused     Relationship status: Patient refused  . Intimate partner violence:    Fear of current or ex partner: Patient refused    Emotionally abused: Patient refused    Physically abused: Patient refused    Forced sexual activity: Patient refused  Other Topics Concern  . Not on file  Social History Narrative   Has living will   Son Mikeal Hawthorne is health care POA.     Requests DNR--done 05/07/16   No feeding tube if cognitively unaware   Review of Systems  Not sleeping well---feet jump and are burning Does take gabapentin at night (though for his neck) Bowels are moving okay--with stool softener     Objective:   Physical Exam  Constitutional: He appears well-developed. No distress.  Neck: No thyromegaly present.  Cardiovascular: Normal rate, regular rhythm and normal heart sounds. Exam reveals no gallop.  No murmur heard. Respiratory: Effort normal and breath sounds normal. No respiratory distress. He has no wheezes. He has no rales.  GI: Soft. He exhibits no distension. There is no tenderness. There is no rebound and no guarding.  Cholecystotomy site is fairly well healed  Lymphadenopathy:    He has no cervical adenopathy.           Assessment & Plan:

## 2018-03-09 ENCOUNTER — Encounter: Payer: Self-pay | Admitting: Gastroenterology

## 2018-03-09 ENCOUNTER — Telehealth: Payer: Self-pay | Admitting: Internal Medicine

## 2018-03-09 NOTE — Telephone Encounter (Signed)
Jose [name], can you call and offer hm an appt this week? You can use a 15 min same day if needed. Thanks

## 2018-03-09 NOTE — Telephone Encounter (Signed)
Copied from [place] [phone]. Topic: Quick Communication - See Telephone Encounter >> Mar 09, 2018 12:14 PM Cecelia Byars, NT wrote: CRM for notification. See Telephone encounter for: 03/09/18.  Patients daughter in law [name] called and said the patient is still complaining about pain in his upper  stomach area , below the sternum ,this has been going on since August he says  and and he has begin to have side pain recently  In the last 2 weeks he says this morning and  they want to know what are the options at this point  ,He is no longer taking his iron tablets , please call her at [phone] or [phone]

## 2018-03-09 NOTE — Telephone Encounter (Signed)
I spoke to Jose [name] and she scheduled appointment on 03/12/18 at 2:00.

## 2018-03-09 NOTE — Telephone Encounter (Signed)
Probably needs to come in to be checked again to try to figure out what is going on

## 2018-03-09 NOTE — Telephone Encounter (Signed)
Pt last seen 02/12/18.

## 2018-03-11 ENCOUNTER — Inpatient Hospital Stay
Admission: EM | Admit: 2018-03-11 | Discharge: 2018-03-18 | DRG: 871 | Disposition: A | Discharge status: Home Health | Payer: Medicare Other | Attending: Internal Medicine | Admitting: Internal Medicine

## 2018-03-11 ENCOUNTER — Emergency Department: Payer: Medicare Other

## 2018-03-11 ENCOUNTER — Encounter: Payer: Self-pay | Admitting: Emergency Medicine

## 2018-03-11 DIAGNOSIS — A4151 Sepsis due to Escherichia coli [E. coli]: Secondary | ICD-10-CM | POA: Diagnosis not present

## 2018-03-11 DIAGNOSIS — D539 Nutritional anemia, unspecified: Secondary | ICD-10-CM | POA: Diagnosis present

## 2018-03-11 DIAGNOSIS — Z681 Body mass index (BMI) 19 or less, adult: Secondary | ICD-10-CM

## 2018-03-11 DIAGNOSIS — E440 Moderate protein-calorie malnutrition: Secondary | ICD-10-CM

## 2018-03-11 DIAGNOSIS — K831 Obstruction of bile duct: Secondary | ICD-10-CM | POA: Diagnosis present

## 2018-03-11 DIAGNOSIS — R17 Unspecified jaundice: Secondary | ICD-10-CM

## 2018-03-11 DIAGNOSIS — A419 Sepsis, unspecified organism: Secondary | ICD-10-CM

## 2018-03-11 DIAGNOSIS — E876 Hypokalemia: Secondary | ICD-10-CM | POA: Diagnosis present

## 2018-03-11 DIAGNOSIS — Z9049 Acquired absence of other specified parts of digestive tract: Secondary | ICD-10-CM

## 2018-03-11 DIAGNOSIS — R7989 Other specified abnormal findings of blood chemistry: Secondary | ICD-10-CM

## 2018-03-11 DIAGNOSIS — I129 Hypertensive chronic kidney disease with stage 1 through stage 4 chronic kidney disease, or unspecified chronic kidney disease: Secondary | ICD-10-CM | POA: Diagnosis present

## 2018-03-11 DIAGNOSIS — N400 Enlarged prostate without lower urinary tract symptoms: Secondary | ICD-10-CM | POA: Diagnosis present

## 2018-03-11 DIAGNOSIS — D6959 Other secondary thrombocytopenia: Secondary | ICD-10-CM | POA: Diagnosis present

## 2018-03-11 DIAGNOSIS — K859 Acute pancreatitis without necrosis or infection, unspecified: Secondary | ICD-10-CM | POA: Diagnosis not present

## 2018-03-11 DIAGNOSIS — N189 Chronic kidney disease, unspecified: Secondary | ICD-10-CM | POA: Diagnosis present

## 2018-03-11 DIAGNOSIS — N179 Acute kidney failure, unspecified: Secondary | ICD-10-CM | POA: Diagnosis present

## 2018-03-11 DIAGNOSIS — E860 Dehydration: Secondary | ICD-10-CM | POA: Diagnosis present

## 2018-03-11 DIAGNOSIS — Z86006 Personal history of melanoma in-situ: Secondary | ICD-10-CM

## 2018-03-11 DIAGNOSIS — K8309 Other cholangitis: Secondary | ICD-10-CM | POA: Diagnosis present

## 2018-03-11 DIAGNOSIS — Z85820 Personal history of malignant melanoma of skin: Secondary | ICD-10-CM

## 2018-03-11 DIAGNOSIS — R945 Abnormal results of liver function studies: Secondary | ICD-10-CM

## 2018-03-11 DIAGNOSIS — Z7982 Long term (current) use of aspirin: Secondary | ICD-10-CM

## 2018-03-11 DIAGNOSIS — Z79899 Other long term (current) drug therapy: Secondary | ICD-10-CM

## 2018-03-11 DIAGNOSIS — H9190 Unspecified hearing loss, unspecified ear: Secondary | ICD-10-CM | POA: Diagnosis present

## 2018-03-11 DIAGNOSIS — K219 Gastro-esophageal reflux disease without esophagitis: Secondary | ICD-10-CM | POA: Diagnosis present

## 2018-03-11 DIAGNOSIS — Z7952 Long term (current) use of systemic steroids: Secondary | ICD-10-CM

## 2018-03-11 DIAGNOSIS — D638 Anemia in other chronic diseases classified elsewhere: Secondary | ICD-10-CM | POA: Diagnosis present

## 2018-03-11 LAB — URINALYSIS, COMPLETE (UACMP) WITH MICROSCOPIC
GLUCOSE, UA: NEGATIVE mg/dL
HGB URINE DIPSTICK: NEGATIVE
KETONES UR: NEGATIVE mg/dL
Leukocytes, UA: NEGATIVE
Nitrite: NEGATIVE
Specific Gravity, Urine: 1.02 (ref 1.005–1.030)
pH: 5.5 (ref 5.0–8.0)

## 2018-03-11 LAB — BASIC METABOLIC PANEL
ANION GAP: 11 (ref 5–15)
BUN: 19 mg/dL (ref 8–23)
CHLORIDE: 104 mmol/L (ref 98–111)
CO2: 23 mmol/L (ref 22–32)
Calcium: 8.9 mg/dL (ref 8.9–10.3)
Creatinine, Ser: 1.39 mg/dL — ABNORMAL HIGH (ref 0.61–1.24)
GFR calc Af Amer: 49 mL/min — ABNORMAL LOW (ref >60)
GFR calc non Af Amer: 42 mL/min — ABNORMAL LOW (ref >60)
GLUCOSE: 159 mg/dL — AB (ref 70–99)
POTASSIUM: 3.4 mmol/L — AB (ref 3.5–5.1)
Sodium: 138 mmol/L (ref 135–145)

## 2018-03-11 LAB — CBC
HEMATOCRIT: 32 % — AB (ref 39.0–52.0)
HEMOGLOBIN: 10.7 g/dL — AB (ref 13.0–17.0)
MCH: 33.5 pg (ref 26.0–34.0)
MCHC: 33.4 g/dL (ref 30.0–36.0)
MCV: 100.3 fL — AB (ref 80.0–100.0)
Platelets: 128 10*3/uL — ABNORMAL LOW (ref 150–400)
RBC: 3.19 MIL/uL — AB (ref 4.22–5.81)
RDW: 14.1 % (ref 11.5–15.5)
WBC: 10.8 10*3/uL — ABNORMAL HIGH (ref 4.0–10.5)
nRBC: 0 % (ref 0.0–0.2)

## 2018-03-11 LAB — LACTIC ACID, PLASMA
Lactic Acid, Venous: 3.5 mmol/L (ref 0.5–1.9)
Lactic Acid, Venous: 1.4 mmol/L (ref 0.5–1.9)

## 2018-03-11 LAB — TROPONIN I: Troponin I: 0.08 ng/mL (ref <0.03)

## 2018-03-11 LAB — LACTATE DEHYDROGENASE: LDH: 244 U/L — ABNORMAL HIGH (ref 98–192)

## 2018-03-11 MED ORDER — VANCOMYCIN HCL IN DEXTROSE 1-5 GM/200ML-% IV SOLN
1000.0000 mg | Freq: Once | INTRAVENOUS | Status: AC
  Administered 2018-03-11: 1000 mg via INTRAVENOUS
  Filled 2018-03-11: qty 200

## 2018-03-11 MED ORDER — SODIUM CHLORIDE 0.9 % IV BOLUS
500.0000 mL | Freq: Once | INTRAVENOUS | Status: AC
  Administered 2018-03-11: 500 mL via INTRAVENOUS

## 2018-03-11 MED ORDER — IOHEXOL 300 MG/ML  SOLN
75.0000 mL | Freq: Once | INTRAMUSCULAR | Status: AC | PRN
  Administered 2018-03-11: 75 mL via INTRAVENOUS

## 2018-03-11 MED ORDER — ONDANSETRON HCL 4 MG/2ML IJ SOLN
INTRAMUSCULAR | Status: AC
  Administered 2018-03-11: 4 mg via INTRAVENOUS
  Filled 2018-03-11: qty 2

## 2018-03-11 MED ORDER — ONDANSETRON HCL 4 MG/2ML IJ SOLN
4.0000 mg | Freq: Once | INTRAMUSCULAR | Status: AC
  Administered 2018-03-11: 4 mg via INTRAVENOUS

## 2018-03-11 MED ORDER — SODIUM CHLORIDE 0.9 % IV SOLN
2.0000 g | Freq: Once | INTRAVENOUS | Status: AC
  Administered 2018-03-11: 2 g via INTRAVENOUS
  Filled 2018-03-11: qty 2

## 2018-03-11 MED ORDER — SODIUM CHLORIDE 0.9 % IV BOLUS
1000.0000 mL | Freq: Once | INTRAVENOUS | Status: AC
  Administered 2018-03-11: 1000 mL via INTRAVENOUS

## 2018-03-11 MED ORDER — METRONIDAZOLE IN NACL 5-0.79 MG/ML-% IV SOLN
500.0000 mg | Freq: Three times a day (TID) | INTRAVENOUS | Status: DC
  Administered 2018-03-12 – 2018-03-13 (×5): 500 mg via INTRAVENOUS
  Filled 2018-03-11 (×7): qty 100

## 2018-03-11 NOTE — ED Notes (Signed)
Cultures drawn at 2111 with wait for antibiotic order.

## 2018-03-11 NOTE — ED Provider Notes (Signed)
[facility] Emergency Department Provider Note ____________________________________________   First MD Initiated Contact with Patient 03/11/18 2118     (approximate)  I have reviewed the triage vital signs and the nursing notes.   HISTORY  Chief Complaint Weakness    HPI Jose [name] is a 84 y.o. male with PMH as noted below who presents with generalized weakness, gradual onset over the last several days but acutely worsened this afternoon, associated with nausea and abdominal discomfort, but not associated with fever or vomiting.  The patient has had decreased oral intake over the last several weeks since a cholecystectomy.  He denies chest pain or difficulty breathing, and denies diarrhea or urinary symptoms.  Past Medical History:  Diagnosis Date  . Anxiety   . Back problem 1960  . BPH (benign prostatic hypertrophy)   . Cervical radiculopathy    with neuropathy  . Dyspnea   . GERD (gastroesophageal reflux disease)   . Hypertension   . Hypokalemia   . Melanoma in situ [facility]) 12/2016   neck    Patient Active Problem List   Diagnosis Date Noted  . Cholecystitis 01/26/2018  . Malnutrition of mild degree ([place]) 01/06/2018  . Elevated liver enzymes 01/04/2018  . Chronic cholecystitis 12/28/2017  . Cholecystitis, acute with cholelithiasis 12/27/2017  . Melanoma of neck ([place])   . Rash 09/05/2016  . Neuropathy 05/07/2016  . Anemia 05/11/2015  . Preventative health care 04/29/2014  . Advanced directives, counseling/discussion 04/29/2014  . Osteoarthrosis involving multiple sites 10/31/2010  . Osteoarthrosis of knee 10/31/2010  . ACTINIC KERATOSIS 12/04/2007  . BPH with obstruction/lower urinary tract symptoms 10/01/2006  . Essential hypertension, benign 09/17/2006  . GERD 09/17/2006  . Cervical radiculopathy 09/17/2006    Past Surgical History:  Procedure Laterality Date  . CATARACT EXTRACTION     OD  . CHOLECYSTECTOMY N/A 01/26/2018     Procedure: LAPAROSCOPIC CHOLECYSTECTOMY;  Surgeon: Coralie Keens, MD;  Location: [facility];  Service: General;  Laterality: N/A;  . IR PERC CHOLECYSTOSTOMY  12/28/2017  . LAPAROSCOPIC CHOLECYSTECTOMY  01/26/2018  . MELANOMA EXCISION  12/2016   in situ---Dr Dasher  . TONSILLECTOMY      Prior to Admission medications   Medication Sig Start Date End Date Taking? Authorizing Provider  acetaminophen (TYLENOL) 500 MG tablet Take 1,000 mg by mouth every 6 (six) hours as needed.   Yes [provider]  aspirin 81 MG tablet Take 81 mg by mouth daily.     Yes [provider]  gabapentin (NEURONTIN) 600 MG tablet TAKE 1 TABLET (600 MG TOTAL) BY MOUTH 2 (TWO) TIMES DAILY. Patient taking differently: Take 600 mg by mouth 2 (two) times daily.  07/21/17  Yes [name], MD  ketoconazole (NIZORAL) 2 % cream Apply 1 application topically 2 (two) times daily as needed for irritation. 05/07/17  Yes [name], MD  potassium chloride SA (K-DUR,KLOR-CON) 20 MEQ tablet Take 2 tablets (40 mEq total) by mouth daily. Patient taking differently: Take 40 mEq by mouth 2 (two) times daily.  01/06/18  Yes Viviana Simpler I, MD  tamsulosin (FLOMAX) 0.4 MG CAPS capsule TAKE 1 CAPSULE (0.4 MG TOTAL) BY MOUTH DAILY. Patient taking differently: Take 0.4 mg by mouth daily.  07/21/17  Yes Viviana Simpler I, MD  triamcinolone cream (KENALOG) 0.1 % Apply 1 application topically 2 (two) times daily as needed. Patient taking differently: Apply 1 application topically 2 (two) times daily as needed (irritation).  09/05/16  Yes [place],  Theophilus Kinds, MD  vitamin B-12 (CYANOCOBALAMIN) 500 MCG tablet Take 500 mcg by mouth daily.   Yes [provider]  ferrous sulfate 325 (65 FE) MG EC tablet Take 325 mg by mouth daily with breakfast.    [provider]    Allergies Patient has no known allergies.  Family History  Problem Relation Age of Onset  . Cancer Son   . Coronary artery disease Neg Hx    . Diabetes Neg Hx     Social History Social History   Tobacco Use  . Smoking status: Never Smoker  . Smokeless tobacco: Never Used  Substance Use Topics  . Alcohol use: No  . Drug use: No    Review of Systems  Constitutional: No fever.  Positive for weakness. Eyes: No visual changes. ENT: No sore throat. Cardiovascular: Denies chest pain. Respiratory: Denies shortness of breath. Gastrointestinal: Positive for nausea.  No vomiting or diarrhea..  Genitourinary: Negative for dysuria.  Musculoskeletal: Negative for back pain. Skin: Negative for rash. Neurological: Negative for headache.   ____________________________________________   PHYSICAL EXAM:  VITAL SIGNS: ED Triage Vitals  Enc Vitals Group     BP 03/11/18 2109 (!) 88/47     Pulse Rate 03/11/18 2130 94     Resp 03/11/18 2109 (!) 24     Temp 03/11/18 2200 100 F (37.8 C)     Temp Source 03/11/18 2200 Rectal     SpO2 03/11/18 2130 95 %     Weight --      Height --      Head Circumference --      Peak Flow --      Pain Score --      Pain Loc --      Pain Edu? --      Excl. in [place]? --     Constitutional: Alert and oriented.  Weak appearing but in no acute distress. Eyes: Conjunctivae are normal.  No scleral icterus. Head: Atraumatic. Nose: No congestion/rhinnorhea. Mouth/Throat: Mucous membranes are dry.   Neck: Normal range of motion.  Cardiovascular: Normal rate, regular rhythm. Grossly normal heart sounds.  Good peripheral circulation. Respiratory: Normal respiratory effort.  No retractions. Lungs CTAB. Gastrointestinal: Soft with mild epigastric discomfort but no focal tenderness.  No distention.  Genitourinary: No flank tenderness. Musculoskeletal: No lower extremity edema.  Extremities warm and well perfused.  Neurologic:  Normal speech and language. No gross focal neurologic deficits are appreciated.  Skin:  Skin is warm and dry. No rash noted. Psychiatric: Mood and affect are normal. Speech  and behavior are normal.  ____________________________________________   LABS (all labs ordered are listed, but only abnormal results are displayed)  Labs Reviewed  BASIC METABOLIC PANEL - Abnormal; Notable for the following components:      Result Value   Potassium 3.4 (*)    Glucose, Bld 159 (*)    Creatinine, Ser 1.39 (*)    GFR calc non Af Amer 42 (*)    GFR calc Af Amer 49 (*)    All other components within normal limits  CBC - Abnormal; Notable for the following components:   WBC 10.8 (*)    RBC 3.19 (*)    Hemoglobin 10.7 (*)    HCT 32.0 (*)    MCV 100.3 (*)    Platelets 128 (*)    All other components within normal limits  URINALYSIS, COMPLETE (UACMP) WITH MICROSCOPIC - Abnormal; Notable for the following components:   Color, Urine AMBER (*)  Bilirubin Urine MODERATE (*)    Protein, ur TRACE (*)    Bacteria, UA RARE (*)    All other components within normal limits  TROPONIN I - Abnormal; Notable for the following components:   Troponin I 0.08 (*)    All other components within normal limits  LACTIC ACID, PLASMA - Abnormal; Notable for the following components:   Lactic Acid, Venous 3.5 (*)    All other components within normal limits  HEPATIC FUNCTION PANEL - Abnormal; Notable for the following components:   Albumin 3.1 (*)    AST 268 (*)    ALT 173 (*)    Alkaline Phosphatase 796 (*)    Total Bilirubin 4.2 (*)    Bilirubin, Direct 2.4 (*)    Indirect Bilirubin 1.8 (*)    All other components within normal limits  LACTATE DEHYDROGENASE - Abnormal; Notable for the following components:   LDH 244 (*)    All other components within normal limits  CULTURE, BLOOD (ROUTINE X 2)  CULTURE, BLOOD (ROUTINE X 2)  LACTIC ACID, PLASMA  LIPASE, BLOOD  CBG MONITORING, ED   ____________________________________________  EKG  ED ECG REPORT I, Arta Silence, the attending physician, personally viewed and interpreted this ECG.  Date: 03/11/2018 EKG Time:  2109 Rate: 98 Rhythm: normal sinus rhythm QRS Axis: normal Intervals: normal ST/T Wave abnormalities: normal Narrative Interpretation: no evidence of acute ischemia  ____________________________________________  RADIOLOGY  CXR: No focal infiltrate or other acute abnormality CT abdomen: Indistinct appearance and stranding around pancreas consistent with pancreatitis  ____________________________________________   PROCEDURES  Procedure(s) performed: No  Procedures  Critical Care performed: Yes  CRITICAL CARE Performed by: Arta Silence   Total critical care time: 40 minutes  Critical care time was exclusive of separately billable procedures and treating other patients.  Critical care was necessary to treat or prevent imminent or life-threatening deterioration.  Critical care was time spent personally by me on the following activities: development of treatment plan with patient and/or surrogate as well as nursing, discussions with consultants, evaluation of patient's response to treatment, examination of patient, obtaining history from patient or surrogate, ordering and performing treatments and interventions, ordering and review of laboratory studies, ordering and review of radiographic studies, pulse oximetry and re-evaluation of patient's condition. ____________________________________________   INITIAL IMPRESSION / ASSESSMENT AND PLAN / ED COURSE  Pertinent labs & imaging results that were available during my care of the patient were reviewed by me and considered in my medical decision making (see chart for details).  84 year old male with PMH as noted above and status post cholecystectomy last month presents with generalized weakness which is developed over at least the last several days but acutely worsened this afternoon, associated with nausea, intermittent abdominal discomfort and decreased p.o. intake, but not associated with fever or other focal  symptoms.  I reviewed the past medical records in Epic and confirmed the history of laparoscopic cholecystectomy on 01/26/2018.  The patient has had no ED visits or admissions since that time.  On exam he is weak appearing.  His blood pressure is somewhat low and his heart rate is borderline.  The other vital signs are normal.  The remainder of the exam is as described above.  His mucous membranes are dry.  His abdomen is soft with mild epigastric discomfort but no focal tenderness.  Differential includes most likely sepsis including UTI or pneumonia, versus intra-abdominal source.  I am also considering dehydration, renal insufficiency, other metabolic etiology, or less likely  cardiac cause.  We will give fluids and obtain sepsis work-up.  If UA and chest x-ray negative I will proceed with CT abdomen.  ----------------------------------------- 12:22 AM on 03/12/2018 -----------------------------------------  UA and chest x-ray did not reveal obvious source of infection.  CT abdomen was obtained and shows evidence of pancreatitis.  I added on LFTs and a lipase, as well as a right upper quadrant ultrasound.  Broad-spectrum antibiotics have been initiated.  The patient is stable for admission at this time.  I signed the patient out to the hospitalist Dr. Duane Boston.   ____________________________________________   FINAL CLINICAL IMPRESSION(S) / ED DIAGNOSES  Final diagnoses:  Sepsis, due to unspecified organism, unspecified whether acute organ dysfunction present ([place])  Acute pancreatitis, unspecified complication status, unspecified pancreatitis type  Pancreatitis      NEW MEDICATIONS STARTED DURING THIS VISIT:  New Prescriptions   No medications on file     Note:  This document was prepared using Dragon voice recognition software and may include unintentional dictation errors.    Arta Silence, MD 03/12/18 [phone]

## 2018-03-11 NOTE — ED Notes (Signed)
[facility]) [phone] [place]) [phone]

## 2018-03-11 NOTE — ED Triage Notes (Signed)
Pt arrived via EMS from home with c/o increased weakness x1 week. Pt reports decrease in oral intake. Pt able to ambulate with assistance.

## 2018-03-12 ENCOUNTER — Ambulatory Visit: Payer: Medicare Other | Admitting: Internal Medicine

## 2018-03-12 ENCOUNTER — Inpatient Hospital Stay: Payer: Medicare Other

## 2018-03-12 ENCOUNTER — Emergency Department: Payer: Medicare Other

## 2018-03-12 ENCOUNTER — Encounter: Payer: Self-pay | Admitting: Internal Medicine

## 2018-03-12 ENCOUNTER — Telehealth: Payer: Self-pay

## 2018-03-12 ENCOUNTER — Other Ambulatory Visit: Payer: Self-pay

## 2018-03-12 DIAGNOSIS — D6959 Other secondary thrombocytopenia: Secondary | ICD-10-CM | POA: Diagnosis present

## 2018-03-12 DIAGNOSIS — K8309 Other cholangitis: Secondary | ICD-10-CM | POA: Diagnosis not present

## 2018-03-12 DIAGNOSIS — E860 Dehydration: Secondary | ICD-10-CM | POA: Diagnosis present

## 2018-03-12 DIAGNOSIS — E876 Hypokalemia: Secondary | ICD-10-CM | POA: Diagnosis present

## 2018-03-12 DIAGNOSIS — Z85820 Personal history of malignant melanoma of skin: Secondary | ICD-10-CM | POA: Diagnosis not present

## 2018-03-12 DIAGNOSIS — Z7952 Long term (current) use of systemic steroids: Secondary | ICD-10-CM | POA: Diagnosis not present

## 2018-03-12 DIAGNOSIS — Z7982 Long term (current) use of aspirin: Secondary | ICD-10-CM | POA: Diagnosis not present

## 2018-03-12 DIAGNOSIS — I129 Hypertensive chronic kidney disease with stage 1 through stage 4 chronic kidney disease, or unspecified chronic kidney disease: Secondary | ICD-10-CM | POA: Diagnosis present

## 2018-03-12 DIAGNOSIS — K219 Gastro-esophageal reflux disease without esophagitis: Secondary | ICD-10-CM | POA: Diagnosis present

## 2018-03-12 DIAGNOSIS — D638 Anemia in other chronic diseases classified elsewhere: Secondary | ICD-10-CM | POA: Diagnosis present

## 2018-03-12 DIAGNOSIS — Z79899 Other long term (current) drug therapy: Secondary | ICD-10-CM | POA: Diagnosis not present

## 2018-03-12 DIAGNOSIS — N400 Enlarged prostate without lower urinary tract symptoms: Secondary | ICD-10-CM | POA: Diagnosis present

## 2018-03-12 DIAGNOSIS — K831 Obstruction of bile duct: Secondary | ICD-10-CM | POA: Diagnosis present

## 2018-03-12 DIAGNOSIS — Z86006 Personal history of melanoma in-situ: Secondary | ICD-10-CM | POA: Diagnosis not present

## 2018-03-12 DIAGNOSIS — Z9049 Acquired absence of other specified parts of digestive tract: Secondary | ICD-10-CM | POA: Diagnosis not present

## 2018-03-12 DIAGNOSIS — N189 Chronic kidney disease, unspecified: Secondary | ICD-10-CM | POA: Diagnosis present

## 2018-03-12 DIAGNOSIS — R945 Abnormal results of liver function studies: Secondary | ICD-10-CM | POA: Diagnosis not present

## 2018-03-12 DIAGNOSIS — R17 Unspecified jaundice: Secondary | ICD-10-CM | POA: Diagnosis not present

## 2018-03-12 DIAGNOSIS — K859 Acute pancreatitis without necrosis or infection, unspecified: Secondary | ICD-10-CM | POA: Diagnosis present

## 2018-03-12 DIAGNOSIS — A4151 Sepsis due to Escherichia coli [E. coli]: Secondary | ICD-10-CM | POA: Diagnosis present

## 2018-03-12 DIAGNOSIS — D539 Nutritional anemia, unspecified: Secondary | ICD-10-CM | POA: Diagnosis present

## 2018-03-12 DIAGNOSIS — N179 Acute kidney failure, unspecified: Secondary | ICD-10-CM | POA: Diagnosis present

## 2018-03-12 DIAGNOSIS — E440 Moderate protein-calorie malnutrition: Secondary | ICD-10-CM | POA: Diagnosis present

## 2018-03-12 DIAGNOSIS — Z681 Body mass index (BMI) 19 or less, adult: Secondary | ICD-10-CM | POA: Diagnosis not present

## 2018-03-12 DIAGNOSIS — K851 Biliary acute pancreatitis without necrosis or infection: Secondary | ICD-10-CM | POA: Diagnosis not present

## 2018-03-12 DIAGNOSIS — H9190 Unspecified hearing loss, unspecified ear: Secondary | ICD-10-CM | POA: Diagnosis present

## 2018-03-12 LAB — BLOOD CULTURE ID PANEL (REFLEXED)
Acinetobacter baumannii: NOT DETECTED
CANDIDA KRUSEI: NOT DETECTED
CARBAPENEM RESISTANCE: NOT DETECTED
Candida albicans: NOT DETECTED
Candida glabrata: NOT DETECTED
Candida parapsilosis: NOT DETECTED
Candida tropicalis: NOT DETECTED
ENTEROBACTERIACEAE SPECIES: DETECTED — AB
Enterobacter cloacae complex: NOT DETECTED
Enterococcus species: NOT DETECTED
Escherichia coli: DETECTED — AB
HAEMOPHILUS INFLUENZAE: NOT DETECTED
KLEBSIELLA OXYTOCA: NOT DETECTED
Klebsiella pneumoniae: NOT DETECTED
Listeria monocytogenes: NOT DETECTED
Neisseria meningitidis: NOT DETECTED
Proteus species: NOT DETECTED
Pseudomonas aeruginosa: NOT DETECTED
SERRATIA MARCESCENS: NOT DETECTED
STAPHYLOCOCCUS AUREUS BCID: NOT DETECTED
STAPHYLOCOCCUS SPECIES: NOT DETECTED
STREPTOCOCCUS PNEUMONIAE: NOT DETECTED
STREPTOCOCCUS SPECIES: NOT DETECTED
Streptococcus agalactiae: NOT DETECTED
Streptococcus pyogenes: NOT DETECTED

## 2018-03-12 LAB — PHOSPHORUS: PHOSPHORUS: 4 mg/dL (ref 2.5–4.6)

## 2018-03-12 LAB — HEPATIC FUNCTION PANEL
ALBUMIN: 3.1 g/dL — AB (ref 3.5–5.0)
ALT: 173 U/L — ABNORMAL HIGH (ref 0–44)
AST: 268 U/L — ABNORMAL HIGH (ref 15–41)
Alkaline Phosphatase: 796 U/L — ABNORMAL HIGH (ref 38–126)
BILIRUBIN DIRECT: 2.4 mg/dL — AB (ref 0.0–0.2)
BILIRUBIN TOTAL: 4.2 mg/dL — AB (ref 0.3–1.2)
Indirect Bilirubin: 1.8 mg/dL — ABNORMAL HIGH (ref 0.3–0.9)
Total Protein: 6.6 g/dL (ref 6.5–8.1)

## 2018-03-12 LAB — FOLATE: Folate: 20.5 ng/mL (ref >5.9)

## 2018-03-12 LAB — LIPASE, BLOOD: LIPASE: 29 U/L (ref 11–51)

## 2018-03-12 LAB — TROPONIN I: TROPONIN I: 0.04 ng/mL — AB (ref <0.03)

## 2018-03-12 LAB — MAGNESIUM: Magnesium: 2 mg/dL (ref 1.7–2.4)

## 2018-03-12 LAB — PROCALCITONIN: Procalcitonin: 16.2 ng/mL

## 2018-03-12 LAB — VITAMIN B12: Vitamin B-12: 1358 pg/mL — ABNORMAL HIGH (ref 180–914)

## 2018-03-12 MED ORDER — MORPHINE SULFATE (PF) 2 MG/ML IV SOLN
2.0000 mg | INTRAVENOUS | Status: DC | PRN
  Administered 2018-03-13: 2 mg via INTRAVENOUS
  Filled 2018-03-12: qty 1

## 2018-03-12 MED ORDER — SODIUM CHLORIDE 0.9 % IV SOLN
1.0000 g | INTRAVENOUS | Status: DC
  Filled 2018-03-12: qty 1

## 2018-03-12 MED ORDER — GABAPENTIN 600 MG PO TABS
600.0000 mg | ORAL_TABLET | Freq: Two times a day (BID) | ORAL | Status: DC
  Administered 2018-03-12 – 2018-03-18 (×13): 600 mg via ORAL
  Filled 2018-03-12 (×13): qty 1

## 2018-03-12 MED ORDER — ENSURE ENLIVE PO LIQD
237.0000 mL | Freq: Two times a day (BID) | ORAL | Status: DC
  Administered 2018-03-12 – 2018-03-18 (×7): 237 mL via ORAL

## 2018-03-12 MED ORDER — ADULT MULTIVITAMIN W/MINERALS CH
1.0000 | ORAL_TABLET | Freq: Every day | ORAL | Status: DC
  Administered 2018-03-12 – 2018-03-18 (×7): 1 via ORAL
  Filled 2018-03-12 (×7): qty 1

## 2018-03-12 MED ORDER — ENOXAPARIN SODIUM 40 MG/0.4ML ~~LOC~~ SOLN: 40.0000 mg | SUBCUTANEOUS | Status: DC

## 2018-03-12 MED ORDER — HEPARIN SODIUM (PORCINE) 5000 UNIT/ML IJ SOLN
5000.0000 [IU] | Freq: Three times a day (TID) | INTRAMUSCULAR | Status: DC
  Administered 2018-03-12 – 2018-03-15 (×8): 5000 [IU] via SUBCUTANEOUS
  Filled 2018-03-12 (×8): qty 1

## 2018-03-12 MED ORDER — GABAPENTIN 600 MG PO TABS: 600.0000 mg | ORAL_TABLET | Freq: Two times a day (BID) | ORAL | Status: DC

## 2018-03-12 MED ORDER — VANCOMYCIN HCL IN DEXTROSE 750-5 MG/150ML-% IV SOLN
750.0000 mg | INTRAVENOUS | Status: DC
  Filled 2018-03-12: qty 150

## 2018-03-12 MED ORDER — SODIUM CHLORIDE 0.9 % IV SOLN
1.0000 g | Freq: Two times a day (BID) | INTRAVENOUS | Status: DC
  Administered 2018-03-12 – 2018-03-16 (×9): 1 g via INTRAVENOUS
  Filled 2018-03-12 (×11): qty 1

## 2018-03-12 MED ORDER — ACETAMINOPHEN 325 MG PO TABS
325.0000 mg | ORAL_TABLET | Freq: Four times a day (QID) | ORAL | Status: DC | PRN
  Administered 2018-03-12 – 2018-03-18 (×6): 325 mg via ORAL
  Filled 2018-03-12 (×7): qty 1

## 2018-03-12 MED ORDER — POTASSIUM CHLORIDE 10 MEQ/100ML IV SOLN
10.0000 meq | INTRAVENOUS | Status: AC
  Administered 2018-03-12 (×4): 10 meq via INTRAVENOUS
  Filled 2018-03-12 (×4): qty 100

## 2018-03-12 MED ORDER — ASPIRIN EC 81 MG PO TBEC
81.0000 mg | DELAYED_RELEASE_TABLET | Freq: Every day | ORAL | Status: DC
  Administered 2018-03-12 – 2018-03-18 (×7): 81 mg via ORAL
  Filled 2018-03-12 (×7): qty 1

## 2018-03-12 MED ORDER — FERROUS SULFATE 325 (65 FE) MG PO TABS
325.0000 mg | ORAL_TABLET | Freq: Every day | ORAL | Status: DC
  Administered 2018-03-12 – 2018-03-18 (×7): 325 mg via ORAL
  Filled 2018-03-12 (×7): qty 1

## 2018-03-12 MED ORDER — MIDODRINE HCL 5 MG PO TABS
5.0000 mg | ORAL_TABLET | Freq: Three times a day (TID) | ORAL | Status: DC
  Administered 2018-03-12 – 2018-03-13 (×2): 5 mg via ORAL
  Filled 2018-03-12 (×2): qty 1

## 2018-03-12 MED ORDER — VITAMIN B-12 1000 MCG PO TABS
500.0000 ug | ORAL_TABLET | Freq: Every day | ORAL | Status: DC
  Administered 2018-03-12 – 2018-03-18 (×7): 500 ug via ORAL
  Filled 2018-03-12 (×7): qty 1

## 2018-03-12 MED ORDER — ONDANSETRON HCL 4 MG/2ML IJ SOLN: 4.0000 mg | Freq: Four times a day (QID) | INTRAMUSCULAR | Status: DC | PRN

## 2018-03-12 MED ORDER — ACETAMINOPHEN 650 MG RE SUPP
325.0000 mg | Freq: Four times a day (QID) | RECTAL | Status: DC | PRN
  Filled 2018-03-12: qty 1

## 2018-03-12 MED ORDER — LACTATED RINGERS IV SOLN
INTRAVENOUS | Status: DC
  Administered 2018-03-12 – 2018-03-14 (×6): via INTRAVENOUS

## 2018-03-12 MED ORDER — SENNOSIDES-DOCUSATE SODIUM 8.6-50 MG PO TABS: 1.0000 | ORAL_TABLET | Freq: Every evening | ORAL | Status: DC | PRN

## 2018-03-12 MED ORDER — BISACODYL 5 MG PO TBEC: 5.0000 mg | DELAYED_RELEASE_TABLET | Freq: Every day | ORAL | Status: DC | PRN

## 2018-03-12 MED ORDER — TAMSULOSIN HCL 0.4 MG PO CAPS
0.4000 mg | ORAL_CAPSULE | Freq: Every day | ORAL | Status: DC
  Administered 2018-03-12 – 2018-03-17 (×5): 0.4 mg via ORAL
  Filled 2018-03-12 (×6): qty 1

## 2018-03-12 MED ORDER — ONDANSETRON HCL 4 MG PO TABS: 4.0000 mg | ORAL_TABLET | Freq: Four times a day (QID) | ORAL | Status: DC | PRN

## 2018-03-12 NOTE — Telephone Encounter (Signed)
[name] is his daughter-in-law. He fell and they were unable to get him up and had to call EMS.   He had to cancel his appt today for his abdominal pain and weakness. She will advise her husband (his son) to speak to the hospitalist about his ongoing abdominal pain while he is in the hospital.

## 2018-03-12 NOTE — Progress Notes (Signed)
Pharmacy Antibiotic Note  [name] is a 84 y.o. male admitted on 03/11/2018 with pneumonia.  Pharmacy has been consulted for vanc/cefepime dosing. Patient received vanc 1g and cefepime 2g IV x 1  Plan: Will continue vanc 750 mg IV q24h   Will draw trough 10/27 @ 1700 prior to 4th dose. Will continue cefepime 1g IV q24h   Ke 0.0270 T1/2 ~ 24 hrs Goal trough 15 - 20 mcg/mL  Weight: 125 lb 6.4 oz (56.9 kg)  Temp (24hrs), Avg:100 F (37.8 C), Min:100 F (37.8 C), Max:100 F (37.8 C)  Recent Labs  Lab 03/11/18 2112 03/11/18 2318  WBC 10.8*  --   CREATININE 1.39*  --   LATICACIDVEN 3.5* 1.4    Estimated Creatinine Clearance: 27.3 mL/min (A) (by C-G formula based on SCr of 1.39 mg/dL (H)).    No Known Allergies  Thank you for allowing pharmacy to be a part of this patient's care.  Tobie Lords, PharmD, BCPS Clinical Pharmacist 03/12/2018

## 2018-03-12 NOTE — Progress Notes (Signed)
Advanced care plan.  Purpose of the Encounter: CODE STATUS  Parties in Attendance: Patient himself Patient's Decision Capacity: Intact  Subjective/Patient's story: Patient 84 year old with abdominal pain and sepsis with history of hypertension GERD and BPH presenting with abdominal pain and sepsis   Objective/Medical story  Discussed with the patient regarding his desires for cardiac and pulmonary resuscitation  Goals of care determination:   Patient states that he would like everything be done  CODE STATUS: Full code   Time spent discussing advanced care planning: 16 minutes

## 2018-03-12 NOTE — Progress Notes (Addendum)
Initial Nutrition Assessment  DOCUMENTATION CODES:   Non-severe (moderate) malnutrition in context of social or environmental circumstances  INTERVENTION:   Ensure Enlive po BID, each supplement provides 350 kcal and 20 grams of protein  MVI daily  Dysphagia 3 diet   NUTRITION DIAGNOSIS:   Moderate Malnutrition related to social / environmental circumstances(advanced age) as evidenced by moderate to severe fat depletions, moderate to severe muscle depletions, 7 percent weight loss in 6 weeks.  GOAL:   Patient will meet greater than or equal to 90% of their needs  MONITOR:   PO intake, Supplement acceptance, Labs, Weight trends, Skin, I & O's  REASON FOR ASSESSMENT:   Malnutrition Screening Tool    ASSESSMENT:   84 y.o. male with a known history of BPH, HTN, s/p cholecystectomy 9/9 admitted with weakness and abdominal pain    Met with pt and pt's son in room today. Pt is HOH and asked that history be obtained from his son. Son reports pt with poor appetite and oral intake at baseline. Pt will normally eat a generous breakfast but then just kind of grazes snacks throughout the day. Pt does drink one vanilla Ensure every day. Son reports that patient was doing fairly well after surgery up until the past 2 days where he has had severely decreased appetite, weakness and lethargy. Per chart, pt with a 10lb(7%) weight loss since his surgery. Pt initiated on a diet today. Pt is edentulous and requires a mechanical soft diet. RD will add supplements and MVI to help pt meet his estimated needs. MRCP pending.    Medications reviewed and include: aspirin, ferrous sulfate, heparin, B12, cefepime, LRS '@75ml'$ /hr, metronidazole, vancomycin  Labs reviewed:  P 4.0 wnl, Mg 2.0 wnl K 3.4(L), alk phos 796(H), AST 268(H), ALT 173(H), tbili 1.8(H)- 10/23 Wbc- 10.8(H), Hgb 10.7(L), Hct 32(L), MCV 100.3(H)  NUTRITION - FOCUSED PHYSICAL EXAM:    Most Recent Value  Orbital Region  Moderate  depletion  Upper Arm Region  Severe depletion  Thoracic and Lumbar Region  Moderate depletion  Buccal Region  Moderate depletion  Temple Region  Severe depletion  Clavicle Bone Region  Moderate depletion  Clavicle and Acromion Bone Region  Moderate depletion  Scapular Bone Region  Moderate depletion  Dorsal Hand  Moderate depletion  Patellar Region  Severe depletion  Anterior Thigh Region  Moderate depletion  Posterior Calf Region  Severe depletion  Edema (RD Assessment)  None  Hair  Reviewed  Eyes  Reviewed  Mouth  Reviewed  Skin  Reviewed  Nails  Reviewed     Diet Order:   Diet Order            DIET DYS 3 Room service appropriate? Yes; Fluid consistency: Thin  Diet effective now             EDUCATION NEEDS:   No education needs have been identified at this time  Skin:  Skin Assessment: Reviewed RN Assessment  Last BM:  10/23  Height:   Ht Readings from Last 1 Encounters:  03/12/18 '5\' 7"'$  (1.702 m)    Weight:   Wt Readings from Last 1 Encounters:  03/12/18 56.9 kg    Ideal Body Weight:  67.3 kg  BMI:  Body mass index is 19.64 kg/m.  Estimated Nutritional Needs:   Kcal:  1600-1800kcal/day   Protein:  74-85g/day   Fluid:  1.4-1.7L/day   Koleen Distance MS, RD, LDN Pager #- [phone] Office#- [phone] After Hours Pager: [phone]

## 2018-03-12 NOTE — Progress Notes (Addendum)
[place] at [facility]                                                                                                                                                                                  Patient Demographics   Jose [name], is a 84 y.o. male, DOB - Nov 01, 1923, HER:[id number]  Admit date - 03/11/2018   Admitting Physician Arta Silence, MD  Outpatient Primary MD for the patient is [name], MD   LOS - 0  Subjective:  Patient doing better, abdominal pain now resolved   Review of Systems:   CONSTITUTIONAL: No documented fever. No fatigue, weakness. No weight gain, no weight loss.  EYES: No blurry or double vision.  ENT: No tinnitus. No postnasal drip. No redness of the oropharynx.  RESPIRATORY: No cough, no wheeze, no hemoptysis. No dyspnea.  CARDIOVASCULAR: No chest pain. No orthopnea. No palpitations. No syncope.  GASTROINTESTINAL: No nausea, no vomiting or diarrhea. No abdominal pain. No melena or hematochezia.  GENITOURINARY: No dysuria or hematuria.  ENDOCRINE: No polyuria or nocturia. No heat or cold intolerance.  HEMATOLOGY: No anemia. No bruising. No bleeding.  INTEGUMENTARY: No rashes. No lesions.  MUSCULOSKELETAL: No arthritis. No swelling. No gout.  NEUROLOGIC: No numbness, tingling, or ataxia. No seizure-type activity.  PSYCHIATRIC: No anxiety. No insomnia. No ADD.    Vitals:   Vitals:   03/12/18 0200 03/12/18 0205 03/12/18 0253 03/12/18 1200  BP:  111/66 128/71   Pulse: 66 66 64   Resp: 19 (!) 22    Temp:      TempSrc:      SpO2: 96% 97% 98%   Weight:   56.9 kg   Height:    5\' 7"  (1.702 m)    Wt Readings from Last 3 Encounters:  03/12/18 56.9 kg  02/12/18 61.2 kg  01/27/18 61.1 kg     Intake/Output Summary (Last 24 hours) at 03/12/2018 1427 Last data filed at 03/12/2018 1100 Gross per 24 hour  Intake -  Output 600 ml  Net -600 ml    Physical Exam:   GENERAL: Pleasant-appearing in no  apparent distress.  HEAD, EYES, EARS, NOSE AND THROAT: Atraumatic, normocephalic. Extraocular muscles are intact. Pupils equal and reactive to light. Sclerae anicteric. No conjunctival injection. No oro-pharyngeal erythema.  NECK: Supple. There is no jugular venous distention. No bruits, no lymphadenopathy, no thyromegaly.  HEART: Regular rate and rhythm,. No murmurs, no rubs, no clicks.  LUNGS: Clear to auscultation bilaterally. No rales or rhonchi. No wheezes.  ABDOMEN: Soft, flat, nontender, nondistended. Has good bowel sounds. No hepatosplenomegaly appreciated.  EXTREMITIES: No evidence of any cyanosis, clubbing, or peripheral edema.  +  2 pedal and radial pulses bilaterally.  NEUROLOGIC: The patient is alert, awake, and oriented x3 with no focal motor or sensory deficits appreciated bilaterally.  SKIN: Moist and warm with no rashes appreciated.  Psych: Not anxious, depressed LN: No inguinal LN enlargement    Antibiotics   Anti-infectives (From admission, onward)   Start     Dose/Rate Route Frequency Ordered Stop   03/12/18 2300  ceFEPIme (MAXIPIME) 1 g in sodium chloride 0.9 % 100 mL IVPB     1 g 200 mL/hr over 30 Minutes Intravenous Every 24 hours 03/12/18 0305     03/12/18 1800  vancomycin (VANCOCIN) IVPB 750 mg/150 ml premix  Status:  Discontinued     750 mg 150 mL/hr over 60 Minutes Intravenous Every 24 hours 03/12/18 0305 03/12/18 1426   03/11/18 2330  ceFEPIme (MAXIPIME) 2 g in sodium chloride 0.9 % 100 mL IVPB     2 g 200 mL/hr over 30 Minutes Intravenous  Once 03/11/18 2318 03/12/18 0027   03/11/18 2330  metroNIDAZOLE (FLAGYL) IVPB 500 mg     500 mg 100 mL/hr over 60 Minutes Intravenous Every 8 hours 03/11/18 2318     03/11/18 2330  vancomycin (VANCOCIN) IVPB 1000 mg/200 mL premix     1,000 mg 200 mL/hr over 60 Minutes Intravenous  Once 03/11/18 2318 03/12/18 0201      Medications   Scheduled Meds: . aspirin EC  81 mg Oral Daily  . feeding supplement (ENSURE ENLIVE)   237 mL Oral BID BM  . ferrous sulfate  325 mg Oral Q breakfast  . gabapentin  600 mg Oral BID  . heparin injection (subcutaneous)  5,000 Units Subcutaneous Q8H  . multivitamin with minerals  1 tablet Oral Daily  . vitamin B-12  500 mcg Oral Daily   Continuous Infusions: . ceFEPime (MAXIPIME) IV    . lactated ringers 75 mL/hr at 03/12/18 0437  . metronidazole Stopped (03/12/18 0918)   PRN Meds:.acetaminophen **OR** acetaminophen, bisacodyl, morphine injection, ondansetron **OR** ondansetron (ZOFRAN) IV, senna-docusate   Data Review:   Micro Results Recent Results (from the past 240 hour(s))  Blood culture (routine x 2)     Status: None (Preliminary result)   Collection Time: 03/11/18  9:11 PM  Result Value Ref Range Status   Specimen Description BLOOD BLOOD RIGHT FOREARM  Final   Special Requests   Final    BOTTLES DRAWN AEROBIC AND ANAEROBIC Blood Culture adequate volume   Culture   Final    NO GROWTH < 12 HOURS Performed at [facility], [address] [place], [place] [address]    Report Status PENDING  Incomplete  Blood culture (routine x 2)     Status: None (Preliminary result)   Collection Time: 03/11/18  9:11 PM  Result Value Ref Range Status   Specimen Description BLOOD BLOOD LEFT FOREARM  Final   Special Requests   Final    BOTTLES DRAWN AEROBIC AND ANAEROBIC Blood Culture adequate volume   Culture   Final    NO GROWTH < 12 HOURS Performed at [facility], [address] [place], [place] [address]    Report Status PENDING  Incomplete    Radiology Reports Ct Abdomen Pelvis W Contrast  Result Date: 03/11/2018 CLINICAL DATA:  Increased weakness with generalized abdominal pain EXAM: CT ABDOMEN AND PELVIS WITH CONTRAST TECHNIQUE: Multidetector CT imaging of the abdomen and pelvis was performed using the standard protocol following bolus administration of intravenous contrast. CONTRAST:  80mL  OMNIPAQUE IOHEXOL 300 MG/ML  SOLN COMPARISON:   01/04/2018, 12/27/2017 FINDINGS: Lower chest: Lung bases demonstrate hazy dependent atelectasis. Heart size within normal limits. Hepatobiliary: Intra and extrahepatic biliary dilatation status post cholecystectomy. Common bile duct diameter up to 13 mm. Pancreas: Diffuse ductal dilatation. Hazy indistinct appearance of pancreas consistent with pancreatitis. No organized fluid collection. Spleen: Normal in size without focal abnormality. Adrenals/Urinary Tract: Adrenal glands are normal. Scarring left kidney. Cyst mid left kidney. No hydronephrosis. The bladder is normal Stomach/Bowel: Stomach is nonenlarged. No dilated small bowel. No colon wall thickening. Sigmoid colon diverticula without acute inflammatory process. Mild retained feces at the rectum. Vascular/Lymphatic: Moderate aortic atherosclerosis. No aneurysm. No significantly enlarged lymph nodes. Reproductive: Prostate calcification with slight enlargement. Other: Negative for free air or free fluid. Small fat in the left inguinal canal. Musculoskeletal: Moderate severe diffuse degenerative changes. Trace retrolisthesis L3 on L4, L2 on L3. No acute or suspicious abnormality. IMPRESSION: 1. Indistinct appearance of pancreas with mild surrounding edema/stranding, suspicious for an acute pancreatitis. No organized fluid collection. 2. Interval cholecystectomy with development of intra and extrahepatic biliary enlargement which may be correlated with LFTs. 3. Sigmoid colon diverticular disease without acute inflammatory process Electronically Signed   By: Donavan Foil M.D.   On: 03/11/2018 23:09   Dg Chest Portable 1 View  Result Date: 03/11/2018 CLINICAL DATA:  Weakness 1 week. EXAM: PORTABLE CHEST 1 VIEW COMPARISON:  12/27/2017 FINDINGS: Lungs are adequately inflated without focal consolidation or effusion. Cardiomediastinal silhouette and remainder of the exam is unchanged. IMPRESSION: No active disease. Electronically Signed   By: Marin Olp M.D.    On: 03/11/2018 21:41   US Abdomen Limited Ruq  Result Date: 03/12/2018 CLINICAL DATA:  84 year old with pancreatitis. Cholecystectomy last month. Elevated LFTs and bilirubin. EXAM: ULTRASOUND ABDOMEN LIMITED RIGHT UPPER QUADRANT COMPARISON:  Abdominal CT yesterday. FINDINGS: Gallbladder: Surgically absent.  No fluid collection in the gallbladder fossa. Common bile duct: Diameter: Dilated at 11 mm.  Distal CBD is obscured. Liver: Intrahepatic biliary ductal dilatation. No focal lesion identified. Within normal limits in parenchymal echogenicity. Portal vein is patent on color Doppler imaging with normal direction of blood flow towards the liver. IMPRESSION: 1. Intra and extrahepatic biliary ductal dilatation, seen on CT. The distal common bile duct is obscured. No choledocholithiasis were visualized. 2. Postcholecystectomy. Electronically Signed   By: Keith Rake M.D.   On: 03/12/2018 01:22     CBC Recent Labs  Lab 03/11/18 2112  WBC 10.8*  HGB 10.7*  HCT 32.0*  PLT 128*  MCV 100.3*  MCH 33.5  MCHC 33.4  RDW 14.1    Chemistries  Recent Labs  Lab 03/11/18 2111 03/11/18 2112 03/12/18 0732  NA  --  138  --   K  --  3.4*  --   CL  --  104  --   CO2  --  23  --   GLUCOSE  --  159*  --   BUN  --  19  --   CREATININE  --  1.39*  --   CALCIUM  --  8.9  --   MG  --   --  2.0  AST 268*  --   --   ALT 173*  --   --   ALKPHOS 796*  --   --   BILITOT 4.2*  --   --    ------------------------------------------------------------------------------------------------------------------ estimated creatinine clearance is 27.3 mL/min (A) (by C-G formula based on SCr of 1.39 mg/dL (H)). ------------------------------------------------------------------------------------------------------------------  No results for input(s): HGBA1C in the last 72 hours. ------------------------------------------------------------------------------------------------------------------ No results for  input(s): CHOL, HDL, LDLCALC, TRIG, CHOLHDL, LDLDIRECT in the last 72 hours. ------------------------------------------------------------------------------------------------------------------ No results for input(s): TSH, T4TOTAL, T3FREE, THYROIDAB in the last 72 hours.  Invalid input(s): FREET3 ------------------------------------------------------------------------------------------------------------------ Recent Labs    03/12/18 0732  VITAMINB12 1,358*  FOLATE 20.5    Coagulation profile No results for input(s): INR, PROTIME in the last 168 hours.  No results for input(s): DDIMER in the last 72 hours.  Cardiac Enzymes Recent Labs  Lab 03/11/18 2112 03/12/18 0732  TROPONINI 0.08* 0.04*   ------------------------------------------------------------------------------------------------------------------ Invalid input(s): POCBNP    Assessment & Plan  Patient is 84 year old presenting with abdominal pain  1.  Abdominal pain due to acute pancreatitis Continue supportive care MRCP pending  2.  Sepsis with hypotension source likely abdominal continue antibiotics discontinue vancomycin follow blood cultures  3.  Acute kidney injury due to dehydration continue IV fluids   4.  Elevated liver function test possibly due to #1 continue to follow liver function tests  5.  Hypertension currently hypotensive stopped blood pressure  6.  GERD continue PPI  7.  BPH continue Flomax     Code Status Orders  (From admission, onward)         Start     Ordered   03/12/18 0245  Full code  Continuous     03/12/18 0244        Code Status History    Date Active Date Inactive Code Status Order ID Comments User Context   01/26/2018 1208 01/27/2018 1759 Full Code [id number]  Coralie Keens, MD Inpatient   01/04/2018 0821 01/05/2018 1940 Full Code [id number]  Herbert Pun, MD ED   12/27/2017 1338 12/30/2017 1723 Full Code [id number]  Benjamine Sprague, DO Inpatient            Consults none   DVT Prophylaxis  Lovenox    Lab Results  Component Value Date   PLT 128 (L) 03/11/2018     Time Spent in minutes   1min from 1:45 PM to 2:30 PM  Greater than 50% of time spent in care coordination and counseling patient regarding the condition and plan of care.   Dustin Flock M.D on 03/12/2018 at 2:27 PM  Between 7am to 6pm - Pager - [phone]  After 6pm go to [email or web address] - Proofreader  [employer]   Office  [phone]

## 2018-03-12 NOTE — Progress Notes (Signed)
PHARMACY - PHYSICIAN COMMUNICATION CRITICAL VALUE ALERT - BLOOD CULTURE IDENTIFICATION (BCID)  Results for orders placed or performed during the hospital encounter of 03/11/18  Blood Culture ID Panel (Reflexed) (Collected: 03/11/2018  9:11 PM)  Result Value Ref Range   Enterococcus species NOT DETECTED NOT DETECTED   Listeria monocytogenes NOT DETECTED NOT DETECTED   Staphylococcus species NOT DETECTED NOT DETECTED   Staphylococcus aureus (BCID) NOT DETECTED NOT DETECTED   Streptococcus species NOT DETECTED NOT DETECTED   Streptococcus agalactiae NOT DETECTED NOT DETECTED   Streptococcus pneumoniae NOT DETECTED NOT DETECTED   Streptococcus pyogenes NOT DETECTED NOT DETECTED   Acinetobacter baumannii NOT DETECTED NOT DETECTED   Enterobacteriaceae species DETECTED (A) NOT DETECTED   Enterobacter cloacae complex NOT DETECTED NOT DETECTED   Escherichia coli DETECTED (A) NOT DETECTED   Klebsiella oxytoca NOT DETECTED NOT DETECTED   Klebsiella pneumoniae NOT DETECTED NOT DETECTED   Proteus species NOT DETECTED NOT DETECTED   Serratia marcescens NOT DETECTED NOT DETECTED   Carbapenem resistance NOT DETECTED NOT DETECTED   Haemophilus influenzae NOT DETECTED NOT DETECTED   Neisseria meningitidis NOT DETECTED NOT DETECTED   Pseudomonas aeruginosa NOT DETECTED NOT DETECTED   Candida albicans NOT DETECTED NOT DETECTED   Candida glabrata NOT DETECTED NOT DETECTED   Candida krusei NOT DETECTED NOT DETECTED   Candida parapsilosis NOT DETECTED NOT DETECTED   Candida tropicalis NOT DETECTED NOT DETECTED    Name of physician (or Provider) Contacted:  Shreyang Patel   Changes to prescribed antibiotics required:   Meropenem 1 gm IV Q12H ordered to start on 10/24 @ 17:30.   Will d/c cefepime.   Justis Dupas D 03/12/2018  5:19 PM

## 2018-03-12 NOTE — H&P (Signed)
[facility] [place] NAME: Jose [name]    MR#:  [id number]  DATE OF BIRTH:  July 22, 1923  DATE OF ADMISSION:  03/11/2018  PRIMARY CARE PHYSICIAN: [name], MD   REQUESTING/REFERRING PHYSICIAN: Arta Silence, MD  CHIEF COMPLAINT:   Chief Complaint  Patient presents with  . Weakness    HISTORY OF PRESENT ILLNESS:  Jose [name]  is a 84 y.o. male with a known history of BPH, HTN p/w generalized weakness. He reportedly also had abdominal discomfort and nausea on admission. He is not able to tell me very much. He is severely hard of hearing, but is wearing his L ear hearing aid. He tells me his name, and he can tell me he is in the hospital. He cannot tell me the city or year. He tells me he feels cold. He tells me he feels weak. He tells me he has not been drinking or eating properly. He denies pain at the time of my assessment. He repeats that he is cold. He cannot provide any further Hx or ROS. My understanding is that he is reasonably healthy at baseline. There is no documentation of dementia.  Based on review of prior documentation, pt recently had acute cholecystitis, initially managed w/ percutaneous cholecystostomy (08/12). Pt later underwent lap chole (09/09).  Pt tachypneic and hypotensive on admission, however (-) fever, WBC < 12.0, SIRS (-). No obvious source of acute infxn, U/A (-), CXR (-). Labwork (+) transaminasemia + mild hyperbilirubinemia, CT A/P (-) abscess or obvious infxn, (+) possible acute pancreatitis (Lipase WNL @ 29). RUQ U/S (+) extrahepatic + intrahepatic bile duct dilation, (-) choledocholithiasis, (-) fluid collection. Lactate initially 3.5, improved to 1.4 w/ IVF. (+) AKI, baseline Cr 0.8-0.9, Cr 1.39 on present admission. He is thin, pale and ill-appearing. He appears uncomfortable, but does not appear toxic. He repeats he is cold, but is not in distress.  PAST MEDICAL HISTORY:   Past Medical  History:  Diagnosis Date  . Anxiety   . Back problem 1960  . BPH (benign prostatic hypertrophy)   . Cervical radiculopathy    with neuropathy  . Dyspnea   . GERD (gastroesophageal reflux disease)   . Hypertension   . Hypokalemia   . Melanoma in situ ([place]) 12/2016   neck    PAST SURGICAL HISTORY:   Past Surgical History:  Procedure Laterality Date  . CATARACT EXTRACTION     OD  . CHOLECYSTECTOMY N/A 01/26/2018   Procedure: LAPAROSCOPIC CHOLECYSTECTOMY;  Surgeon: Coralie Keens, MD;  Location: [facility];  Service: General;  Laterality: N/A;  . IR PERC CHOLECYSTOSTOMY  12/28/2017  . LAPAROSCOPIC CHOLECYSTECTOMY  01/26/2018  . MELANOMA EXCISION  12/2016   in situ---Dr Dasher  . TONSILLECTOMY      SOCIAL HISTORY:   Social History   Tobacco Use  . Smoking status: Never Smoker  . Smokeless tobacco: Never Used  Substance Use Topics  . Alcohol use: No    FAMILY HISTORY:   Family History  Problem Relation Age of Onset  . Cancer Son   . Coronary artery disease Neg Hx   . Diabetes Neg Hx     DRUG ALLERGIES:  No Known Allergies  REVIEW OF SYSTEMS:   Review of Systems  Unable to perform ROS: Mental status change  Gastrointestinal: Positive for abdominal pain and nausea.  Neurological: Positive for weakness.   MEDICATIONS AT HOME:   Prior to Admission medications   Medication Sig Start  Date End Date Taking? Authorizing Provider  acetaminophen (TYLENOL) 500 MG tablet Take 1,000 mg by mouth every 6 (six) hours as needed.   Yes [provider]  aspirin 81 MG tablet Take 81 mg by mouth daily.     Yes [provider]  gabapentin (NEURONTIN) 600 MG tablet TAKE 1 TABLET (600 MG TOTAL) BY MOUTH 2 (TWO) TIMES DAILY. Patient taking differently: Take 600 mg by mouth 2 (two) times daily.  07/21/17  Yes [name], MD  ketoconazole (NIZORAL) 2 % cream Apply 1 application topically 2 (two) times daily as needed for irritation. 05/07/17  Yes [name],  MD  potassium chloride SA (K-DUR,KLOR-CON) 20 MEQ tablet Take 2 tablets (40 mEq total) by mouth daily. Patient taking differently: Take 40 mEq by mouth 2 (two) times daily.  01/06/18  Yes Viviana Simpler I, MD  tamsulosin (FLOMAX) 0.4 MG CAPS capsule TAKE 1 CAPSULE (0.4 MG TOTAL) BY MOUTH DAILY. Patient taking differently: Take 0.4 mg by mouth daily.  07/21/17  Yes Viviana Simpler I, MD  triamcinolone cream (KENALOG) 0.1 % Apply 1 application topically 2 (two) times daily as needed. Patient taking differently: Apply 1 application topically 2 (two) times daily as needed (irritation).  09/05/16  Yes [name], MD  vitamin B-12 (CYANOCOBALAMIN) 500 MCG tablet Take 500 mcg by mouth daily.   Yes [provider]  ferrous sulfate 325 (65 FE) MG EC tablet Take 325 mg by mouth daily with breakfast.    [provider]      VITAL SIGNS:  Blood pressure 128/71, pulse 64, temperature 100 F (37.8 C), temperature source Rectal, resp. rate (!) 22, weight 56.9 kg, SpO2 98 %.  PHYSICAL EXAMINATION:  Physical Exam  Constitutional: He appears well-developed. He is active and cooperative.  Non-toxic appearance. He has a sickly appearance. He appears ill. No distress. He is not intubated.  HENT:  Head: Atraumatic.  Mouth/Throat: Oropharynx is clear and moist. No oropharyngeal exudate.  Eyes: Conjunctivae, EOM and lids are normal. No scleral icterus.  Neck: Neck supple. No JVD present. No thyromegaly present.  Cardiovascular: Normal rate, regular rhythm, S1 normal, S2 normal and normal heart sounds. Exam reveals no gallop, no S3, no S4, no distant heart sounds and no friction rub.  No murmur heard. Pulmonary/Chest: Effort normal. No accessory muscle usage or stridor. Tachypnea noted. No apnea and no bradypnea. He is not intubated. No respiratory distress. He has decreased breath sounds in the right upper field, the right middle field, the right lower field, the left upper field, the left  middle field and the left lower field. He has no wheezes. He has no rhonchi. He has no rales.  Abdominal: Soft. He exhibits no distension. Bowel sounds are decreased. There is no tenderness. There is no rigidity, no rebound and no guarding.  Musculoskeletal: Normal range of motion. He exhibits no edema or tenderness.  Lymphadenopathy:    He has no cervical adenopathy.  Neurological: He is alert. He is disoriented.  (+) HOH. AAOx1 (as per HPI).  Skin: Skin is warm and dry. No rash noted. He is not diaphoretic. No erythema. There is pallor.  Psychiatric: He is not actively hallucinating. Cognition and memory are impaired.  (+) HOH. AAOx1 (as per HPI). He is attentive.   LABORATORY PANEL:   CBC Recent Labs  Lab 03/11/18 2112  WBC 10.8*  HGB 10.7*  HCT 32.0*  PLT 128*   ------------------------------------------------------------------------------------------------------------------  Chemistries  Recent Labs  Lab  03/11/18 2111 03/11/18 2112  NA  --  138  K  --  3.4*  CL  --  104  CO2  --  23  GLUCOSE  --  159*  BUN  --  19  CREATININE  --  1.39*  CALCIUM  --  8.9  AST 268*  --   ALT 173*  --   ALKPHOS 796*  --   BILITOT 4.2*  --    ------------------------------------------------------------------------------------------------------------------  Cardiac Enzymes Recent Labs  Lab 03/11/18 2112  TROPONINI 0.08*   ------------------------------------------------------------------------------------------------------------------  RADIOLOGY:  Ct Abdomen Pelvis W Contrast  Result Date: 03/11/2018 CLINICAL DATA:  Increased weakness with generalized abdominal pain EXAM: CT ABDOMEN AND PELVIS WITH CONTRAST TECHNIQUE: Multidetector CT imaging of the abdomen and pelvis was performed using the standard protocol following bolus administration of intravenous contrast. CONTRAST:  37mL OMNIPAQUE IOHEXOL 300 MG/ML  SOLN COMPARISON:  01/04/2018, 12/27/2017 FINDINGS: Lower chest: Lung  bases demonstrate hazy dependent atelectasis. Heart size within normal limits. Hepatobiliary: Intra and extrahepatic biliary dilatation status post cholecystectomy. Common bile duct diameter up to 13 mm. Pancreas: Diffuse ductal dilatation. Hazy indistinct appearance of pancreas consistent with pancreatitis. No organized fluid collection. Spleen: Normal in size without focal abnormality. Adrenals/Urinary Tract: Adrenal glands are normal. Scarring left kidney. Cyst mid left kidney. No hydronephrosis. The bladder is normal Stomach/Bowel: Stomach is nonenlarged. No dilated small bowel. No colon wall thickening. Sigmoid colon diverticula without acute inflammatory process. Mild retained feces at the rectum. Vascular/Lymphatic: Moderate aortic atherosclerosis. No aneurysm. No significantly enlarged lymph nodes. Reproductive: Prostate calcification with slight enlargement. Other: Negative for free air or free fluid. Small fat in the left inguinal canal. Musculoskeletal: Moderate severe diffuse degenerative changes. Trace retrolisthesis L3 on L4, L2 on L3. No acute or suspicious abnormality. IMPRESSION: 1. Indistinct appearance of pancreas with mild surrounding edema/stranding, suspicious for an acute pancreatitis. No organized fluid collection. 2. Interval cholecystectomy with development of intra and extrahepatic biliary enlargement which may be correlated with LFTs. 3. Sigmoid colon diverticular disease without acute inflammatory process Electronically Signed   By: Donavan Foil M.D.   On: 03/11/2018 23:09   Dg Chest Portable 1 View  Result Date: 03/11/2018 CLINICAL DATA:  Weakness 1 week. EXAM: PORTABLE CHEST 1 VIEW COMPARISON:  12/27/2017 FINDINGS: Lungs are adequately inflated without focal consolidation or effusion. Cardiomediastinal silhouette and remainder of the exam is unchanged. IMPRESSION: No active disease. Electronically Signed   By: Marin Olp M.D.   On: 03/11/2018 21:41   US Abdomen Limited  Ruq  Result Date: 03/12/2018 CLINICAL DATA:  84 year old with pancreatitis. Cholecystectomy last month. Elevated LFTs and bilirubin. EXAM: ULTRASOUND ABDOMEN LIMITED RIGHT UPPER QUADRANT COMPARISON:  Abdominal CT yesterday. FINDINGS: Gallbladder: Surgically absent.  No fluid collection in the gallbladder fossa. Common bile duct: Diameter: Dilated at 11 mm.  Distal CBD is obscured. Liver: Intrahepatic biliary ductal dilatation. No focal lesion identified. Within normal limits in parenchymal echogenicity. Portal vein is patent on color Doppler imaging with normal direction of blood flow towards the liver. IMPRESSION: 1. Intra and extrahepatic biliary ductal dilatation, seen on CT. The distal common bile duct is obscured. No choledocholithiasis were visualized. 2. Postcholecystectomy. Electronically Signed   By: Keith Rake M.D.   On: 03/12/2018 01:22   IMPRESSION AND PLAN:   A/P: 29M p/w generalized weakness, abdominal discomfort, nausea. Hypotensive (improved), tachypneic, but SIRS (-). Possible AMS. U/A (-), CXR (-). CT A/P suggests acute pancreatitis, Lipase WNL. (+) transaminasemia, hyperbilirubinemia, elevated lactate. Cr elevation/AKI (on  CKD). Troponin elevation. Hypokalemia, hyperglycemia, hypoalbuminemia, macrocytic anemia. -Weakness, AP/N, questionable pancreatitis, transaminasemia, hyperbilirubinemia, elevated lactate: Pt w/ recent cholecystectomy. Now presents as noted above. APhos 796, AST 268, ALT 173, TBili 4.2, DBili 2.4, LD 244. Lactate 3.5, improved to 1.4. CT A/P suggests acute pancreatitis. Lipase WNL (29). RUQ U/S (-) fluid collection or choledocholithiasis. MRCP pending. Dehydrated. IVF, pain ctrl, antiemetics PRN. -Possible AMS: AAOx1 (self). Reportedly healthy at baseline, no documentation of dementia. Hold Gabapentin. -Dehydration, Cr elevation, AKI on CKD: USpGr 1.020. Cr 1.39. Baseline 0.8-0.9. AKI. Likely prerenal, poor intake. 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  IVF. CT A/P (-) obstruction. Monitor BMP, avoid nephrotoxins. -Troponin elevation: Trop-I 0.08. Rpt pending. Likely 2/2 impaired renal clearance. EKG (-). ASA, cardiac monitoring. -Hypokalemia: Replete K+. Mag level pending. -Hypoalbuminemia: Prealbumin. -Macrocytic anemia: Hgb 10.7, MV 100.3. B12, folate levels. -c/w other home meds. -FEN/GI: NPO, ADAT. -DVT PPx: Heparin. -Code status: Full code. -Disposition: Admission, > 2 midnights.   All the records are reviewed and case discussed with ED provider. Management plans discussed with the patient, family and they are in agreement.  CODE STATUS: Full code.  TOTAL TIME TAKING CARE OF THIS PATIENT: 75 minutes.    Arta Silence M.D on 03/12/2018 at 3:48 AM  Between 7am to 6pm - Pager - [phone]  After 6pm go to [email or web address] - Proofreader  [facility]  Office  [phone]  CC: Primary care physician; [name], MD   Note: This dictation was prepared with Dragon dictation along with smaller phrase technology. Any transcriptional errors that result from this process are unintentional.

## 2018-03-12 NOTE — Telephone Encounter (Signed)
I was unable to reach [place] by phone.

## 2018-03-12 NOTE — Progress Notes (Signed)
CODE SEPSIS - PHARMACY COMMUNICATION  **Broad Spectrum Antibiotics should be administered within 1 hour of Sepsis diagnosis**  Time Code Sepsis Called/Page Received: 0245  Antibiotics Ordered: vanc/cefepime  Time of 1st antibiotic administration: 2349  Additional action taken by pharmacy:   If necessary, Name of Provider/Nurse Contacted:     Tobie Lords ,PharmD Clinical Pharmacist  03/12/2018  3:11 AM

## 2018-03-12 NOTE — Progress Notes (Signed)
Pt ambulated in the hallway. No issues noted. Pt ambulated 2 laps around nursing station with SBA and walker.

## 2018-03-12 NOTE — Progress Notes (Signed)
Pharmacy Antibiotic Note  Jose [name] is a 84 y.o. male admitted on 03/11/2018 with bacteremia.  Pharmacy has been consulted for Meropenem dosing.  Plan: Meropenem 1 gm IV Q12H  Height: 5\' 7"  (170.2 cm) Weight: 125 lb 6.4 oz (56.9 kg) IBW/kg (Calculated) : 66.1  Temp (24hrs), Avg:98.5 F (36.9 C), Min:97.7 F (36.5 C), Max:100 F (37.8 C)  Recent Labs  Lab 03/11/18 2112 03/11/18 2318  WBC 10.8*  --   CREATININE 1.39*  --   LATICACIDVEN 3.5* 1.4    Estimated Creatinine Clearance: 27.3 mL/min (A) (by C-G formula based on SCr of 1.39 mg/dL (H)).    No Known Allergies  Antimicrobials this admission:   >>    >>   Dose adjustments this admission:   Microbiology results:  BCx:   UCx:    Sputum:    MRSA PCR:   Thank you for allowing pharmacy to be a part of this patient's care.  Krishang Reading D 03/12/2018 5:20 PM

## 2018-03-12 NOTE — Plan of Care (Signed)

## 2018-03-12 NOTE — Care Management Note (Addendum)
Case Management Note  Patient Details  Name: Jose [name] MRN: [id number] Date of Birth: 1923/06/09  Subjective/Objective:   Patient from home, lives alone.  Son, Kimble Hitchens lives 1.5 miles down the road [phone].  States he gets along well at home.  He and his wife check on him.  He has a walker at home but would like one with the wheels and seat.  Admitted with pancreatitis.  Anderson Malta RN, notified RNCM that patient could benefit from home health services.   They have used [facility] in the past and would like to use them again.  Heads up referral made to [facility] with [facility].  Denies difficulties obtaining medications or with transportation.  No current services in the home and no O2 requirements.               Action/Plan:Will continue to follow as patient progresses. Placed order for PT evaluation.   Expected Discharge Date:                  Expected Discharge Plan:  [place]  In-House Referral:     Discharge planning Services  CM Consult  Post Acute Care Choice:    Choice offered to:  Adult Children  DME Arranged:    DME Agency:     HH Arranged:  RN, PT, Nurse's Aide [place] Agency:  [facility]  Status of Service:  Completed, signed off  If discussed at [place] of Stay Meetings, dates discussed:    Additional Comments:  Elza Rafter, RN 03/12/2018, 4:36 PM

## 2018-03-12 NOTE — Telephone Encounter (Signed)
Copied from [place] [id number]. Topic: General - Other >> Mar 12, 2018  8:09 AM Yvette Rack wrote: Reason for CRM: pt wife [name] calling to speak with Dr Silvio Pate her husband is in the hospital was admitted last night please call her at [phone]

## 2018-03-13 ENCOUNTER — Encounter: Payer: Self-pay | Admitting: *Deleted

## 2018-03-13 ENCOUNTER — Inpatient Hospital Stay: Payer: Medicare Other | Admitting: Certified Registered Nurse Anesthetist

## 2018-03-13 ENCOUNTER — Inpatient Hospital Stay: Payer: Medicare Other

## 2018-03-13 ENCOUNTER — Encounter
Admission: EM | Disposition: A | Discharge status: Home Health | Payer: Self-pay | Source: Home / Self Care | Attending: Internal Medicine

## 2018-03-13 DIAGNOSIS — R945 Abnormal results of liver function studies: Secondary | ICD-10-CM

## 2018-03-13 DIAGNOSIS — R17 Unspecified jaundice: Secondary | ICD-10-CM

## 2018-03-13 DIAGNOSIS — K831 Obstruction of bile duct: Secondary | ICD-10-CM

## 2018-03-13 HISTORY — PX: ENDOSCOPIC RETROGRADE CHOLANGIOPANCREATOGRAPHY (ERCP) WITH PROPOFOL: SHX5810

## 2018-03-13 LAB — CBC WITH DIFFERENTIAL/PLATELET
Abs Immature Granulocytes: 0.03 10*3/uL (ref 0.00–0.07)
BASOS PCT: 0 %
Basophils Absolute: 0 10*3/uL (ref 0.0–0.1)
EOS ABS: 0.1 10*3/uL (ref 0.0–0.5)
EOS PCT: 1 %
HCT: 28.4 % — ABNORMAL LOW (ref 39.0–52.0)
HEMOGLOBIN: 9.6 g/dL — AB (ref 13.0–17.0)
Immature Granulocytes: 1 %
Lymphocytes Relative: 7 %
Lymphs Abs: 0.3 10*3/uL — ABNORMAL LOW (ref 0.7–4.0)
MCH: 33.4 pg (ref 26.0–34.0)
MCHC: 33.8 g/dL (ref 30.0–36.0)
MCV: 99 fL (ref 80.0–100.0)
Monocytes Absolute: 0.3 10*3/uL (ref 0.1–1.0)
Monocytes Relative: 5 %
Neutro Abs: 4.5 10*3/uL (ref 1.7–7.7)
Neutrophils Relative %: 86 %
PLATELETS: 99 10*3/uL — AB (ref 150–400)
RBC: 2.87 MIL/uL — AB (ref 4.22–5.81)
RDW: 14.4 % (ref 11.5–15.5)
WBC: 5.2 10*3/uL (ref 4.0–10.5)
nRBC: 0 % (ref 0.0–0.2)

## 2018-03-13 LAB — COMPREHENSIVE METABOLIC PANEL
ALT: 92 U/L — ABNORMAL HIGH (ref 0–44)
ANION GAP: 8 (ref 5–15)
AST: 76 U/L — ABNORMAL HIGH (ref 15–41)
Albumin: 2.5 g/dL — ABNORMAL LOW (ref 3.5–5.0)
Alkaline Phosphatase: 465 U/L — ABNORMAL HIGH (ref 38–126)
BILIRUBIN TOTAL: 2.5 mg/dL — AB (ref 0.3–1.2)
BUN: 18 mg/dL (ref 8–23)
CO2: 21 mmol/L — ABNORMAL LOW (ref 22–32)
Calcium: 8 mg/dL — ABNORMAL LOW (ref 8.9–10.3)
Chloride: 108 mmol/L (ref 98–111)
Creatinine, Ser: 0.73 mg/dL (ref 0.61–1.24)
GFR calc Af Amer: >60 mL/min (ref >60)
Glucose, Bld: 139 mg/dL — ABNORMAL HIGH (ref 70–99)
Potassium: 3.6 mmol/L (ref 3.5–5.1)
Sodium: 137 mmol/L (ref 135–145)
TOTAL PROTEIN: 5 g/dL — AB (ref 6.5–8.1)

## 2018-03-13 LAB — CBC
HCT: 23.7 % — ABNORMAL LOW (ref 39.0–52.0)
Hemoglobin: 8 g/dL — ABNORMAL LOW (ref 13.0–17.0)
MCH: 33.6 pg (ref 26.0–34.0)
MCHC: 33.8 g/dL (ref 30.0–36.0)
MCV: 99.6 fL (ref 80.0–100.0)
PLATELETS: 91 10*3/uL — AB (ref 150–400)
RBC: 2.38 MIL/uL — ABNORMAL LOW (ref 4.22–5.81)
RDW: 14.4 % (ref 11.5–15.5)
WBC: 5.4 10*3/uL (ref 4.0–10.5)
nRBC: 0 % (ref 0.0–0.2)

## 2018-03-13 LAB — PROCALCITONIN: Procalcitonin: 9.39 ng/mL

## 2018-03-13 LAB — PROTIME-INR
INR: 1.45
Prothrombin Time: 17.5 seconds — ABNORMAL HIGH (ref 11.4–15.2)

## 2018-03-13 LAB — IRON AND TIBC
IRON: 53 ug/dL (ref 45–182)
Saturation Ratios: 31 % (ref 17.9–39.5)
TIBC: 171 ug/dL — ABNORMAL LOW (ref 250–450)
UIBC: 118 ug/dL

## 2018-03-13 LAB — FERRITIN: FERRITIN: 1122 ng/mL — AB (ref 24–336)

## 2018-03-13 LAB — LIPASE, BLOOD: LIPASE: 28 U/L (ref 11–51)

## 2018-03-13 SURGERY — ENDOSCOPIC RETROGRADE CHOLANGIOPANCREATOGRAPHY (ERCP) WITH PROPOFOL
Anesthesia: General

## 2018-03-13 MED ORDER — PROPOFOL 10 MG/ML IV BOLUS
INTRAVENOUS | Status: AC
  Filled 2018-03-13: qty 20

## 2018-03-13 MED ORDER — SUCCINYLCHOLINE CHLORIDE 20 MG/ML IJ SOLN
INTRAMUSCULAR | Status: DC | PRN
  Administered 2018-03-13: 60 mg via INTRAVENOUS

## 2018-03-13 MED ORDER — LIDOCAINE HCL (CARDIAC) PF 100 MG/5ML IV SOSY
PREFILLED_SYRINGE | INTRAVENOUS | Status: DC | PRN
  Administered 2018-03-13: 60 mg via INTRAVENOUS

## 2018-03-13 MED ORDER — SODIUM CHLORIDE 0.9 % IV SOLN
INTRAVENOUS | Status: DC
  Administered 2018-03-13: 14:00:00 via INTRAVENOUS

## 2018-03-13 MED ORDER — FENTANYL CITRATE (PF) 100 MCG/2ML IJ SOLN: 25.0000 ug | INTRAMUSCULAR | Status: DC | PRN

## 2018-03-13 MED ORDER — ONDANSETRON HCL 4 MG/2ML IJ SOLN: 4.0000 mg | Freq: Once | INTRAMUSCULAR | Status: DC | PRN

## 2018-03-13 MED ORDER — PROPOFOL 10 MG/ML IV BOLUS
INTRAVENOUS | Status: DC | PRN
  Administered 2018-03-13 (×2): 50 mg via INTRAVENOUS

## 2018-03-13 MED ORDER — FENTANYL CITRATE (PF) 100 MCG/2ML IJ SOLN
INTRAMUSCULAR | Status: DC | PRN
  Administered 2018-03-13 (×3): 25 ug via INTRAVENOUS

## 2018-03-13 MED ORDER — FENTANYL CITRATE (PF) 100 MCG/2ML IJ SOLN
INTRAMUSCULAR | Status: AC
  Filled 2018-03-13: qty 2

## 2018-03-13 MED ORDER — INDOMETHACIN 50 MG RE SUPP
100.0000 mg | Freq: Once | RECTAL | Status: AC
  Administered 2018-03-13: 100 mg via RECTAL
  Filled 2018-03-13: qty 2

## 2018-03-13 MED ORDER — PHENYLEPHRINE HCL 10 MG/ML IJ SOLN
INTRAMUSCULAR | Status: DC | PRN
  Administered 2018-03-13: 200 ug via INTRAVENOUS
  Administered 2018-03-13 (×6): 100 ug via INTRAVENOUS

## 2018-03-13 NOTE — Progress Notes (Signed)
Consent obtained. Patient being transported to endoscopy.

## 2018-03-13 NOTE — Anesthesia Procedure Notes (Signed)
Procedure Name: Intubation Performed by: Willette Alma, CRNA Pre-anesthesia Checklist: Patient identified, Patient being monitored, Timeout performed, Emergency Drugs available and Suction available Patient Re-evaluated:Patient Re-evaluated prior to induction Oxygen Delivery Method: Circle system utilized Preoxygenation: Pre-oxygenation with 100% oxygen Induction Type: IV induction Ventilation: Mask ventilation without difficulty Laryngoscope Size: Mac and 3 Grade View: Grade II Tube type: Oral Tube size: 7.5 mm Number of attempts: 1 Airway Equipment and Method: Stylet Placement Confirmation: ETT inserted through vocal cords under direct vision,  positive ETCO2 and breath sounds checked- equal and bilateral Secured at: 22 cm Tube secured with: Tape Dental Injury: Teeth and Oropharynx as per pre-operative assessment  Difficulty Due To: Difficult Airway- due to anterior larynx

## 2018-03-13 NOTE — Op Note (Signed)
[facility] Patient Name: Jose [name] Procedure Date: 03/13/2018 2:40 PM MRN: [id number] Account #: [id number] Date of Birth: [birth year] Admit Type: Inpatient Age: 84 Room: [facility] ENDO ROOM 4 Gender: Male Note Status: Finalized Procedure:            ERCP Indications:          Ascending cholangitis, Jaundice Providers:            Lucilla Lame MD, MD Referring MD:         No Local Md, MD (Referring MD) Medicines:            General Anesthesia Complications:        No immediate complications. Procedure:            Pre-Anesthesia Assessment:                       - Prior to the procedure, a History and Physical was                        performed, and patient medications and allergies were                        reviewed. The patient's tolerance of previous                        anesthesia was also reviewed. The risks and benefits of                        the procedure and the sedation options and risks were                        discussed with the patient. All questions were                        answered, and informed consent was obtained. Prior                        Anticoagulants: The patient has taken no previous                        anticoagulant or antiplatelet agents. ASA Grade                        Assessment: III - A patient with severe systemic                        disease. After reviewing the risks and benefits, the                        patient was deemed in satisfactory condition to undergo                        the procedure.                       After obtaining informed consent, the scope was passed                        under direct vision. Throughout the procedure, the  patient's blood pressure, pulse, and oxygen saturations                        were monitored continuously. The Duodenoscope was                        introduced through the mouth, and used to inject   contrast into and used to inject contrast into the bile                        duct. The ERCP was accomplished without difficulty. The                        patient tolerated the procedure well. Findings:      A scout film of the abdomen was obtained. Surgical clips, consistent       with previous cholecystectomy, were seen in the area of the cystic duct.       The esophagus was successfully intubated under direct vision. The scope       was advanced to a normal major papilla in the descending duodenum       without detailed examination of the pharynx, larynx and associated       structures, and upper GI tract. The upper GI tract was grossly normal.       The bile duct was deeply cannulated with the short-nosed traction       sphincterotome. Contrast was injected. I personally interpreted the bile       duct images. There was brisk flow of contrast through the ducts. Image       quality was excellent. Contrast extended to the entire biliary tree. The       lower third of the main bile duct contained a single segmental stenosis       5 mm in length. A wire was passed into the biliary tree. A 5 mm biliary       sphincterotomy was made with a traction (standard) sphincterotome using       ERBE electrocautery. There was no post-sphincterotomy bleeding. One 10       Fr by 9 cm plastic stent with a single external flap and a single       internal flap was placed 7 cm into the common bile duct. Bile flowed       through the stent. The stent was in good position. Impression:           - A single segmental biliary stricture was found in the                        lower third of the main bile duct. The stricture was                        inflammatory.                       - A biliary sphincterotomy was performed.                       - One plastic stent was placed into the common bile                        duct. Recommendation:       - Return  patient to hospital ward for ongoing care.                        - Clear liquid diet today.                       - Repeat ERCP in 2 months to remove stent. Procedure Code(s):    --- Professional ---                       [phone], Endoscopic retrograde cholangiopancreatography                        (ERCP); with placement of endoscopic stent into biliary                        or pancreatic duct, including pre- and post-dilation                        and guide wire passage, when performed, including                        sphincterotomy, when performed, each stent                       [id number], Endoscopic catheterization of the biliary ductal                        system, radiological supervision and interpretation Diagnosis Code(s):    --- Professional ---                       R17, Unspecified jaundice                       K83.1, Obstruction of bile duct                       K83.09, Other cholangitis CPT copyright 2018 American Medical Association. All rights reserved. The codes documented in this report are preliminary and upon coder review may  be revised to meet current compliance requirements. Lucilla Lame MD, MD 03/13/2018 3:51:30 PM This report has been signed electronically. Number of Addenda: 0 Note Initiated On: 03/13/2018 2:40 PM      [facility]

## 2018-03-13 NOTE — Anesthesia Post-op Follow-up Note (Addendum)
Anesthesia QCDR form completed.        

## 2018-03-13 NOTE — Progress Notes (Signed)
Physical Therapy  Evaluation re-attempted.  Patient now off unit for endoscopy.  Will re-attempt next date as medically appropriate and available  Briena Swingler H. Owens Shark, PT, DPT, NCS 03/13/18, 2:11 PM [phone]

## 2018-03-13 NOTE — Consult Note (Signed)
Jose [name] , MD [address], [place], [place], [place], [address] [address], [place], [place], [place], [address] Phone: [phone]  Fax: [phone]  Consultation  Referring Provider:   Dr Posey Pronto  Primary Care Physician:  [name], MD Primary Gastroenterologist:  None          Reason for Consultation:     Abnormal LFT's   Date of Admission:  03/11/2018 Date of Consultation:  03/13/2018         HPI:   Jose [name] is a 84 y.o. male history of laparoscopic cholecystectomy in 2019.  He did have a percutaneous cholecystostomy tube placed in the gallbladder in 01/06/2018.  Was admitted to the hospital on 03/12/2018 with weakness.  On admission the liver function tests were significantly deranged with alkaline phosphatase of 796, total bilirubin of 4.2 with direct component of 2.4.  1 month back the total bilirubin was 0.7 with an alkaline phosphatase of 159.  Lipase on admission was normal at 29.  Culture shows E. Coli.  Imaging CT scan of the abdomen on 03/11/2018: Chronic extrahepatic biliary dilation.  Sigmoid diverticular disease.  Indistinct appearance of pancreas with mild surrounding edema stranding previous of acute pancreatitis.  Ultrasound right upper quadrant on 03/12/2018: Intra-and extrahepatic biliary dilation.  Post cystectomy state.  Common bile duct dilated 11 mm.  MRCP on 03/12/2018 shows enlarged and inflamed pancreas consistent with acute pancreatitis: Intra-and extrahepatic biliary dilation with a compression of the distal common bile duct in the head of the pancreas likely due to surrounding pancreatitis.   The patient is not a very good historian, family present in the room when I went to see him.  He said that his father has not been feeling well since the beginning of October.  He has had on and off epigastric and left upper quadrant pain.  Denies any fever.  At this point of time is not in any pain or discomfort.  Past Medical History:    Diagnosis Date  . Anxiety   . Back problem 1960  . BPH (benign prostatic hypertrophy)   . Cervical radiculopathy    with neuropathy  . Dyspnea   . GERD (gastroesophageal reflux disease)   . Hypertension   . Hypokalemia   . Melanoma in situ ([place]) 12/2016   neck    Past Surgical History:  Procedure Laterality Date  . CATARACT EXTRACTION     OD  . CHOLECYSTECTOMY N/A 01/26/2018   Procedure: LAPAROSCOPIC CHOLECYSTECTOMY;  Surgeon: Coralie Keens, MD;  Location: [facility];  Service: General;  Laterality: N/A;  . IR PERC CHOLECYSTOSTOMY  12/28/2017  . LAPAROSCOPIC CHOLECYSTECTOMY  01/26/2018  . MELANOMA EXCISION  12/2016   in situ---Dr Dasher  . TONSILLECTOMY      Prior to Admission medications   Medication Sig Start Date End Date Taking? Authorizing Provider  acetaminophen (TYLENOL) 500 MG tablet Take 1,000 mg by mouth every 6 (six) hours as needed.   Yes [provider]  aspirin 81 MG tablet Take 81 mg by mouth daily.     Yes [provider]  gabapentin (NEURONTIN) 600 MG tablet TAKE 1 TABLET (600 MG TOTAL) BY MOUTH 2 (TWO) TIMES DAILY. Patient taking differently: Take 600 mg by mouth 2 (two) times daily.  07/21/17  Yes [name], MD  ketoconazole (NIZORAL) 2 % cream Apply 1 application topically 2 (two) times daily as needed for irritation. 05/07/17  Yes [name], MD  potassium chloride SA (  K-DUR,KLOR-CON) 20 MEQ tablet Take 2 tablets (40 mEq total) by mouth daily. Patient taking differently: Take 40 mEq by mouth 2 (two) times daily.  01/06/18  Yes Viviana Simpler I, MD  tamsulosin (FLOMAX) 0.4 MG CAPS capsule TAKE 1 CAPSULE (0.4 MG TOTAL) BY MOUTH DAILY. Patient taking differently: Take 0.4 mg by mouth daily.  07/21/17  Yes Viviana Simpler I, MD  triamcinolone cream (KENALOG) 0.1 % Apply 1 application topically 2 (two) times daily as needed. Patient taking differently: Apply 1 application topically 2 (two) times daily as needed (irritation).  09/05/16   Yes [name], MD  vitamin B-12 (CYANOCOBALAMIN) 500 MCG tablet Take 500 mcg by mouth daily.   Yes [provider]  ferrous sulfate 325 (65 FE) MG EC tablet Take 325 mg by mouth daily with breakfast.    [provider]    Family History  Problem Relation Age of Onset  . Cancer Son   . Coronary artery disease Neg Hx   . Diabetes Neg Hx      Social History   Tobacco Use  . Smoking status: Never Smoker  . Smokeless tobacco: Never Used  Substance Use Topics  . Alcohol use: No  . Drug use: No    Allergies as of 03/11/2018  . (No Known Allergies)    Review of Systems:    All systems reviewed and negative except where noted in HPI.   Physical Exam:  Vital signs in last 24 hours: Temp:  [97.7 F (36.5 C)-100.5 F (38.1 C)] 98.4 F (36.9 C) (10/25 0946) Pulse Rate:  [74-94] 94 (10/25 0946) Resp:  [16-18] 17 (10/25 0517) BP: (108-152)/(58-95) 152/95 (10/25 0946) SpO2:  [95 %-96 %] 96 % (10/25 0946) Last BM Date: 03/12/18 General:   Pleasant, cooperative in NAD Head:  Normocephalic and atraumatic. Eyes:   No icterus.   Conjunctiva pink. PERRLA. Ears:  Normal auditory acuity. Neck:  Supple; no masses or thyroidomegaly Lungs: Respirations even and unlabored. Lungs clear to auscultation bilaterally.   No wheezes, crackles, or rhonchi.  Heart:  Regular rate and rhythm;  Without murmur, clicks, rubs or gallops Abdomen:  Soft, nondistended, nontender. Normal bowel sounds. No appreciable masses or hepatomegaly.  No rebound or guarding.  Neurologic:  Alert and oriented x2;  grossly normal neurologically. Skin:  Intact without significant lesions or rashes. Cervical Nodes:  No significant cervical adenopathy. Psych:  Alert and cooperative. Normal affect.  LAB RESULTS: Recent Labs    03/11/18 2112 03/13/18 0401  WBC 10.8* 5.4  HGB 10.7* 8.0*  HCT 32.0* 23.7*  PLT 128* 91*   BMET Recent Labs    03/11/18 2112 03/13/18 0401  NA 138 137  K 3.4*  3.6  CL 104 108  CO2 23 21*  GLUCOSE 159* 139*  BUN 19 18  CREATININE 1.39* 0.73  CALCIUM 8.9 8.0*   LFT Recent Labs    03/11/18 2111 03/13/18 0401  PROT 6.6 5.0*  ALBUMIN 3.1* 2.5*  AST 268* 76*  ALT 173* 92*  ALKPHOS 796* 465*  BILITOT 4.2* 2.5*  BILIDIR 2.4*  --   IBILI 1.8*  --    PT/INR No results for input(s): LABPROT, INR in the last 72 hours.  STUDIES: Ct Abdomen Pelvis W Contrast  Result Date: 03/11/2018 CLINICAL DATA:  Increased weakness with generalized abdominal pain EXAM: CT ABDOMEN AND PELVIS WITH CONTRAST TECHNIQUE: Multidetector CT imaging of the abdomen and pelvis was performed using the standard protocol following bolus administration of intravenous contrast.  CONTRAST:  74mL OMNIPAQUE IOHEXOL 300 MG/ML  SOLN COMPARISON:  01/04/2018, 12/27/2017 FINDINGS: Lower chest: Lung bases demonstrate hazy dependent atelectasis. Heart size within normal limits. Hepatobiliary: Intra and extrahepatic biliary dilatation status post cholecystectomy. Common bile duct diameter up to 13 mm. Pancreas: Diffuse ductal dilatation. Hazy indistinct appearance of pancreas consistent with pancreatitis. No organized fluid collection. Spleen: Normal in size without focal abnormality. Adrenals/Urinary Tract: Adrenal glands are normal. Scarring left kidney. Cyst mid left kidney. No hydronephrosis. The bladder is normal Stomach/Bowel: Stomach is nonenlarged. No dilated small bowel. No colon wall thickening. Sigmoid colon diverticula without acute inflammatory process. Mild retained feces at the rectum. Vascular/Lymphatic: Moderate aortic atherosclerosis. No aneurysm. No significantly enlarged lymph nodes. Reproductive: Prostate calcification with slight enlargement. Other: Negative for free air or free fluid. Small fat in the left inguinal canal. Musculoskeletal: Moderate severe diffuse degenerative changes. Trace retrolisthesis L3 on L4, L2 on L3. No acute or suspicious abnormality. IMPRESSION: 1.  Indistinct appearance of pancreas with mild surrounding edema/stranding, suspicious for an acute pancreatitis. No organized fluid collection. 2. Interval cholecystectomy with development of intra and extrahepatic biliary enlargement which may be correlated with LFTs. 3. Sigmoid colon diverticular disease without acute inflammatory process Electronically Signed   By: Donavan Foil M.D.   On: 03/11/2018 23:09   Mr Abdomen Mrcp Wo Contrast  Result Date: 03/12/2018 CLINICAL DATA:  Abdominal pain.  Suspected pancreatitis. EXAM: MRI ABDOMEN WITHOUT CONTRAST  (INCLUDING MRCP) TECHNIQUE: Multiplanar multisequence MR imaging of the abdomen was performed. Heavily T2-weighted images of the biliary and pancreatic ducts were obtained, and three-dimensional MRCP images were rendered by post processing. COMPARISON:  CT scan 03/11/2018 FINDINGS: Lower chest: The lung bases are clear of an acute process. No pleural or pericardial effusion. Hepatobiliary: Moderate intra and extrahepatic biliary dilatation. The common bile duct measures up to 13 mm in the porta hepatis but narrows quickly in the pancreatic head. No focal hepatic lesions. Gallbladder is surgically absent. Pancreas: The pancreas is enlarged and inflamed consistent with acute pancreatitis. Abrupt caliber change of the pancreatic duct in the pancreatic head likely due to inflammation. No masses identified. Spleen:  Normal size.  No focal lesions. Adrenals/Urinary Tract: The adrenal glands and kidneys are grossly normal. Small left renal cysts are noted. Stomach/Bowel: Visualized portions within the abdomen are unremarkable. Vascular/Lymphatic: No pathologically enlarged lymph nodes identified. No abdominal aortic aneurysm demonstrated. Other:  No ascites or abdominal wall hernia. Musculoskeletal: No significant bony findings. IMPRESSION: 1. Enlarged and inflamed pancreas consistent with acute pancreatitis. 2. Intra and extrahepatic biliary dilatation with compression  of the distal common bile duct in the head of the pancreas likely due to the surrounding pancreatitis. Similar findings involving the main pancreatic duct. 3. Status post cholecystectomy. 4. Simple appearing renal cysts. Electronically Signed   By: Marijo Sanes M.D.   On: 03/12/2018 19:48   Mr 3d Recon At Scanner  Result Date: 03/12/2018 CLINICAL DATA:  Abdominal pain.  Suspected pancreatitis. EXAM: MRI ABDOMEN WITHOUT CONTRAST  (INCLUDING MRCP) TECHNIQUE: Multiplanar multisequence MR imaging of the abdomen was performed. Heavily T2-weighted images of the biliary and pancreatic ducts were obtained, and three-dimensional MRCP images were rendered by post processing. COMPARISON:  CT scan 03/11/2018 FINDINGS: Lower chest: The lung bases are clear of an acute process. No pleural or pericardial effusion. Hepatobiliary: Moderate intra and extrahepatic biliary dilatation. The common bile duct measures up to 13 mm in the porta hepatis but narrows quickly in the pancreatic head. No focal hepatic lesions. Gallbladder  is surgically absent. Pancreas: The pancreas is enlarged and inflamed consistent with acute pancreatitis. Abrupt caliber change of the pancreatic duct in the pancreatic head likely due to inflammation. No masses identified. Spleen:  Normal size.  No focal lesions. Adrenals/Urinary Tract: The adrenal glands and kidneys are grossly normal. Small left renal cysts are noted. Stomach/Bowel: Visualized portions within the abdomen are unremarkable. Vascular/Lymphatic: No pathologically enlarged lymph nodes identified. No abdominal aortic aneurysm demonstrated. Other:  No ascites or abdominal wall hernia. Musculoskeletal: No significant bony findings. IMPRESSION: 1. Enlarged and inflamed pancreas consistent with acute pancreatitis. 2. Intra and extrahepatic biliary dilatation with compression of the distal common bile duct in the head of the pancreas likely due to the surrounding pancreatitis. Similar findings  involving the main pancreatic duct. 3. Status post cholecystectomy. 4. Simple appearing renal cysts. Electronically Signed   By: Marijo Sanes M.D.   On: 03/12/2018 19:48   Dg Chest Portable 1 View  Result Date: 03/11/2018 CLINICAL DATA:  Weakness 1 week. EXAM: PORTABLE CHEST 1 VIEW COMPARISON:  12/27/2017 FINDINGS: Lungs are adequately inflated without focal consolidation or effusion. Cardiomediastinal silhouette and remainder of the exam is unchanged. IMPRESSION: No active disease. Electronically Signed   By: Marin Olp M.D.   On: 03/11/2018 21:41   US Abdomen Limited Ruq  Result Date: 03/12/2018 CLINICAL DATA:  84 year old with pancreatitis. Cholecystectomy last month. Elevated LFTs and bilirubin. EXAM: ULTRASOUND ABDOMEN LIMITED RIGHT UPPER QUADRANT COMPARISON:  Abdominal CT yesterday. FINDINGS: Gallbladder: Surgically absent.  No fluid collection in the gallbladder fossa. Common bile duct: Diameter: Dilated at 11 mm.  Distal CBD is obscured. Liver: Intrahepatic biliary ductal dilatation. No focal lesion identified. Within normal limits in parenchymal echogenicity. Portal vein is patent on color Doppler imaging with normal direction of blood flow towards the liver. IMPRESSION: 1. Intra and extrahepatic biliary ductal dilatation, seen on CT. The distal common bile duct is obscured. No choledocholithiasis were visualized. 2. Postcholecystectomy. Electronically Signed   By: Keith Rake M.D.   On: 03/12/2018 01:22      Impression / Plan:   Jose [name] is a 84 y.o. y/o male status post cholecystectomy admitted with weakness.  Bacteremia, abnormal liver function test suggestive of an obstructive pattern imaging suggestive of possible acute pancreatitis but  lipase is within normal limits . There is intra-and extrahepatic biliary dilation with a compression of the distal common bile duct at the head of the pancreas which has been deemed likely secondary to pancreatitis based on imaging.   With an elevated bilirubin, bacteremia it would be important for the obstruction to be relieved by an ERCP.  Plan 1.  ERCP today .  Continue IV antibiotics 2.  He is anemic monitor CBC and transfuse if needed and watch for any evidence of bleeding. 3.  Stat INR  I have discussed alternative options, risks & benefits,  which include, but are not limited to, bleeding, infection, perforation,respiratory complication,drug reaction, acute pancreatitis anddeath .  The patient agrees with this plan & written consent will be obtained.      Thank you for involving me in the care of this patient.      LOS: 1 day   Jose Bellows, MD  03/13/2018, 12:56 PM

## 2018-03-13 NOTE — Progress Notes (Signed)
[name] [facility]                                                                                                                                                                                  Patient Demographics   Jose [name], is a 84 y.o. male, DOB - December 14, 1923, ZOX:[id number]  Admit date - 03/11/2018   Admitting Physician Arta Silence, MD  Outpatient Primary MD for the patient is [name], MD   LOS - 1  Subjective: Patient continues to have abdominal pain   Review of Systems:   CONSTITUTIONAL: No documented fever. No fatigue, weakness. No weight gain, no weight loss.  EYES: No blurry or double vision.  ENT: No tinnitus. No postnasal drip. No redness of the oropharynx.  RESPIRATORY: No cough, no wheeze, no hemoptysis. No dyspnea.  CARDIOVASCULAR: No chest pain. No orthopnea. No palpitations. No syncope.  GASTROINTESTINAL: No nausea, no vomiting or diarrhea.  Positive abdominal pain. No melena or hematochezia.  GENITOURINARY: No dysuria or hematuria.  ENDOCRINE: No polyuria or nocturia. No heat or cold intolerance.  HEMATOLOGY: No anemia. No bruising. No bleeding.  INTEGUMENTARY: No rashes. No lesions.  MUSCULOSKELETAL: No arthritis. No swelling. No gout.  NEUROLOGIC: No numbness, tingling, or ataxia. No seizure-type activity.  PSYCHIATRIC: No anxiety. No insomnia. No ADD.    Vitals:   Vitals:   03/13/18 0517 03/13/18 0946 03/13/18 1407 03/13/18 1558  BP: 125/64 (!) 152/95 134/69 134/72  Pulse: 74 94 (!) 127 87  Resp: 17  20 18   Temp: 98.3 F (36.8 C) 98.4 F (36.9 C) (!) 102 F (38.9 C) 99.4 F (37.4 C)  TempSrc: Oral Oral Tympanic   SpO2: 96% 96% 93% 100%  Weight:      Height:   5\' 7"  (1.702 m)     Wt Readings from Last 3 Encounters:  03/12/18 56.9 kg  02/12/18 61.2 kg  01/27/18 61.1 kg     Intake/Output Summary (Last 24 hours) at 03/13/2018 1600 Last data filed at 03/13/2018 1555 Gross per 24 hour   Intake 1031.59 ml  Output 760 ml  Net 271.59 ml    Physical Exam:   GENERAL: Pleasant-appearing in no apparent distress.  HEAD, EYES, EARS, NOSE AND THROAT: Atraumatic, normocephalic. Extraocular muscles are intact. Pupils equal and reactive to light. Sclerae anicteric. No conjunctival injection. No oro-pharyngeal erythema.  NECK: Supple. There is no jugular venous distention. No bruits, no lymphadenopathy, no thyromegaly.  HEART: Regular rate and rhythm,. No murmurs, no rubs, no clicks.  LUNGS: Clear to auscultation bilaterally. No rales or rhonchi. No wheezes.  ABDOMEN: Soft, flat, mid epigastric left upper quadrant tenderness, nondistended. Has  good bowel sounds. No hepatosplenomegaly appreciated.  EXTREMITIES: No evidence of any cyanosis, clubbing, or peripheral edema.  +2 pedal and radial pulses bilaterally.  NEUROLOGIC: The patient is alert, awake,  with no focal motor or sensory deficits appreciated bilaterally.  SKIN: Moist and warm with no rashes appreciated.  Psych: Not anxious, depressed LN: No inguinal LN enlargement    Antibiotics   Anti-infectives (From admission, onward)   Start     Dose/Rate Route Frequency Ordered Stop   03/12/18 2300  ceFEPIme (MAXIPIME) 1 g in sodium chloride 0.9 % 100 mL IVPB  Status:  Discontinued     1 g 200 mL/hr over 30 Minutes Intravenous Every 24 hours 03/12/18 0305 03/12/18 1719   03/12/18 1800  vancomycin (VANCOCIN) IVPB 750 mg/150 ml premix  Status:  Discontinued     750 mg 150 mL/hr over 60 Minutes Intravenous Every 24 hours 03/12/18 0305 03/12/18 1426   03/12/18 1730  [MAR Hold]  meropenem (MERREM) 1 g in sodium chloride 0.9 % 100 mL IVPB     (MAR Hold since Fri 03/13/2018 at 1404. Reason: Transfer to a Procedural area.)   1 g 200 mL/hr over 30 Minutes Intravenous Every 12 hours 03/12/18 1719     03/11/18 2330  ceFEPIme (MAXIPIME) 2 g in sodium chloride 0.9 % 100 mL IVPB     2 g 200 mL/hr over 30 Minutes Intravenous  Once 03/11/18  2318 03/12/18 0027   03/11/18 2330  metroNIDAZOLE (FLAGYL) IVPB 500 mg  Status:  Discontinued     500 mg 100 mL/hr over 60 Minutes Intravenous Every 8 hours 03/11/18 2318 03/13/18 0953   03/11/18 2330  vancomycin (VANCOCIN) IVPB 1000 mg/200 mL premix     1,000 mg 200 mL/hr over 60 Minutes Intravenous  Once 03/11/18 2318 03/12/18 0201      Medications   Scheduled Meds: . [MAR Hold] aspirin EC  81 mg Oral Daily  . [MAR Hold] feeding supplement (ENSURE ENLIVE)  237 mL Oral BID BM  . [MAR Hold] ferrous sulfate  325 mg Oral Q breakfast  . [MAR Hold] gabapentin  600 mg Oral BID  . [MAR Hold] heparin injection (subcutaneous)  5,000 Units Subcutaneous Q8H  . [MAR Hold] multivitamin with minerals  1 tablet Oral Daily  . [MAR Hold] tamsulosin  0.4 mg Oral QPC supper  . [MAR Hold] vitamin B-12  500 mcg Oral Daily   Continuous Infusions: . sodium chloride 20 mL/hr at 03/13/18 1415  . lactated ringers 75 mL/hr at 03/13/18 0720  . [MAR Hold] meropenem (MERREM) IV 1 g (03/13/18 0504)   PRN Meds:.[MAR Hold] acetaminophen **OR** [MAR Hold] acetaminophen, [MAR Hold] bisacodyl, fentaNYL (SUBLIMAZE) injection, [MAR Hold]  morphine injection, [MAR Hold] ondansetron **OR** [MAR Hold] ondansetron (ZOFRAN) IV, ondansetron (ZOFRAN) IV, [MAR Hold] senna-docusate   Data Review:   Micro Results Recent Results (from the past 240 hour(s))  Blood culture (routine x 2)     Status: Abnormal (Preliminary result)   Collection Time: 03/11/18  9:11 PM  Result Value Ref Range Status   Specimen Description   Final    BLOOD BLOOD RIGHT FOREARM Performed at [facility], [address], [place], [place] [address]    Special Requests   Final    BOTTLES DRAWN AEROBIC AND ANAEROBIC Blood Culture adequate volume Performed at [facility], [address], [place], [place] [address]    Culture  Setup Time   Final    GRAM NEGATIVE RODS AEROBIC BOTTLE ONLY  CRITICAL RESULT CALLED TO, READ BACK BY  AND VERIFIED WITH: JASON ROBBINS 03/12/18 1616 KLW    Culture (A)  Final    ESCHERICHIA COLI SUSCEPTIBILITIES TO FOLLOW Performed at [facility], [place] [address], [place], [place] [address]    Report Status PENDING  Incomplete  Blood culture (routine x 2)     Status: None (Preliminary result)   Collection Time: 03/11/18  9:11 PM  Result Value Ref Range Status   Specimen Description   Final    BLOOD BLOOD LEFT FOREARM Performed at [facility], [address], [place], [place] [address]    Special Requests   Final    BOTTLES DRAWN AEROBIC AND ANAEROBIC Blood Culture adequate volume Performed at [facility], [address] [place], [place] [address]    Culture  Setup Time   Final    AEROBIC BOTTLE ONLY GRAM NEGATIVE RODS CRITICAL RESULT CALLED TO, READ BACK BY AND VERIFIED WITH: JASON ROBBINS 03/12/18 1616 KLW Performed at [facility], [address], [place], [place] [address]    Culture   Final    Lonell Grandchild NEGATIVE RODS IDENTIFICATION AND SUSCEPTIBILITIES TO FOLLOW Performed at [facility], [place] [address], [place], [place] [address]    Report Status PENDING  Incomplete  Blood Culture ID Panel (Reflexed)     Status: Abnormal   Collection Time: 03/11/18  9:11 PM  Result Value Ref Range Status   Enterococcus species NOT DETECTED NOT DETECTED Final   Listeria monocytogenes NOT DETECTED NOT DETECTED Final   Staphylococcus species NOT DETECTED NOT DETECTED Final   Staphylococcus aureus (BCID) NOT DETECTED NOT DETECTED Final   Streptococcus species NOT DETECTED NOT DETECTED Final   Streptococcus agalactiae NOT DETECTED NOT DETECTED Final   Streptococcus pneumoniae NOT DETECTED NOT DETECTED Final   Streptococcus pyogenes NOT DETECTED NOT DETECTED Final   Acinetobacter baumannii NOT DETECTED NOT DETECTED Final   Enterobacteriaceae species DETECTED (A) NOT DETECTED Final    Comment: Enterobacteriaceae represent a large family of  gram-negative bacteria, not a single organism. CRITICAL RESULT CALLED TO, READ BACK BY AND VERIFIED WITH: JASON ROBBINS 03/12/18 1616 KLW    Enterobacter cloacae complex NOT DETECTED NOT DETECTED Final   Escherichia coli DETECTED (A) NOT DETECTED Final    Comment: CRITICAL RESULT CALLED TO, READ BACK BY AND VERIFIED WITH: JASON ROBBINS 03/12/18 1616 KLW    Klebsiella oxytoca NOT DETECTED NOT DETECTED Final   Klebsiella pneumoniae NOT DETECTED NOT DETECTED Final   Proteus species NOT DETECTED NOT DETECTED Final   Serratia marcescens NOT DETECTED NOT DETECTED Final   Carbapenem resistance NOT DETECTED NOT DETECTED Final   Haemophilus influenzae NOT DETECTED NOT DETECTED Final   Neisseria meningitidis NOT DETECTED NOT DETECTED Final   Pseudomonas aeruginosa NOT DETECTED NOT DETECTED Final   Candida albicans NOT DETECTED NOT DETECTED Final   Candida glabrata NOT DETECTED NOT DETECTED Final   Candida krusei NOT DETECTED NOT DETECTED Final   Candida parapsilosis NOT DETECTED NOT DETECTED Final   Candida tropicalis NOT DETECTED NOT DETECTED Final    Comment: Performed at [facility], [address], [place], [place] [address]    Radiology Reports Ct Abdomen Pelvis W Contrast  Result Date: 03/11/2018 CLINICAL DATA:  Increased weakness with generalized abdominal pain EXAM: CT ABDOMEN AND PELVIS WITH CONTRAST TECHNIQUE: Multidetector CT imaging of the abdomen and pelvis was performed using the standard protocol following bolus administration of intravenous contrast. CONTRAST:  47mL OMNIPAQUE  IOHEXOL 300 MG/ML  SOLN COMPARISON:  01/04/2018, 12/27/2017 FINDINGS: Lower chest: Lung bases demonstrate hazy dependent atelectasis. Heart size within normal limits. Hepatobiliary: Intra and extrahepatic biliary dilatation status post cholecystectomy. Common bile duct diameter up to 13 mm. Pancreas: Diffuse ductal dilatation. Hazy indistinct appearance of pancreas consistent with pancreatitis. No  organized fluid collection. Spleen: Normal in size without focal abnormality. Adrenals/Urinary Tract: Adrenal glands are normal. Scarring left kidney. Cyst mid left kidney. No hydronephrosis. The bladder is normal Stomach/Bowel: Stomach is nonenlarged. No dilated small bowel. No colon wall thickening. Sigmoid colon diverticula without acute inflammatory process. Mild retained feces at the rectum. Vascular/Lymphatic: Moderate aortic atherosclerosis. No aneurysm. No significantly enlarged lymph nodes. Reproductive: Prostate calcification with slight enlargement. Other: Negative for free air or free fluid. Small fat in the left inguinal canal. Musculoskeletal: Moderate severe diffuse degenerative changes. Trace retrolisthesis L3 on L4, L2 on L3. No acute or suspicious abnormality. IMPRESSION: 1. Indistinct appearance of pancreas with mild surrounding edema/stranding, suspicious for an acute pancreatitis. No organized fluid collection. 2. Interval cholecystectomy with development of intra and extrahepatic biliary enlargement which may be correlated with LFTs. 3. Sigmoid colon diverticular disease without acute inflammatory process Electronically Signed   By: Donavan Foil M.D.   On: 03/11/2018 23:09   Mr Abdomen Mrcp Wo Contrast  Result Date: 03/12/2018 CLINICAL DATA:  Abdominal pain.  Suspected pancreatitis. EXAM: MRI ABDOMEN WITHOUT CONTRAST  (INCLUDING MRCP) TECHNIQUE: Multiplanar multisequence MR imaging of the abdomen was performed. Heavily T2-weighted images of the biliary and pancreatic ducts were obtained, and three-dimensional MRCP images were rendered by post processing. COMPARISON:  CT scan 03/11/2018 FINDINGS: Lower chest: The lung bases are clear of an acute process. No pleural or pericardial effusion. Hepatobiliary: Moderate intra and extrahepatic biliary dilatation. The common bile duct measures up to 13 mm in the porta hepatis but narrows quickly in the pancreatic head. No focal hepatic lesions.  Gallbladder is surgically absent. Pancreas: The pancreas is enlarged and inflamed consistent with acute pancreatitis. Abrupt caliber change of the pancreatic duct in the pancreatic head likely due to inflammation. No masses identified. Spleen:  Normal size.  No focal lesions. Adrenals/Urinary Tract: The adrenal glands and kidneys are grossly normal. Small left renal cysts are noted. Stomach/Bowel: Visualized portions within the abdomen are unremarkable. Vascular/Lymphatic: No pathologically enlarged lymph nodes identified. No abdominal aortic aneurysm demonstrated. Other:  No ascites or abdominal wall hernia. Musculoskeletal: No significant bony findings. IMPRESSION: 1. Enlarged and inflamed pancreas consistent with acute pancreatitis. 2. Intra and extrahepatic biliary dilatation with compression of the distal common bile duct in the head of the pancreas likely due to the surrounding pancreatitis. Similar findings involving the main pancreatic duct. 3. Status post cholecystectomy. 4. Simple appearing renal cysts. Electronically Signed   By: Marijo Sanes M.D.   On: 03/12/2018 19:48   Mr 3d Recon At Scanner  Result Date: 03/12/2018 CLINICAL DATA:  Abdominal pain.  Suspected pancreatitis. EXAM: MRI ABDOMEN WITHOUT CONTRAST  (INCLUDING MRCP) TECHNIQUE: Multiplanar multisequence MR imaging of the abdomen was performed. Heavily T2-weighted images of the biliary and pancreatic ducts were obtained, and three-dimensional MRCP images were rendered by post processing. COMPARISON:  CT scan 03/11/2018 FINDINGS: Lower chest: The lung bases are clear of an acute process. No pleural or pericardial effusion. Hepatobiliary: Moderate intra and extrahepatic biliary dilatation. The common bile duct measures up to 13 mm in the porta hepatis but narrows quickly in the pancreatic head. No focal hepatic lesions. Gallbladder is surgically absent. Pancreas:  The pancreas is enlarged and inflamed consistent with acute pancreatitis. Abrupt  caliber change of the pancreatic duct in the pancreatic head likely due to inflammation. No masses identified. Spleen:  Normal size.  No focal lesions. Adrenals/Urinary Tract: The adrenal glands and kidneys are grossly normal. Small left renal cysts are noted. Stomach/Bowel: Visualized portions within the abdomen are unremarkable. Vascular/Lymphatic: No pathologically enlarged lymph nodes identified. No abdominal aortic aneurysm demonstrated. Other:  No ascites or abdominal wall hernia. Musculoskeletal: No significant bony findings. IMPRESSION: 1. Enlarged and inflamed pancreas consistent with acute pancreatitis. 2. Intra and extrahepatic biliary dilatation with compression of the distal common bile duct in the head of the pancreas likely due to the surrounding pancreatitis. Similar findings involving the main pancreatic duct. 3. Status post cholecystectomy. 4. Simple appearing renal cysts. Electronically Signed   By: Marijo Sanes M.D.   On: 03/12/2018 19:48   Dg Chest Portable 1 View  Result Date: 03/11/2018 CLINICAL DATA:  Weakness 1 week. EXAM: PORTABLE CHEST 1 VIEW COMPARISON:  12/27/2017 FINDINGS: Lungs are adequately inflated without focal consolidation or effusion. Cardiomediastinal silhouette and remainder of the exam is unchanged. IMPRESSION: No active disease. Electronically Signed   By: Marin Olp M.D.   On: 03/11/2018 21:41   US Abdomen Limited Ruq  Result Date: 03/12/2018 CLINICAL DATA:  84 year old with pancreatitis. Cholecystectomy last month. Elevated LFTs and bilirubin. EXAM: ULTRASOUND ABDOMEN LIMITED RIGHT UPPER QUADRANT COMPARISON:  Abdominal CT yesterday. FINDINGS: Gallbladder: Surgically absent.  No fluid collection in the gallbladder fossa. Common bile duct: Diameter: Dilated at 11 mm.  Distal CBD is obscured. Liver: Intrahepatic biliary ductal dilatation. No focal lesion identified. Within normal limits in parenchymal echogenicity. Portal vein is patent on color Doppler imaging  with normal direction of blood flow towards the liver. IMPRESSION: 1. Intra and extrahepatic biliary ductal dilatation, seen on CT. The distal common bile duct is obscured. No choledocholithiasis were visualized. 2. Postcholecystectomy. Electronically Signed   By: Keith Rake M.D.   On: 03/12/2018 01:22     CBC Recent Labs  Lab 03/11/18 2112 03/13/18 0401 03/13/18 1316  WBC 10.8* 5.4 5.2  HGB 10.7* 8.0* 9.6*  HCT 32.0* 23.7* 28.4*  PLT 128* 91* 99*  MCV 100.3* 99.6 99.0  MCH 33.5 33.6 33.4  MCHC 33.4 33.8 33.8  RDW 14.1 14.4 14.4  LYMPHSABS  --   --  0.3*  MONOABS  --   --  0.3  EOSABS  --   --  0.1  BASOSABS  --   --  0.0    Chemistries  Recent Labs  Lab 03/11/18 2111 03/11/18 2112 03/12/18 0732 03/13/18 0401  NA  --  138  --  137  K  --  3.4*  --  3.6  CL  --  104  --  108  CO2  --  23  --  21*  GLUCOSE  --  159*  --  139*  BUN  --  19  --  18  CREATININE  --  1.39*  --  0.73  CALCIUM  --  8.9  --  8.0*  MG  --   --  2.0  --   AST 268*  --   --  76*  ALT 173*  --   --  92*  ALKPHOS 796*  --   --  465*  BILITOT 4.2*  --   --  2.5*   ------------------------------------------------------------------------------------------------------------------ estimated creatinine clearance is 47.4 mL/min (by C-G formula based on SCr  of 0.73 mg/dL). ------------------------------------------------------------------------------------------------------------------ No results for input(s): HGBA1C in the last 72 hours. ------------------------------------------------------------------------------------------------------------------ No results for input(s): CHOL, HDL, LDLCALC, TRIG, CHOLHDL, LDLDIRECT in the last 72 hours. ------------------------------------------------------------------------------------------------------------------ No results for input(s): TSH, T4TOTAL, T3FREE, THYROIDAB in the last 72 hours.  Invalid input(s):  FREET3 ------------------------------------------------------------------------------------------------------------------ Recent Labs    03/12/18 0732 03/13/18 1316  VITAMINB12 1,358*  --   FOLATE 20.5  --   FERRITIN  --  1,122*  TIBC  --  171*  IRON  --  53    Coagulation profile Recent Labs  Lab 03/13/18 1316  INR 1.45    No results for input(s): DDIMER in the last 72 hours.  Cardiac Enzymes Recent Labs  Lab 03/11/18 2112 03/12/18 0732  TROPONINI 0.08* 0.04*   ------------------------------------------------------------------------------------------------------------------ Invalid input(s): POCBNP    Assessment & Plan  Patient is 84 year old presenting with abdominal pain  1.  Abdominal pain due to acute pancreatitis as well as likely acute cholangitis Status post recent cholecystectomy Continue IV antibiotics Liver function tests are trending down ERCP done had a stricture status post sphincterectomy stent placement  2.  Sepsis with hypotension is due to E. coli related to #1 Continue meropenem  3.  Acute kidney injury due to dehydration improved with IV fluid   4.  Elevated liver function test possibly due to #1 function trending down  5.  Hypertension patient was hypotensive yesterday monitor blood pressure I will discontinue midrodrine that was started yesterday  6.  GERD continue PPI  7.  BPH continue Flomax     Code Status Orders  (From admission, onward)         Start     Ordered   03/12/18 0245  Full code  Continuous     03/12/18 0244        Code Status History    Date Active Date Inactive Code Status Order ID Comments User Context   01/26/2018 1208 01/27/2018 1759 Full Code [id number]  Coralie Keens, MD Inpatient   01/04/2018 0821 01/05/2018 1940 Full Code [id number]  Herbert Pun, MD ED   12/27/2017 1338 12/30/2017 1723 Full Code [id number]  Benjamine Sprague, DO Inpatient       I updated the son at the bedside he requests  patient's iron levels be checked history of low iron in the past    Consults none   DVT Prophylaxis  Lovenox    Lab Results  Component Value Date   PLT 99 (L) 03/13/2018     Time Spent in minutes   40min  Greater than 50% of time spent in care coordination and counseling patient regarding the condition and plan of care.   Dustin Flock M.D on 03/13/2018 at 4:00 PM  Between 7am to 6pm - Pager - [phone]  After 6pm go to [email or web address] - Proofreader  [employer]   Office  [phone]

## 2018-03-13 NOTE — Progress Notes (Signed)
PT Cancellation Note  Patient Details Name: Jose [name] MRN: [id number] DOB: 1924/05/12   Cancelled Treatment:    Reason Eval/Treat Not Completed: Fatigue/lethargy limiting ability to participate(Consult received and chart reviewed.  Patient currently sleeping soundly.  Son at bedside requests therapist hold at this time and re-attempt later this PM.  Will continue efforts at later time/date as medically appropriate and available.)   Abdulrahim Siddiqi H. Owens Shark, PT, DPT, NCS 03/13/18, 11:22 AM [phone]

## 2018-03-13 NOTE — Anesthesia Postprocedure Evaluation (Signed)
Anesthesia Post Note  Patient: Jose [name]  Procedure(s) Performed: ENDOSCOPIC RETROGRADE CHOLANGIOPANCREATOGRAPHY (ERCP) WITH PROPOFOL (N/A )  Patient location during evaluation: Endoscopy Anesthesia Type: General Level of consciousness: awake and alert Pain management: pain level controlled Vital Signs Assessment: post-procedure vital signs reviewed and stable Respiratory status: spontaneous breathing and respiratory function stable Cardiovascular status: stable Anesthetic complications: no     Last Vitals:  Vitals:   03/13/18 1709 03/13/18 1712  BP: 113/61 96/76  Pulse: 77 90  Resp: 20   Temp: 36.8 C   SpO2: 92%     Last Pain:  Vitals:   03/13/18 1709  TempSrc: Oral  PainSc:                  Taiwana Willison K

## 2018-03-13 NOTE — Transfer of Care (Signed)
Immediate Anesthesia Transfer of Care Note  Patient: Jose [name] [facility]  Procedure(s) Performed: ENDOSCOPIC RETROGRADE CHOLANGIOPANCREATOGRAPHY (ERCP) WITH PROPOFOL (N/A )  Patient Location: PACU  Anesthesia Type:General  Level of Consciousness: drowsy  Airway & Oxygen Therapy: Patient Spontanous Breathing and Patient connected to face mask oxygen  Post-op Assessment: Report given to RN and Post -op Vital signs reviewed and stable  Post vital signs: Reviewed and stable  Last Vitals:  Vitals Value Taken Time  BP 134/72 03/13/2018  4:01 PM  Temp 37.4 C 03/13/2018  3:58 PM  Pulse 85 03/13/2018  4:01 PM  Resp 20 03/13/2018  4:01 PM  SpO2 100 % 03/13/2018  4:01 PM    Last Pain:  Vitals:   03/13/18 1407  TempSrc: Tympanic  PainSc: 7          Complications: No apparent anesthesia complications

## 2018-03-13 NOTE — Progress Notes (Signed)
Returned from PACU.  Wanted to stand to urinate.  BP laying 113/61, sitting 96/76.  Told him her could sit at the side of the bed but couldn't stand up.  He was not happy about this but urinated 125 ml.

## 2018-03-13 NOTE — Anesthesia Preprocedure Evaluation (Addendum)
Anesthesia Evaluation  Patient identified by MRN, date of birth, ID band Patient awake    Reviewed: Allergy & Precautions, NPO status , Patient's Chart, lab work & pertinent test results, reviewed documented beta blocker date and time   Airway Mallampati: II  TM Distance: >3 FB     Dental  (+) Chipped, Partial Lower, Upper Dentures   Pulmonary shortness of breath,           Cardiovascular hypertension, Pt. on medications      Neuro/Psych Anxiety  Neuromuscular disease    GI/Hepatic GERD  ,  Endo/Other    Renal/GU      Musculoskeletal  (+) Arthritis ,   Abdominal   Peds  Hematology  (+) anemia ,   Anesthesia Other Findings Runs a low sat 93%. Hypertensive.Hb 9.6.  Reproductive/Obstetrics                            Anesthesia Physical Anesthesia Plan  ASA: III  Anesthesia Plan: General   Post-op Pain Management:    Induction: Intravenous  PONV Risk Score and Plan:   Airway Management Planned: Oral ETT  Additional Equipment:   Intra-op Plan:   Post-operative Plan:   Informed Consent: I have reviewed the patients History and Physical, chart, labs and discussed the procedure including the risks, benefits and alternatives for the proposed anesthesia with the patient or authorized representative who has indicated his/her understanding and acceptance.     Plan Discussed with: CRNA  Anesthesia Plan Comments:         Anesthesia Quick Evaluation

## 2018-03-14 DIAGNOSIS — E440 Moderate protein-calorie malnutrition: Secondary | ICD-10-CM

## 2018-03-14 DIAGNOSIS — K851 Biliary acute pancreatitis without necrosis or infection: Secondary | ICD-10-CM

## 2018-03-14 LAB — COMPREHENSIVE METABOLIC PANEL
ALT: 81 U/L — ABNORMAL HIGH (ref 0–44)
AST: 55 U/L — AB (ref 15–41)
Albumin: 2.8 g/dL — ABNORMAL LOW (ref 3.5–5.0)
Alkaline Phosphatase: 509 U/L — ABNORMAL HIGH (ref 38–126)
Anion gap: 10 (ref 5–15)
BILIRUBIN TOTAL: 1.8 mg/dL — AB (ref 0.3–1.2)
BUN: 12 mg/dL (ref 8–23)
CHLORIDE: 105 mmol/L (ref 98–111)
CO2: 24 mmol/L (ref 22–32)
CREATININE: 0.96 mg/dL (ref 0.61–1.24)
Calcium: 8.3 mg/dL — ABNORMAL LOW (ref 8.9–10.3)
GFR calc Af Amer: >60 mL/min (ref >60)
Glucose, Bld: 139 mg/dL — ABNORMAL HIGH (ref 70–99)
POTASSIUM: 3.8 mmol/L (ref 3.5–5.1)
Sodium: 139 mmol/L (ref 135–145)
TOTAL PROTEIN: 6.1 g/dL — AB (ref 6.5–8.1)

## 2018-03-14 LAB — CULTURE, BLOOD (ROUTINE X 2)
SPECIAL REQUESTS: ADEQUATE
SPECIAL REQUESTS: ADEQUATE

## 2018-03-14 LAB — BASIC METABOLIC PANEL
Anion gap: 12 (ref 5–15)
BUN: 14 mg/dL (ref 8–23)
CALCIUM: 8.5 mg/dL — AB (ref 8.9–10.3)
CO2: 22 mmol/L (ref 22–32)
CREATININE: 0.88 mg/dL (ref 0.61–1.24)
Chloride: 103 mmol/L (ref 98–111)
Glucose, Bld: 171 mg/dL — ABNORMAL HIGH (ref 70–99)
Potassium: 3.8 mmol/L (ref 3.5–5.1)
Sodium: 137 mmol/L (ref 135–145)

## 2018-03-14 LAB — PROCALCITONIN: Procalcitonin: 4.78 ng/mL

## 2018-03-14 MED ORDER — SODIUM CHLORIDE 0.9 % IV SOLN
INTRAVENOUS | Status: DC
  Administered 2018-03-14 – 2018-03-15 (×2): via INTRAVENOUS

## 2018-03-14 MED ORDER — SODIUM CHLORIDE 0.9% FLUSH
3.0000 mL | Freq: Two times a day (BID) | INTRAVENOUS | Status: DC
  Administered 2018-03-14 – 2018-03-18 (×9): 3 mL via INTRAVENOUS

## 2018-03-14 MED ORDER — SODIUM CHLORIDE 0.9 % IV SOLN
INTRAVENOUS | Status: DC | PRN
  Administered 2018-03-14: 500 mL via INTRAVENOUS

## 2018-03-14 MED ORDER — METOPROLOL TARTRATE 25 MG PO TABS
12.5000 mg | ORAL_TABLET | Freq: Two times a day (BID) | ORAL | Status: DC
  Administered 2018-03-14: 12.5 mg via ORAL
  Filled 2018-03-14: qty 1

## 2018-03-14 NOTE — Progress Notes (Signed)
Cephas Darby, MD [address]  [place]  [place], [place] [address]  Main: [phone]  Fax: [phone] Pager: [phone]   Subjective: Status post ERCP yesterday with biliary stent placement LFTs are slowly improving.  Patient reports that his abdominal pain is significantly better in the epigastric region.  However, he reports mild discomfort in the left upper quadrant.,  He is tolerating clear liquid diet, he would like to try some solid food.  He denies nausea, vomiting.   Objective: Vital signs in last 24 hours: Vitals:   03/13/18 1926 03/14/18 0359 03/14/18 0634 03/14/18 0818  BP: 125/69 (!) 175/89  99/68  Pulse: 70 (!) 120  79  Resp: 17 17  18   Temp: 97.6 F (36.4 C) (!) 101.1 F (38.4 C) 98.5 F (36.9 C) 97.8 F (36.6 C)  TempSrc: Oral Oral Oral Oral  SpO2: 96% 91%  98%  Weight:      Height:       Weight change:   Intake/Output Summary (Last 24 hours) at 03/14/2018 1456 Last data filed at 03/14/2018 1215 Gross per 24 hour  Intake 1585 ml  Output 800 ml  Net 785 ml     Exam: Heart:: Regular rate and rhythm or S1S2 present Lungs: normal and clear to auscultation Abdomen: soft, nontender, normal bowel sounds Mild tenderness in the left lower rib cage, pain is reproducible  Lab Results: CBC Latest Ref Rng & Units 03/13/2018 03/13/2018 03/11/2018  WBC 4.0 - 10.5 K/uL 5.2 5.4 10.8(H)  Hemoglobin 13.0 - 17.0 g/dL 9.6(L) 8.0(L) 10.7(L)  Hematocrit 39.0 - 52.0 % 28.4(L) 23.7(L) 32.0(L)  Platelets 150 - 400 K/uL 99(L) 91(L) 128(L)   CMP Latest Ref Rng & Units 03/14/2018 03/13/2018 03/11/2018  Glucose 70 - 99 mg/dL 139(H) 139(H) 159(H)  BUN 8 - 23 mg/dL 12 18 19   Creatinine 0.61 - 1.24 mg/dL 0.96 0.73 1.39(H)  Sodium 135 - 145 mmol/L 139 137 138  Potassium 3.5 - 5.1 mmol/L 3.8 3.6 3.4(L)  Chloride 98 - 111 mmol/L 105 108 104  CO2 22 - 32 mmol/L 24 21(L) 23  Calcium 8.9 - 10.3 mg/dL 8.3(L) 8.0(L) 8.9  Total Protein 6.5 - 8.1 g/dL 6.1(L)  5.0(L) 6.6  Total Bilirubin 0.3 - 1.2 mg/dL 1.8(H) 2.5(H) 4.2(H)  Alkaline Phos 38 - 126 U/L 509(H) 465(H) 796(H)  AST 15 - 41 U/L 55(H) 76(H) 268(H)  ALT 0 - 44 U/L 81(H) 92(H) 173(H)    Micro Results: Recent Results (from the past 240 hour(s))  Blood culture (routine x 2)     Status: Abnormal   Collection Time: 03/11/18  9:11 PM  Result Value Ref Range Status   Specimen Description   Final    BLOOD BLOOD RIGHT FOREARM Performed at [facility], [address] [place], [place] [address]    Special Requests   Final    BOTTLES DRAWN AEROBIC AND ANAEROBIC Blood Culture adequate volume Performed at [facility], [address] [place], [place] [address]    Culture  Setup Time   Final    GRAM NEGATIVE RODS AEROBIC BOTTLE ONLY CRITICAL RESULT CALLED TO, READ BACK BY AND VERIFIED WITH: JASON ROBBINS 03/12/18 1616 KLW Performed at [facility], [place] [address] [place], [place] [address]    Culture (A)  Final    ESCHERICHIA COLI Confirmed Extended Spectrum Beta-Lactamase Producer (ESBL).  In bloodstream infections from ESBL organisms, carbapenems are preferred over piperacillin/tazobactam. They are shown to have a lower  risk of mortality.    Report Status 03/14/2018 FINAL  Final   Organism ID, Bacteria ESCHERICHIA COLI  Final      Susceptibility   Escherichia coli - MIC*    AMPICILLIN >=32 RESISTANT Resistant     CEFAZOLIN >=64 RESISTANT Resistant     CEFEPIME RESISTANT Resistant     CEFTAZIDIME RESISTANT Resistant     CEFTRIAXONE RESISTANT Resistant     CIPROFLOXACIN <=0.25 SENSITIVE Sensitive     GENTAMICIN <=1 SENSITIVE Sensitive     IMIPENEM <=0.25 SENSITIVE Sensitive     TRIMETH/SULFA <=20 SENSITIVE Sensitive     AMPICILLIN/SULBACTAM 4 SENSITIVE Sensitive     PIP/TAZO <=4 SENSITIVE Sensitive     Extended ESBL POSITIVE Resistant     * ESCHERICHIA COLI  Blood culture (routine x 2)     Status: Abnormal   Collection Time: 03/11/18  9:11 PM    Result Value Ref Range Status   Specimen Description   Final    BLOOD BLOOD LEFT FOREARM Performed at [facility], [address], [place], [place] [address]    Special Requests   Final    BOTTLES DRAWN AEROBIC AND ANAEROBIC Blood Culture adequate volume Performed at [facility], [address] [place], [place] [address]    Culture  Setup Time   Final    AEROBIC BOTTLE ONLY GRAM NEGATIVE RODS CRITICAL RESULT CALLED TO, READ BACK BY AND VERIFIED WITH: JASON ROBBINS 03/12/18 1616 KLW Performed at [facility], [place], [place] [address]    Culture (A)  Final    ESCHERICHIA COLI SUSCEPTIBILITIES PERFORMED ON PREVIOUS CULTURE WITHIN THE LAST 5 DAYS. Performed at [facility], [place] [address], [place],  [address]    Report Status 03/14/2018 FINAL  Final  Blood Culture ID Panel (Reflexed)     Status: Abnormal   Collection Time: 03/11/18  9:11 PM  Result Value Ref Range Status   Enterococcus species NOT DETECTED NOT DETECTED Final   Listeria monocytogenes NOT DETECTED NOT DETECTED Final   Staphylococcus species NOT DETECTED NOT DETECTED Final   Staphylococcus aureus (BCID) NOT DETECTED NOT DETECTED Final   Streptococcus species NOT DETECTED NOT DETECTED Final   Streptococcus agalactiae NOT DETECTED NOT DETECTED Final   Streptococcus pneumoniae NOT DETECTED NOT DETECTED Final   Streptococcus pyogenes NOT DETECTED NOT DETECTED Final   Acinetobacter baumannii NOT DETECTED NOT DETECTED Final   Enterobacteriaceae species DETECTED (A) NOT DETECTED Final    Comment: Enterobacteriaceae represent a large family of gram-negative bacteria, not a single organism. CRITICAL RESULT CALLED TO, READ BACK BY AND VERIFIED WITH: JASON ROBBINS 03/12/18 1616 KLW    Enterobacter cloacae complex NOT DETECTED NOT DETECTED Final   Escherichia coli DETECTED (A) NOT DETECTED Final    Comment: CRITICAL RESULT CALLED TO, READ BACK BY AND VERIFIED  WITH: JASON ROBBINS 03/12/18 1616 KLW    Klebsiella oxytoca NOT DETECTED NOT DETECTED Final   Klebsiella pneumoniae NOT DETECTED NOT DETECTED Final   Proteus species NOT DETECTED NOT DETECTED Final   Serratia marcescens NOT DETECTED NOT DETECTED Final   Carbapenem resistance NOT DETECTED NOT DETECTED Final   Haemophilus influenzae NOT DETECTED NOT DETECTED Final   Neisseria meningitidis NOT DETECTED NOT DETECTED Final   Pseudomonas aeruginosa NOT DETECTED NOT DETECTED Final   Candida albicans NOT DETECTED NOT DETECTED Final   Candida glabrata NOT DETECTED NOT DETECTED Final   Candida krusei NOT DETECTED NOT DETECTED Final   Candida parapsilosis  NOT DETECTED NOT DETECTED Final   Candida tropicalis NOT DETECTED NOT DETECTED Final    Comment: Performed at [facility], [address] [place], [place] [address]   Studies/Results: Mr Abdomen Mrcp TM Contrast  Result Date: 03/12/2018 CLINICAL DATA:  Abdominal pain.  Suspected pancreatitis. EXAM: MRI ABDOMEN WITHOUT CONTRAST  (INCLUDING MRCP) TECHNIQUE: Multiplanar multisequence MR imaging of the abdomen was performed. Heavily T2-weighted images of the biliary and pancreatic ducts were obtained, and three-dimensional MRCP images were rendered by post processing. COMPARISON:  CT scan 03/11/2018 FINDINGS: Lower chest: The lung bases are clear of an acute process. No pleural or pericardial effusion. Hepatobiliary: Moderate intra and extrahepatic biliary dilatation. The common bile duct measures up to 13 mm in the porta hepatis but narrows quickly in the pancreatic head. No focal hepatic lesions. Gallbladder is surgically absent. Pancreas: The pancreas is enlarged and inflamed consistent with acute pancreatitis. Abrupt caliber change of the pancreatic duct in the pancreatic head likely due to inflammation. No masses identified. Spleen:  Normal size.  No focal lesions. Adrenals/Urinary Tract: The adrenal glands and kidneys are grossly normal.  Small left renal cysts are noted. Stomach/Bowel: Visualized portions within the abdomen are unremarkable. Vascular/Lymphatic: No pathologically enlarged lymph nodes identified. No abdominal aortic aneurysm demonstrated. Other:  No ascites or abdominal wall hernia. Musculoskeletal: No significant bony findings. IMPRESSION: 1. Enlarged and inflamed pancreas consistent with acute pancreatitis. 2. Intra and extrahepatic biliary dilatation with compression of the distal common bile duct in the head of the pancreas likely due to the surrounding pancreatitis. Similar findings involving the main pancreatic duct. 3. Status post cholecystectomy. 4. Simple appearing renal cysts. Electronically Signed   By: Marijo Sanes M.D.   On: 03/12/2018 19:48   Mr 3d Recon At Scanner  Result Date: 03/12/2018 CLINICAL DATA:  Abdominal pain.  Suspected pancreatitis. EXAM: MRI ABDOMEN WITHOUT CONTRAST  (INCLUDING MRCP) TECHNIQUE: Multiplanar multisequence MR imaging of the abdomen was performed. Heavily T2-weighted images of the biliary and pancreatic ducts were obtained, and three-dimensional MRCP images were rendered by post processing. COMPARISON:  CT scan 03/11/2018 FINDINGS: Lower chest: The lung bases are clear of an acute process. No pleural or pericardial effusion. Hepatobiliary: Moderate intra and extrahepatic biliary dilatation. The common bile duct measures up to 13 mm in the porta hepatis but narrows quickly in the pancreatic head. No focal hepatic lesions. Gallbladder is surgically absent. Pancreas: The pancreas is enlarged and inflamed consistent with acute pancreatitis. Abrupt caliber change of the pancreatic duct in the pancreatic head likely due to inflammation. No masses identified. Spleen:  Normal size.  No focal lesions. Adrenals/Urinary Tract: The adrenal glands and kidneys are grossly normal. Small left renal cysts are noted. Stomach/Bowel: Visualized portions within the abdomen are unremarkable.  Vascular/Lymphatic: No pathologically enlarged lymph nodes identified. No abdominal aortic aneurysm demonstrated. Other:  No ascites or abdominal wall hernia. Musculoskeletal: No significant bony findings. IMPRESSION: 1. Enlarged and inflamed pancreas consistent with acute pancreatitis. 2. Intra and extrahepatic biliary dilatation with compression of the distal common bile duct in the head of the pancreas likely due to the surrounding pancreatitis. Similar findings involving the main pancreatic duct. 3. Status post cholecystectomy. 4. Simple appearing renal cysts. Electronically Signed   By: Marijo Sanes M.D.   On: 03/12/2018 19:48   Dg C-arm 1-60 Min-no Report  Result Date: 03/13/2018 Fluoroscopy was utilized by the requesting physician.  No radiographic interpretation.   Medications: I have reviewed the patient's current medications. Scheduled Meds: .  aspirin EC  81 mg Oral Daily  . feeding supplement (ENSURE ENLIVE)  237 mL Oral BID BM  . ferrous sulfate  325 mg Oral Q breakfast  . gabapentin  600 mg Oral BID  . heparin injection (subcutaneous)  5,000 Units Subcutaneous Q8H  . multivitamin with minerals  1 tablet Oral Daily  . sodium chloride flush  3 mL Intravenous Q12H  . tamsulosin  0.4 mg Oral QPC supper  . vitamin B-12  500 mcg Oral Daily   Continuous Infusions: . 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 Allen Norris as outpatient to remove  plastic stent removal   LOS: 2 days   Arienne Gartin 03/14/2018, 2:56 PM

## 2018-03-14 NOTE — Progress Notes (Signed)
Patient complaining of being cold. Patient shivering in the bed, vital signs taken. BP and HR mildly elevated. Temperature normal. Dr. Leslye Peer paged to notify, verbal orders to closely monitor patient's temperature for fever. Will continue to monitor patient

## 2018-03-14 NOTE — Progress Notes (Signed)
Oral temp is 98.8.  Patient continues to c/o being cold and shivering despite numerous blankets including 2 blankets from warmer and room temp set on 85 degrees.

## 2018-03-14 NOTE — Evaluation (Signed)
Physical Therapy Evaluation Patient Details Name: Jose [name] MRN: [id number] DOB: June 05, 1923 Today's Date: 03/14/2018   History of Present Illness  Patient is a 84 year old male admitted with acute pancreatitis and obstruction of obstruction of bile duct.  Endoscopy performed on 03/13/2018.  PMh includes Htn, BPH, hypokalemia, anxiety, back pain and pt reported chronic knee pain.  Clinical Impression  Patient is a 84 year old male who lives in a one story home alone.  He is a limited community ambulator with use of SPC and is able to perform ADL's independently.  Pt is very HOH but A&O.  He was able to perform bed mobility independently and sit at EOB without assistance.  Pt presented with overall weakness of UE's/Le's and reported no sensation loss.  He was able to stand from bedside with VC's for use of RW and supervision for safety due to pt appearing unsteady on feet.  He ambulated 200 ft with RW, attempting to use only unilateral assist with rail, but proving to be unsafe due to unsteadiness on feet and "knee buckling".  Pt did not report specific pain throughout evaluation but stated that he has chronic knee pain which affects his gait.  He was receptive to all education.  Pt will benefit from skilled PT with focus on strength, fall prevention, Hep and proper use of AD.    Follow Up Recommendations Home health PT;Supervision for mobility/OOB    Equipment Recommendations  None recommended by PT    Recommendations for Other Services       Precautions / Restrictions Precautions Precautions: Fall Restrictions Weight Bearing Restrictions: No      Mobility  Bed Mobility Overal bed mobility: Independent                Transfers Overall transfer level: Needs assistance Equipment used: Rolling walker (2 wheeled) Transfers: Sit to/from Stand Sit to Stand: Supervision         General transfer comment: Able to stand from bedside without physical assist.  Requires  assistance for use of RW.  Pt does appear unsteady on his feet.  Ambulation/Gait Ambulation/Gait assistance: Min guard Gait Distance (Feet): 200 Feet Assistive device: Rolling walker (2 wheeled)     Gait velocity interpretation: 1.31 - 2.62 ft/sec, indicative of limited community ambulator General Gait Details: Moderate foot clearance and step length, VC's for use of RW.  Pt experienced some lateral deviations in gait which he was able to self correct.  Attempted to ambulate with unilateral UE support as though using his cane at home and pt experienced buckling of the knee and required a 5-10 second rest break to refocus.  Stairs            Wheelchair Mobility    Modified Rankin (Stroke Patients Only)       Balance Overall balance assessment: Needs assistance Sitting-balance support: Feet supported Sitting balance-Leahy Scale: Good     Standing balance support: Bilateral upper extremity supported Standing balance-Leahy Scale: Fair Standing balance comment: Unilateral UE support to lift object from floor and reach overhead to simulate opening cabinet door.  Pt able stand statically for 10-15 sec without holding RW but is very unsteady when attempting to walk.                             Pertinent Vitals/Pain Pain Assessment: No/denies pain(Did state that he can "feel something" at surgical site but that he is not in pain.  Reported chronic knee pain bilaterally but did not give a rating or show signs of pain.)    Home Living Family/patient expects to be discharged to:: Private residence Living Arrangements: Alone Available Help at Discharge: Family;Available PRN/intermittently(Pt's son lives close to his home.  He also states that he has a grandson who can help.) Type of Home: House Home Access: Level entry     Home Layout: One level Home Equipment: Walker - 2 wheels;Cane - single point Additional Comments: Pt stated that he has a RW but has been using his  [facility] for home ambulation.    Prior Function Level of Independence: Independent with assistive device(s)               Hand Dominance        Extremity/Trunk Assessment   Upper Extremity Assessment Upper Extremity Assessment: Generalized weakness(Grossly 3+/5.  Good shoulder ROM with no neck pain noted.)    Lower Extremity Assessment Lower Extremity Assessment: Generalized weakness(Grossly 4-/5.  No sensation loss reported.)    Cervical / Trunk Assessment Cervical / Trunk Assessment: Kyphotic  Communication   Communication: HOH  Cognition Arousal/Alertness: Awake/alert Behavior During Therapy: WFL for tasks assessed/performed Overall Cognitive Status: Within Functional Limits for tasks assessed                                 General Comments: Follows commands consistently.      General Comments      Exercises Other Exercises Other Exercises: Education regarding safe use of RW as well as adjusting RW for proper height.  x5 min Other Exercises: Education regarding managing HEP and importance of HH PT to regain strength and balance. x4 min   Assessment/Plan    PT Assessment Patient needs continued PT services  PT Problem List Decreased strength;Decreased balance;Decreased knowledge of use of DME;Decreased safety awareness       PT Treatment Interventions DME instruction;Therapeutic activities;Gait training;Therapeutic exercise;Patient/family education;Balance training;Functional mobility training    PT Goals (Current goals can be found in the Care Plan section)  Acute Rehab PT Goals Patient Stated Goal: To return to household ambulation with use of SPC. PT Goal Formulation: With patient Time For Goal Achievement: 03/28/18 Potential to Achieve Goals: Good    Frequency Min 2X/week   Barriers to discharge        Co-evaluation               AM-PAC PT "6 Clicks" Daily Activity  Outcome Measure Difficulty turning over in bed (including  adjusting bedclothes, sheets and blankets)?: None Difficulty moving from lying on back to sitting on the side of the bed? : A Little Difficulty sitting down on and standing up from a chair with arms (e.g., wheelchair, bedside commode, etc,.)?: A Little Help needed moving to and from a bed to chair (including a wheelchair)?: A Little Help needed walking in hospital room?: A Little Help needed climbing 3-5 steps with a railing? : A Little 6 Click Score: 19    End of Session   Activity Tolerance: Patient tolerated treatment well Patient left: in chair;with chair alarm set;with nursing/sitter in room;with call bell/phone within reach Nurse Communication: Mobility status PT Visit Diagnosis: Unsteadiness on feet (R26.81);Muscle weakness (generalized) (M62.81)    Time: 4332-9518 PT Time Calculation (min) (ACUTE ONLY): 31 min   Charges:   PT Evaluation $PT Eval Low Complexity: 1 Low PT Treatments $Therapeutic Activity: 8-22 mins  Roxanne Gates, PT, DPT  Roxanne Gates 03/14/2018, 8:31 AM

## 2018-03-14 NOTE — Progress Notes (Signed)
Pharmacy Antibiotic Note  Jose [name] is a 84 y.o. male admitted on 03/11/2018 with bacteremia.  Pharmacy has been consulted for Meropenem dosing.  10/23 Bcx= ESBL E.coli  Plan: Meropenem 1 gm IV Q12H  Height: 5\' 7"  (170.2 cm) Weight: 125 lb 6.4 oz (56.9 kg) IBW/kg (Calculated) : 66.1  Temp (24hrs), Avg:99.2 F (37.3 C), Min:97.6 F (36.4 C), Max:102 F (38.9 C)  Recent Labs  Lab 03/11/18 2112 03/11/18 2318 03/13/18 0401 03/13/18 1316 03/14/18 0411  WBC 10.8*  --  5.4 5.2  --   CREATININE 1.39*  --  0.73  --  0.96  LATICACIDVEN 3.5* 1.4  --   --   --     Estimated Creatinine Clearance: 39.5 mL/min (by C-G formula based on SCr of 0.96 mg/dL).    No Known Allergies  Antimicrobials this admission:   >>    >>   Dose adjustments this admission:   Microbiology results:  BCx: ESBL E.coli  UCx:    Sputum:    MRSA PCR:   Thank you for allowing pharmacy to be a part of this patient's care.  Melvina Pangelinan A 03/14/2018 11:09 AM

## 2018-03-14 NOTE — Progress Notes (Signed)
Patient has been tachycardic to 150's for the last 5 or so minutes.  Received no notice from [place] to enquire as to why and was told the tech was busy with another "emergency".

## 2018-03-14 NOTE — Progress Notes (Signed)
Patient ID: Jose [name], male   DOB: [birth year], 84 y.o.   MRN: [id number]  [facility] PROGRESS NOTE  Jose [name] [place] GUR:[id number] DOB: January 14, 1924 DOA: 03/11/2018 PCP: [name], MD  HPI/Subjective: Patient seen this morning and feeling well.  Wanted to know if he can go home this afternoon.  Had a temperature yesterday.  Nurses called me this afternoon that he was having some chills.  Objective: Vitals:   03/14/18 1513 03/14/18 1548  BP:  (!) 158/125  Pulse:  (!) 115  Resp:  (!) 32  Temp: 98.5 F (36.9 C) 98.5 F (36.9 C)  SpO2:  97%    Filed Weights   03/12/18 0253  Weight: 56.9 kg    ROS: Review of Systems  Constitutional: Positive for chills and fever.  Eyes: Negative for blurred vision.  Respiratory: Negative for cough and shortness of breath.   Cardiovascular: Negative for chest pain.  Gastrointestinal: Negative for abdominal pain, constipation, diarrhea, nausea and vomiting.  Genitourinary: Negative for dysuria.  Musculoskeletal: Negative for joint pain.  Neurological: Negative for dizziness and headaches.   Exam: Physical Exam  HENT:  Nose: No mucosal edema.  Mouth/Throat: No oropharyngeal exudate or posterior oropharyngeal edema.  Eyes: Pupils are equal, round, and reactive to light. Conjunctivae, EOM and lids are normal.  Neck: No JVD present. Carotid bruit is not present. No edema present. No thyroid mass and no thyromegaly present.  Cardiovascular: S1 normal and S2 normal. Exam reveals no gallop.  No murmur heard. Pulses:      Dorsalis pedis pulses are 2+ on the right side, and 2+ on the left side.  Respiratory: No respiratory distress. He has no wheezes. He has no rhonchi. He has no rales.  GI: Soft. Bowel sounds are normal. There is no tenderness.  Musculoskeletal:       Right shoulder: He exhibits no swelling.  Lymphadenopathy:    He has no cervical adenopathy.  Neurological: He is alert. No cranial nerve deficit.  Skin:  Skin is warm. No rash noted. Nails show no clubbing.  Psychiatric: He has a normal mood and affect.      Data Reviewed: Basic Metabolic Panel: Recent Labs  Lab 03/11/18 2112 03/12/18 0732 03/13/18 0401 03/14/18 0411  NA 138  --  137 139  K 3.4*  --  3.6 3.8  CL 104  --  108 105  CO2 23  --  21* 24  GLUCOSE 159*  --  139* 139*  BUN 19  --  18 12  CREATININE 1.39*  --  0.73 0.96  CALCIUM 8.9  --  8.0* 8.3*  MG  --  2.0  --   --   PHOS  --  4.0  --   --    Liver Function Tests: Recent Labs  Lab 03/11/18 2111 03/13/18 0401 03/14/18 0411  AST 268* 76* 55*  ALT 173* 92* 81*  ALKPHOS 796* 465* 509*  BILITOT 4.2* 2.5* 1.8*  PROT 6.6 5.0* 6.1*  ALBUMIN 3.1* 2.5* 2.8*   Recent Labs  Lab 03/11/18 2111 03/13/18 0401  LIPASE 29 28   No results for input(s): AMMONIA in the last 168 hours. CBC: Recent Labs  Lab 03/11/18 2112 03/13/18 0401 03/13/18 1316  WBC 10.8* 5.4 5.2  NEUTROABS  --   --  4.5  HGB 10.7* 8.0* 9.6*  HCT 32.0* 23.7* 28.4*  MCV 100.3* 99.6 99.0  PLT 128* 91* 99*   Cardiac Enzymes: Recent Labs  Lab 03/11/18 2112  03/12/18 0732  TROPONINI 0.08* 0.04*   BNP (last 3 results) No results for input(s): BNP in the last 8760 hours.  ProBNP (last 3 results) No results for input(s): PROBNP in the last 8760 hours.  CBG: No results for input(s): GLUCAP in the last 168 hours.  Recent Results (from the past 240 hour(s))  Blood culture (routine x 2)     Status: Abnormal   Collection Time: 03/11/18  9:11 PM  Result Value Ref Range Status   Specimen Description   Final    BLOOD BLOOD RIGHT FOREARM Performed at [facility], [address] [place], [place] [address]    Special Requests   Final    BOTTLES DRAWN AEROBIC AND ANAEROBIC Blood Culture adequate volume Performed at [facility], [address] [place], [place] [address]    Culture  Setup Time   Final    GRAM NEGATIVE RODS AEROBIC BOTTLE ONLY CRITICAL RESULT CALLED  TO, READ BACK BY AND VERIFIED WITH: JASON ROBBINS 03/12/18 1616 KLW Performed at [facility], [place] [address], [place], [place] [address]    Culture (A)  Final    ESCHERICHIA COLI Confirmed Extended Spectrum Beta-Lactamase Producer (ESBL).  In bloodstream infections from ESBL organisms, carbapenems are preferred over piperacillin/tazobactam. They are shown to have a lower risk of mortality.    Report Status 03/14/2018 FINAL  Final   Organism ID, Bacteria ESCHERICHIA COLI  Final      Susceptibility   Escherichia coli - MIC*    AMPICILLIN >=32 RESISTANT Resistant     CEFAZOLIN >=64 RESISTANT Resistant     CEFEPIME RESISTANT Resistant     CEFTAZIDIME RESISTANT Resistant     CEFTRIAXONE RESISTANT Resistant     CIPROFLOXACIN <=0.25 SENSITIVE Sensitive     GENTAMICIN <=1 SENSITIVE Sensitive     IMIPENEM <=0.25 SENSITIVE Sensitive     TRIMETH/SULFA <=20 SENSITIVE Sensitive     AMPICILLIN/SULBACTAM 4 SENSITIVE Sensitive     PIP/TAZO <=4 SENSITIVE Sensitive     Extended ESBL POSITIVE Resistant     * ESCHERICHIA COLI  Blood culture (routine x 2)     Status: Abnormal   Collection Time: 03/11/18  9:11 PM  Result Value Ref Range Status   Specimen Description   Final    BLOOD BLOOD LEFT FOREARM Performed at [facility], [address] [place], [place] [address]    Special Requests   Final    BOTTLES DRAWN AEROBIC AND ANAEROBIC Blood Culture adequate volume Performed at [facility], [address] [place], [place] [address]    Culture  Setup Time   Final    AEROBIC BOTTLE ONLY GRAM NEGATIVE RODS CRITICAL RESULT CALLED TO, READ BACK BY AND VERIFIED WITH: JASON ROBBINS 03/12/18 1616 KLW Performed at [facility], [place], [place] [address]    Culture (A)  Final    ESCHERICHIA COLI SUSCEPTIBILITIES PERFORMED ON PREVIOUS CULTURE WITHIN THE LAST 5 DAYS. Performed at [facility], [place] [address], [place],  [address]     Report Status 03/14/2018 FINAL  Final  Blood Culture ID Panel (Reflexed)     Status: Abnormal   Collection Time: 03/11/18  9:11 PM  Result Value Ref Range Status   Enterococcus species NOT DETECTED NOT DETECTED Final   Listeria monocytogenes NOT DETECTED NOT DETECTED Final   Staphylococcus species NOT DETECTED NOT DETECTED Final   Staphylococcus aureus (BCID) NOT DETECTED NOT DETECTED Final   Streptococcus species  NOT DETECTED NOT DETECTED Final   Streptococcus agalactiae NOT DETECTED NOT DETECTED Final   Streptococcus pneumoniae NOT DETECTED NOT DETECTED Final   Streptococcus pyogenes NOT DETECTED NOT DETECTED Final   Acinetobacter baumannii NOT DETECTED NOT DETECTED Final   Enterobacteriaceae species DETECTED (A) NOT DETECTED Final    Comment: Enterobacteriaceae represent a large family of gram-negative bacteria, not a single organism. CRITICAL RESULT CALLED TO, READ BACK BY AND VERIFIED WITH: JASON ROBBINS 03/12/18 1616 KLW    Enterobacter cloacae complex NOT DETECTED NOT DETECTED Final   Escherichia coli DETECTED (A) NOT DETECTED Final    Comment: CRITICAL RESULT CALLED TO, READ BACK BY AND VERIFIED WITH: JASON ROBBINS 03/12/18 1616 KLW    Klebsiella oxytoca NOT DETECTED NOT DETECTED Final   Klebsiella pneumoniae NOT DETECTED NOT DETECTED Final   Proteus species NOT DETECTED NOT DETECTED Final   Serratia marcescens NOT DETECTED NOT DETECTED Final   Carbapenem resistance NOT DETECTED NOT DETECTED Final   Haemophilus influenzae NOT DETECTED NOT DETECTED Final   Neisseria meningitidis NOT DETECTED NOT DETECTED Final   Pseudomonas aeruginosa NOT DETECTED NOT DETECTED Final   Candida albicans NOT DETECTED NOT DETECTED Final   Candida glabrata NOT DETECTED NOT DETECTED Final   Candida krusei NOT DETECTED NOT DETECTED Final   Candida parapsilosis NOT DETECTED NOT DETECTED Final   Candida tropicalis NOT DETECTED NOT DETECTED Final    Comment: Performed at [facility], [address] [place], [place] [address]     Studies: Mr Abdomen Mrcp Wo Contrast  Result Date: 03/12/2018 CLINICAL DATA:  Abdominal pain.  Suspected pancreatitis. EXAM: MRI ABDOMEN WITHOUT CONTRAST  (INCLUDING MRCP) TECHNIQUE: Multiplanar multisequence MR imaging of the abdomen was performed. Heavily T2-weighted images of the biliary and pancreatic ducts were obtained, and three-dimensional MRCP images were rendered by post processing. COMPARISON:  CT scan 03/11/2018 FINDINGS: Lower chest: The lung bases are clear of an acute process. No pleural or pericardial effusion. Hepatobiliary: Moderate intra and extrahepatic biliary dilatation. The common bile duct measures up to 13 mm in the porta hepatis but narrows quickly in the pancreatic head. No focal hepatic lesions. Gallbladder is surgically absent. Pancreas: The pancreas is enlarged and inflamed consistent with acute pancreatitis. Abrupt caliber change of the pancreatic duct in the pancreatic head likely due to inflammation. No masses identified. Spleen:  Normal size.  No focal lesions. Adrenals/Urinary Tract: The adrenal glands and kidneys are grossly normal. Small left renal cysts are noted. Stomach/Bowel: Visualized portions within the abdomen are unremarkable. Vascular/Lymphatic: No pathologically enlarged lymph nodes identified. No abdominal aortic aneurysm demonstrated. Other:  No ascites or abdominal wall hernia. Musculoskeletal: No significant bony findings. IMPRESSION: 1. Enlarged and inflamed pancreas consistent with acute pancreatitis. 2. Intra and extrahepatic biliary dilatation with compression of the distal common bile duct in the head of the pancreas likely due to the surrounding pancreatitis. Similar findings involving the main pancreatic duct. 3. Status post cholecystectomy. 4. Simple appearing renal cysts. Electronically Signed   By: Marijo Sanes M.D.   On: 03/12/2018 19:48   Mr 3d Recon At Scanner  Result Date: 03/12/2018 CLINICAL  DATA:  Abdominal pain.  Suspected pancreatitis. EXAM: MRI ABDOMEN WITHOUT CONTRAST  (INCLUDING MRCP) TECHNIQUE: Multiplanar multisequence MR imaging of the abdomen was performed. Heavily T2-weighted images of the biliary and pancreatic ducts were obtained, and three-dimensional MRCP images were rendered by post processing. COMPARISON:  CT scan 03/11/2018 FINDINGS: Lower chest: The lung bases are clear of an acute process. No  pleural or pericardial effusion. Hepatobiliary: Moderate intra and extrahepatic biliary dilatation. The common bile duct measures up to 13 mm in the porta hepatis but narrows quickly in the pancreatic head. No focal hepatic lesions. Gallbladder is surgically absent. Pancreas: The pancreas is enlarged and inflamed consistent with acute pancreatitis. Abrupt caliber change of the pancreatic duct in the pancreatic head likely due to inflammation. No masses identified. Spleen:  Normal size.  No focal lesions. Adrenals/Urinary Tract: The adrenal glands and kidneys are grossly normal. Small left renal cysts are noted. Stomach/Bowel: Visualized portions within the abdomen are unremarkable. Vascular/Lymphatic: No pathologically enlarged lymph nodes identified. No abdominal aortic aneurysm demonstrated. Other:  No ascites or abdominal wall hernia. Musculoskeletal: No significant bony findings. IMPRESSION: 1. Enlarged and inflamed pancreas consistent with acute pancreatitis. 2. Intra and extrahepatic biliary dilatation with compression of the distal common bile duct in the head of the pancreas likely due to the surrounding pancreatitis. Similar findings involving the main pancreatic duct. 3. Status post cholecystectomy. 4. Simple appearing renal cysts. Electronically Signed   By: Marijo Sanes M.D.   On: 03/12/2018 19:48   Dg C-arm 1-60 Min-no Report  Result Date: 03/13/2018 Fluoroscopy was utilized by the requesting physician.  No radiographic interpretation.    Scheduled Meds: . aspirin EC  81  mg Oral Daily  . feeding supplement (ENSURE ENLIVE)  237 mL Oral BID BM  . ferrous sulfate  325 mg Oral Q breakfast  . gabapentin  600 mg Oral BID  . heparin injection (subcutaneous)  5,000 Units Subcutaneous Q8H  . multivitamin with minerals  1 tablet Oral Daily  . sodium chloride flush  3 mL Intravenous Q12H  . tamsulosin  0.4 mg Oral QPC supper  . vitamin B-12  500 mcg Oral Daily   Continuous Infusions: . meropenem (MERREM) IV 1 g (03/14/18 5284)    Assessment/Plan:  1. Sepsis with E. coli.  On meropenem.  Monitor temperature curve. 2. Acute cholangitis.  Patient had recent cholecystectomy.  Had ERCP with stent and sphincterotomy yesterday. 3. Acute kidney injury improved with IV fluid hydration 4. Elevated liver function test secondary to acute cholangitis 5. Moderate malnutrition on Ensure 6. BPH on Flomax  Code Status:     Code Status Orders  (From admission, onward)         Start     Ordered   03/12/18 0245  Full code  Continuous     03/12/18 0244        Code Status History    Date Active Date Inactive Code Status Order ID Comments User Context   01/26/2018 1208 01/27/2018 1759 Full Code [id number]  Coralie Keens, MD Inpatient   01/04/2018 0821 01/05/2018 1940 Full Code [id number]  Herbert Pun, MD ED   12/27/2017 1338 12/30/2017 1723 Full Code [id number]  Benjamine Sprague, DO Inpatient    Advance Directive Documentation     Most Recent Value  Type of Advance Directive  Healthcare Power of Attorney, Living will  Pre-existing out of facility DNR order (yellow form or pink MOST form)  -  "MOST" Form in Place?  -     Family Communication: Spoke with son on the phone Disposition Plan: Need to watch temperature curve very closely.  Patient wanted to go home today but since he had a temperature earlier this morning I will not send him home today.  Consultants:  Gastroenterology  Antibiotics:  Meropenem  Time spent: 28 minutes  [place]

## 2018-03-15 DIAGNOSIS — K8309 Other cholangitis: Secondary | ICD-10-CM

## 2018-03-15 LAB — CBC
HCT: 24.8 % — ABNORMAL LOW (ref 39.0–52.0)
Hemoglobin: 8.2 g/dL — ABNORMAL LOW (ref 13.0–17.0)
MCH: 32.9 pg (ref 26.0–34.0)
MCHC: 33.1 g/dL (ref 30.0–36.0)
MCV: 99.6 fL (ref 80.0–100.0)
PLATELETS: 87 10*3/uL — AB (ref 150–400)
RBC: 2.49 MIL/uL — ABNORMAL LOW (ref 4.22–5.81)
RDW: 14.6 % (ref 11.5–15.5)
WBC: 3.7 10*3/uL — ABNORMAL LOW (ref 4.0–10.5)
nRBC: 0 % (ref 0.0–0.2)

## 2018-03-15 LAB — HEPATIC FUNCTION PANEL
ALK PHOS: 377 U/L — AB (ref 38–126)
ALT: 52 U/L — AB (ref 0–44)
ALT: 59 U/L — ABNORMAL HIGH (ref 0–44)
AST: 40 U/L (ref 15–41)
AST: 71 U/L — ABNORMAL HIGH (ref 15–41)
Albumin: 2.3 g/dL — ABNORMAL LOW (ref 3.5–5.0)
Albumin: 2.6 g/dL — ABNORMAL LOW (ref 3.5–5.0)
Alkaline Phosphatase: 399 U/L — ABNORMAL HIGH (ref 38–126)
BILIRUBIN DIRECT: 0.5 mg/dL — AB (ref 0.0–0.2)
Bilirubin, Direct: 0.5 mg/dL — ABNORMAL HIGH (ref 0.0–0.2)
Indirect Bilirubin: 0.9 mg/dL (ref 0.3–0.9)
Indirect Bilirubin: 0.7 mg/dL (ref 0.3–0.9)
TOTAL PROTEIN: 5.3 g/dL — AB (ref 6.5–8.1)
Total Bilirubin: 1.4 mg/dL — ABNORMAL HIGH (ref 0.3–1.2)
Total Bilirubin: 1.2 mg/dL (ref 0.3–1.2)
Total Protein: 5 g/dL — ABNORMAL LOW (ref 6.5–8.1)

## 2018-03-15 MED ORDER — METOPROLOL SUCCINATE ER 25 MG PO TB24
25.0000 mg | ORAL_TABLET | Freq: Every day | ORAL | Status: DC
  Administered 2018-03-15 – 2018-03-18 (×4): 25 mg via ORAL
  Filled 2018-03-15 (×4): qty 1

## 2018-03-15 NOTE — Progress Notes (Addendum)
Jose Darby, MD [address]  [place]  [place], [place] [address]  Main: [phone]  Fax: [phone] Pager: [phone]   Subjective: Patient denies any complaints today.  Patient spiked to 102F yesterday. He denies abdominal pain, nausea.  He is able to have lunch, sitting in bed comfortable.  Objective: Vital signs in last 24 hours: Vitals:   03/14/18 2035 03/15/18 0003 03/15/18 0617 03/15/18 0823  BP: (!) 92/54  (!) 102/52 118/70  Pulse: 94  72 81  Resp:    14  Temp: 98.6 F (37 C) 98.3 F (36.8 C) 98.3 F (36.8 C) 98.4 F (36.9 C)  TempSrc: Oral Oral Oral Oral  SpO2: 95%  95% 96%  Weight:      Height:       Weight change:   Intake/Output Summary (Last 24 hours) at 03/15/2018 1351 Last data filed at 03/15/2018 0634 Gross per 24 hour  Intake 1447.39 ml  Output 350 ml  Net 1097.39 ml     Exam: Heart:: Regular rate and rhythm or S1S2 present Lungs: normal and clear to auscultation Abdomen: soft, nontender, normal bowel sounds Mild tenderness in the left lower rib cage, pain is reproducible  Lab Results: CBC Latest Ref Rng & Units 03/15/2018 03/13/2018 03/13/2018  WBC 4.0 - 10.5 K/uL 3.7(L) 5.2 5.4  Hemoglobin 13.0 - 17.0 g/dL 8.2(L) 9.6(L) 8.0(L)  Hematocrit 39.0 - 52.0 % 24.8(L) 28.4(L) 23.7(L)  Platelets 150 - 400 K/uL 87(L) 99(L) 91(L)   CMP Latest Ref Rng & Units 03/15/2018 03/14/2018 03/14/2018  Glucose 70 - 99 mg/dL - 171(H) 139(H)  BUN 8 - 23 mg/dL - 14 12  Creatinine 0.61 - 1.24 mg/dL - 0.88 0.96  Sodium 135 - 145 mmol/L - 137 139  Potassium 3.5 - 5.1 mmol/L - 3.8 3.8  Chloride 98 - 111 mmol/L - 103 105  CO2 22 - 32 mmol/L - 22 24  Calcium 8.9 - 10.3 mg/dL - 8.5(L) 8.3(L)  Total Protein 6.5 - 8.1 g/dL 5.0(L) - 6.1(L)  Total Bilirubin 0.3 - 1.2 mg/dL 1.4(H) - 1.8(H)  Alkaline Phos 38 - 126 U/L 377(H) - 509(H)  AST 15 - 41 U/L 40 - 55(H)  ALT 0 - 44 U/L 52(H) - 81(H)    Micro Results: Recent Results (from the past 240  hour(s))  Blood culture (routine x 2)     Status: Abnormal   Collection Time: 03/11/18  9:11 PM  Result Value Ref Range Status   Specimen Description   Final    BLOOD BLOOD RIGHT FOREARM Performed at [facility], [address] [place], [place] [address]    Special Requests   Final    BOTTLES DRAWN AEROBIC AND ANAEROBIC Blood Culture adequate volume Performed at [facility], [address], [place], [place] [address]    Culture  Setup Time   Final    GRAM NEGATIVE RODS AEROBIC BOTTLE ONLY CRITICAL RESULT CALLED TO, READ BACK BY AND VERIFIED WITH: JASON ROBBINS 03/12/18 1616 KLW Performed at [facility], [place] [address], [place], [place] [address]    Culture (A)  Final    ESCHERICHIA COLI Confirmed Extended Spectrum Beta-Lactamase Producer (ESBL).  In bloodstream infections from ESBL organisms, carbapenems are preferred over piperacillin/tazobactam. They are shown to have a lower risk of mortality.    Report Status 03/14/2018 FINAL  Final   Organism ID, Bacteria ESCHERICHIA COLI  Final      Susceptibility   Escherichia coli -  MIC*    AMPICILLIN >=32 RESISTANT Resistant     CEFAZOLIN >=64 RESISTANT Resistant     CEFEPIME RESISTANT Resistant     CEFTAZIDIME RESISTANT Resistant     CEFTRIAXONE RESISTANT Resistant     CIPROFLOXACIN <=0.25 SENSITIVE Sensitive     GENTAMICIN <=1 SENSITIVE Sensitive     IMIPENEM <=0.25 SENSITIVE Sensitive     TRIMETH/SULFA <=20 SENSITIVE Sensitive     AMPICILLIN/SULBACTAM 4 SENSITIVE Sensitive     PIP/TAZO <=4 SENSITIVE Sensitive     Extended ESBL POSITIVE Resistant     * ESCHERICHIA COLI  Blood culture (routine x 2)     Status: Abnormal   Collection Time: 03/11/18  9:11 PM  Result Value Ref Range Status   Specimen Description   Final    BLOOD BLOOD LEFT FOREARM Performed at [facility], [address], [place], [place] [address]    Special Requests   Final    BOTTLES DRAWN AEROBIC AND ANAEROBIC Blood  Culture adequate volume Performed at [facility], [address] [place], [place] [address]    Culture  Setup Time   Final    AEROBIC BOTTLE ONLY GRAM NEGATIVE RODS CRITICAL RESULT CALLED TO, READ BACK BY AND VERIFIED WITH: JASON ROBBINS 03/12/18 1616 KLW Performed at [facility], [address] [place], [place] [address]    Culture (A)  Final    ESCHERICHIA COLI SUSCEPTIBILITIES PERFORMED ON PREVIOUS CULTURE WITHIN THE LAST 5 DAYS. Performed at [facility], [place] [address], [place], [place] [address]    Report Status 03/14/2018 FINAL  Final  Blood Culture ID Panel (Reflexed)     Status: Abnormal   Collection Time: 03/11/18  9:11 PM  Result Value Ref Range Status   Enterococcus species NOT DETECTED NOT DETECTED Final   Listeria monocytogenes NOT DETECTED NOT DETECTED Final   Staphylococcus species NOT DETECTED NOT DETECTED Final   Staphylococcus aureus (BCID) NOT DETECTED NOT DETECTED Final   Streptococcus species NOT DETECTED NOT DETECTED Final   Streptococcus agalactiae NOT DETECTED NOT DETECTED Final   Streptococcus pneumoniae NOT DETECTED NOT DETECTED Final   Streptococcus pyogenes NOT DETECTED NOT DETECTED Final   Acinetobacter baumannii NOT DETECTED NOT DETECTED Final   Enterobacteriaceae species DETECTED (A) NOT DETECTED Final    Comment: Enterobacteriaceae represent a large family of gram-negative bacteria, not a single organism. CRITICAL RESULT CALLED TO, READ BACK BY AND VERIFIED WITH: JASON ROBBINS 03/12/18 1616 KLW    Enterobacter cloacae complex NOT DETECTED NOT DETECTED Final   Escherichia coli DETECTED (A) NOT DETECTED Final    Comment: CRITICAL RESULT CALLED TO, READ BACK BY AND VERIFIED WITH: JASON ROBBINS 03/12/18 1616 KLW    Klebsiella oxytoca NOT DETECTED NOT DETECTED Final   Klebsiella pneumoniae NOT DETECTED NOT DETECTED Final   Proteus species NOT DETECTED NOT DETECTED Final   Serratia marcescens NOT DETECTED NOT DETECTED  Final   Carbapenem resistance NOT DETECTED NOT DETECTED Final   Haemophilus influenzae NOT DETECTED NOT DETECTED Final   Neisseria meningitidis NOT DETECTED NOT DETECTED Final   Pseudomonas aeruginosa NOT DETECTED NOT DETECTED Final   Candida albicans NOT DETECTED NOT DETECTED Final   Candida glabrata NOT DETECTED NOT DETECTED Final   Candida krusei NOT DETECTED NOT DETECTED Final   Candida parapsilosis NOT DETECTED NOT DETECTED Final   Candida tropicalis NOT DETECTED NOT DETECTED Final    Comment: Performed at [facility], [address], [place], [place] [address]   Studies/Results:  Dg C-arm 1-60 Min-no Report  Result Date: 03/13/2018 Fluoroscopy was utilized by the requesting physician.  No radiographic interpretation.   Medications: I have reviewed the patient's current medications. Scheduled Meds: . aspirin EC  81 mg Oral Daily  . feeding supplement (ENSURE ENLIVE)  237 mL Oral BID BM  . ferrous sulfate  325 mg Oral Q breakfast  . gabapentin  600 mg Oral BID  . metoprolol succinate  25 mg Oral Daily  . multivitamin with minerals  1 tablet Oral Daily  . sodium chloride flush  3 mL Intravenous Q12H  . tamsulosin  0.4 mg Oral QPC supper  . vitamin B-12  500 mcg Oral Daily   Continuous Infusions: . sodium chloride Stopped (03/14/18 1710)  . meropenem (MERREM) IV 1 g (03/15/18 0540)   PRN Meds:.sodium chloride, acetaminophen **OR** acetaminophen, bisacodyl, morphine injection, ondansetron **OR** ondansetron (ZOFRAN) IV, senna-docusate   Assessment: Active Problems:   Acute pancreatitis   Jaundice   Obstruction of bile duct   Malnutrition of moderate degree Ascending cholangitis with E. 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 coli  bacteremia, currently treated with meropenem, manage as per primary service Follow-up with Dr. Allen Norris in a month as outpatient to remove plastic stent removal  GI will sign off, please call us back with questions   LOS: 3 days   Drishti Pepperman 03/15/2018, 1:51 PM

## 2018-03-15 NOTE — Progress Notes (Signed)
Patient ID: Jose [name], male   DOB: [birth year], 84 y.o.   MRN: [id number]  [facility] PROGRESS NOTE  Jose [name] HUD:[id number] DOB: 08-16-1923 DOA: 03/11/2018 PCP: [name], MD  HPI/Subjective: Patient feeling well.  He had a temperature of 103 yesterday afternoon.  Feeling better today.  Objective: Vitals:   03/15/18 0617 03/15/18 0823  BP: (!) 102/52 118/70  Pulse: 72 81  Resp:  14  Temp: 98.3 F (36.8 C) 98.4 F (36.9 C)  SpO2: 95% 96%    Filed Weights   03/12/18 0253  Weight: 56.9 kg    ROS: Review of Systems  Constitutional: Negative for chills and fever.  Eyes: Negative for blurred vision.  Respiratory: Negative for cough and shortness of breath.   Cardiovascular: Negative for chest pain.  Gastrointestinal: Negative for abdominal pain, constipation, diarrhea, nausea and vomiting.  Genitourinary: Negative for dysuria.  Musculoskeletal: Negative for joint pain.  Neurological: Negative for dizziness and headaches.   Exam: Physical Exam  HENT:  Nose: No mucosal edema.  Mouth/Throat: No oropharyngeal exudate or posterior oropharyngeal edema.  Eyes: Pupils are equal, round, and reactive to light. Conjunctivae, EOM and lids are normal.  Neck: No JVD present. Carotid bruit is not present. No edema present. No thyroid mass and no thyromegaly present.  Cardiovascular: S1 normal and S2 normal. Exam reveals no gallop.  No murmur heard. Pulses:      Dorsalis pedis pulses are 2+ on the right side, and 2+ on the left side.  Respiratory: No respiratory distress. He has no wheezes. He has no rhonchi. He has no rales.  GI: Soft. Bowel sounds are normal. There is no tenderness.  Musculoskeletal:       Right shoulder: He exhibits no swelling.  Lymphadenopathy:    He has no cervical adenopathy.  Neurological: He is alert. No cranial nerve deficit.  Skin: Skin is warm. No rash noted. Nails show no clubbing.  Psychiatric: He has a normal mood and  affect.      Data Reviewed: Basic Metabolic Panel: Recent Labs  Lab 03/11/18 2112 03/12/18 0732 03/13/18 0401 03/14/18 0411 03/14/18 1715  NA 138  --  137 139 137  K 3.4*  --  3.6 3.8 3.8  CL 104  --  108 105 103  CO2 23  --  21* 24 22  GLUCOSE 159*  --  139* 139* 171*  BUN 19  --  18 12 14   CREATININE 1.39*  --  0.73 0.96 0.88  CALCIUM 8.9  --  8.0* 8.3* 8.5*  MG  --  2.0  --   --   --   PHOS  --  4.0  --   --   --    Liver Function Tests: Recent Labs  Lab 03/11/18 2111 03/13/18 0401 03/14/18 0411 03/15/18 0657  AST 268* 76* 55* 40  ALT 173* 92* 81* 52*  ALKPHOS 796* 465* 509* 377*  BILITOT 4.2* 2.5* 1.8* 1.4*  PROT 6.6 5.0* 6.1* 5.0*  ALBUMIN 3.1* 2.5* 2.8* 2.3*   Recent Labs  Lab 03/11/18 2111 03/13/18 0401  LIPASE 29 28   CBC: Recent Labs  Lab 03/11/18 2112 03/13/18 0401 03/13/18 1316 03/15/18 0657  WBC 10.8* 5.4 5.2 3.7*  NEUTROABS  --   --  4.5  --   HGB 10.7* 8.0* 9.6* 8.2*  HCT 32.0* 23.7* 28.4* 24.8*  MCV 100.3* 99.6 99.0 99.6  PLT 128* 91* 99* 87*   Cardiac Enzymes: Recent Labs  Lab 03/11/18 2112 03/12/18 0732  TROPONINI 0.08* 0.04*    Recent Results (from the past 240 hour(s))  Blood culture (routine x 2)     Status: Abnormal   Collection Time: 03/11/18  9:11 PM  Result Value Ref Range Status   Specimen Description   Final    BLOOD BLOOD RIGHT FOREARM Performed at [facility], [address] [place], [place] [address]    Special Requests   Final    BOTTLES DRAWN AEROBIC AND ANAEROBIC Blood Culture adequate volume Performed at [facility], [address] [place], [place] [address]    Culture  Setup Time   Final    GRAM NEGATIVE RODS AEROBIC BOTTLE ONLY CRITICAL RESULT CALLED TO, READ BACK BY AND VERIFIED WITH: JASON ROBBINS 03/12/18 1616 KLW Performed at [facility], [place] [address], [place], [place] [address]    Culture (A)  Final    ESCHERICHIA COLI Confirmed Extended Spectrum  Beta-Lactamase Producer (ESBL).  In bloodstream infections from ESBL organisms, carbapenems are preferred over piperacillin/tazobactam. They are shown to have a lower risk of mortality.    Report Status 03/14/2018 FINAL  Final   Organism ID, Bacteria ESCHERICHIA COLI  Final      Susceptibility   Escherichia coli - MIC*    AMPICILLIN >=32 RESISTANT Resistant     CEFAZOLIN >=64 RESISTANT Resistant     CEFEPIME RESISTANT Resistant     CEFTAZIDIME RESISTANT Resistant     CEFTRIAXONE RESISTANT Resistant     CIPROFLOXACIN <=0.25 SENSITIVE Sensitive     GENTAMICIN <=1 SENSITIVE Sensitive     IMIPENEM <=0.25 SENSITIVE Sensitive     TRIMETH/SULFA <=20 SENSITIVE Sensitive     AMPICILLIN/SULBACTAM 4 SENSITIVE Sensitive     PIP/TAZO <=4 SENSITIVE Sensitive     Extended ESBL POSITIVE Resistant     * ESCHERICHIA COLI  Blood culture (routine x 2)     Status: Abnormal   Collection Time: 03/11/18  9:11 PM  Result Value Ref Range Status   Specimen Description   Final    BLOOD BLOOD LEFT FOREARM Performed at [facility], [address], [place], [place] [address]    Special Requests   Final    BOTTLES DRAWN AEROBIC AND ANAEROBIC Blood Culture adequate volume Performed at [facility], [address] [place], [place] [address]    Culture  Setup Time   Final    AEROBIC BOTTLE ONLY GRAM NEGATIVE RODS CRITICAL RESULT CALLED TO, READ BACK BY AND VERIFIED WITH: JASON ROBBINS 03/12/18 1616 KLW Performed at [facility], [place], [place] [address]    Culture (A)  Final    ESCHERICHIA COLI SUSCEPTIBILITIES PERFORMED ON PREVIOUS CULTURE WITHIN THE LAST 5 DAYS. Performed at [facility], [place] [address] [place], [place] [address]    Report Status 03/14/2018 FINAL  Final  Blood Culture ID Panel (Reflexed)     Status: Abnormal   Collection Time: 03/11/18  9:11 PM  Result Value Ref Range Status   Enterococcus species NOT DETECTED NOT DETECTED Final    Listeria monocytogenes NOT DETECTED NOT DETECTED Final   Staphylococcus species NOT DETECTED NOT DETECTED Final   Staphylococcus aureus (BCID) NOT DETECTED NOT DETECTED Final   Streptococcus species NOT DETECTED NOT DETECTED Final   Streptococcus agalactiae NOT DETECTED NOT DETECTED Final   Streptococcus pneumoniae NOT DETECTED NOT DETECTED Final   Streptococcus pyogenes NOT DETECTED NOT DETECTED Final   Acinetobacter baumannii NOT DETECTED  NOT DETECTED Final   Enterobacteriaceae species DETECTED (A) NOT DETECTED Final    Comment: Enterobacteriaceae represent a large family of gram-negative bacteria, not a single organism. CRITICAL RESULT CALLED TO, READ BACK BY AND VERIFIED WITH: JASON ROBBINS 03/12/18 1616 KLW    Enterobacter cloacae complex NOT DETECTED NOT DETECTED Final   Escherichia coli DETECTED (A) NOT DETECTED Final    Comment: CRITICAL RESULT CALLED TO, READ BACK BY AND VERIFIED WITH: JASON ROBBINS 03/12/18 1616 KLW    Klebsiella oxytoca NOT DETECTED NOT DETECTED Final   Klebsiella pneumoniae NOT DETECTED NOT DETECTED Final   Proteus species NOT DETECTED NOT DETECTED Final   Serratia marcescens NOT DETECTED NOT DETECTED Final   Carbapenem resistance NOT DETECTED NOT DETECTED Final   Haemophilus influenzae NOT DETECTED NOT DETECTED Final   Neisseria meningitidis NOT DETECTED NOT DETECTED Final   Pseudomonas aeruginosa NOT DETECTED NOT DETECTED Final   Candida albicans NOT DETECTED NOT DETECTED Final   Candida glabrata NOT DETECTED NOT DETECTED Final   Candida krusei NOT DETECTED NOT DETECTED Final   Candida parapsilosis NOT DETECTED NOT DETECTED Final   Candida tropicalis NOT DETECTED NOT DETECTED Final    Comment: Performed at [facility], [address], [place], [place] [address]     Studies: Dg C-arm 1-60 Min-no Report  Result Date: 03/13/2018 Fluoroscopy was utilized by the requesting physician.  No radiographic interpretation.    Scheduled Meds: .  aspirin EC  81 mg Oral Daily  . feeding supplement (ENSURE ENLIVE)  237 mL Oral BID BM  . ferrous sulfate  325 mg Oral Q breakfast  . gabapentin  600 mg Oral BID  . metoprolol succinate  25 mg Oral Daily  . multivitamin with minerals  1 tablet Oral Daily  . sodium chloride flush  3 mL Intravenous Q12H  . tamsulosin  0.4 mg Oral QPC supper  . vitamin B-12  500 mcg Oral Daily   Continuous Infusions: . sodium chloride Stopped (03/14/18 1710)  . meropenem (MERREM) IV 1 g (03/15/18 0540)    Assessment/Plan:  1. Sepsis with E. coli.  On meropenem.  Monitor temperature curve.  Would like to see temperature curve come back down to normal prior to disposition.  We will switch to p.o. Levaquin upon discharge. 2. Acute cholangitis.  Patient had recent cholecystectomy.  Had ERCP with stent and sphincterotomy.  Had high fever yesterday.  Watch another day in the hospital. 3. Acute kidney injury improved 4. Elevated liver function test secondary to acute cholangitis 5. Moderate malnutrition on Ensure 6. BPH on Flomax 7. Tachycardia.  Started on Toprol-XL.  Code Status:     Code Status Orders  (From admission, onward)         Start     Ordered   03/12/18 0245  Full code  Continuous     03/12/18 0244        Code Status History    Date Active Date Inactive Code Status Order ID Comments User Context   01/26/2018 1208 01/27/2018 1759 Full Code [id number]  Coralie Keens, MD Inpatient   01/04/2018 0821 01/05/2018 1940 Full Code [id number]  Herbert Pun, MD ED   12/27/2017 1338 12/30/2017 1723 Full Code [id number]  Benjamine Sprague, DO Inpatient    Advance Directive Documentation     Most Recent Value  Type of Advance Directive  Healthcare Power of Attorney, Living will  Pre-existing out of facility DNR order (yellow form or pink MOST form)  -  "MOST" Form  in Place?  -     Family Communication: Spoke with son on the phone Disposition Plan: If afebrile today and tomorrow may be able to  go home tomorrow.  Consultants:  Gastroenterology  Antibiotics:  Meropenem  Time spent: 28 minutes  [place]

## 2018-03-15 NOTE — Plan of Care (Signed)
  Problem: Clinical Measurements: Goal: Ability to maintain clinical measurements within normal limits will improve Outcome: Progressing Patient maintained oral temp below 101.0 F   Problem: Activity: Goal: Risk for activity intolerance will decrease Outcome: Progressing  Pt ambulated around nurses station 2 times today, HR maintained at 110-114bpm

## 2018-03-16 LAB — PLATELET COUNT: Platelets: 85 10*3/uL — ABNORMAL LOW (ref 150–400)

## 2018-03-16 LAB — HEMOGLOBIN: Hemoglobin: 8.4 g/dL — ABNORMAL LOW (ref 13.0–17.0)

## 2018-03-16 NOTE — Progress Notes (Addendum)
Pt son voice out his concerned about pt possibly getting discharge tomorrow. Sons states that with pt being on isolation his concern is that pt cant go home not until his clear of infection. Nurse replied to the Son " I will make sure that I will notify the nurse in the morning to let the attending know of sons concern". Will continue to monitor.  Update 2100: Tye [place] ( Son's wife ) called and was concern about the infection. Pt family member was informed about what type of infection was the pt on and the abx that the pt was on. As per Day shift nurse the doctor called the " SON" today at around lunch time and give an update about the pt. Printed information about ESBL and left on pt room. Department director and [facility] made aware. Will notify the incoming nurse in the morning about the printed information and hand it to the family. Will continue to monitor.  Update 0530: Pt IV started to leaked after abx administration and pt states hurting and burning and request to take IV out. IV abx was stopped. Attempted to start IV on pt twice but was unsuccessful. IV team was consulted. Will continue to monitor.

## 2018-03-16 NOTE — Progress Notes (Signed)
Physical Therapy Treatment Patient Details Name: Jose [name] MRN: [id number] DOB: May 03, 1924 Today's Date: 03/16/2018    History of Present Illness Patient is a 84 year old male admitted with acute pancreatitis and obstruction of obstruction of bile duct.  Endoscopy performed on 03/13/2018.  PMh includes Htn, BPH, hypokalemia, anxiety, back pain and pt reported chronic knee pain.    PT Comments    Bed mobility without difficulty.  Stood with min guard/supervision and was able to ambulate x 3 around unit with walker and min guard.  Gait generally unsteady but no formal LOB.  Pt c/o knee pain but motivated to increase mobility skills and return home.  Encouraged use of walker at home (pt already has one at home) and +1 assist upon discharge for general safety.   Follow Up Recommendations  Home health PT;Supervision for mobility/OOB     Equipment Recommendations  None recommended by PT    Recommendations for Other Services       Precautions / Restrictions Precautions Precautions: Fall Restrictions Weight Bearing Restrictions: No    Mobility  Bed Mobility Overal bed mobility: Independent                Transfers Overall transfer level: Needs assistance Equipment used: Rolling walker (2 wheeled) Transfers: Sit to/from Stand Sit to Stand: Supervision         General transfer comment: Able to stand from bedside without physical assist.  Requires assistance for use of RW.  Pt does appear unsteady on his feet.  Ambulation/Gait Ambulation/Gait assistance: Min guard Gait Distance (Feet): 500 Feet Assistive device: Rolling walker (2 wheeled) Gait Pattern/deviations: Step-through pattern;Narrow base of support;Trunk flexed;Decreased step length - right;Decreased step length - left         Stairs             Wheelchair Mobility    Modified Rankin (Stroke Patients Only)       Balance Overall balance assessment: Needs assistance Sitting-balance  support: Feet supported Sitting balance-Leahy Scale: Good     Standing balance support: Bilateral upper extremity supported Standing balance-Leahy Scale: Fair                              Cognition Arousal/Alertness: Awake/alert Behavior During Therapy: WFL for tasks assessed/performed Overall Cognitive Status: Within Functional Limits for tasks assessed                                        Exercises      General Comments        Pertinent Vitals/Pain Pain Assessment: No/denies pain    Home Living                      Prior Function            PT Goals (current goals can now be found in the care plan section) Progress towards PT goals: Progressing toward goals    Frequency    Min 2X/week      PT Plan Current plan remains appropriate    Co-evaluation              AM-PAC PT "6 Clicks" Daily Activity  Outcome Measure  Difficulty turning over in bed (including adjusting bedclothes, sheets and blankets)?: None Difficulty moving from lying on back to sitting on the side of the  bed? : None Difficulty sitting down on and standing up from a chair with arms (e.g., wheelchair, bedside commode, etc,.)?: A Little Help needed moving to and from a bed to chair (including a wheelchair)?: A Little Help needed walking in hospital room?: A Little Help needed climbing 3-5 steps with a railing? : A Little 6 Click Score: 20    End of Session Equipment Utilized During Treatment: Gait belt Activity Tolerance: Patient tolerated treatment well Patient left: in bed;with call bell/phone within reach;with bed alarm set;with family/visitor present         Time: 2423-5361 PT Time Calculation (min) (ACUTE ONLY): 12 min  Charges:  $Gait Training: 8-22 mins                     Chesley Noon, PTA 03/16/18, 1:15 PM

## 2018-03-16 NOTE — Plan of Care (Signed)
  Problem: Activity: Goal: Risk for activity intolerance will decrease Outcome: Progressing   Problem: Pain Managment: Goal: General experience of comfort will improve Outcome: Progressing   Problem: Safety: Goal: Ability to remain free from injury will improve Outcome: Progressing   

## 2018-03-16 NOTE — Progress Notes (Signed)
Patient ID: Jose [name], male   DOB: 30-Sep-1923, 84 y.o.   MRN: [id number]  [facility] PROGRESS NOTE  Jose [name] [place] MGQ:[id number] DOB: [birth year] DOA: 03/11/2018 PCP: [name], MD  HPI/Subjective: Patient states he feels well.  Had another low-grade temperature last night.  Does well during the day but then has fever at night.  Patient asking every day when he can go home.  Objective: Vitals:   03/16/18 1001 03/16/18 1617  BP:  (!) 142/78  Pulse:  84  Resp:  16  Temp: 97.6 F (36.4 C) 98 F (36.7 C)  SpO2:  97%    Filed Weights   03/12/18 0253  Weight: 56.9 kg    ROS: Review of Systems  Constitutional: Positive for fever. Negative for chills.  Eyes: Negative for blurred vision.  Respiratory: Negative for cough and shortness of breath.   Cardiovascular: Negative for chest pain.  Gastrointestinal: Negative for abdominal pain, constipation, diarrhea, nausea and vomiting.  Genitourinary: Negative for dysuria.  Musculoskeletal: Negative for joint pain.  Neurological: Negative for dizziness and headaches.   Exam: Physical Exam  HENT:  Nose: No mucosal edema.  Mouth/Throat: No oropharyngeal exudate or posterior oropharyngeal edema.  Eyes: Pupils are equal, round, and reactive to light. Conjunctivae, EOM and lids are normal.  Neck: No JVD present. Carotid bruit is not present. No edema present. No thyroid mass and no thyromegaly present.  Cardiovascular: S1 normal and S2 normal. Exam reveals no gallop.  No murmur heard. Pulses:      Dorsalis pedis pulses are 2+ on the right side, and 2+ on the left side.  Respiratory: No respiratory distress. He has no wheezes. He has no rhonchi. He has no rales.  GI: Soft. Bowel sounds are normal. There is no tenderness.  Musculoskeletal:       Right shoulder: He exhibits no swelling.  Lymphadenopathy:    He has no cervical adenopathy.  Neurological: He is alert. No cranial nerve deficit.  Skin: Skin is  warm. No rash noted. Nails show no clubbing.  Psychiatric: He has a normal mood and affect.      Data Reviewed: Basic Metabolic Panel: Recent Labs  Lab 03/11/18 2112 03/12/18 0732 03/13/18 0401 03/14/18 0411 03/14/18 1715  NA 138  --  137 139 137  K 3.4*  --  3.6 3.8 3.8  CL 104  --  108 105 103  CO2 23  --  21* 24 22  GLUCOSE 159*  --  139* 139* 171*  BUN 19  --  18 12 14   CREATININE 1.39*  --  0.73 0.96 0.88  CALCIUM 8.9  --  8.0* 8.3* 8.5*  MG  --  2.0  --   --   --   PHOS  --  4.0  --   --   --    Liver Function Tests: Recent Labs  Lab 03/11/18 2111 03/13/18 0401 03/14/18 0411 03/15/18 0657 03/15/18 1645  AST 268* 76* 55* 40 71*  ALT 173* 92* 81* 52* 59*  ALKPHOS 796* 465* 509* 377* 399*  BILITOT 4.2* 2.5* 1.8* 1.4* 1.2  PROT 6.6 5.0* 6.1* 5.0* 5.3*  ALBUMIN 3.1* 2.5* 2.8* 2.3* 2.6*   Recent Labs  Lab 03/11/18 2111 03/13/18 0401  LIPASE 29 28   CBC: Recent Labs  Lab 03/11/18 2112 03/13/18 0401 03/13/18 1316 03/15/18 0657 03/16/18 0511  WBC 10.8* 5.4 5.2 3.7*  --   NEUTROABS  --   --  4.5  --   --  HGB 10.7* 8.0* 9.6* 8.2* 8.4*  HCT 32.0* 23.7* 28.4* 24.8*  --   MCV 100.3* 99.6 99.0 99.6  --   PLT 128* 91* 99* 87* 85*   Cardiac Enzymes: Recent Labs  Lab 03/11/18 2112 03/12/18 0732  TROPONINI 0.08* 0.04*    Recent Results (from the past 240 hour(s))  Blood culture (routine x 2)     Status: Abnormal   Collection Time: 03/11/18  9:11 PM  Result Value Ref Range Status   Specimen Description   Final    BLOOD BLOOD RIGHT FOREARM Performed at [facility], [address] [place], [place] [address]    Special Requests   Final    BOTTLES DRAWN AEROBIC AND ANAEROBIC Blood Culture adequate volume Performed at [facility], [address] [place], [place] [address]    Culture  Setup Time   Final    GRAM NEGATIVE RODS AEROBIC BOTTLE ONLY CRITICAL RESULT CALLED TO, READ BACK BY AND VERIFIED WITH: JASON ROBBINS 03/12/18  1616 KLW Performed at [facility], [place] [address], [place], [place] [address]    Culture (A)  Final    ESCHERICHIA COLI Confirmed Extended Spectrum Beta-Lactamase Producer (ESBL).  In bloodstream infections from ESBL organisms, carbapenems are preferred over piperacillin/tazobactam. They are shown to have a lower risk of mortality.    Report Status 03/14/2018 FINAL  Final   Organism ID, Bacteria ESCHERICHIA COLI  Final      Susceptibility   Escherichia coli - MIC*    AMPICILLIN >=32 RESISTANT Resistant     CEFAZOLIN >=64 RESISTANT Resistant     CEFEPIME RESISTANT Resistant     CEFTAZIDIME RESISTANT Resistant     CEFTRIAXONE RESISTANT Resistant     CIPROFLOXACIN <=0.25 SENSITIVE Sensitive     GENTAMICIN <=1 SENSITIVE Sensitive     IMIPENEM <=0.25 SENSITIVE Sensitive     TRIMETH/SULFA <=20 SENSITIVE Sensitive     AMPICILLIN/SULBACTAM 4 SENSITIVE Sensitive     PIP/TAZO <=4 SENSITIVE Sensitive     Extended ESBL POSITIVE Resistant     * ESCHERICHIA COLI  Blood culture (routine x 2)     Status: Abnormal   Collection Time: 03/11/18  9:11 PM  Result Value Ref Range Status   Specimen Description   Final    BLOOD BLOOD LEFT FOREARM Performed at [facility], [address], [place], [place] [address]    Special Requests   Final    BOTTLES DRAWN AEROBIC AND ANAEROBIC Blood Culture adequate volume Performed at [facility], [address] [place], [place] [address]    Culture  Setup Time   Final    AEROBIC BOTTLE ONLY GRAM NEGATIVE RODS CRITICAL RESULT CALLED TO, READ BACK BY AND VERIFIED WITH: JASON ROBBINS 03/12/18 1616 KLW Performed at [facility], [place], [place] [address]    Culture (A)  Final    ESCHERICHIA COLI SUSCEPTIBILITIES PERFORMED ON PREVIOUS CULTURE WITHIN THE LAST 5 DAYS. Performed at [facility], [place] [address] [place], [place] [address]    Report Status 03/14/2018 FINAL  Final  Blood Culture ID Panel  (Reflexed)     Status: Abnormal   Collection Time: 03/11/18  9:11 PM  Result Value Ref Range Status   Enterococcus species NOT DETECTED NOT DETECTED Final   Listeria monocytogenes NOT DETECTED NOT DETECTED Final   Staphylococcus species NOT DETECTED NOT DETECTED Final   Staphylococcus aureus (BCID) NOT DETECTED NOT DETECTED Final   Streptococcus species  NOT DETECTED NOT DETECTED Final   Streptococcus agalactiae NOT DETECTED NOT DETECTED Final   Streptococcus pneumoniae NOT DETECTED NOT DETECTED Final   Streptococcus pyogenes NOT DETECTED NOT DETECTED Final   Acinetobacter baumannii NOT DETECTED NOT DETECTED Final   Enterobacteriaceae species DETECTED (A) NOT DETECTED Final    Comment: Enterobacteriaceae represent a large family of gram-negative bacteria, not a single organism. CRITICAL RESULT CALLED TO, READ BACK BY AND VERIFIED WITH: JASON ROBBINS 03/12/18 1616 KLW    Enterobacter cloacae complex NOT DETECTED NOT DETECTED Final   Escherichia coli DETECTED (A) NOT DETECTED Final    Comment: CRITICAL RESULT CALLED TO, READ BACK BY AND VERIFIED WITH: JASON ROBBINS 03/12/18 1616 KLW    Klebsiella oxytoca NOT DETECTED NOT DETECTED Final   Klebsiella pneumoniae NOT DETECTED NOT DETECTED Final   Proteus species NOT DETECTED NOT DETECTED Final   Serratia marcescens NOT DETECTED NOT DETECTED Final   Carbapenem resistance NOT DETECTED NOT DETECTED Final   Haemophilus influenzae NOT DETECTED NOT DETECTED Final   Neisseria meningitidis NOT DETECTED NOT DETECTED Final   Pseudomonas aeruginosa NOT DETECTED NOT DETECTED Final   Candida albicans NOT DETECTED NOT DETECTED Final   Candida glabrata NOT DETECTED NOT DETECTED Final   Candida krusei NOT DETECTED NOT DETECTED Final   Candida parapsilosis NOT DETECTED NOT DETECTED Final   Candida tropicalis NOT DETECTED NOT DETECTED Final    Comment: Performed at [facility], [address] [place], [place] [address]     Scheduled Meds: .  aspirin EC  81 mg Oral Daily  . feeding supplement (ENSURE ENLIVE)  237 mL Oral BID BM  . ferrous sulfate  325 mg Oral Q breakfast  . gabapentin  600 mg Oral BID  . metoprolol succinate  25 mg Oral Daily  . multivitamin with minerals  1 tablet Oral Daily  . sodium chloride flush  3 mL Intravenous Q12H  . tamsulosin  0.4 mg Oral QPC supper  . vitamin B-12  500 mcg Oral Daily   Continuous Infusions: . sodium chloride Stopped (03/14/18 1710)  . meropenem (MERREM) IV 1 g (03/16/18 0553)    Assessment/Plan:  1. Sepsis with E. coli.  On meropenem.  Monitor temperature curve.  Monitor temperature curve this evening.  If no fever this evening, I will discharge tomorrow.  I will switch to p.o. Levaquin upon discharge. 2. Acute cholangitis.  Patient had recent cholecystectomy.  Had ERCP with stent and sphincterotomy.  Had low-grade fever last night.  Watch another day in the hospital. 3. Thrombocytopenia secondary to sepsis. 4. Acute kidney injury improved 5. Elevated liver function test secondary to acute cholangitis 6. Moderate malnutrition on Ensure 7. BPH on Flomax 8. Tachycardia.  Started on Toprol-XL. 9. Anemia of chronic disease  Code Status:     Code Status Orders  (From admission, onward)         Start     Ordered   03/12/18 0245  Full code  Continuous     03/12/18 0244        Code Status History    Date Active Date Inactive Code Status Order ID Comments User Context   01/26/2018 1208 01/27/2018 1759 Full Code [id number]  Coralie Keens, MD Inpatient   01/04/2018 0821 01/05/2018 1940 Full Code [id number]  Herbert Pun, MD ED   12/27/2017 1338 12/30/2017 1723 Full Code [id number]  Benjamine Sprague, DO Inpatient    Advance Directive Documentation     Most Recent Value  Type of Advance  Directive  Healthcare Power of Attorney, Living will  Pre-existing out of facility DNR order (yellow form or pink MOST form)  -  "MOST" Form in Place?  -     Family Communication: Spoke  with son on the phone Disposition Plan: Monitor temperature curve this evening.  If afebrile this evening may be able to go home tomorrow  Consultants:  Gastroenterology  Antibiotics:  Meropenem  Time spent: 27 minutes  [place]

## 2018-03-16 NOTE — Care Management Important Message (Signed)
Important Message  Patient Details  Name: Jose [name] MRN: [id number] Date of Birth: [birth year]   Medicare Important Message Given:  Yes    Elza Rafter, RN 03/16/2018, 10:53 AM

## 2018-03-17 ENCOUNTER — Encounter: Payer: Self-pay | Admitting: Gastroenterology

## 2018-03-17 LAB — CBC
HCT: 23.8 % — ABNORMAL LOW (ref 39.0–52.0)
Hemoglobin: 7.8 g/dL — ABNORMAL LOW (ref 13.0–17.0)
MCH: 32.9 pg (ref 26.0–34.0)
MCHC: 32.8 g/dL (ref 30.0–36.0)
MCV: 100.4 fL — ABNORMAL HIGH (ref 80.0–100.0)
PLATELETS: 88 10*3/uL — AB (ref 150–400)
RBC: 2.37 MIL/uL — ABNORMAL LOW (ref 4.22–5.81)
RDW: 14.8 % (ref 11.5–15.5)
WBC: 4.2 10*3/uL (ref 4.0–10.5)
nRBC: 0 % (ref 0.0–0.2)

## 2018-03-17 LAB — COMPREHENSIVE METABOLIC PANEL
ALT: 49 U/L — AB (ref 0–44)
AST: 44 U/L — ABNORMAL HIGH (ref 15–41)
Albumin: 2.3 g/dL — ABNORMAL LOW (ref 3.5–5.0)
Alkaline Phosphatase: 314 U/L — ABNORMAL HIGH (ref 38–126)
Anion gap: 5 (ref 5–15)
BILIRUBIN TOTAL: 0.9 mg/dL (ref 0.3–1.2)
BUN: 13 mg/dL (ref 8–23)
CALCIUM: 7.7 mg/dL — AB (ref 8.9–10.3)
CO2: 29 mmol/L (ref 22–32)
CREATININE: 0.7 mg/dL (ref 0.61–1.24)
Chloride: 105 mmol/L (ref 98–111)
GFR calc Af Amer: >60 mL/min (ref >60)
Glucose, Bld: 115 mg/dL — ABNORMAL HIGH (ref 70–99)
Potassium: 3 mmol/L — ABNORMAL LOW (ref 3.5–5.1)
Sodium: 139 mmol/L (ref 135–145)
TOTAL PROTEIN: 5 g/dL — AB (ref 6.5–8.1)

## 2018-03-17 MED ORDER — ADULT MULTIVITAMIN W/MINERALS CH: 1.0000 | ORAL_TABLET | Freq: Every day | ORAL | 0 refills | Status: AC

## 2018-03-17 MED ORDER — ENSURE ENLIVE PO LIQD: 6.0000 mL | Freq: Two times a day (BID) | ORAL | 0 refills | Status: AC

## 2018-03-17 MED ORDER — LEVOFLOXACIN 500 MG PO TABS: 500.0000 mg | ORAL_TABLET | Freq: Every day | ORAL | 0 refills | Status: DC

## 2018-03-17 MED ORDER — POTASSIUM CHLORIDE CRYS ER 20 MEQ PO TBCR
40.0000 meq | EXTENDED_RELEASE_TABLET | Freq: Once | ORAL | Status: AC
  Administered 2018-03-17: 40 meq via ORAL
  Filled 2018-03-17: qty 2

## 2018-03-17 MED ORDER — LEVOFLOXACIN 500 MG PO TABS
500.0000 mg | ORAL_TABLET | Freq: Every day | ORAL | Status: DC
  Administered 2018-03-17 – 2018-03-18 (×2): 500 mg via ORAL
  Filled 2018-03-17 (×2): qty 1

## 2018-03-17 MED ORDER — METOPROLOL SUCCINATE ER 25 MG PO TB24: 25.0000 mg | ORAL_TABLET | Freq: Every day | ORAL | 0 refills | Status: DC

## 2018-03-17 NOTE — Progress Notes (Signed)
Patient ID: Jose [name], male   DOB: Dec 19, 1923, 84 y.o.   MRN: [id number]  Patient did not have a fever last night.  I explained to the son yesterday if he was afebrile today I will be discharging home today.  Patient is stable for discharge with further treatment with oral antibiotics as outpatient.  Spoke with the son on the phone and he states that he is going to protest to discharge.  Patient is not a candidate to be transferred to [facility] because the patient is stable for discharge at this point.  Dr. Loletha Grayer

## 2018-03-17 NOTE — Care Management (Signed)
Discharging today with home health RN, PT, Aide, [facility] with [facility] is aware of patient discharge.

## 2018-03-17 NOTE — Plan of Care (Signed)

## 2018-03-17 NOTE — Care Management (Signed)
Appeal process started this morning.  Son, Inocente Salles has appealed his father's discharge.  A detailed notice of discharge was given to the son today and signed.  Paperwork given to [employer].  [facility] is set up for home health RN, PT, SW, Aide when patient discharges.

## 2018-03-17 NOTE — Progress Notes (Signed)
Physical Therapy Treatment Patient Details Name: Jose [name] MRN: [id number] DOB: 1923/06/04 Today's Date: 03/17/2018    History of Present Illness Patient is a 84 year old male admitted with acute pancreatitis and obstruction of obstruction of bile duct.  Endoscopy performed on 03/13/2018.  PMh includes Htn, BPH, hypokalemia, anxiety, back pain and pt reported chronic knee pain.    PT Comments    Bed mobility without difficulty.  Ambulated x 2 around unit with walker and min guard.  C/o bilateral knee pain.  Stated he wears braces at home.  Encouraged him to resume wear at home as he says they help him and use walker upon discharge.  HR stable in 90's with gait.   Follow Up Recommendations  Home health PT;Supervision for mobility/OOB     Equipment Recommendations  None recommended by PT    Recommendations for Other Services       Precautions / Restrictions Precautions Precautions: Fall    Mobility  Bed Mobility Overal bed mobility: Independent                Transfers Overall transfer level: Modified independent Equipment used: Rolling walker (2 wheeled) Transfers: Sit to/from Stand Sit to Stand: Modified independent (Device/Increase time)            Ambulation/Gait Ambulation/Gait assistance: Supervision;Min guard Gait Distance (Feet): 360 Feet Assistive device: Rolling walker (2 wheeled) Gait Pattern/deviations: Step-through pattern Gait velocity: decreased   General Gait Details: limited by B knee pain   Stairs             Wheelchair Mobility    Modified Rankin (Stroke Patients Only)       Balance Overall balance assessment: Needs assistance Sitting-balance support: Feet supported Sitting balance-Leahy Scale: Good     Standing balance support: Bilateral upper extremity supported Standing balance-Leahy Scale: Fair                              Cognition Arousal/Alertness: Awake/alert Behavior During Therapy:  WFL for tasks assessed/performed Overall Cognitive Status: Within Functional Limits for tasks assessed                                        Exercises      General Comments        Pertinent Vitals/Pain Pain Assessment: Faces Faces Pain Scale: Hurts little more Pain Location: B knees - wears compression knee braces at home Pain Descriptors / Indicators: Sore Pain Intervention(s): Monitored during session;Limited activity within patient's tolerance    Home Living                      Prior Function            PT Goals (current goals can now be found in the care plan section) Progress towards PT goals: Progressing toward goals    Frequency    Min 2X/week      PT Plan Current plan remains appropriate    Co-evaluation              AM-PAC PT "6 Clicks" Daily Activity  Outcome Measure  Difficulty turning over in bed (including adjusting bedclothes, sheets and blankets)?: None Difficulty moving from lying on back to sitting on the side of the bed? : None Difficulty sitting down on and standing up from a chair with  arms (e.g., wheelchair, bedside commode, etc,.)?: None Help needed moving to and from a bed to chair (including a wheelchair)?: None Help needed walking in hospital room?: A Little Help needed climbing 3-5 steps with a railing? : A Little 6 Click Score: 22    End of Session Equipment Utilized During Treatment: Gait belt Activity Tolerance: Patient tolerated treatment well Patient left: in bed;with call bell/phone within reach;with bed alarm set         Time: 1208-1220 PT Time Calculation (min) (ACUTE ONLY): 12 min  Charges:  $Gait Training: 8-22 mins                     Chesley Noon, PTA 03/17/18, 12:25 PM

## 2018-03-17 NOTE — Discharge Instructions (Signed)
C-

## 2018-03-17 NOTE — Care Management (Signed)
Notified [facility] of discharge appeal.

## 2018-03-17 NOTE — Discharge Summary (Signed)
[name] NAME: Jose [name]    MR#:  [id number]  DATE OF BIRTH:  1923-06-25  DATE OF ADMISSION:  03/11/2018 ADMITTING PHYSICIAN: Arta Silence, MD  DATE OF DISCHARGE: 03/17/2018  PRIMARY CARE PHYSICIAN: [name], MD    ADMISSION DIAGNOSIS:  Pancreatitis [K85.90] Acute pancreatitis, unspecified complication status, unspecified pancreatitis type [K85.90] Sepsis, due to unspecified organism, unspecified whether acute organ dysfunction present ([place]) [A41.9]  DISCHARGE DIAGNOSIS:  Active Problems:   Acute pancreatitis   Jaundice   Obstruction of bile duct   Malnutrition of moderate degree   SECONDARY DIAGNOSIS:   Past Medical History:  Diagnosis Date  . Anxiety   . Back problem 1960  . BPH (benign prostatic hypertrophy)   . Cervical radiculopathy    with neuropathy  . Dyspnea   . GERD (gastroesophageal reflux disease)   . Hypertension   . Hypokalemia   . Melanoma in situ ([place]) 12/2016   neck    HOSPITAL COURSE:   1.  Sepsis with E. Coli with some resistance pattern.  The patient has been on meropenem up until this morning where I switched him over to oral Levaquin.  The patient's temperature curve has come down and patient was afebrile overnight and this morning.  I spoke with the son last night about potential discharge today if he had no fever last night.  When I spoke with him today he said he is not comfortable with the discharge and wanted me to transfer the patient to [facility].  I told him that he is not a candidate to be transferred at this point since he is ready for discharge.  He is protesting the decision for discharge today. 2.  Acute cholangitis.  Patient recently had a cholecystectomy.  He had an ERCP with stent and sphincterotomy.  The patient's liver function test has trended down and bilirubin is now normal. 3.  Thrombocytopenia secondary to sepsis.  This should recover over time. 4.   Acute kidney injury.  Improved with IV fluids. 5.  Elevated liver function test secondary to cholangitis.  Bilirubin has normalized.  The other liver function tests are trending better. 6.  Moderate malnutrition on Ensure. 7.  BPH on Flomax 8.  Tachycardia during the hospital course was started on Toprol XL. 9.  Anemia of chronic disease.  Hemoglobin trended lower with blood draws and IV fluids during the hospital course.  Follow-up as outpatient.  DISCHARGE CONDITIONS:   Satisfactory  CONSULTS OBTAINED:  Treatment Team:  Arta Silence, MD  DRUG ALLERGIES:  No Known Allergies  DISCHARGE MEDICATIONS:   Allergies as of 03/17/2018   No Known Allergies     Medication List    STOP taking these medications   aspirin 81 MG tablet     TAKE these medications   acetaminophen 500 MG tablet Commonly known as:  TYLENOL Take 1,000 mg by mouth every 6 (six) hours as needed.   feeding supplement (ENSURE ENLIVE) Liqd Take 6 mLs by mouth 2 (two) times daily between meals.   ferrous sulfate 325 (65 FE) MG EC tablet Take 325 mg by mouth daily with breakfast.   gabapentin 600 MG tablet Commonly known as:  NEURONTIN TAKE 1 TABLET (600 MG TOTAL) BY MOUTH 2 (TWO) TIMES DAILY. What changed:  See the new instructions.   ketoconazole 2 % cream Commonly known as:  NIZORAL Apply 1 application topically 2 (two) times daily as needed for irritation.  levofloxacin 500 MG tablet Commonly known as:  LEVAQUIN Take 1 tablet (500 mg total) by mouth daily.   metoprolol succinate 25 MG 24 hr tablet Commonly known as:  TOPROL-XL Take 1 tablet (25 mg total) by mouth daily.   multivitamin with minerals Tabs tablet Take 1 tablet by mouth daily.   potassium chloride SA 20 MEQ tablet Commonly known as:  K-DUR,KLOR-CON Take 2 tablets (40 mEq total) by mouth daily. What changed:  when to take this   tamsulosin 0.4 MG Caps capsule Commonly known as:  FLOMAX TAKE 1 CAPSULE (0.4 MG TOTAL) BY  MOUTH DAILY. What changed:  See the new instructions.   triamcinolone cream 0.1 % Commonly known as:  KENALOG Apply 1 application topically 2 (two) times daily as needed. What changed:  reasons to take this   vitamin B-12 500 MCG tablet Commonly known as:  CYANOCOBALAMIN Take 500 mcg by mouth daily.        DISCHARGE INSTRUCTIONS:   Follow-up PMD 5 days Follow-up Dr. Allen Norris gastroenterology 3 weeks  If you experience worsening of your admission symptoms, develop shortness of breath, life threatening emergency, suicidal or homicidal thoughts you must seek medical attention immediately by calling 911 or calling your MD immediately  if symptoms less severe.  You Must read complete instructions/literature along with all the possible adverse reactions/side effects for all the Medicines you take and that have been prescribed to you. Take any new Medicines after you have completely understood and accept all the possible adverse reactions/side effects.   Please note  You were cared for by a hospitalist during your hospital stay. If you have any questions about your discharge medications or the care you received while you were in the hospital after you are discharged, you can call the unit and asked to speak with the hospitalist on call if the hospitalist that took care of you is not available. Once you are discharged, your primary care physician will handle any further medical issues. Please note that NO REFILLS for any discharge medications will be authorized once you are discharged, as it is imperative that you return to your primary care physician (or establish a relationship with a primary care physician if you do not have one) for your aftercare needs so that they can reassess your need for medications and monitor your lab values.    Today   CHIEF COMPLAINT:   Chief Complaint  Patient presents with  . Weakness    HISTORY OF PRESENT ILLNESS:  Jose [name]  is a 84 y.o. male  presented with weakness and found to have sepsis   VITAL SIGNS:  Blood pressure 125/75, pulse 77, temperature 98.2 F (36.8 C), resp. rate 14, height 5\' 7"  (1.702 m), weight 56.9 kg, SpO2 95 %.    PHYSICAL EXAMINATION:  GENERAL:  84 y.o.-year-old patient lying in the bed with no acute distress.  EYES: Pupils equal, round, reactive to light and accommodation. No scleral icterus. Extraocular muscles intact.  HEENT: Head atraumatic, normocephalic. Oropharynx and nasopharynx clear.  NECK:  Supple, no jugular venous distention. No thyroid enlargement, no tenderness.  LUNGS: Normal breath sounds bilaterally, no wheezing, rales,rhonchi or crepitation. No use of accessory muscles of respiration.  CARDIOVASCULAR: S1, S2 normal. No murmurs, rubs, or gallops.  ABDOMEN: Soft, non-tender, non-distended. Bowel sounds present. No organomegaly or mass.  EXTREMITIES: No pedal edema, cyanosis, or clubbing.  NEUROLOGIC: Cranial nerves II through XII are intact. Muscle strength 5/5 in all extremities. Sensation intact. Gait not checked.  PSYCHIATRIC: The patient is alert and oriented x 3.  SKIN: No obvious rash, lesion, or ulcer.   DATA REVIEW:   CBC Recent Labs  Lab 03/17/18 0534  WBC 4.2  HGB 7.8*  HCT 23.8*  PLT 88*    Chemistries  Recent Labs  Lab 03/12/18 0732  03/17/18 0534  NA  --    < > 139  K  --    < > 3.0*  CL  --    < > 105  CO2  --    < > 29  GLUCOSE  --    < > 115*  BUN  --    < > 13  CREATININE  --    < > 0.70  CALCIUM  --    < > 7.7*  MG 2.0  --   --   AST  --    < > 44*  ALT  --    < > 49*  ALKPHOS  --    < > 314*  BILITOT  --    < > 0.9   < > = values in this interval not displayed.    Cardiac Enzymes Recent Labs  Lab 03/12/18 0732  TROPONINI 0.04*    Microbiology Results  Results for orders placed or performed during the hospital encounter of 03/11/18  Blood culture (routine x 2)     Status: Abnormal   Collection Time: 03/11/18  9:11 PM  Result Value  Ref Range Status   Specimen Description   Final    BLOOD BLOOD RIGHT FOREARM Performed at [facility], [address], [place], [place] [address]    Special Requests   Final    BOTTLES DRAWN AEROBIC AND ANAEROBIC Blood Culture adequate volume Performed at [facility], [address] [place], [place] [address]    Culture  Setup Time   Final    GRAM NEGATIVE RODS AEROBIC BOTTLE ONLY CRITICAL RESULT CALLED TO, READ BACK BY AND VERIFIED WITH: JASON ROBBINS 03/12/18 1616 KLW Performed at [facility], [place] [address] [place], [place] [address]    Culture (A)  Final    ESCHERICHIA COLI Confirmed Extended Spectrum Beta-Lactamase Producer (ESBL).  In bloodstream infections from ESBL organisms, carbapenems are preferred over piperacillin/tazobactam. They are shown to have a lower risk of mortality.    Report Status 03/14/2018 FINAL  Final   Organism ID, Bacteria ESCHERICHIA COLI  Final      Susceptibility   Escherichia coli - MIC*    AMPICILLIN >=32 RESISTANT Resistant     CEFAZOLIN >=64 RESISTANT Resistant     CEFEPIME RESISTANT Resistant     CEFTAZIDIME RESISTANT Resistant     CEFTRIAXONE RESISTANT Resistant     CIPROFLOXACIN <=0.25 SENSITIVE Sensitive     GENTAMICIN <=1 SENSITIVE Sensitive     IMIPENEM <=0.25 SENSITIVE Sensitive     TRIMETH/SULFA <=20 SENSITIVE Sensitive     AMPICILLIN/SULBACTAM 4 SENSITIVE Sensitive     PIP/TAZO <=4 SENSITIVE Sensitive     Extended ESBL POSITIVE Resistant     * ESCHERICHIA COLI  Blood culture (routine x 2)     Status: Abnormal   Collection Time: 03/11/18  9:11 PM  Result Value Ref Range Status   Specimen Description   Final    BLOOD BLOOD LEFT FOREARM Performed at [facility], [address], [place], [place] [address]    Special Requests   Final    BOTTLES DRAWN AEROBIC AND ANAEROBIC Blood Culture adequate  volume Performed at [facility], [address] [place], [place] [address]     Culture  Setup Time   Final    AEROBIC BOTTLE ONLY GRAM NEGATIVE RODS CRITICAL RESULT CALLED TO, READ BACK BY AND VERIFIED WITH: JASON ROBBINS 03/12/18 1616 KLW Performed at [facility], [address] [place], [place] [address]    Culture (A)  Final    ESCHERICHIA COLI SUSCEPTIBILITIES PERFORMED ON PREVIOUS CULTURE WITHIN THE LAST 5 DAYS. Performed at [facility], [place] [address], [place], [place] [address]    Report Status 03/14/2018 FINAL  Final  Blood Culture ID Panel (Reflexed)     Status: Abnormal   Collection Time: 03/11/18  9:11 PM  Result Value Ref Range Status   Enterococcus species NOT DETECTED NOT DETECTED Final   Listeria monocytogenes NOT DETECTED NOT DETECTED Final   Staphylococcus species NOT DETECTED NOT DETECTED Final   Staphylococcus aureus (BCID) NOT DETECTED NOT DETECTED Final   Streptococcus species NOT DETECTED NOT DETECTED Final   Streptococcus agalactiae NOT DETECTED NOT DETECTED Final   Streptococcus pneumoniae NOT DETECTED NOT DETECTED Final   Streptococcus pyogenes NOT DETECTED NOT DETECTED Final   Acinetobacter baumannii NOT DETECTED NOT DETECTED Final   Enterobacteriaceae species DETECTED (A) NOT DETECTED Final    Comment: Enterobacteriaceae represent a large family of gram-negative bacteria, not a single organism. CRITICAL RESULT CALLED TO, READ BACK BY AND VERIFIED WITH: JASON ROBBINS 03/12/18 1616 KLW    Enterobacter cloacae complex NOT DETECTED NOT DETECTED Final   Escherichia coli DETECTED (A) NOT DETECTED Final    Comment: CRITICAL RESULT CALLED TO, READ BACK BY AND VERIFIED WITH: JASON ROBBINS 03/12/18 1616 KLW    Klebsiella oxytoca NOT DETECTED NOT DETECTED Final   Klebsiella pneumoniae NOT DETECTED NOT DETECTED Final   Proteus species NOT DETECTED NOT DETECTED Final   Serratia marcescens NOT DETECTED NOT DETECTED Final   Carbapenem resistance NOT DETECTED NOT DETECTED Final   Haemophilus influenzae NOT DETECTED NOT  DETECTED Final   Neisseria meningitidis NOT DETECTED NOT DETECTED Final   Pseudomonas aeruginosa NOT DETECTED NOT DETECTED Final   Candida albicans NOT DETECTED NOT DETECTED Final   Candida glabrata NOT DETECTED NOT DETECTED Final   Candida krusei NOT DETECTED NOT DETECTED Final   Candida parapsilosis NOT DETECTED NOT DETECTED Final   Candida tropicalis NOT DETECTED NOT DETECTED Final    Comment: Performed at [facility], [address] [place], [place] [address]     Management plans discussed with the patient and he has wanted to go home for days now.  Spoke with son and he is protesting discharge.  Spoke with the son's wife who is in the medical field and explained everything to her also.  CODE STATUS:     Code Status Orders  (From admission, onward)         Start     Ordered   03/12/18 0245  Full code  Continuous     03/12/18 0244        Code Status History    Date Active Date Inactive Code Status Order ID Comments User Context   01/26/2018 1208 01/27/2018 1759 Full Code [id number]  Coralie Keens, MD Inpatient   01/04/2018 0821 01/05/2018 1940 Full Code [id number]  Herbert Pun, MD ED   12/27/2017 1338 12/30/2017 1723 Full Code [id number]  Benjamine Sprague, DO Inpatient    Advance Directive Documentation     Most Recent Value  Type of Advance Directive  Healthcare Power of Attorney, Living will  Pre-existing out of facility DNR order (yellow form or pink MOST form)  -  "MOST" Form in Place?  -      TOTAL TIME TAKING CARE OF THIS PATIENT: 45 minutes.    Loletha Grayer M.D on 03/17/2018 at 3:21 PM  Between 7am to 6pm - Pager - [phone]  After 6pm go to [email or web address] - password [email or web address]  [facility] [employer] [facility]  [phone]  CC: Primary care physician; [name], MD

## 2018-03-18 LAB — CBC
HEMATOCRIT: 27.4 % — AB (ref 39.0–52.0)
Hemoglobin: 8.9 g/dL — ABNORMAL LOW (ref 13.0–17.0)
MCH: 33.1 pg (ref 26.0–34.0)
MCHC: 32.5 g/dL (ref 30.0–36.0)
MCV: 101.9 fL — AB (ref 80.0–100.0)
Platelets: 102 10*3/uL — ABNORMAL LOW (ref 150–400)
RBC: 2.69 MIL/uL — ABNORMAL LOW (ref 4.22–5.81)
RDW: 14.8 % (ref 11.5–15.5)
WBC: 4.1 10*3/uL (ref 4.0–10.5)
nRBC: 0 % (ref 0.0–0.2)

## 2018-03-18 LAB — TYPE AND SCREEN
ABO/RH(D): A NEG
Antibody Screen: NEGATIVE

## 2018-03-18 NOTE — Care Management (Signed)
RNCM called into patient's room requested to speak to son, Jose [name].  Son states he is willing to take patient home with home health services and would like me to speak to his wife.  Spoke to Jose [name]'s wife and she is in [place] and will be back in town on Friday.  She is agreeable to patient discharge as long as home health can out out right away.  RNCM explained that home health has between 24-72 hrs. To see patient but that I would call [facility] and ask for patient ot please be seen asap.  She was agreeable to that.  She then asked what his latest hgb results were.  On 10-29 it was 7.8.  She thought it would be closer to 10 as it was 9.6 on 10-26.  Patient is on iron supplements.  Called Dr. Leslye Peer and explained daughter in laws concerns.  He ordered a stat hgb and T&S.  Notified Jose [name] and his wife.  Jose [name] stepped off floor and will be back after lunch.

## 2018-03-18 NOTE — Care Management (Signed)
hgb 8.9; called [name], patients daughter in law and she is happy with the result.  She states Sam her son will pick up patient around 4 pm today for discharge.  [facility] aware.  Asked Corene Cornea if he could ask the RN or PT to try to come out to the home asap after discharge.

## 2018-03-18 NOTE — Progress Notes (Signed)
Pt ambulated 2 laps around nurses station with walker and standby assist.  IV d/c'd and heart monitor removed. Pt dressed and ready for discharge.

## 2018-03-18 NOTE — Progress Notes (Signed)
Pt ambulated 2 laps around nurses station with walker and standby assist.

## 2018-03-18 NOTE — Progress Notes (Signed)
Discharge paperwork reviewed with patient and son.  Pt discharged via wheelchair.  All questions answered at this time.

## 2018-03-18 NOTE — Plan of Care (Signed)

## 2018-03-19 ENCOUNTER — Telehealth: Payer: Self-pay

## 2018-03-19 NOTE — Telephone Encounter (Signed)
Transition Care Management Follow-up Telephone Call Spoke with Teryl Lucy, daughter-in-law, ok per DPR. Patient not available for questions. Patient resting a lot.  Date discharged? 03/18/2018   How have you been since you were released from the hospital? Patient did well over night since D/C. Resting.   Do you understand why you were in the hospital? yes   Do you understand the discharge instructions? Not sure   Where were you discharged to? Home   Items Reviewed:  Medications reviewed: Yes  Allergies reviewed: Yes  Dietary changes reviewed: Yes-added Ensure and Life.  Referrals reviewed: Yes   Functional Questionnaire:   Activities of Daily Living (ADLs):   He states they are independent in the following: bathing and hygiene, toileting, grooming, dressing, feeding. States they require assistance with the following: ambulation-walker and cane.   Any transportation issues/concerns?: No   Any patient concerns? No. Resting a lot.    Confirmed importance and date/time of follow-up visits scheduled Yes  Provider Appointment booked with Dr Silvio Pate on 03/24/2018 at 11am.  Confirmed with patient if condition begins to worsen call PCP or go to the ER.  Patient was given the office number and encouraged to call back with question or concerns.  : Yes

## 2018-03-20 ENCOUNTER — Telehealth: Payer: Self-pay

## 2018-03-20 NOTE — Telephone Encounter (Signed)
Jose [name] with [facility] requesting home health orders for nurse visits 1 x a week for 1 week for referral done today; 2 x a week for 3 weeks ; 1 x a week for 4 weeks and 2 prn visits. Also Jose [name] wants to know if OK to have orders from other doctors if needed.

## 2018-03-20 NOTE — Telephone Encounter (Signed)
Those orders okay Not sure what other doctors might be giving orders--- I would need specifics

## 2018-03-23 ENCOUNTER — Inpatient Hospital Stay: Payer: Medicare Other | Admitting: Internal Medicine

## 2018-03-23 NOTE — Telephone Encounter (Signed)
Left detailed message on VM for Jose [name]. Asked that she clarify "other orders from other doctors".

## 2018-03-24 ENCOUNTER — Ambulatory Visit (INDEPENDENT_AMBULATORY_CARE_PROVIDER_SITE_OTHER): Payer: Medicare Other | Admitting: Internal Medicine

## 2018-03-24 ENCOUNTER — Encounter: Payer: Self-pay | Admitting: Internal Medicine

## 2018-03-24 ENCOUNTER — Telehealth: Payer: Self-pay | Admitting: Internal Medicine

## 2018-03-24 VITALS — BP 116/74 | HR 76 | Temp 97.6°F | Ht 67.0 in | Wt 127.0 lb

## 2018-03-24 DIAGNOSIS — K851 Biliary acute pancreatitis without necrosis or infection: Secondary | ICD-10-CM

## 2018-03-24 DIAGNOSIS — R000 Tachycardia, unspecified: Secondary | ICD-10-CM

## 2018-03-24 DIAGNOSIS — A4151 Sepsis due to Escherichia coli [E. coli]: Secondary | ICD-10-CM | POA: Diagnosis not present

## 2018-03-24 DIAGNOSIS — D649 Anemia, unspecified: Secondary | ICD-10-CM | POA: Diagnosis not present

## 2018-03-24 DIAGNOSIS — M170 Bilateral primary osteoarthritis of knee: Secondary | ICD-10-CM

## 2018-03-24 NOTE — Telephone Encounter (Signed)
Verbal orders given to Chris 

## 2018-03-24 NOTE — Assessment & Plan Note (Signed)
This is better Stenting of stricture in bile duct at ERCP---will need stent out in 2 months

## 2018-03-24 NOTE — Assessment & Plan Note (Signed)
If better at follow up---will inject knees with cortisone next time

## 2018-03-24 NOTE — Assessment & Plan Note (Signed)
Will recheck next time

## 2018-03-24 NOTE — Telephone Encounter (Signed)
Left another message for Jose [name] asking her to clarify what she meant about orders from other providers.

## 2018-03-24 NOTE — Assessment & Plan Note (Signed)
Positive blood culture and acute illness Parenteral antibiotics and now on the last few doses of oral levofloxacin

## 2018-03-24 NOTE — Telephone Encounter (Signed)
okay

## 2018-03-24 NOTE — Progress Notes (Signed)
Subjective:    Patient ID: Jose [name], male    DOB: [birth year], 84 y.o.   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 acetaminophen (TYLENOL) 500 MG tablet Take 1,000 mg by mouth every 6 (six) hours as needed.    . feeding supplement, ENSURE ENLIVE, (ENSURE ENLIVE) LIQD Take 6 mLs by mouth 2 (two) times daily between meals. 60 Bottle 0  . ferrous sulfate 325 (65 FE) MG EC tablet Take 325 mg by mouth daily with breakfast.    . gabapentin (NEURONTIN) 600 MG tablet TAKE 1 TABLET (600 MG TOTAL) BY MOUTH 2 (TWO) TIMES DAILY. (Patient taking differently: Take 600 mg by mouth 2 (two) times daily. ) 180 tablet 3  . ketoconazole (NIZORAL) 2 % cream Apply 1 application topically 2 (two) times daily as needed for irritation. 60 g 1  . levofloxacin (LEVAQUIN) 500 MG tablet Take 1 tablet (500 mg total) by mouth daily. 7 tablet 0  . metoprolol succinate (TOPROL-XL) 25 MG 24 hr tablet Take 1 tablet (25 mg total) by mouth daily. 30 tablet 0  . Multiple Vitamin (MULTIVITAMIN WITH MINERALS) TABS tablet Take 1 tablet by mouth daily. 30 tablet 0  . potassium chloride SA (K-DUR,KLOR-CON) 20 MEQ tablet Take 2 tablets (40 mEq total) by mouth daily. (Patient taking differently:  Take 40 mEq by mouth 2 (two) times daily. ) 60 tablet 11  . tamsulosin (FLOMAX) 0.4 MG CAPS capsule TAKE 1 CAPSULE (0.4 MG TOTAL) BY MOUTH DAILY. (Patient taking differently: Take 0.4 mg by mouth daily. ) 90 capsule 3  . triamcinolone cream (KENALOG) 0.1 % Apply 1 application topically 2 (two) times daily as needed. (Patient taking differently: Apply 1 application topically 2 (two) times daily as needed (irritation). ) 30 g 1  . vitamin B-12 (CYANOCOBALAMIN) 500 MCG tablet Take 500 mcg by mouth daily.     No current facility-administered medications on file prior to visit.     No Known Allergies  Past Medical History:  Diagnosis Date  . Anxiety   . Back problem 1960  . BPH (benign prostatic hypertrophy)   . Cervical radiculopathy    with neuropathy  . Dyspnea   . GERD (gastroesophageal reflux disease)   . Hypertension   . Hypokalemia   . Melanoma in situ ([place]) 12/2016   neck    Past Surgical History:  Procedure Laterality Date  . CATARACT EXTRACTION     OD  . CHOLECYSTECTOMY N/A 01/26/2018   Procedure: LAPAROSCOPIC CHOLECYSTECTOMY;  Surgeon: Coralie Keens, MD;  Location: [facility];  Service: General;  Laterality: N/A;  . ENDOSCOPIC RETROGRADE CHOLANGIOPANCREATOGRAPHY (ERCP) WITH PROPOFOL N/A 03/13/2018   Procedure: ENDOSCOPIC RETROGRADE CHOLANGIOPANCREATOGRAPHY (ERCP) WITH PROPOFOL;  Surgeon: Lucilla Lame, MD;  Location:  [facility];  Service: Endoscopy;  Laterality: N/A;  . IR PERC CHOLECYSTOSTOMY  12/28/2017  . LAPAROSCOPIC CHOLECYSTECTOMY  01/26/2018  . MELANOMA EXCISION  12/2016   in situ---Dr Dasher  . TONSILLECTOMY      Family History  Problem Relation Age of Onset  . Cancer Son   . Coronary artery disease Neg Hx   . Diabetes Neg Hx     Social History   Socioeconomic History  . Marital status: Widowed    Spouse name: Not on file  . Number of children: 2  . Years of education: Not on file  . Highest education level: Not on file  Occupational History  .  Occupation: retiredOccupational psychologist [employer]    Employer: RETIRED  Social Needs  . Financial resource strain: Not on file  . Food insecurity:    Worry: Not on file    Inability: Not on file  . Transportation needs:    Medical: Not on file    Non-medical: Not on file  Tobacco Use  . Smoking status: Never Smoker  . Smokeless tobacco: Never Used  Substance and Sexual Activity  . Alcohol use: No  . Drug use: No  . Sexual activity: Not on file  Lifestyle  . Physical activity:    Days per week: Not on file    Minutes per session: Not on file  . Stress: Not on file  Relationships  . Social connections:    Talks on phone: Patient refused    Gets together: Patient refused    Attends religious service: Patient refused    Active member of club or organization: Patient refused    Attends meetings of clubs or organizations: Patient refused    Relationship status: Patient refused  . Intimate partner violence:    Fear of current or ex partner: Patient refused    Emotionally abused: Patient refused    Physically abused: Patient refused    Forced sexual activity: Patient refused  Other Topics Concern  . Not on file  Social History Narrative   Has living will   Son Mikeal Hawthorne is health care POA.     Requests DNR--done 05/07/16   No feeding tube if cognitively unaware   Review of Systems Has lost a few pounds Lots of pain in his knees--discussed using tylenol at bedtime Legs are jumping at night---he is taking the iron supplement No cough or SOB No sig dizziness    Objective:   Physical Exam  Constitutional: He appears well-developed. No distress.  Neck: No thyromegaly present.  Cardiovascular: Normal rate, regular rhythm and normal heart sounds. Exam reveals no gallop.  No murmur heard. HR ~96  Respiratory: Effort normal. No respiratory distress. He has no wheezes. He has no rales.  Decreased breath sounds at bases---otherwise clear  GI: Soft. Bowel sounds are normal. He exhibits no  distension. There is no tenderness. There is no rebound and no guarding.  Musculoskeletal: He exhibits no edema.  Knees are thickened  Lymphadenopathy:    He has no cervical adenopathy.           Assessment & Plan:

## 2018-03-24 NOTE — Telephone Encounter (Signed)
Best number [phone]  Gerald Stabs with [facility] Needing verbal orders PT 2x week for 4 weeks

## 2018-03-24 NOTE — Assessment & Plan Note (Signed)
Likely related to illness and anemia Will continue the beta blocker for now--reconsider at follow up

## 2018-03-26 ENCOUNTER — Encounter: Payer: Self-pay | Admitting: Internal Medicine

## 2018-03-26 ENCOUNTER — Telehealth: Payer: Self-pay | Admitting: Internal Medicine

## 2018-03-26 ENCOUNTER — Ambulatory Visit (INDEPENDENT_AMBULATORY_CARE_PROVIDER_SITE_OTHER): Payer: Medicare Other | Admitting: Internal Medicine

## 2018-03-26 VITALS — BP 110/70 | HR 70 | Temp 99.2°F | Ht 67.0 in

## 2018-03-26 DIAGNOSIS — R5383 Other fatigue: Secondary | ICD-10-CM | POA: Diagnosis not present

## 2018-03-26 DIAGNOSIS — K2100 Gastro-esophageal reflux disease with esophagitis, without bleeding: Secondary | ICD-10-CM

## 2018-03-26 MED ORDER — OMEPRAZOLE 20 MG PO CPDR: 20.0000 mg | DELAYED_RELEASE_CAPSULE | Freq: Every day | ORAL | 3 refills | Status: DC

## 2018-03-26 NOTE — Telephone Encounter (Signed)
Not needed

## 2018-03-26 NOTE — Assessment & Plan Note (Signed)
Much more symptoms lately Will give Rx for omeprazole

## 2018-03-26 NOTE — Assessment & Plan Note (Signed)
Likely he just overdid it yesterday (since he felt a little better) Tachycardic due to the anemia Will just recheck labs

## 2018-03-26 NOTE — Telephone Encounter (Signed)
I spoke to [place] and she said something has to be done. She is still having "pain" in the sternum area. Still having weakness. He looks pale today and weaker this morning. Could it be indigestion with the "chest pain". Today was the last day of antibiotics. Urine is really dark but he is not drinking a lot. His HR was 122. He was feeling pretty good yesterday. Got out walking around. He has a visible decline since his OV on 03-24-18. She does want him seen today. He did take a Pepcid this morning and said his chest felt better.

## 2018-03-26 NOTE — Progress Notes (Signed)
Subjective:    Patient ID: Jose [name], male    DOB: [birth year], 84 y.o.   MRN: [id number]  HPI Here due to fatigue With son and DIL  Had good day yesterday but then started feeling bad Got choked sensation after eating Some heartburn in past Some trouble swallowing with this  No real abdominal pain No vomiting BM every day--no blood  Really feels weak Yesterday was strong--- walked to shop, around the yard Wasn't using cane  Current Outpatient Medications on File Prior to Visit  Medication Sig Dispense Refill  . acetaminophen (TYLENOL) 500 MG tablet Take 1,000 mg by mouth every 6 (six) hours as needed.    . feeding supplement, ENSURE ENLIVE, (ENSURE ENLIVE) LIQD Take 6 mLs by mouth 2 (two) times daily between meals. 60 Bottle 0  . ferrous sulfate 325 (65 FE) MG EC tablet Take 325 mg by mouth daily with breakfast.    . gabapentin (NEURONTIN) 600 MG tablet TAKE 1 TABLET (600 MG TOTAL) BY MOUTH 2 (TWO) TIMES DAILY. (Patient taking differently: Take 600 mg by mouth 2 (two) times daily. ) 180 tablet 3  . ketoconazole (NIZORAL) 2 % cream Apply 1 application topically 2 (two) times daily as needed for irritation. 60 g 1  . metoprolol succinate (TOPROL-XL) 25 MG 24 hr tablet Take 1 tablet (25 mg total) by mouth daily. 30 tablet 0  . Multiple Vitamin (MULTIVITAMIN WITH MINERALS) TABS tablet Take 1 tablet by mouth daily. 30 tablet 0  . potassium chloride SA (K-DUR,KLOR-CON) 20 MEQ tablet Take 2 tablets (40 mEq total) by mouth daily. (Patient taking differently: Take 40 mEq by mouth 2 (two) times daily. ) 60 tablet 11  . tamsulosin (FLOMAX) 0.4 MG CAPS capsule TAKE 1 CAPSULE (0.4 MG TOTAL) BY MOUTH DAILY. (Patient taking differently: Take 0.4 mg by mouth daily. ) 90 capsule 3  . triamcinolone cream (KENALOG) 0.1 % Apply 1 application topically 2 (two) times daily as needed. (Patient taking differently: Apply 1 application topically 2 (two) times daily as needed (irritation). ) 30 g 1    . vitamin B-12 (CYANOCOBALAMIN) 500 MCG tablet Take 500 mcg by mouth daily.     No current facility-administered medications on file prior to visit.     No Known Allergies  Past Medical History:  Diagnosis Date  . Anxiety   . Back problem 1960  . BPH (benign prostatic hypertrophy)   . Cervical radiculopathy    with neuropathy  . Dyspnea   . GERD (gastroesophageal reflux disease)   . Hypertension   . Hypokalemia   . Melanoma in situ ([place]) 12/2016   neck    Past Surgical History:  Procedure Laterality Date  . CATARACT EXTRACTION     OD  . CHOLECYSTECTOMY N/A 01/26/2018   Procedure: LAPAROSCOPIC CHOLECYSTECTOMY;  Surgeon: Coralie Keens, MD;  Location: [place];  Service: General;  Laterality: N/A;  . ENDOSCOPIC RETROGRADE CHOLANGIOPANCREATOGRAPHY (ERCP) WITH PROPOFOL N/A 03/13/2018   Procedure: ENDOSCOPIC RETROGRADE CHOLANGIOPANCREATOGRAPHY (ERCP) WITH PROPOFOL;  Surgeon: Lucilla Lame, MD;  Location: [facility] ENDOSCOPY;  Service: Endoscopy;  Laterality: N/A;  . IR PERC CHOLECYSTOSTOMY  12/28/2017  . LAPAROSCOPIC CHOLECYSTECTOMY  01/26/2018  . MELANOMA EXCISION  12/2016   in situ---Dr Dasher  . TONSILLECTOMY      Family History  Problem Relation Age of Onset  . Cancer Son   . Coronary artery disease Neg Hx   . Diabetes Neg Hx     Social History   Socioeconomic History  .  Marital status: Widowed    Spouse name: Not on file  . Number of children: 2  . Years of education: Not on file  . Highest education level: Not on file  Occupational History  . Occupation: retiredOccupational psychologist [employer]    Employer: RETIRED  Social Needs  . Financial resource strain: Not on file  . Food insecurity:    Worry: Not on file    Inability: Not on file  . Transportation needs:    Medical: Not on file    Non-medical: Not on file  Tobacco Use  . Smoking status: Never Smoker  . Smokeless tobacco: Never Used  Substance and Sexual Activity  . Alcohol use: No  . Drug use: No  . Sexual  activity: Not on file  Lifestyle  . Physical activity:    Days per week: Not on file    Minutes per session: Not on file  . Stress: Not on file  Relationships  . Social connections:    Talks on phone: Patient refused    Gets together: Patient refused    Attends religious service: Patient refused    Active member of club or organization: Patient refused    Attends meetings of clubs or organizations: Patient refused    Relationship status: Patient refused  . Intimate partner violence:    Fear of current or ex partner: Patient refused    Emotionally abused: Patient refused    Physically abused: Patient refused    Forced sexual activity: Patient refused  Other Topics Concern  . Not on file  Social History Narrative   Has living will   Son Mikeal Hawthorne is health care POA.     Requests DNR--done 05/07/16   No feeding tube if cognitively unaware   Review of Systems Some pains in feet    Objective:   Physical Exam  Constitutional: No distress.  Neck: No thyromegaly present.  Cardiovascular: Regular rhythm. Exam reveals no gallop.  No murmur heard. HR ~110  Respiratory: Effort normal and breath sounds normal. No respiratory distress. He has no wheezes. He has no rales.  Musculoskeletal: He exhibits no edema.  Lymphadenopathy:    He has no cervical adenopathy.           Assessment & Plan:

## 2018-03-27 ENCOUNTER — Inpatient Hospital Stay (HOSPITAL_COMMUNITY)
Admission: EM | Admit: 2018-03-27 | Discharge: 2018-04-03 | DRG: 438 | Disposition: A | Discharge status: Home Health | Payer: Medicare Other | Attending: Internal Medicine | Admitting: Internal Medicine

## 2018-03-27 ENCOUNTER — Encounter (HOSPITAL_COMMUNITY): Payer: Self-pay | Admitting: Emergency Medicine

## 2018-03-27 ENCOUNTER — Emergency Department (HOSPITAL_COMMUNITY): Payer: Medicare Other

## 2018-03-27 ENCOUNTER — Telehealth: Payer: Self-pay | Admitting: Gastroenterology

## 2018-03-27 ENCOUNTER — Other Ambulatory Visit: Payer: Self-pay

## 2018-03-27 DIAGNOSIS — E876 Hypokalemia: Secondary | ICD-10-CM | POA: Diagnosis present

## 2018-03-27 DIAGNOSIS — D649 Anemia, unspecified: Secondary | ICD-10-CM

## 2018-03-27 DIAGNOSIS — Y848 Other medical procedures as the cause of abnormal reaction of the patient, or of later complication, without mention of misadventure at the time of the procedure: Secondary | ICD-10-CM | POA: Diagnosis present

## 2018-03-27 DIAGNOSIS — T85520A Displacement of bile duct prosthesis, initial encounter: Secondary | ICD-10-CM

## 2018-03-27 DIAGNOSIS — D539 Nutritional anemia, unspecified: Secondary | ICD-10-CM | POA: Diagnosis present

## 2018-03-27 DIAGNOSIS — R000 Tachycardia, unspecified: Secondary | ICD-10-CM | POA: Diagnosis present

## 2018-03-27 DIAGNOSIS — Z8619 Personal history of other infectious and parasitic diseases: Secondary | ICD-10-CM | POA: Diagnosis present

## 2018-03-27 DIAGNOSIS — K8309 Other cholangitis: Secondary | ICD-10-CM | POA: Diagnosis present

## 2018-03-27 DIAGNOSIS — I10 Essential (primary) hypertension: Secondary | ICD-10-CM | POA: Diagnosis present

## 2018-03-27 DIAGNOSIS — N401 Enlarged prostate with lower urinary tract symptoms: Secondary | ICD-10-CM | POA: Diagnosis present

## 2018-03-27 DIAGNOSIS — K859 Acute pancreatitis without necrosis or infection, unspecified: Secondary | ICD-10-CM | POA: Diagnosis not present

## 2018-03-27 DIAGNOSIS — K831 Obstruction of bile duct: Secondary | ICD-10-CM | POA: Diagnosis present

## 2018-03-27 DIAGNOSIS — Z86006 Personal history of melanoma in-situ: Secondary | ICD-10-CM

## 2018-03-27 DIAGNOSIS — K219 Gastro-esophageal reflux disease without esophagitis: Secondary | ICD-10-CM | POA: Diagnosis present

## 2018-03-27 DIAGNOSIS — N400 Enlarged prostate without lower urinary tract symptoms: Secondary | ICD-10-CM

## 2018-03-27 DIAGNOSIS — A4151 Sepsis due to Escherichia coli [E. coli]: Secondary | ICD-10-CM | POA: Diagnosis not present

## 2018-03-27 DIAGNOSIS — M6281 Muscle weakness (generalized): Secondary | ICD-10-CM | POA: Diagnosis not present

## 2018-03-27 DIAGNOSIS — F419 Anxiety disorder, unspecified: Secondary | ICD-10-CM

## 2018-03-27 DIAGNOSIS — K839 Disease of biliary tract, unspecified: Secondary | ICD-10-CM

## 2018-03-27 DIAGNOSIS — E871 Hypo-osmolality and hyponatremia: Secondary | ICD-10-CM | POA: Diagnosis present

## 2018-03-27 DIAGNOSIS — R748 Abnormal levels of other serum enzymes: Secondary | ICD-10-CM | POA: Diagnosis present

## 2018-03-27 DIAGNOSIS — D638 Anemia in other chronic diseases classified elsewhere: Secondary | ICD-10-CM | POA: Diagnosis present

## 2018-03-27 DIAGNOSIS — M5412 Radiculopathy, cervical region: Secondary | ICD-10-CM

## 2018-03-27 DIAGNOSIS — D509 Iron deficiency anemia, unspecified: Secondary | ICD-10-CM | POA: Diagnosis present

## 2018-03-27 LAB — COMPREHENSIVE METABOLIC PANEL
ALBUMIN: 3.2 g/dL — AB (ref 3.5–5.0)
ALK PHOS: 691 U/L — AB (ref 39–117)
ALK PHOS: 558 U/L — AB (ref 38–126)
ALT: 199 U/L — AB (ref 0–53)
ALT: 151 U/L — AB (ref 0–44)
AST: 197 U/L — AB (ref 0–37)
AST: 92 U/L — AB (ref 15–41)
Albumin: 3.7 g/dL (ref 3.5–5.2)
Anion gap: 10 (ref 5–15)
BILIRUBIN TOTAL: 4.7 mg/dL — AB (ref 0.2–1.2)
BILIRUBIN TOTAL: 4.7 mg/dL — AB (ref 0.3–1.2)
BUN: 21 mg/dL (ref 6–23)
BUN: 23 mg/dL (ref 8–23)
CALCIUM: 9.2 mg/dL (ref 8.9–10.3)
CO2: 24 mEq/L (ref 19–32)
CO2: 20 mmol/L — ABNORMAL LOW (ref 22–32)
CREATININE: 1.03 mg/dL (ref 0.61–1.24)
Calcium: 9.6 mg/dL (ref 8.4–10.5)
Chloride: 102 mEq/L (ref 96–112)
Chloride: 106 mmol/L (ref 98–111)
Creatinine, Ser: 0.95 mg/dL (ref 0.40–1.50)
GFR calc Af Amer: >60 mL/min (ref >60)
GFR: 78.75 mL/min (ref >60.00)
GLUCOSE: 130 mg/dL — AB (ref 70–99)
GLUCOSE: 117 mg/dL — AB (ref 70–99)
Potassium: 5.4 mEq/L — ABNORMAL HIGH (ref 3.5–5.1)
Potassium: 4.7 mmol/L (ref 3.5–5.1)
SODIUM: 135 meq/L (ref 135–145)
Sodium: 136 mmol/L (ref 135–145)
TOTAL PROTEIN: 6.9 g/dL (ref 6.0–8.3)
TOTAL PROTEIN: 6.7 g/dL (ref 6.5–8.1)

## 2018-03-27 LAB — CBC
HCT: 35.1 % — ABNORMAL LOW (ref 39.0–52.0)
Hemoglobin: 11.8 g/dL — ABNORMAL LOW (ref 13.0–17.0)
MCHC: 33.5 g/dL (ref 30.0–36.0)
MCV: 99.1 fl (ref 78.0–100.0)
Platelets: 199 10*3/uL (ref 150.0–400.0)
RBC: 3.55 Mil/uL — ABNORMAL LOW (ref 4.22–5.81)
RDW: 16.3 % — AB (ref 11.5–15.5)
WBC: 14.5 10*3/uL — ABNORMAL HIGH (ref 4.0–10.5)

## 2018-03-27 LAB — CBC WITH DIFFERENTIAL/PLATELET
Abs Immature Granulocytes: 0.06 10*3/uL (ref 0.00–0.07)
BASOS ABS: 0 10*3/uL (ref 0.0–0.1)
Basophils Relative: 0 %
EOS ABS: 0.4 10*3/uL (ref 0.0–0.5)
Eosinophils Relative: 4 %
HEMATOCRIT: 33.1 % — AB (ref 39.0–52.0)
HEMOGLOBIN: 10.2 g/dL — AB (ref 13.0–17.0)
IMMATURE GRANULOCYTES: 1 %
LYMPHS ABS: 0.6 10*3/uL — AB (ref 0.7–4.0)
LYMPHS PCT: 6 %
MCH: 32.1 pg (ref 26.0–34.0)
MCHC: 30.8 g/dL (ref 30.0–36.0)
MCV: 104.1 fL — AB (ref 80.0–100.0)
MONOS PCT: 10 %
Monocytes Absolute: 1 10*3/uL (ref 0.1–1.0)
NEUTROS PCT: 79 %
NRBC: 0 % (ref 0.0–0.2)
Neutro Abs: 7.5 10*3/uL (ref 1.7–7.7)
Platelets: 177 10*3/uL (ref 150–400)
RBC: 3.18 MIL/uL — ABNORMAL LOW (ref 4.22–5.81)
RDW: 15.6 % — ABNORMAL HIGH (ref 11.5–15.5)
WBC: 9.6 10*3/uL (ref 4.0–10.5)

## 2018-03-27 LAB — URINALYSIS, ROUTINE W REFLEX MICROSCOPIC
Bilirubin Urine: NEGATIVE
GLUCOSE, UA: NEGATIVE mg/dL
Hgb urine dipstick: NEGATIVE
KETONES UR: NEGATIVE mg/dL
LEUKOCYTES UA: NEGATIVE
NITRITE: NEGATIVE
Protein, ur: NEGATIVE mg/dL
Specific Gravity, Urine: 1.018 (ref 1.005–1.030)
pH: 6 (ref 5.0–8.0)

## 2018-03-27 LAB — MAGNESIUM: Magnesium: 2 mg/dL (ref 1.7–2.4)

## 2018-03-27 LAB — AMMONIA: AMMONIA: 37 umol/L — AB (ref 9–35)

## 2018-03-27 LAB — BILIRUBIN, DIRECT: Bilirubin, Direct: 3.4 mg/dL — ABNORMAL HIGH (ref 0.0–0.2)

## 2018-03-27 MED ORDER — SODIUM CHLORIDE 0.9 % IV SOLN: Freq: Once | INTRAVENOUS | Status: DC

## 2018-03-27 MED ORDER — IOHEXOL 300 MG/ML  SOLN
100.0000 mL | Freq: Once | INTRAMUSCULAR | Status: AC | PRN
  Administered 2018-03-27: 100 mL via INTRAVENOUS

## 2018-03-27 NOTE — Telephone Encounter (Signed)
Pt daughter in law left vm regarding pt having a stint put in 03/13/18 and pt PCVP states the stint is stooped up again she needs a call for direction please call

## 2018-03-27 NOTE — ED Provider Notes (Signed)
I saw and evaluated the patient, reviewed the resident's note and I agree with the findings and plan.  EKG: None 84 year old male here for increased weakness.  Had blood work performed which showed an elevated bilirubin.  He is also been jaundiced.  Abdominal exam is benign at this time.  Will obtain CT of abdomen and labs which are pending at this time   Lacretia Leigh, MD 03/27/18 1942

## 2018-03-27 NOTE — ED Triage Notes (Signed)
Pt and family reports going to pcp for weakness. Was called and told that bilirubin was elevated. Appears slightly jaundice and reprots skin itching.

## 2018-03-27 NOTE — ED Provider Notes (Signed)
[facility] EMERGENCY DEPARTMENT Provider Note   CSN: [id number] Arrival date & time: 03/27/18  1807     History   Chief Complaint Chief Complaint  Patient presents with  . Abnormal Lab  . Weakness    HPI Jose [name] is a 84 y.o. male.  HPI Patient is a 84 year old male with past medical history of hypertension, hypokalemia, GERD, and recent cholecystectomy with multiple complications resulting in ERCP and stent placement presents emerged department for evaluation of generalized weakness and an abnormal lab collected yesterday.  Patient reportedly underwent cholecystectomy for cholecystitis approximately 3 weeks ago.  Secondary to complications that arise from this procedure he had to undergo ERCP with stent placement.  Has been doing well since that time however for the past 2 to 3 days he has noticed increasing generalized weakness.  He was seen by his primary care physician yesterday at which time labs were drawn.  Patient's labs resulted this morning and showed elevated bilirubin and as a result patient was instructed to come to the emergency department for further evaluation.  Upon arrival to the emergency department patient is in no acute distress.  He does endorse that he is felt more weak than normal but has no other complaints.  He has had some mild itching over his abdomen, and his daughter at bedside does report that his color seems different than normal.  Remaining review of systems is as below.  Past Medical History:  Diagnosis Date  . Anxiety   . Back problem 1960  . BPH (benign prostatic hypertrophy)   . Cervical radiculopathy    with neuropathy  . Dyspnea   . GERD (gastroesophageal reflux disease)   . Hypertension   . Hypokalemia   . Melanoma in situ [facility]) 12/2016   neck    Patient Active Problem List   Diagnosis Date Noted  . Fatigue 03/26/2018  . E. coli sepsis ([place]) 03/24/2018  . Tachycardia 03/24/2018  . Malnutrition of moderate  degree 03/14/2018  . Jaundice   . Acute pancreatitis 03/12/2018  . Malnutrition of mild degree ([place]) 01/06/2018  . Elevated liver enzymes 01/04/2018  . Melanoma of neck ([place])   . Rash 09/05/2016  . Neuropathy 05/07/2016  . Anemia 05/11/2015  . Preventative health care 04/29/2014  . Advanced directives, counseling/discussion 04/29/2014  . Osteoarthrosis involving multiple sites 10/31/2010  . Osteoarthrosis of knee 10/31/2010  . ACTINIC KERATOSIS 12/04/2007  . BPH with obstruction/lower urinary tract symptoms 10/01/2006  . Essential hypertension, benign 09/17/2006  . GERD 09/17/2006  . Cervical radiculopathy 09/17/2006    Past Surgical History:  Procedure Laterality Date  . CATARACT EXTRACTION     OD  . CHOLECYSTECTOMY N/A 01/26/2018   Procedure: LAPAROSCOPIC CHOLECYSTECTOMY;  Surgeon: Coralie Keens, MD;  Location: [place];  Service: General;  Laterality: N/A;  . ENDOSCOPIC RETROGRADE CHOLANGIOPANCREATOGRAPHY (ERCP) WITH PROPOFOL N/A 03/13/2018   Procedure: ENDOSCOPIC RETROGRADE CHOLANGIOPANCREATOGRAPHY (ERCP) WITH PROPOFOL;  Surgeon: Lucilla Lame, MD;  Location: [facility] ENDOSCOPY;  Service: Endoscopy;  Laterality: N/A;  . IR PERC CHOLECYSTOSTOMY  12/28/2017  . LAPAROSCOPIC CHOLECYSTECTOMY  01/26/2018  . MELANOMA EXCISION  12/2016   in situ---Dr Dasher  . TONSILLECTOMY          Home Medications    Prior to Admission medications   Medication Sig Start Date End Date Taking? Authorizing Provider  acetaminophen (TYLENOL) 500 MG tablet Take 1,000 mg by mouth every 6 (six) hours as needed.    [provider]  feeding supplement,  ENSURE ENLIVE, (ENSURE ENLIVE) LIQD Take 6 mLs by mouth 2 (two) times daily between meals. 03/17/18   Loletha Grayer, MD  ferrous sulfate 325 (65 FE) MG EC tablet Take 325 mg by mouth daily with breakfast.    [provider]  gabapentin (NEURONTIN) 600 MG tablet TAKE 1 TABLET (600 MG TOTAL) BY MOUTH 2 (TWO) TIMES DAILY. Patient taking  differently: Take 600 mg by mouth 2 (two) times daily.  07/21/17   [name], MD  ketoconazole (NIZORAL) 2 % cream Apply 1 application topically 2 (two) times daily as needed for irritation. 05/07/17   [name], MD  metoprolol succinate (TOPROL-XL) 25 MG 24 hr tablet Take 1 tablet (25 mg total) by mouth daily. 03/17/18   Loletha Grayer, MD  Multiple Vitamin (MULTIVITAMIN WITH MINERALS) TABS tablet Take 1 tablet by mouth daily. 03/17/18   Loletha Grayer, MD  omeprazole (PRILOSEC) 20 MG capsule Take 1 capsule (20 mg total) by mouth daily. 03/26/18   [name], MD  potassium chloride SA (K-DUR,KLOR-CON) 20 MEQ tablet Take 2 tablets (40 mEq total) by mouth daily. Patient taking differently: Take 40 mEq by mouth 2 (two) times daily.  01/06/18   [name], MD  tamsulosin (FLOMAX) 0.4 MG CAPS capsule TAKE 1 CAPSULE (0.4 MG TOTAL) BY MOUTH DAILY. Patient taking differently: Take 0.4 mg by mouth daily.  07/21/17   [name], MD  triamcinolone cream (KENALOG) 0.1 % Apply 1 application topically 2 (two) times daily as needed. Patient taking differently: Apply 1 application topically 2 (two) times daily as needed (irritation).  09/05/16   [name], MD  vitamin B-12 (CYANOCOBALAMIN) 500 MCG tablet Take 500 mcg by mouth daily.    [provider]    Family History Family History  Problem Relation Age of Onset  . Cancer Son   . Coronary artery disease Neg Hx   . Diabetes Neg Hx     Social History Social History   Tobacco Use  . Smoking status: Never Smoker  . Smokeless tobacco: Never Used  Substance Use Topics  . Alcohol use: No  . Drug use: No     Allergies   Patient has no known allergies.   Review of Systems Review of Systems  Constitutional: Positive for fatigue. Negative for chills and fever.  HENT: Negative for ear pain and sore throat.   Eyes: Negative for pain and visual disturbance.  Respiratory: Negative for cough and  shortness of breath.   Cardiovascular: Negative for chest pain and palpitations.  Gastrointestinal: Negative for abdominal pain and vomiting.  Genitourinary: Negative for dysuria and hematuria.  Musculoskeletal: Negative for arthralgias and back pain.  Skin: Negative for color change and rash.  Neurological: Negative for seizures and syncope.  All other systems reviewed and are negative.    Physical Exam Updated Vital Signs BP (!) 171/101 (BP Location: Right Arm)   Pulse 92   Temp 97.7 F (36.5 C) (Oral)   Resp 20   SpO2 100%   Physical Exam  Constitutional: He is oriented to person, place, and time. He appears well-developed and well-nourished.  HENT:  Head: Normocephalic and atraumatic.  Eyes: Conjunctivae are normal. Scleral icterus is present.  Neck: Neck supple.  Cardiovascular: Normal rate and regular rhythm.  No murmur heard. Pulmonary/Chest: Effort normal and breath sounds normal. No respiratory distress.  Abdominal: Soft. He exhibits no distension. There is no tenderness. There is no guarding.  Musculoskeletal: He exhibits no edema.  Neurological: He is alert and oriented to person, place, and time.  Patient is hard of hearing, otherwise normal neurological exam.  Skin: Skin is warm and dry.  Patient does appear jaundiced.  Psychiatric: He has a normal mood and affect.  Nursing note and vitals reviewed.    ED Treatments / Results  Labs (all labs ordered are listed, but only abnormal results are displayed) Labs Reviewed  URINALYSIS, ROUTINE W REFLEX MICROSCOPIC  CBC WITH DIFFERENTIAL/PLATELET  COMPREHENSIVE METABOLIC PANEL  AMMONIA  BILIRUBIN, DIRECT  MAGNESIUM    EKG None  Radiology No results found.  Procedures Procedures (including critical care time)  Medications Ordered in ED Medications - No data to display   Initial Impression / Assessment and Plan / ED Course  I have reviewed the triage vital signs and the nursing notes.  Pertinent  labs & imaging results that were available during my care of the patient were reviewed by me and considered in my medical decision making (see chart for details).     Patient is a 84 year old male with a past medical history as detailed above who presents the emergency department for evaluation of generalized weakness and jaundice.  Patient has had multiple procedures related to his gallbladder over the past few weeks.  He initially had a cholecystectomy and has had ERCP with stent placement secondary to obstructions.  Patient was seen by his primary care physician yesterday for his generalized weakness at which time labs were drawn and those results became available today.  Patient was found to have an elevated bilirubin and as a result was instructed to come to the emergency department for further evaluation.  On arrival to the emergency department patient is in no acute distress.  He does endorse that he has had generalized weakness and mild pruritus, but is otherwise been feeling like himself.  He denies any nausea, vomiting, abdominal pain, fevers, chills, constipation, or diarrhea.  Secondary to patient's abnormal labs and his jaundiced appearance labs were repeated while in the emergency department and a CT of the abdomen and pelvis was obtained.  Patient's labs again demonstrate elevated bilirubin at 4.7.  Patient's CT of the abdomen and pelvis is concerning for intrahepatic biliary dilation as well as acute pancreatitis.  As result of these findings the specialist service was consulted for admission.  Patient will be admitted to the hospitalist service for further evaluation and care. I also spoke to the on call GI physician who plans to see the patient in the morning.  For further information regarding patient's continued hospital course please see admitting team's documentation.  The care of this patient was discussed with my attending physician Dr. Zenia Resides, who voices agreement with work-up and  ED disposition. Final Clinical Impressions(s) / ED Diagnoses   Final diagnoses:  None    ED Discharge Orders    None       Dre Gamino, Chanda Busing, MD 03/28/18 1643    Lacretia Leigh, MD 03/28/18 2322

## 2018-03-27 NOTE — ED Notes (Signed)
Patient transported to CT 

## 2018-03-28 ENCOUNTER — Encounter (HOSPITAL_COMMUNITY): Payer: Self-pay | Admitting: Family Medicine

## 2018-03-28 DIAGNOSIS — Y848 Other medical procedures as the cause of abnormal reaction of the patient, or of later complication, without mention of misadventure at the time of the procedure: Secondary | ICD-10-CM | POA: Diagnosis present

## 2018-03-28 DIAGNOSIS — K219 Gastro-esophageal reflux disease without esophagitis: Secondary | ICD-10-CM | POA: Diagnosis present

## 2018-03-28 DIAGNOSIS — Z86006 Personal history of melanoma in-situ: Secondary | ICD-10-CM | POA: Diagnosis not present

## 2018-03-28 DIAGNOSIS — E876 Hypokalemia: Secondary | ICD-10-CM | POA: Diagnosis present

## 2018-03-28 DIAGNOSIS — D649 Anemia, unspecified: Secondary | ICD-10-CM | POA: Diagnosis not present

## 2018-03-28 DIAGNOSIS — K859 Acute pancreatitis without necrosis or infection, unspecified: Secondary | ICD-10-CM | POA: Diagnosis present

## 2018-03-28 DIAGNOSIS — K831 Obstruction of bile duct: Secondary | ICD-10-CM | POA: Diagnosis present

## 2018-03-28 DIAGNOSIS — T85520A Displacement of bile duct prosthesis, initial encounter: Secondary | ICD-10-CM | POA: Diagnosis present

## 2018-03-28 DIAGNOSIS — N401 Enlarged prostate with lower urinary tract symptoms: Secondary | ICD-10-CM | POA: Diagnosis present

## 2018-03-28 DIAGNOSIS — D509 Iron deficiency anemia, unspecified: Secondary | ICD-10-CM | POA: Diagnosis present

## 2018-03-28 DIAGNOSIS — I10 Essential (primary) hypertension: Secondary | ICD-10-CM | POA: Diagnosis present

## 2018-03-28 DIAGNOSIS — Z8619 Personal history of other infectious and parasitic diseases: Secondary | ICD-10-CM | POA: Diagnosis present

## 2018-03-28 DIAGNOSIS — R748 Abnormal levels of other serum enzymes: Secondary | ICD-10-CM

## 2018-03-28 DIAGNOSIS — D539 Nutritional anemia, unspecified: Secondary | ICD-10-CM | POA: Diagnosis present

## 2018-03-28 DIAGNOSIS — R000 Tachycardia, unspecified: Secondary | ICD-10-CM | POA: Diagnosis present

## 2018-03-28 DIAGNOSIS — K8309 Other cholangitis: Secondary | ICD-10-CM | POA: Diagnosis present

## 2018-03-28 DIAGNOSIS — E871 Hypo-osmolality and hyponatremia: Secondary | ICD-10-CM | POA: Diagnosis present

## 2018-03-28 LAB — CBC WITH DIFFERENTIAL/PLATELET
Abs Immature Granulocytes: 0.05 10*3/uL (ref 0.00–0.07)
BASOS ABS: 0 10*3/uL (ref 0.0–0.1)
BASOS PCT: 1 %
EOS PCT: 3 %
Eosinophils Absolute: 0.2 10*3/uL (ref 0.0–0.5)
HCT: 29 % — ABNORMAL LOW (ref 39.0–52.0)
Hemoglobin: 9 g/dL — ABNORMAL LOW (ref 13.0–17.0)
Immature Granulocytes: 1 %
Lymphocytes Relative: 11 %
Lymphs Abs: 0.9 10*3/uL (ref 0.7–4.0)
MCH: 30.9 pg (ref 26.0–34.0)
MCHC: 31 g/dL (ref 30.0–36.0)
MCV: 99.7 fL (ref 80.0–100.0)
MONO ABS: 1 10*3/uL (ref 0.1–1.0)
Monocytes Relative: 13 %
NEUTROS ABS: 5.7 10*3/uL (ref 1.7–7.7)
NRBC: 0 % (ref 0.0–0.2)
Neutrophils Relative %: 71 %
PLATELETS: 165 10*3/uL (ref 150–400)
RBC: 2.91 MIL/uL — AB (ref 4.22–5.81)
RDW: 15.9 % — ABNORMAL HIGH (ref 11.5–15.5)
WBC: 7.9 10*3/uL (ref 4.0–10.5)

## 2018-03-28 LAB — GLUCOSE, CAPILLARY: Glucose-Capillary: 81 mg/dL (ref 70–99)

## 2018-03-28 LAB — COMPREHENSIVE METABOLIC PANEL
ALT: 115 U/L — ABNORMAL HIGH (ref 0–44)
AST: 105 U/L — AB (ref 15–41)
Albumin: 2.5 g/dL — ABNORMAL LOW (ref 3.5–5.0)
Alkaline Phosphatase: 489 U/L — ABNORMAL HIGH (ref 38–126)
Anion gap: 6 (ref 5–15)
BILIRUBIN TOTAL: 5.4 mg/dL — AB (ref 0.3–1.2)
BUN: 18 mg/dL (ref 8–23)
CHLORIDE: 108 mmol/L (ref 98–111)
CO2: 21 mmol/L — ABNORMAL LOW (ref 22–32)
CREATININE: 1 mg/dL (ref 0.61–1.24)
Calcium: 8.6 mg/dL — ABNORMAL LOW (ref 8.9–10.3)
Glucose, Bld: 84 mg/dL (ref 70–99)
POTASSIUM: 4.4 mmol/L (ref 3.5–5.1)
Sodium: 135 mmol/L (ref 135–145)
TOTAL PROTEIN: 5.2 g/dL — AB (ref 6.5–8.1)

## 2018-03-28 LAB — LIPASE, BLOOD: LIPASE: 29 U/L (ref 11–51)

## 2018-03-28 MED ORDER — ACETAMINOPHEN 325 MG PO TABS
650.0000 mg | ORAL_TABLET | Freq: Four times a day (QID) | ORAL | Status: DC | PRN
  Administered 2018-03-28 – 2018-03-31 (×3): 650 mg via ORAL
  Filled 2018-03-28 (×3): qty 2

## 2018-03-28 MED ORDER — FAMOTIDINE IN NACL 20-0.9 MG/50ML-% IV SOLN
20.0000 mg | Freq: Two times a day (BID) | INTRAVENOUS | Status: DC
  Administered 2018-03-28 – 2018-03-30 (×6): 20 mg via INTRAVENOUS
  Filled 2018-03-28 (×8): qty 50

## 2018-03-28 MED ORDER — SODIUM CHLORIDE 0.9 % IV BOLUS
500.0000 mL | Freq: Once | INTRAVENOUS | Status: AC
  Administered 2018-03-28: 500 mL via INTRAVENOUS

## 2018-03-28 MED ORDER — SODIUM CHLORIDE 0.9 % IV SOLN
INTRAVENOUS | Status: DC
  Administered 2018-03-28 (×2): via INTRAVENOUS

## 2018-03-28 MED ORDER — ONDANSETRON HCL 4 MG PO TABS: 4.0000 mg | ORAL_TABLET | Freq: Four times a day (QID) | ORAL | Status: DC | PRN

## 2018-03-28 MED ORDER — ACETAMINOPHEN 650 MG RE SUPP: 650.0000 mg | Freq: Four times a day (QID) | RECTAL | Status: DC | PRN

## 2018-03-28 MED ORDER — DEXTROSE-NACL 5-0.45 % IV SOLN
INTRAVENOUS | Status: DC
  Administered 2018-03-28 – 2018-04-01 (×5): via INTRAVENOUS

## 2018-03-28 MED ORDER — ONDANSETRON HCL 4 MG/2ML IJ SOLN
4.0000 mg | Freq: Four times a day (QID) | INTRAMUSCULAR | Status: DC | PRN
  Administered 2018-03-29 – 2018-03-31 (×2): 4 mg via INTRAVENOUS
  Filled 2018-03-28 (×2): qty 2

## 2018-03-28 MED ORDER — METOPROLOL TARTRATE 25 MG PO TABS
25.0000 mg | ORAL_TABLET | Freq: Two times a day (BID) | ORAL | Status: DC
  Administered 2018-03-28 – 2018-04-03 (×12): 25 mg via ORAL
  Filled 2018-03-28 (×12): qty 1

## 2018-03-28 MED ORDER — LABETALOL HCL 5 MG/ML IV SOLN
5.0000 mg | INTRAVENOUS | Status: DC | PRN
  Filled 2018-03-28: qty 4

## 2018-03-28 NOTE — Progress Notes (Signed)
Patient seen and evaluated, chart reviewed, please see EMR for updated orders. Please see full H&P dictated by admitting physician Dr Myna Hidalgo for same date of service.    Brief summary:- 84 y.o. male since today with complaints of epigastric discomfort and abnormal lab showing elevated liver enzymes, status post recent lap chole with ERCP that identified a stricture, status post biliary stenting on 03/12/2018, discharged on Levaquin on 03/17/2018.  Readmitted on 03/28/2018 with persistent abdominal pain and elevated LFTs with a T bili of 4.7, with CT abdomen and clinical exam consistent with acute cryptitis   1)Abd Pain--- discussed with Dr. Therisa Doyne from [place] is for ERCP on 03/29/2018 due to concerns about a typical diabetes in the setting of persistent obstructive jaundice in the patient with status post recent ERCP and biliary stent placement  2)HTN--- restart metoprolol,   may use IV labetalol when necessary  Every 4 hours for systolic blood pressure over 160 mmhg  3) recent ESBL bacteremia, patient was treated with meropenem as inpatient at [facility] and discharged home on Levaquin which he completed on 03/26/2017, repeat blood cultures pending  4)FEN--- clear liquid diet for now, dextrose half-normal saline solution at 75 mm an hour, n.p.o. after midnight for ERCP on 03/29/2018  Plan of care discussed with patient and patient's daughter at bedside  Patient seen and evaluated, chart reviewed, please see EMR for updated orders. Please see full H&P dictated by admitting physician Dr Myna Hidalgo for same date of service

## 2018-03-28 NOTE — Plan of Care (Signed)
  Problem: Education: Goal: Knowledge of General Education information will improve Description Including pain rating scale, medication(s)/side effects and non-pharmacologic comfort measures Outcome: Progressing   Problem: Health Behavior/Discharge Planning: Goal: Ability to manage health-related needs will improve Outcome: Progressing   Problem: Clinical Measurements: Goal: Ability to maintain clinical measurements within normal limits will improve Outcome: Progressing Goal: Will remain free from infection Outcome: Progressing Goal: Diagnostic test results will improve Outcome: Progressing Goal: Respiratory complications will improve Outcome: Progressing Goal: Cardiovascular complication will be avoided Outcome: Progressing   Problem: Activity: Goal: Risk for activity intolerance will decrease Outcome: Progressing   Problem: Nutrition: Goal: Adequate nutrition will be maintained Outcome: Progressing   Problem: Coping: Goal: Level of anxiety will decrease Outcome: Progressing   Problem: Elimination: Goal: Will not experience complications related to bowel motility Outcome: Progressing Goal: Will not experience complications related to urinary retention Outcome: Progressing   Problem: Pain Managment: Goal: General experience of comfort will improve Outcome: Progressing   Problem: Safety: Goal: Ability to remain free from injury will improve Outcome: Progressing   Problem: Skin Integrity: Goal: Risk for impaired skin integrity will decrease Outcome: Progressing   Forestine Chute, RN 03/28/2018

## 2018-03-28 NOTE — Consult Note (Signed)
[facility] Consult  Referring Provider: Lacretia Leigh, MD/ER Primary Care Physician:  [name], MD Primary Gastroenterologist: Althia Forts  Reason for Consultation:  Abnormal LFTs  HPI: Jose [name] is a 84 y.o. male since today with complaints of epigastric discomfort and abnormal lab showing elevated liver enzymes. Patient recently had a laparoscopic cholecystectomy( after being managed with a cholecystostomy tube drainage for several weeks) on 01/26/18 and subsequently had an ERCP by Dr.Wohl on 03/13/18 for ascending cholangitis and bacteremia,which showed a distal CBD stricture and a 10 French 9 cm plastic stent was deployed after sphincterotomy. Patient has been feeling extremely weak and complains of indigestion and epigastric discomfort. Admission was found to have a TB bili of 5.4/AST 105/ALT 115/ALP 489,leukocytosis and CAT scan showed diffuse dilatation of intrahepatic biliary ducts and common bile duct as previously noted with a common bile duct stent seen in expected position along with diffuse dilatation of the pancreatic duct. Diffuse soft tissue inflammation about pancreas compatible with pancreatitis with no pseudocyst formation. Lipase was normal at 29 on admission.  Past Medical History:  Diagnosis Date  . Anxiety   . Back problem 1960  . BPH (benign prostatic hypertrophy)   . Cervical radiculopathy    with neuropathy  . Dyspnea   . GERD (gastroesophageal reflux disease)   . Hypertension   . Hypokalemia   . Melanoma in situ ([place]) 12/2016   neck    Past Surgical History:  Procedure Laterality Date  . CATARACT EXTRACTION     OD  . CHOLECYSTECTOMY N/A 01/26/2018   Procedure: LAPAROSCOPIC CHOLECYSTECTOMY;  Surgeon: Coralie Keens, MD;  Location: [facility];  Service: General;  Laterality: N/A;  . ENDOSCOPIC RETROGRADE CHOLANGIOPANCREATOGRAPHY (ERCP) WITH PROPOFOL N/A 03/13/2018   Procedure: ENDOSCOPIC RETROGRADE CHOLANGIOPANCREATOGRAPHY (ERCP)  WITH PROPOFOL;  Surgeon: Lucilla Lame, MD;  Location: [facility];  Service: Endoscopy;  Laterality: N/A;  . IR PERC CHOLECYSTOSTOMY  12/28/2017  . LAPAROSCOPIC CHOLECYSTECTOMY  01/26/2018  . MELANOMA EXCISION  12/2016   in situ---Dr Dasher  . TONSILLECTOMY      Prior to Admission medications   Medication Sig Start Date End Date Taking? Authorizing Provider  acetaminophen (TYLENOL) 500 MG tablet Take 1,000 mg by mouth 2 (two) times daily.    Yes [provider]  feeding supplement, ENSURE ENLIVE, (ENSURE ENLIVE) LIQD Take 6 mLs by mouth 2 (two) times daily between meals. 03/17/18  Yes Wieting, Richard, MD  ferrous sulfate 325 (65 FE) MG EC tablet Take 325 mg by mouth daily with breakfast.   Yes [provider]  gabapentin (NEURONTIN) 600 MG tablet TAKE 1 TABLET (600 MG TOTAL) BY MOUTH 2 (TWO) TIMES DAILY. Patient taking differently: Take 600 mg by mouth 2 (two) times daily.  07/21/17  Yes [name], MD  ketoconazole (NIZORAL) 2 % cream Apply 1 application topically 2 (two) times daily as needed for irritation. 05/07/17  Yes [name], MD  metoprolol succinate (TOPROL-XL) 25 MG 24 hr tablet Take 1 tablet (25 mg total) by mouth daily. 03/17/18  Yes Wieting, Richard, MD  Multiple Vitamin (MULTIVITAMIN WITH MINERALS) TABS tablet Take 1 tablet by mouth daily. 03/17/18  Yes Wieting, Richard, MD  omeprazole (PRILOSEC) 20 MG capsule Take 1 capsule (20 mg total) by mouth daily. 03/26/18  Yes Viviana Simpler I, MD  tamsulosin (FLOMAX) 0.4 MG CAPS capsule TAKE 1 CAPSULE (0.4 MG TOTAL) BY MOUTH DAILY. Patient taking differently: Take 0.4 mg by mouth daily.  07/21/17  Yes [name],  MD  triamcinolone cream (KENALOG) 0.1 % Apply 1 application topically 2 (two) times daily as needed. Patient taking differently: Apply 1 application topically 2 (two) times daily as needed (irritation).  09/05/16  Yes [name], MD  vitamin B-12 (CYANOCOBALAMIN) 500 MCG tablet Take  500 mcg by mouth daily.   Yes [provider]  potassium chloride SA (K-DUR,KLOR-CON) 20 MEQ tablet Take 2 tablets (40 mEq total) by mouth daily. Patient not taking: Reported on 03/27/2018 01/06/18   [name], MD    Current Facility-Administered Medications  Medication Dose Route Frequency Provider Last Rate Last Dose  . 0.9 %  sodium chloride infusion   Intravenous Continuous Opyd, Ilene Qua, MD 100 mL/hr at 03/28/18 0759    . acetaminophen (TYLENOL) tablet 650 mg  650 mg Oral Q6H PRN Opyd, Ilene Qua, MD       Or  . acetaminophen (TYLENOL) suppository 650 mg  650 mg Rectal Q6H PRN Opyd, Ilene Qua, MD      . famotidine (PEPCID) IVPB 20 mg premix  20 mg Intravenous Q12H Opyd, Ilene Qua, MD 100 mL/hr at 03/28/18 1003 20 mg at 03/28/18 1003  . labetalol (NORMODYNE,TRANDATE) injection 5 mg  5 mg Intravenous Q2H PRN Opyd, Ilene Qua, MD      . ondansetron (ZOFRAN) tablet 4 mg  4 mg Oral Q6H PRN Opyd, Ilene Qua, MD       Or  . ondansetron (ZOFRAN) injection 4 mg  4 mg Intravenous Q6H PRN Opyd, Ilene Qua, MD        Allergies as of 03/27/2018  . (No Known Allergies)    Family History  Problem Relation Age of Onset  . Cancer Son   . Coronary artery disease Neg Hx   . Diabetes Neg Hx     Social History   Socioeconomic History  . Marital status: Widowed    Spouse name: Not on file  . Number of children: 2  . Years of education: Not on file  . Highest education level: Not on file  Occupational History  . Occupation: retiredOccupational psychologist [employer]    Employer: RETIRED  Social Needs  . Financial resource strain: Not on file  . Food insecurity:    Worry: Not on file    Inability: Not on file  . Transportation needs:    Medical: Not on file    Non-medical: Not on file  Tobacco Use  . Smoking status: Never Smoker  . Smokeless tobacco: Never Used  Substance and Sexual Activity  . Alcohol use: No  . Drug use: No  . Sexual activity: Not on file  Lifestyle  . Physical  activity:    Days per week: Not on file    Minutes per session: Not on file  . Stress: Not on file  Relationships  . Social connections:    Talks on phone: Patient refused    Gets together: Patient refused    Attends religious service: Patient refused    Active member of club or organization: Patient refused    Attends meetings of clubs or organizations: Patient refused    Relationship status: Patient refused  . Intimate partner violence:    Fear of current or ex partner: Patient refused    Emotionally abused: Patient refused    Physically abused: Patient refused    Forced sexual activity: Patient refused  Other Topics Concern  . Not on file  Social History Narrative   Has living will   Son Mikeal Hawthorne is  health care POA.     Requests DNR--done 05/07/16   No feeding tube if cognitively unaware    Review of Systems: Positive for: GI: Described in detail in HPI.    Gen: anorexia, fatigue, weakness, malaise, Denies any fever, chills, rigors, night sweats, involuntary weight loss, and sleep disorder CV: Denies chest pain, angina, palpitations, syncope, orthopnea, PND, peripheral edema, and claudication. Resp: Denies dyspnea, cough, sputum, wheezing, coughing up blood. GU : Denies urinary burning, blood in urine, urinary frequency, urinary hesitancy, nocturnal urination, and urinary incontinence. MS: Denies joint pain or swelling.  Denies muscle weakness, cramps, atrophy.  Derm: Denies rash, itching, oral ulcerations, hives, unhealing ulcers.  Psych: Denies depression, anxiety, memory loss, suicidal ideation, hallucinations,  and confusion. Heme: Denies bruising, bleeding, and enlarged lymph nodes. Neuro:  Denies any headaches, dizziness, paresthesias. Endo:  Denies any problems with DM, thyroid, adrenal function.  Physical Exam: Vital signs in last 24 hours: Temp:  [97.7 F (36.5 C)-98.4 F (36.9 C)] 98.2 F (36.8 C) (11/09 0520) Pulse Rate:  [86-120] 97 (11/09 0520) Resp:   [16-23] 16 (11/09 0520) BP: (101-171)/(55-101) 133/69 (11/09 0520) SpO2:  [94 %-100 %] 97 % (11/09 0520) Weight:  [58.2 kg] 58.2 kg (11/09 0500) Last BM Date: 03/27/18  General:   Alert,  Well-developed, frail, thinly built, pleasant and cooperative in NAD,appears his stated age Head:  Normocephalic and atraumatic. Eyes:  Sclera clear,mild icterus.   Conjunctiva pink. Ears:  Normal auditory acuity. Nose:  No deformity, discharge,  or lesions. Mouth:  No deformity or lesions.  Oropharynx pink & moist. Neck:  Supple; no masses or thyromegaly. Lungs:  Clear throughout to auscultation.   No wheezes, crackles, or rhonchi. No acute distress. Heart:  Regular rate and rhythm; no murmurs, clicks, rubs,  or gallops. Extremities:  Without clubbing or edema. Neurologic:  Alert and  oriented x4;  grossly normal neurologically. Skin:  Intact without significant lesions or rashes. Psych:  Alert and cooperative. Normal mood and affect. Abdomen:  Soft, nontender and nondistended. No masses, hepatosplenomegaly or hernias noted. Normal bowel sounds, without guarding, and without rebound.         Lab Results: Recent Labs    03/26/18 1730 03/27/18 1854 03/28/18 0425  WBC 14.5* 9.6 7.9  HGB 11.8* 10.2* 9.0*  HCT 35.1* 33.1* 29.0*  PLT 199.0 177 165   BMET Recent Labs    03/26/18 1730 03/27/18 1854 03/28/18 0425  NA 135 136 135  K 5.4* 4.7 4.4  CL 102 106 108  CO2 24 20* 21*  GLUCOSE 130* 117* 84  BUN 21 23 18   CREATININE 0.95 1.03 1.00  CALCIUM 9.6 9.2 8.6*   LFT Recent Labs    03/27/18 1854 03/28/18 0425  PROT 6.7 5.2*  ALBUMIN 3.2* 2.5*  AST 92* 105*  ALT 151* 115*  ALKPHOS 558* 489*  BILITOT 4.7* 5.4*  BILIDIR 3.4*  --    PT/INR No results for input(s): LABPROT, INR in the last 72 hours.  Studies/Results: Ct Abdomen Pelvis W Contrast  Result Date: 03/27/2018 CLINICAL DATA:  Acute onset of generalized weakness. Elevated bilirubin. EXAM: CT ABDOMEN AND PELVIS WITH  CONTRAST TECHNIQUE: Multidetector CT imaging of the abdomen and pelvis was performed using the standard protocol following bolus administration of intravenous contrast. CONTRAST:  163mL OMNIPAQUE IOHEXOL 300 MG/ML  SOLN COMPARISON:  CT of the abdomen and pelvis performed 03/11/2018, and MRCP performed 03/12/2018 FINDINGS: Lower chest: Mild bibasilar airspace opacities likely reflect atelectasis. Scattered coronary artery calcifications  are seen. Hepatobiliary: There is diffuse dilatation of the intrahepatic biliary ducts and common bile duct, as previously noted, with a common bile duct stent seen in expected position. There is also diffuse dilatation of the pancreatic duct. The liver is otherwise grossly unremarkable in appearance. The patient is status post cholecystectomy, with clips noted at the gallbladder fossa. Pancreas: Diffuse soft tissue inflammation about the pancreas is compatible with pancreatitis, as previously noted, with slightly decreased enhancement noted at the uncinate process. No definite pseudocyst formation is noted. Spleen: The spleen is unremarkable in appearance. Adrenals/Urinary Tract: The adrenal glands are unremarkable in appearance. Small left renal cysts are noted. There is no evidence of hydronephrosis. No renal or ureteral stones are identified. No perinephric stranding is seen. Stomach/Bowel: The stomach is unremarkable in appearance. The small bowel is within normal limits. The appendix is normal in caliber, without evidence of appendicitis. The colon is unremarkable in appearance. Vascular/Lymphatic: Scattered calcification is seen along the abdominal aorta and its branches. The abdominal aorta is otherwise grossly unremarkable. The inferior vena cava is grossly unremarkable. No retroperitoneal lymphadenopathy is seen. No pelvic sidewall lymphadenopathy is identified. Reproductive: The bladder is mildly distended and grossly unremarkable. The prostate is borderline normal in size.  Other: No additional soft tissue abnormalities are seen. Musculoskeletal: No acute osseous abnormalities are identified. Multilevel vacuum phenomenon is noted along the lower thoracic and lumbar spine, with mild underlying facet disease. The visualized musculature is unremarkable in appearance. IMPRESSION: 1. Diffuse soft tissue inflammation about the pancreas is compatible with acute pancreatitis, as previously noted, with slightly decreased enhancement noted at the uncinate process. No definite pseudocyst formation seen. 2. Diffuse dilatation of the intrahepatic biliary ducts and common bile duct, as previously noted, with a common bile duct stent seen in expected position. Diffuse dilatation of the pancreatic duct. 3. Mild bibasilar airspace opacities likely reflect atelectasis. 4. Scattered coronary artery calcifications seen. 5. Small left renal cysts noted. 6. Mild degenerative change along the lower thoracic and lumbar spine. Aortic Atherosclerosis (ICD10-I70.0). Electronically Signed   By: Garald Balding M.D.   On: 03/27/2018 23:24    Impression: History of ascending cholangitis and distal stricture with sphincterotomy and plastic stent placement on 03/13/18,since with worsening LFTs, could be related to stent malfunction Pancreatitis  Plan: Clear liquid diet and nothing by mouth past midnight for ERCP to be performed in a.m.Marland Kitchen Depending upon cholangiogram may need new stent placement if the distal CBD obstruction/stricture is persistent  The risks(infection, bleeding, perforation, pancreatitis, even death) and benefits of the procedure were discussed with the patient in details with the patient, his son and daughter-in-law at bedside. Understand and verbalized consent   Currently on normal saline at 100 mL per hour pancreatitis Not in pain  LOS: 0 days   Ronnette Juniper, M.D.  03/28/2018, 10:14 AM  Pager [phone] If no answer or after 5 PM call [phone]

## 2018-03-28 NOTE — H&P (Signed)
History and Physical    Jose [name]:[id number] DOB: 28-Feb-1924 DOA: 03/27/2018  PCP: [name], MD   Patient coming from: Home   Chief Complaint: Gen weakness, malaise, epigastric discomfort, indigestion, loss of appetite   HPI: Jose [name] is a 84 y.o. male with medical history significant for hypertension, chronic anemia, and GERD, now presenting to the emergency department for evaluation of generalized weakness, malaise, epigastric discomfort, and loss of appetite.  Patient was admitted to an outside hospital last month with abdominal discomfort, was found to have obstructive jaundice and pancreatitis with cholangitis, blood cultures were positive for ESBL, he was started on meropenem and underwent laparoscopic cholecystectomy and ERCP with a stricture identified.  Biliary stent was placed on 03/12/2018.  He was discharged on 03/17/2018 with Levaquin and instructions to follow-up with GI for stent removal in a month.  Since returning home, he has completed the course of Levaquin, denies any fevers, but reports worsening generalized weakness with burning discomfort in the epigastrium and lower chest, and loss of appetite.  Denies nausea, vomiting, diarrhea, melena, or hematochezia.  Denies shortness of breath or cough.  ED Course: Upon arrival to the ED, patient is found to be afebrile, saturating well on room air, tachycardic to 120, and with initial blood pressure 101/55.  Chemistry panel is notable for alkaline phosphatase 558, AST 92, ALT 151, and total bilirubin 4.7.  CBC is notable for microcytic anemia with hemoglobin 10.2.  CT of the abdomen and pelvis is notable for diffuse soft tissue inflammation about the pancreas consistent with pancreatitis, no definite pseudocyst, dilation of the intrahepatic ducts, CBD, and pancreatic duct, and biliary stent in the expected position.  Gastroenterology was consulted by the ED physician and recommended medical  admission.  Review of Systems:  All other systems reviewed and apart from HPI, are negative.  Past Medical History:  Diagnosis Date  . Anxiety   . Back problem 1960  . BPH (benign prostatic hypertrophy)   . Cervical radiculopathy    with neuropathy  . Dyspnea   . GERD (gastroesophageal reflux disease)   . Hypertension   . Hypokalemia   . Melanoma in situ ([place]) 12/2016   neck    Past Surgical History:  Procedure Laterality Date  . CATARACT EXTRACTION     OD  . CHOLECYSTECTOMY N/A 01/26/2018   Procedure: LAPAROSCOPIC CHOLECYSTECTOMY;  Surgeon: Coralie Keens, MD;  Location: [place];  Service: General;  Laterality: N/A;  . ENDOSCOPIC RETROGRADE CHOLANGIOPANCREATOGRAPHY (ERCP) WITH PROPOFOL N/A 03/13/2018   Procedure: ENDOSCOPIC RETROGRADE CHOLANGIOPANCREATOGRAPHY (ERCP) WITH PROPOFOL;  Surgeon: Lucilla Lame, MD;  Location: [facility];  Service: Endoscopy;  Laterality: N/A;  . IR PERC CHOLECYSTOSTOMY  12/28/2017  . LAPAROSCOPIC CHOLECYSTECTOMY  01/26/2018  . MELANOMA EXCISION  12/2016   in situ---Dr Dasher  . TONSILLECTOMY       reports that he has never smoked. He has never used smokeless tobacco. He reports that he does not drink alcohol or use drugs.  No Known Allergies  Family History  Problem Relation Age of Onset  . Cancer Son   . Coronary artery disease Neg Hx   . Diabetes Neg Hx      Prior to Admission medications   Medication Sig Start Date End Date Taking? Authorizing Provider  acetaminophen (TYLENOL) 500 MG tablet Take 1,000 mg by mouth 2 (two) times daily.    Yes [provider]  feeding supplement, ENSURE ENLIVE, (ENSURE ENLIVE) LIQD Take 6 mLs by mouth 2 (  two) times daily between meals. 03/17/18  Yes Wieting, Richard, MD  ferrous sulfate 325 (65 FE) MG EC tablet Take 325 mg by mouth daily with breakfast.   Yes [provider]  gabapentin (NEURONTIN) 600 MG tablet TAKE 1 TABLET (600 MG TOTAL) BY MOUTH 2 (TWO) TIMES DAILY. Patient  taking differently: Take 600 mg by mouth 2 (two) times daily.  07/21/17  Yes [name], MD  ketoconazole (NIZORAL) 2 % cream Apply 1 application topically 2 (two) times daily as needed for irritation. 05/07/17  Yes [name], MD  metoprolol succinate (TOPROL-XL) 25 MG 24 hr tablet Take 1 tablet (25 mg total) by mouth daily. 03/17/18  Yes Wieting, Richard, MD  Multiple Vitamin (MULTIVITAMIN WITH MINERALS) TABS tablet Take 1 tablet by mouth daily. 03/17/18  Yes Wieting, Richard, MD  omeprazole (PRILOSEC) 20 MG capsule Take 1 capsule (20 mg total) by mouth daily. 03/26/18  Yes Viviana Simpler I, MD  tamsulosin (FLOMAX) 0.4 MG CAPS capsule TAKE 1 CAPSULE (0.4 MG TOTAL) BY MOUTH DAILY. Patient taking differently: Take 0.4 mg by mouth daily.  07/21/17  Yes Viviana Simpler I, MD  triamcinolone cream (KENALOG) 0.1 % Apply 1 application topically 2 (two) times daily as needed. Patient taking differently: Apply 1 application topically 2 (two) times daily as needed (irritation).  09/05/16  Yes [name], MD  vitamin B-12 (CYANOCOBALAMIN) 500 MCG tablet Take 500 mcg by mouth daily.   Yes [provider]  potassium chloride SA (K-DUR,KLOR-CON) 20 MEQ tablet Take 2 tablets (40 mEq total) by mouth daily. Patient not taking: Reported on 03/27/2018 01/06/18   [name], MD    Physical Exam: Vitals:   03/27/18 2230 03/27/18 2315 03/28/18 0000 03/28/18 0030  BP: (!) 101/55 110/69 118/66 103/82  Pulse: (!) 109 (!) 106 (!) 104 (!) 111  Resp: (!) 23  (!) 22 20  Temp:      TempSrc:      SpO2: 96% 96% 96% 94%    Constitutional: NAD, calm  Eyes: PERTLA, lids and conjunctivae normal ENMT: Mucous membranes are moist. Posterior pharynx clear of any exudate or lesions.   Neck: normal, supple, no masses, no thyromegaly Respiratory: clear to auscultation bilaterally, no wheezing, no crackles. Normal respiratory effort.    Cardiovascular: Rate ~110 and regular. No extremity edema.    Abdomen: Soft, mild tenderness in epigastrium, no rebound pain or guarding. Bowel sounds active.  Musculoskeletal: no clubbing / cyanosis. No joint deformity upper and lower extremities.    Skin: no significant rashes, lesions, ulcers. Warm, dry, well-perfused. Neurologic: No facial asymmetry. Gross hearing deficit. Sensation intact. Moving all extremities.  Psychiatric: Alert and oriented to person, place, and situation. Pleasant and cooperative.    Labs on Admission: I have personally reviewed following labs and imaging studies  CBC: Recent Labs  Lab 03/26/18 1730 03/27/18 1854  WBC 14.5* 9.6  NEUTROABS  --  7.5  HGB 11.8* 10.2*  HCT 35.1* 33.1*  MCV 99.1 104.1*  PLT 199.0 485   Basic Metabolic Panel: Recent Labs  Lab 03/26/18 1730 03/27/18 1854  NA 135 136  K 5.4* 4.7  CL 102 106  CO2 24 20*  GLUCOSE 130* 117*  BUN 21 23  CREATININE 0.95 1.03  CALCIUM 9.6 9.2  MG  --  2.0   GFR: Estimated Creatinine Clearance: 37.3 mL/min (by C-G formula based on SCr of 1.03 mg/dL). Liver Function Tests: Recent Labs  Lab 03/26/18 1730 03/27/18 1854  AST  197* 92*  ALT 199* 151*  ALKPHOS 691* 558*  BILITOT 4.7* 4.7*  PROT 6.9 6.7  ALBUMIN 3.7 3.2*   No results for input(s): LIPASE, AMYLASE in the last 168 hours. Recent Labs  Lab 03/27/18 1854  AMMONIA 37*   Coagulation Profile: No results for input(s): INR, PROTIME in the last 168 hours. Cardiac Enzymes: No results for input(s): CKTOTAL, CKMB, CKMBINDEX, TROPONINI in the last 168 hours. BNP (last 3 results) No results for input(s): PROBNP in the last 8760 hours. HbA1C: No results for input(s): HGBA1C in the last 72 hours. CBG: No results for input(s): GLUCAP in the last 168 hours. Lipid Profile: No results for input(s): CHOL, HDL, LDLCALC, TRIG, CHOLHDL, LDLDIRECT in the last 72 hours. Thyroid Function Tests: No results for input(s): TSH, T4TOTAL, FREET4, T3FREE, THYROIDAB in the last 72 hours. Anemia  Panel: No results for input(s): VITAMINB12, FOLATE, FERRITIN, TIBC, IRON, RETICCTPCT in the last 72 hours. Urine analysis:    Component Value Date/Time   COLORURINE AMBER (A) 03/27/2018 2152   APPEARANCEUR CLEAR 03/27/2018 2152   LABSPEC 1.018 03/27/2018 2152   PHURINE 6.0 03/27/2018 2152   GLUCOSEU NEGATIVE 03/27/2018 2152   HGBUR NEGATIVE 03/27/2018 2152   BILIRUBINUR NEGATIVE 03/27/2018 2152   [place] NEGATIVE 03/27/2018 2152   PROTEINUR NEGATIVE 03/27/2018 2152   NITRITE NEGATIVE 03/27/2018 2152   LEUKOCYTESUR NEGATIVE 03/27/2018 2152   Sepsis Labs: @LABRCNTIP (procalcitonin:4,lacticidven:4) )No results found for this or any previous visit (from the past 240 hour(s)).   Radiological Exams on Admission: Ct Abdomen Pelvis W Contrast  Result Date: 03/27/2018 CLINICAL DATA:  Acute onset of generalized weakness. Elevated bilirubin. EXAM: CT ABDOMEN AND PELVIS WITH CONTRAST TECHNIQUE: Multidetector CT imaging of the abdomen and pelvis was performed using the standard protocol following bolus administration of intravenous contrast. CONTRAST:  163mL OMNIPAQUE IOHEXOL 300 MG/ML  SOLN COMPARISON:  CT of the abdomen and pelvis performed 03/11/2018, and MRCP performed 03/12/2018 FINDINGS: Lower chest: Mild bibasilar airspace opacities likely reflect atelectasis. Scattered coronary artery calcifications are seen. Hepatobiliary: There is diffuse dilatation of the intrahepatic biliary ducts and common bile duct, as previously noted, with a common bile duct stent seen in expected position. There is also diffuse dilatation of the pancreatic duct. The liver is otherwise grossly unremarkable in appearance. The patient is status post cholecystectomy, with clips noted at the gallbladder fossa. Pancreas: Diffuse soft tissue inflammation about the pancreas is compatible with pancreatitis, as previously noted, with slightly decreased enhancement noted at the uncinate process. No definite pseudocyst formation is  noted. Spleen: The spleen is unremarkable in appearance. Adrenals/Urinary Tract: The adrenal glands are unremarkable in appearance. Small left renal cysts are noted. There is no evidence of hydronephrosis. No renal or ureteral stones are identified. No perinephric stranding is seen. Stomach/Bowel: The stomach is unremarkable in appearance. The small bowel is within normal limits. The appendix is normal in caliber, without evidence of appendicitis. The colon is unremarkable in appearance. Vascular/Lymphatic: Scattered calcification is seen along the abdominal aorta and its branches. The abdominal aorta is otherwise grossly unremarkable. The inferior vena cava is grossly unremarkable. No retroperitoneal lymphadenopathy is seen. No pelvic sidewall lymphadenopathy is identified. Reproductive: The bladder is mildly distended and grossly unremarkable. The prostate is borderline normal in size. Other: No additional soft tissue abnormalities are seen. Musculoskeletal: No acute osseous abnormalities are identified. Multilevel vacuum phenomenon is noted along the lower thoracic and lumbar spine, with mild underlying facet disease. The visualized musculature is unremarkable in appearance. IMPRESSION: 1.  Diffuse soft tissue inflammation about the pancreas is compatible with acute pancreatitis, as previously noted, with slightly decreased enhancement noted at the uncinate process. No definite pseudocyst formation seen. 2. Diffuse dilatation of the intrahepatic biliary ducts and common bile duct, as previously noted, with a common bile duct stent seen in expected position. Diffuse dilatation of the pancreatic duct. 3. Mild bibasilar airspace opacities likely reflect atelectasis. 4. Scattered coronary artery calcifications seen. 5. Small left renal cysts noted. 6. Mild degenerative change along the lower thoracic and lumbar spine. Aortic Atherosclerosis (ICD10-I70.0). Electronically Signed   By: Garald Balding M.D.   On:  03/27/2018 23:24    EKG: Not performed.   Assessment/Plan   1. Obstructive jaundice, pancreatitis  - Presents with generalized weakness, malaise, epigastric discomfort, and poor appetite after admission last month with obstructive jaundice attributed to CBD stricture, complicated by cholangitis and ESBL bacteremia; total bili had normalized by time of discharge on 10/29 - He is found to have elevated LFT's, total bilirubin 4.7, and CT abd/pelvis with dilation of intrahepatic biliary ducts, CBD, and pancreatic duct, CBD stent in expected position  - GI is consulting and much appreciated  - Continue bowel-rest, IVF hydration, Pepcid, as-needed analgesia and antiemetics    2. Macrocytic anemia  - Hgb is 10.2 on admission, up from 8-9 during recent hospitalization  - Not bleeding, likely secondary to chronic disease   3. Hypertension  - BP at goal in ED  - Toprol held on admission  - Use labetalol IVP's as-needed while NPO    4. Recent ESBL bacteremia  - Treated with meropenem at outside hospital late last month and was discharged with Levaquin which he finished 11/7 or 03/27/18 - He is afebrile here, denies fever at home, and WBC has normalized  - Blood cultures are being repeated    DVT prophylaxis: SCD's   Code status: Full; DNR per PCP notes, but son is POA and wants Full Code  Family Communication: Son and daughter-in-law updated at bedside Consults called: GI consulted by ED physician  Admission status: Inpatient     [name], MD [facility] Pager [phone]  If 7PM-7AM, please contact night-coverage [email or web address] Password [email or web address]  03/28/2018, 12:48 AM

## 2018-03-28 NOTE — H&P (View-Only) (Signed)
[facility] Consult  Referring Provider: Lacretia Leigh, MD/ER Primary Care Physician:  [name], MD Primary Gastroenterologist: Althia Forts  Reason for Consultation:  Abnormal LFTs  HPI: Jose [name] is a 84 y.o. male since today with complaints of epigastric discomfort and abnormal lab showing elevated liver enzymes. Patient recently had a laparoscopic cholecystectomy( after being managed with a cholecystostomy tube drainage for several weeks) on 01/26/18 and subsequently had an ERCP by Dr.Wohl on 03/13/18 for ascending cholangitis and bacteremia,which showed a distal CBD stricture and a 10 French 9 cm plastic stent was deployed after sphincterotomy. Patient has been feeling extremely weak and complains of indigestion and epigastric discomfort. Admission was found to have a TB bili of 5.4/AST 105/ALT 115/ALP 489,leukocytosis and CAT scan showed diffuse dilatation of intrahepatic biliary ducts and common bile duct as previously noted with a common bile duct stent seen in expected position along with diffuse dilatation of the pancreatic duct. Diffuse soft tissue inflammation about pancreas compatible with pancreatitis with no pseudocyst formation. Lipase was normal at 29 on admission.  Past Medical History:  Diagnosis Date  . Anxiety   . Back problem 1960  . BPH (benign prostatic hypertrophy)   . Cervical radiculopathy    with neuropathy  . Dyspnea   . GERD (gastroesophageal reflux disease)   . Hypertension   . Hypokalemia   . Melanoma in situ ([place]) 12/2016   neck    Past Surgical History:  Procedure Laterality Date  . CATARACT EXTRACTION     OD  . CHOLECYSTECTOMY N/A 01/26/2018   Procedure: LAPAROSCOPIC CHOLECYSTECTOMY;  Surgeon: Coralie Keens, MD;  Location: [facility];  Service: General;  Laterality: N/A;  . ENDOSCOPIC RETROGRADE CHOLANGIOPANCREATOGRAPHY (ERCP) WITH PROPOFOL N/A 03/13/2018   Procedure: ENDOSCOPIC RETROGRADE CHOLANGIOPANCREATOGRAPHY (ERCP)  WITH PROPOFOL;  Surgeon: Lucilla Lame, MD;  Location: [facility];  Service: Endoscopy;  Laterality: N/A;  . IR PERC CHOLECYSTOSTOMY  12/28/2017  . LAPAROSCOPIC CHOLECYSTECTOMY  01/26/2018  . MELANOMA EXCISION  12/2016   in situ---Dr Dasher  . TONSILLECTOMY      Prior to Admission medications   Medication Sig Start Date End Date Taking? Authorizing Provider  acetaminophen (TYLENOL) 500 MG tablet Take 1,000 mg by mouth 2 (two) times daily.    Yes [provider]  feeding supplement, ENSURE ENLIVE, (ENSURE ENLIVE) LIQD Take 6 mLs by mouth 2 (two) times daily between meals. 03/17/18  Yes Wieting, Richard, MD  ferrous sulfate 325 (65 FE) MG EC tablet Take 325 mg by mouth daily with breakfast.   Yes [provider]  gabapentin (NEURONTIN) 600 MG tablet TAKE 1 TABLET (600 MG TOTAL) BY MOUTH 2 (TWO) TIMES DAILY. Patient taking differently: Take 600 mg by mouth 2 (two) times daily.  07/21/17  Yes [name], MD  ketoconazole (NIZORAL) 2 % cream Apply 1 application topically 2 (two) times daily as needed for irritation. 05/07/17  Yes [name], MD  metoprolol succinate (TOPROL-XL) 25 MG 24 hr tablet Take 1 tablet (25 mg total) by mouth daily. 03/17/18  Yes Wieting, Richard, MD  Multiple Vitamin (MULTIVITAMIN WITH MINERALS) TABS tablet Take 1 tablet by mouth daily. 03/17/18  Yes Wieting, Richard, MD  omeprazole (PRILOSEC) 20 MG capsule Take 1 capsule (20 mg total) by mouth daily. 03/26/18  Yes Viviana Simpler I, MD  tamsulosin (FLOMAX) 0.4 MG CAPS capsule TAKE 1 CAPSULE (0.4 MG TOTAL) BY MOUTH DAILY. Patient taking differently: Take 0.4 mg by mouth daily.  07/21/17  Yes [name],  MD  triamcinolone cream (KENALOG) 0.1 % Apply 1 application topically 2 (two) times daily as needed. Patient taking differently: Apply 1 application topically 2 (two) times daily as needed (irritation).  09/05/16  Yes [name], MD  vitamin B-12 (CYANOCOBALAMIN) 500 MCG tablet Take  500 mcg by mouth daily.   Yes [provider]  potassium chloride SA (K-DUR,KLOR-CON) 20 MEQ tablet Take 2 tablets (40 mEq total) by mouth daily. Patient not taking: Reported on 03/27/2018 01/06/18   [name], MD    Current Facility-Administered Medications  Medication Dose Route Frequency Provider Last Rate Last Dose  . 0.9 %  sodium chloride infusion   Intravenous Continuous Opyd, Ilene Qua, MD 100 mL/hr at 03/28/18 0759    . acetaminophen (TYLENOL) tablet 650 mg  650 mg Oral Q6H PRN Opyd, Ilene Qua, MD       Or  . acetaminophen (TYLENOL) suppository 650 mg  650 mg Rectal Q6H PRN Opyd, Ilene Qua, MD      . famotidine (PEPCID) IVPB 20 mg premix  20 mg Intravenous Q12H Opyd, Ilene Qua, MD 100 mL/hr at 03/28/18 1003 20 mg at 03/28/18 1003  . labetalol (NORMODYNE,TRANDATE) injection 5 mg  5 mg Intravenous Q2H PRN Opyd, Ilene Qua, MD      . ondansetron (ZOFRAN) tablet 4 mg  4 mg Oral Q6H PRN Opyd, Ilene Qua, MD       Or  . ondansetron (ZOFRAN) injection 4 mg  4 mg Intravenous Q6H PRN Opyd, Ilene Qua, MD        Allergies as of 03/27/2018  . (No Known Allergies)    Family History  Problem Relation Age of Onset  . Cancer Son   . Coronary artery disease Neg Hx   . Diabetes Neg Hx     Social History   Socioeconomic History  . Marital status: Widowed    Spouse name: Not on file  . Number of children: 2  . Years of education: Not on file  . Highest education level: Not on file  Occupational History  . Occupation: retiredOccupational psychologist [employer]    Employer: RETIRED  Social Needs  . Financial resource strain: Not on file  . Food insecurity:    Worry: Not on file    Inability: Not on file  . Transportation needs:    Medical: Not on file    Non-medical: Not on file  Tobacco Use  . Smoking status: Never Smoker  . Smokeless tobacco: Never Used  Substance and Sexual Activity  . Alcohol use: No  . Drug use: No  . Sexual activity: Not on file  Lifestyle  . Physical  activity:    Days per week: Not on file    Minutes per session: Not on file  . Stress: Not on file  Relationships  . Social connections:    Talks on phone: Patient refused    Gets together: Patient refused    Attends religious service: Patient refused    Active member of club or organization: Patient refused    Attends meetings of clubs or organizations: Patient refused    Relationship status: Patient refused  . Intimate partner violence:    Fear of current or ex partner: Patient refused    Emotionally abused: Patient refused    Physically abused: Patient refused    Forced sexual activity: Patient refused  Other Topics Concern  . Not on file  Social History Narrative   Has living will   Son Mikeal Hawthorne is  health care POA.     Requests DNR--done 05/07/16   No feeding tube if cognitively unaware    Review of Systems: Positive for: GI: Described in detail in HPI.    Gen: anorexia, fatigue, weakness, malaise, Denies any fever, chills, rigors, night sweats, involuntary weight loss, and sleep disorder CV: Denies chest pain, angina, palpitations, syncope, orthopnea, PND, peripheral edema, and claudication. Resp: Denies dyspnea, cough, sputum, wheezing, coughing up blood. GU : Denies urinary burning, blood in urine, urinary frequency, urinary hesitancy, nocturnal urination, and urinary incontinence. MS: Denies joint pain or swelling.  Denies muscle weakness, cramps, atrophy.  Derm: Denies rash, itching, oral ulcerations, hives, unhealing ulcers.  Psych: Denies depression, anxiety, memory loss, suicidal ideation, hallucinations,  and confusion. Heme: Denies bruising, bleeding, and enlarged lymph nodes. Neuro:  Denies any headaches, dizziness, paresthesias. Endo:  Denies any problems with DM, thyroid, adrenal function.  Physical Exam: Vital signs in last 24 hours: Temp:  [97.7 F (36.5 C)-98.4 F (36.9 C)] 98.2 F (36.8 C) (11/09 0520) Pulse Rate:  [86-120] 97 (11/09 0520) Resp:   [16-23] 16 (11/09 0520) BP: (101-171)/(55-101) 133/69 (11/09 0520) SpO2:  [94 %-100 %] 97 % (11/09 0520) Weight:  [58.2 kg] 58.2 kg (11/09 0500) Last BM Date: 03/27/18  General:   Alert,  Well-developed, frail, thinly built, pleasant and cooperative in NAD,appears his stated age Head:  Normocephalic and atraumatic. Eyes:  Sclera clear,mild icterus.   Conjunctiva pink. Ears:  Normal auditory acuity. Nose:  No deformity, discharge,  or lesions. Mouth:  No deformity or lesions.  Oropharynx pink & moist. Neck:  Supple; no masses or thyromegaly. Lungs:  Clear throughout to auscultation.   No wheezes, crackles, or rhonchi. No acute distress. Heart:  Regular rate and rhythm; no murmurs, clicks, rubs,  or gallops. Extremities:  Without clubbing or edema. Neurologic:  Alert and  oriented x4;  grossly normal neurologically. Skin:  Intact without significant lesions or rashes. Psych:  Alert and cooperative. Normal mood and affect. Abdomen:  Soft, nontender and nondistended. No masses, hepatosplenomegaly or hernias noted. Normal bowel sounds, without guarding, and without rebound.         Lab Results: Recent Labs    03/26/18 1730 03/27/18 1854 03/28/18 0425  WBC 14.5* 9.6 7.9  HGB 11.8* 10.2* 9.0*  HCT 35.1* 33.1* 29.0*  PLT 199.0 177 165   BMET Recent Labs    03/26/18 1730 03/27/18 1854 03/28/18 0425  NA 135 136 135  K 5.4* 4.7 4.4  CL 102 106 108  CO2 24 20* 21*  GLUCOSE 130* 117* 84  BUN 21 23 18   CREATININE 0.95 1.03 1.00  CALCIUM 9.6 9.2 8.6*   LFT Recent Labs    03/27/18 1854 03/28/18 0425  PROT 6.7 5.2*  ALBUMIN 3.2* 2.5*  AST 92* 105*  ALT 151* 115*  ALKPHOS 558* 489*  BILITOT 4.7* 5.4*  BILIDIR 3.4*  --    PT/INR No results for input(s): LABPROT, INR in the last 72 hours.  Studies/Results: Ct Abdomen Pelvis W Contrast  Result Date: 03/27/2018 CLINICAL DATA:  Acute onset of generalized weakness. Elevated bilirubin. EXAM: CT ABDOMEN AND PELVIS WITH  CONTRAST TECHNIQUE: Multidetector CT imaging of the abdomen and pelvis was performed using the standard protocol following bolus administration of intravenous contrast. CONTRAST:  128mL OMNIPAQUE IOHEXOL 300 MG/ML  SOLN COMPARISON:  CT of the abdomen and pelvis performed 03/11/2018, and MRCP performed 03/12/2018 FINDINGS: Lower chest: Mild bibasilar airspace opacities likely reflect atelectasis. Scattered coronary artery calcifications  are seen. Hepatobiliary: There is diffuse dilatation of the intrahepatic biliary ducts and common bile duct, as previously noted, with a common bile duct stent seen in expected position. There is also diffuse dilatation of the pancreatic duct. The liver is otherwise grossly unremarkable in appearance. The patient is status post cholecystectomy, with clips noted at the gallbladder fossa. Pancreas: Diffuse soft tissue inflammation about the pancreas is compatible with pancreatitis, as previously noted, with slightly decreased enhancement noted at the uncinate process. No definite pseudocyst formation is noted. Spleen: The spleen is unremarkable in appearance. Adrenals/Urinary Tract: The adrenal glands are unremarkable in appearance. Small left renal cysts are noted. There is no evidence of hydronephrosis. No renal or ureteral stones are identified. No perinephric stranding is seen. Stomach/Bowel: The stomach is unremarkable in appearance. The small bowel is within normal limits. The appendix is normal in caliber, without evidence of appendicitis. The colon is unremarkable in appearance. Vascular/Lymphatic: Scattered calcification is seen along the abdominal aorta and its branches. The abdominal aorta is otherwise grossly unremarkable. The inferior vena cava is grossly unremarkable. No retroperitoneal lymphadenopathy is seen. No pelvic sidewall lymphadenopathy is identified. Reproductive: The bladder is mildly distended and grossly unremarkable. The prostate is borderline normal in size.  Other: No additional soft tissue abnormalities are seen. Musculoskeletal: No acute osseous abnormalities are identified. Multilevel vacuum phenomenon is noted along the lower thoracic and lumbar spine, with mild underlying facet disease. The visualized musculature is unremarkable in appearance. IMPRESSION: 1. Diffuse soft tissue inflammation about the pancreas is compatible with acute pancreatitis, as previously noted, with slightly decreased enhancement noted at the uncinate process. No definite pseudocyst formation seen. 2. Diffuse dilatation of the intrahepatic biliary ducts and common bile duct, as previously noted, with a common bile duct stent seen in expected position. Diffuse dilatation of the pancreatic duct. 3. Mild bibasilar airspace opacities likely reflect atelectasis. 4. Scattered coronary artery calcifications seen. 5. Small left renal cysts noted. 6. Mild degenerative change along the lower thoracic and lumbar spine. Aortic Atherosclerosis (ICD10-I70.0). Electronically Signed   By: Garald Balding M.D.   On: 03/27/2018 23:24    Impression: History of ascending cholangitis and distal stricture with sphincterotomy and plastic stent placement on 03/13/18,since with worsening LFTs, could be related to stent malfunction Pancreatitis  Plan: Clear liquid diet and nothing by mouth past midnight for ERCP to be performed in a.m.Marland Kitchen Depending upon cholangiogram may need new stent placement if the distal CBD obstruction/stricture is persistent  The risks(infection, bleeding, perforation, pancreatitis, even death) and benefits of the procedure were discussed with the patient in details with the patient, his son and daughter-in-law at bedside. Understand and verbalized consent   Currently on normal saline at 100 mL per hour pancreatitis Not in pain  LOS: 0 days   Ronnette Juniper, M.D.  03/28/2018, 10:14 AM  Pager [phone] If no answer or after 5 PM call [phone]

## 2018-03-29 ENCOUNTER — Encounter (HOSPITAL_COMMUNITY)
Admission: EM | Disposition: A | Discharge status: Home Health | Payer: Self-pay | Source: Home / Self Care | Attending: Family Medicine

## 2018-03-29 ENCOUNTER — Inpatient Hospital Stay (HOSPITAL_COMMUNITY): Payer: Medicare Other

## 2018-03-29 ENCOUNTER — Encounter (HOSPITAL_COMMUNITY): Payer: Self-pay | Admitting: Certified Registered"

## 2018-03-29 ENCOUNTER — Inpatient Hospital Stay (HOSPITAL_COMMUNITY): Payer: Medicare Other | Admitting: Certified Registered"

## 2018-03-29 HISTORY — PX: ERCP: SHX5425

## 2018-03-29 HISTORY — PX: STENT REMOVAL: SHX6421

## 2018-03-29 LAB — HEPATIC FUNCTION PANEL
ALT: 114 U/L — AB (ref 0–44)
AST: 115 U/L — ABNORMAL HIGH (ref 15–41)
Albumin: 2.4 g/dL — ABNORMAL LOW (ref 3.5–5.0)
Alkaline Phosphatase: 713 U/L — ABNORMAL HIGH (ref 38–126)
BILIRUBIN DIRECT: 6.2 mg/dL — AB (ref 0.0–0.2)
BILIRUBIN INDIRECT: 2.4 mg/dL — AB (ref 0.3–0.9)
TOTAL PROTEIN: 5.6 g/dL — AB (ref 6.5–8.1)
Total Bilirubin: 8.6 mg/dL — ABNORMAL HIGH (ref 0.3–1.2)

## 2018-03-29 LAB — MRSA PCR SCREENING: MRSA BY PCR: NEGATIVE

## 2018-03-29 LAB — GLUCOSE, CAPILLARY: Glucose-Capillary: 146 mg/dL — ABNORMAL HIGH (ref 70–99)

## 2018-03-29 SURGERY — ERCP, WITH INTERVENTION IF INDICATED
Anesthesia: General

## 2018-03-29 MED ORDER — FENTANYL CITRATE (PF) 100 MCG/2ML IJ SOLN
25.0000 ug | INTRAMUSCULAR | Status: DC | PRN
  Administered 2018-03-29 (×2): 25 ug via INTRAVENOUS

## 2018-03-29 MED ORDER — LIDOCAINE 2% (20 MG/ML) 5 ML SYRINGE
INTRAMUSCULAR | Status: DC | PRN
  Administered 2018-03-29: 50 mg via INTRAVENOUS

## 2018-03-29 MED ORDER — ONDANSETRON HCL 4 MG/2ML IJ SOLN: 4.0000 mg | Freq: Once | INTRAMUSCULAR | Status: DC | PRN

## 2018-03-29 MED ORDER — IOPAMIDOL (ISOVUE-300) INJECTION 61%
INTRAVENOUS | Status: AC
  Filled 2018-03-29: qty 50

## 2018-03-29 MED ORDER — ONDANSETRON HCL 4 MG/2ML IJ SOLN
INTRAMUSCULAR | Status: DC | PRN
  Administered 2018-03-29: 2 mg via INTRAVENOUS

## 2018-03-29 MED ORDER — VASOPRESSIN 20 UNIT/ML IV SOLN
INTRAVENOUS | Status: DC | PRN
  Administered 2018-03-29 (×2): 1 [IU] via INTRAVENOUS

## 2018-03-29 MED ORDER — SODIUM CHLORIDE 0.9 % IV SOLN: 2.0000 g | INTRAVENOUS | Status: DC

## 2018-03-29 MED ORDER — SUCCINYLCHOLINE CHLORIDE 20 MG/ML IJ SOLN
INTRAMUSCULAR | Status: DC | PRN
  Administered 2018-03-29: 80 mg via INTRAVENOUS

## 2018-03-29 MED ORDER — SODIUM CHLORIDE 0.9 % IV SOLN
INTRAVENOUS | Status: DC | PRN
  Administered 2018-03-29: 10 ug/min via INTRAVENOUS

## 2018-03-29 MED ORDER — INDOMETHACIN 50 MG RE SUPP
RECTAL | Status: AC
  Filled 2018-03-29: qty 2

## 2018-03-29 MED ORDER — PROPOFOL 10 MG/ML IV BOLUS
INTRAVENOUS | Status: DC | PRN
  Administered 2018-03-29 (×2): 10 mg via INTRAVENOUS
  Administered 2018-03-29: 50 mg via INTRAVENOUS
  Administered 2018-03-29: 40 mg via INTRAVENOUS
  Administered 2018-03-29: 10 mg via INTRAVENOUS

## 2018-03-29 MED ORDER — LACTATED RINGERS IV SOLN
INTRAVENOUS | Status: DC | PRN
  Administered 2018-03-29: 08:00:00 via INTRAVENOUS

## 2018-03-29 MED ORDER — PHENYLEPHRINE HCL 10 MG/ML IJ SOLN
INTRAMUSCULAR | Status: DC | PRN
  Administered 2018-03-29 (×7): 100 ug via INTRAVENOUS

## 2018-03-29 MED ORDER — SODIUM CHLORIDE 0.9 % IV SOLN
1.0000 g | INTRAVENOUS | Status: DC
  Administered 2018-03-30 – 2018-03-31 (×2): 1 g via INTRAVENOUS
  Filled 2018-03-29 (×5): qty 10

## 2018-03-29 MED ORDER — VASOPRESSIN 20 UNIT/ML IV SOLN
INTRAVENOUS | Status: AC
  Filled 2018-03-29: qty 1

## 2018-03-29 MED ORDER — GLUCAGON HCL RDNA (DIAGNOSTIC) 1 MG IJ SOLR
INTRAMUSCULAR | Status: AC
  Filled 2018-03-29: qty 1

## 2018-03-29 MED ORDER — FENTANYL CITRATE (PF) 100 MCG/2ML IJ SOLN
INTRAMUSCULAR | Status: AC
  Administered 2018-03-29: 25 ug via INTRAVENOUS
  Filled 2018-03-29: qty 2

## 2018-03-29 MED ORDER — CIPROFLOXACIN IN D5W 400 MG/200ML IV SOLN
INTRAVENOUS | Status: AC
  Filled 2018-03-29: qty 200

## 2018-03-29 MED ORDER — INDOMETHACIN 50 MG RE SUPP
RECTAL | Status: DC | PRN
  Administered 2018-03-29: 100 mg via RECTAL

## 2018-03-29 MED ORDER — SODIUM CHLORIDE 0.9 % IV SOLN: INTRAVENOUS | Status: DC

## 2018-03-29 MED ORDER — CIPROFLOXACIN IN D5W 400 MG/200ML IV SOLN
INTRAVENOUS | Status: DC | PRN
  Administered 2018-03-29: 400 mg via INTRAVENOUS

## 2018-03-29 MED ORDER — SODIUM CHLORIDE 0.9 % IV SOLN
2.0000 g | Freq: Once | INTRAVENOUS | Status: AC
  Administered 2018-03-29: 2 g via INTRAVENOUS
  Filled 2018-03-29: qty 20

## 2018-03-29 NOTE — Anesthesia Preprocedure Evaluation (Addendum)
Anesthesia Evaluation  Patient identified by MRN, date of birth, ID band Patient awake    Reviewed: Allergy & Precautions, NPO status , Patient's Chart, lab work & pertinent test results, reviewed documented beta blocker date and time   Airway Mallampati: II  TM Distance: >3 FB Neck ROM: Full    Dental  (+) Dental Advisory Given, Missing   Pulmonary neg pulmonary ROS,    Pulmonary exam normal breath sounds clear to auscultation       Cardiovascular hypertension, Pt. on home beta blockers Normal cardiovascular exam Rhythm:Regular Rate:Normal     Neuro/Psych PSYCHIATRIC DISORDERS Anxiety  Neuromuscular disease    GI/Hepatic GERD  ,Obstructive jaundice   Endo/Other  negative endocrine ROS  Renal/GU negative Renal ROS     Musculoskeletal  (+) Arthritis ,   Abdominal   Peds  Hematology  (+) Blood dyscrasia, anemia ,   Anesthesia Other Findings Day of surgery medications reviewed with the patient.  Reproductive/Obstetrics                            Anesthesia Physical Anesthesia Plan  ASA: III  Anesthesia Plan: General   Post-op Pain Management:    Induction: Intravenous  PONV Risk Score and Plan: 2 and Treatment may vary due to age or medical condition, Ondansetron and Dexamethasone  Airway Management Planned: Oral ETT  Additional Equipment:   Intra-op Plan:   Post-operative Plan: Extubation in OR  Informed Consent: I have reviewed the patients History and Physical, chart, labs and discussed the procedure including the risks, benefits and alternatives for the proposed anesthesia with the patient or authorized representative who has indicated his/her understanding and acceptance.   Dental advisory given  Plan Discussed with: CRNA  Anesthesia Plan Comments:         Anesthesia Quick Evaluation

## 2018-03-29 NOTE — Op Note (Addendum)
ERCP was performed for abnormal liver enzymes, prior sphincterotomy and plastic stent placed on 03/12/18 for distal biliary stricture. Post cholecystectomy.   Findings: A plastic CBD stent was noted in the duodenum, displaced distally with half the stent in the duodenal lumen. A standard sphincterotome was used to cannulate the CBD beside the stent and a wire was left in the right intrahepatic duct. During removal of the plastic stent, it broke into two pieces, each piece was retrieved successfully with a snare. During such maneuver, wire placement in the right intrahepatic duct was lost. The ampulla appeared erythematous, friable and with clots after stent was removed. Despite several attempts the CBD could not be recannulated.  Recommendations: Liquid diet today Nothing by mouth post midnight Repeat ERCP in a.m. Hopefully, after 24 hours, edema at the ampulla would have subsided enough to allow recannulation of CBD Liver enzymes today and tomorrow  Jose [name], M.D.

## 2018-03-29 NOTE — Anesthesia Procedure Notes (Signed)
Procedure Name: Intubation Date/Time: 03/29/2018 8:17 AM Performed by: Adalberto Ill, CRNA Pre-anesthesia Checklist: Patient identified, Emergency Drugs available, Suction available, Patient being monitored and Timeout performed Patient Re-evaluated:Patient Re-evaluated prior to induction Oxygen Delivery Method: Circle system utilized Preoxygenation: Pre-oxygenation with 100% oxygen Induction Type: IV induction Ventilation: Mask ventilation without difficulty Laryngoscope Size: Miller and 2 Grade View: Grade I Tube type: Oral Tube size: 7.0 mm Number of attempts: 1 Airway Equipment and Method: Stylet Placement Confirmation: ETT inserted through vocal cords under direct vision,  positive ETCO2 and breath sounds checked- equal and bilateral Secured at: 23 cm Tube secured with: Tape Dental Injury: Teeth and Oropharynx as per pre-operative assessment

## 2018-03-29 NOTE — Interval H&P Note (Signed)
History and Physical Interval Note: 89/male with plastic CBD stent in place with elevated liver enzymes, dilated intrahepatics and pancreatitis for an ERCP today.  03/29/2018 8:05 AM  Jose [name]  has presented today for ERCP, with the diagnosis of Obstructive jaundice  The various methods of treatment have been discussed with the patient and family. After consideration of risks, benefits and other options for treatment, the patient has consented to  Procedure(s): ENDOSCOPIC RETROGRADE CHOLANGIOPANCREATOGRAPHY (ERCP) (N/A) as a surgical intervention .  The patient's history has been reviewed, patient examined, no change in status, stable for surgery.  I have reviewed the patient's chart and labs.  Questions were answered to the patient's satisfaction.     Ronnette Juniper

## 2018-03-29 NOTE — Progress Notes (Signed)
Patient Demographics:    Jose [name], is a 84 y.o. male, DOB - [birth year], XFG:[id number]  Admit date - 03/27/2018   Admitting Physician [name], MD  Outpatient Primary MD for the patient is [name], MD  LOS - 1   Chief Complaint  Patient presents with  . Abnormal Lab  . Weakness        Subjective:    Jose [name] today has no fevers, no emesis,  No chest pain, son at bedside, appetite is poor,  Assessment  & Plan :    Principal Problem:   Obstructive jaundice Active Problems:   Essential hypertension, benign   Anemia   Elevated liver enzymes   Pancreatitis due to biliary obstruction   History of ESBL E. coli infection   Acute pancreatitis   Hyperbilirubinemia   Brief summary:- 84 y.o.malesince today with complaints of epigastric discomfort and abnormal lab showing elevated liver enzymes, status post recent lap chole with ERCP that identified a stricture, status post biliary stenting on 03/12/2018, discharged on Levaquin on 03/17/2018.  Readmitted on 03/28/2018 with persistent abdominal pain and elevated LFTs with a T bili of 4.7, with CT abdomen and clinical exam consistent with acute cryptitis  Plan:- 1)Abd Pain/obstructive jaundice --- discussed with Dr. Therisa Doyne from [facility] patient underwent "incomplete"  ERCP on 03/29/2018 with retrieval of plastic stent, plan is for Repeat ERCP i11/11/19,  Hopefully, after 24 hours, edema at the ampulla would have subsided enough to allow recannulation of CBD on 03/30/18, LFTs are very elevated raising concerns about possible ascending cholangitis in the patient with persistent obstructive jaundice and status post recent ERCP and recent plastic biliary stent placement with subsequent removal on 03/29/2018, treat empirically with IV Rocephin for possible cholangitis as noted above, blood cultures are pending  2)HTN---  stable, continue  metoprolol 25 mg twice daily ,   may use IV labetalol when necessary  Every 4 hours for systolic blood pressure over 160 mmhg  3)Recent ESBL Bacteremia, patient was treated with Meropenem as inpatient at [facility] and discharged home on Levaquin which he completed on 03/26/2018, IV Rocephin as above #1 pending blood cultures  4)FEN--- clear liquid diet for now, dextrose half-normal saline solution at 50 mm an hour, n.p.o. after midnight for ERCP on 03/30/2018  Disposition/Need for in-Hospital Stay- patient unable to be discharged at this time due to worsening LFTs and obstructive jaundice requiring repeat ERCP, also needs IV antibiotics pending blood culture results  Code Status : Full   Disposition Plan  : TBD  Consults  :  Gi  DVT Prophylaxis  :  - SCDs   Lab Results  Component Value Date   PLT 165 03/28/2018    Inpatient Medications  Scheduled Meds: . metoprolol tartrate  25 mg Oral BID   Continuous Infusions: . sodium chloride    . [START ON 03/30/2018] cefTRIAXone (ROCEPHIN)  IV    . dextrose 5 % and 0.45% NaCl 75 mL/hr at 03/29/18 1628  . famotidine (PEPCID) IV Stopped (03/29/18 1131)   PRN Meds:.acetaminophen **OR** acetaminophen, labetalol, ondansetron **OR** ondansetron (ZOFRAN) IV    Anti-infectives (From admission, onward)   Start     Dose/Rate Route Frequency Ordered Stop   03/30/18 1600  cefTRIAXone (ROCEPHIN) 1 g in sodium chloride 0.9 % 100 mL IVPB     1 g 200 mL/hr over 30 Minutes Intravenous Every 24 hours 03/29/18 1430     03/29/18 1445  cefTRIAXone (ROCEPHIN) 2 g in sodium chloride 0.9 % 100 mL IVPB     2 g 200 mL/hr over 30 Minutes Intravenous  Once 03/29/18 1430 03/29/18 1618   03/29/18 1430  cefTRIAXone (ROCEPHIN) 2 g in sodium chloride 0.9 % 100 mL IVPB  Status:  Discontinued     2 g 200 mL/hr over 30 Minutes Intravenous Every 24 hours 03/29/18 1429 03/29/18 1430        Objective:   Vitals:   03/29/18 1014 03/29/18 1029  03/29/18 1030 03/29/18 1053  BP: (!) 147/67 136/76  134/86  Pulse: 74 75 76 81  Resp: (!) 23 17 18 17   Temp:   (!) 97.3 F (36.3 C) 97.7 F (36.5 C)  TempSrc:    Oral  SpO2: 98% 95% 96% 98%  Weight:        Wt Readings from Last 3 Encounters:  03/28/18 58.2 kg  03/24/18 57.6 kg  03/12/18 56.9 kg     Intake/Output Summary (Last 24 hours) at 03/29/2018 1701 Last data filed at 03/29/2018 1628 Gross per 24 hour  Intake 3091.67 ml  Output 627 ml  Net 2464.67 ml     Physical Exam Patient is examined daily including today on 03/29/18 , exams remain the same as of yesterday except that has changed   Gen:- Awake Alert, looks tired, does not look toxic HEENT:- [place].AT, No sclera icterus Neck-Supple Neck,No JVD,.  Lungs-  CTAB , fair symmetrical air movement CV- S1, S2 normal, regular  Abd-  +ve B.Sounds, Abd Soft, epigastric and right upper quadrant tenderness,   without rebound or guarding Extremity/Skin:- No  edema, pedal pulses present  Psych-affect is appropriate, oriented x3 Neuro-no new focal deficits, no tremors   Data Review:   Micro Results Recent Results (from the past 240 hour(s))  Culture, blood (routine x 2)     Status: None (Preliminary result)   Collection Time: 03/28/18 12:40 AM  Result Value Ref Range Status   Specimen Description BLOOD RIGHT ARM  Final   Special Requests   Final    BOTTLES DRAWN AEROBIC AND ANAEROBIC Blood Culture results may not be optimal due to an excessive volume of blood received in culture bottles   Culture   Final    NO GROWTH 1 DAY Performed at [facility], [place] [address], [place], [place] [address]    Report Status PENDING  Incomplete  Culture, blood (routine x 2)     Status: None (Preliminary result)   Collection Time: 03/28/18  1:00 AM  Result Value Ref Range Status   Specimen Description BLOOD RIGHT HAND  Final   Special Requests   Final    BOTTLES DRAWN AEROBIC ONLY Blood Culture results may not be optimal due to  an excessive volume of blood received in culture bottles   Culture   Final    NO GROWTH 1 DAY Performed at [facility], [place] [address] [place], [place] [address]    Report Status PENDING  Incomplete  MRSA PCR Screening     Status: None   Collection Time: 03/29/18  7:37 AM  Result Value Ref Range Status   MRSA by PCR NEGATIVE NEGATIVE Final    Comment:        The GeneXpert MRSA Assay (  FDA approved for NASAL specimens only), is one component of a comprehensive MRSA colonization surveillance program. It is not intended to diagnose MRSA infection nor to guide or monitor treatment for MRSA infections. Performed at [facility], [place] [address], [place], [place] [address]     Radiology Reports Ct Abdomen Pelvis W Contrast  Result Date: 03/27/2018 CLINICAL DATA:  Acute onset of generalized weakness. Elevated bilirubin. EXAM: CT ABDOMEN AND PELVIS WITH CONTRAST TECHNIQUE: Multidetector CT imaging of the abdomen and pelvis was performed using the standard protocol following bolus administration of intravenous contrast. CONTRAST:  157mL OMNIPAQUE IOHEXOL 300 MG/ML  SOLN COMPARISON:  CT of the abdomen and pelvis performed 03/11/2018, and MRCP performed 03/12/2018 FINDINGS: Lower chest: Mild bibasilar airspace opacities likely reflect atelectasis. Scattered coronary artery calcifications are seen. Hepatobiliary: There is diffuse dilatation of the intrahepatic biliary ducts and common bile duct, as previously noted, with a common bile duct stent seen in expected position. There is also diffuse dilatation of the pancreatic duct. The liver is otherwise grossly unremarkable in appearance. The patient is status post cholecystectomy, with clips noted at the gallbladder fossa. Pancreas: Diffuse soft tissue inflammation about the pancreas is compatible with pancreatitis, as previously noted, with slightly decreased enhancement noted at the uncinate process. No definite pseudocyst formation is  noted. Spleen: The spleen is unremarkable in appearance. Adrenals/Urinary Tract: The adrenal glands are unremarkable in appearance. Small left renal cysts are noted. There is no evidence of hydronephrosis. No renal or ureteral stones are identified. No perinephric stranding is seen. Stomach/Bowel: The stomach is unremarkable in appearance. The small bowel is within normal limits. The appendix is normal in caliber, without evidence of appendicitis. The colon is unremarkable in appearance. Vascular/Lymphatic: Scattered calcification is seen along the abdominal aorta and its branches. The abdominal aorta is otherwise grossly unremarkable. The inferior vena cava is grossly unremarkable. No retroperitoneal lymphadenopathy is seen. No pelvic sidewall lymphadenopathy is identified. Reproductive: The bladder is mildly distended and grossly unremarkable. The prostate is borderline normal in size. Other: No additional soft tissue abnormalities are seen. Musculoskeletal: No acute osseous abnormalities are identified. Multilevel vacuum phenomenon is noted along the lower thoracic and lumbar spine, with mild underlying facet disease. The visualized musculature is unremarkable in appearance. IMPRESSION: 1. Diffuse soft tissue inflammation about the pancreas is compatible with acute pancreatitis, as previously noted, with slightly decreased enhancement noted at the uncinate process. No definite pseudocyst formation seen. 2. Diffuse dilatation of the intrahepatic biliary ducts and common bile duct, as previously noted, with a common bile duct stent seen in expected position. Diffuse dilatation of the pancreatic duct. 3. Mild bibasilar airspace opacities likely reflect atelectasis. 4. Scattered coronary artery calcifications seen. 5. Small left renal cysts noted. 6. Mild degenerative change along the lower thoracic and lumbar spine. Aortic Atherosclerosis (ICD10-I70.0). Electronically Signed   By: Garald Balding M.D.   On:  03/27/2018 23:24   Ct Abdomen Pelvis W Contrast  Result Date: 03/11/2018 CLINICAL DATA:  Increased weakness with generalized abdominal pain EXAM: CT ABDOMEN AND PELVIS WITH CONTRAST TECHNIQUE: Multidetector CT imaging of the abdomen and pelvis was performed using the standard protocol following bolus administration of intravenous contrast. CONTRAST:  43mL OMNIPAQUE IOHEXOL 300 MG/ML  SOLN COMPARISON:  01/04/2018, 12/27/2017 FINDINGS: Lower chest: Lung bases demonstrate hazy dependent atelectasis. Heart size within normal limits. Hepatobiliary: Intra and extrahepatic biliary dilatation status post cholecystectomy. Common bile duct diameter up to 13 mm. Pancreas: Diffuse ductal dilatation. Hazy indistinct appearance of pancreas  consistent with pancreatitis. No organized fluid collection. Spleen: Normal in size without focal abnormality. Adrenals/Urinary Tract: Adrenal glands are normal. Scarring left kidney. Cyst mid left kidney. No hydronephrosis. The bladder is normal Stomach/Bowel: Stomach is nonenlarged. No dilated small bowel. No colon wall thickening. Sigmoid colon diverticula without acute inflammatory process. Mild retained feces at the rectum. Vascular/Lymphatic: Moderate aortic atherosclerosis. No aneurysm. No significantly enlarged lymph nodes. Reproductive: Prostate calcification with slight enlargement. Other: Negative for free air or free fluid. Small fat in the left inguinal canal. Musculoskeletal: Moderate severe diffuse degenerative changes. Trace retrolisthesis L3 on L4, L2 on L3. No acute or suspicious abnormality. IMPRESSION: 1. Indistinct appearance of pancreas with mild surrounding edema/stranding, suspicious for an acute pancreatitis. No organized fluid collection. 2. Interval cholecystectomy with development of intra and extrahepatic biliary enlargement which may be correlated with LFTs. 3. Sigmoid colon diverticular disease without acute inflammatory process Electronically Signed   By:  Donavan Foil M.D.   On: 03/11/2018 23:09   Mr Abdomen Mrcp Wo Contrast  Result Date: 03/12/2018 CLINICAL DATA:  Abdominal pain.  Suspected pancreatitis. EXAM: MRI ABDOMEN WITHOUT CONTRAST  (INCLUDING MRCP) TECHNIQUE: Multiplanar multisequence MR imaging of the abdomen was performed. Heavily T2-weighted images of the biliary and pancreatic ducts were obtained, and three-dimensional MRCP images were rendered by post processing. COMPARISON:  CT scan 03/11/2018 FINDINGS: Lower chest: The lung bases are clear of an acute process. No pleural or pericardial effusion. Hepatobiliary: Moderate intra and extrahepatic biliary dilatation. The common bile duct measures up to 13 mm in the porta hepatis but narrows quickly in the pancreatic head. No focal hepatic lesions. Gallbladder is surgically absent. Pancreas: The pancreas is enlarged and inflamed consistent with acute pancreatitis. Abrupt caliber change of the pancreatic duct in the pancreatic head likely due to inflammation. No masses identified. Spleen:  Normal size.  No focal lesions. Adrenals/Urinary Tract: The adrenal glands and kidneys are grossly normal. Small left renal cysts are noted. Stomach/Bowel: Visualized portions within the abdomen are unremarkable. Vascular/Lymphatic: No pathologically enlarged lymph nodes identified. No abdominal aortic aneurysm demonstrated. Other:  No ascites or abdominal wall hernia. Musculoskeletal: No significant bony findings. IMPRESSION: 1. Enlarged and inflamed pancreas consistent with acute pancreatitis. 2. Intra and extrahepatic biliary dilatation with compression of the distal common bile duct in the head of the pancreas likely due to the surrounding pancreatitis. Similar findings involving the main pancreatic duct. 3. Status post cholecystectomy. 4. Simple appearing renal cysts. Electronically Signed   By: Marijo Sanes M.D.   On: 03/12/2018 19:48   Mr 3d Recon At Scanner  Result Date: 03/12/2018 CLINICAL DATA:   Abdominal pain.  Suspected pancreatitis. EXAM: MRI ABDOMEN WITHOUT CONTRAST  (INCLUDING MRCP) TECHNIQUE: Multiplanar multisequence MR imaging of the abdomen was performed. Heavily T2-weighted images of the biliary and pancreatic ducts were obtained, and three-dimensional MRCP images were rendered by post processing. COMPARISON:  CT scan 03/11/2018 FINDINGS: Lower chest: The lung bases are clear of an acute process. No pleural or pericardial effusion. Hepatobiliary: Moderate intra and extrahepatic biliary dilatation. The common bile duct measures up to 13 mm in the porta hepatis but narrows quickly in the pancreatic head. No focal hepatic lesions. Gallbladder is surgically absent. Pancreas: The pancreas is enlarged and inflamed consistent with acute pancreatitis. Abrupt caliber change of the pancreatic duct in the pancreatic head likely due to inflammation. No masses identified. Spleen:  Normal size.  No focal lesions. Adrenals/Urinary Tract: The adrenal glands and kidneys are grossly normal. Small left renal  cysts are noted. Stomach/Bowel: Visualized portions within the abdomen are unremarkable. Vascular/Lymphatic: No pathologically enlarged lymph nodes identified. No abdominal aortic aneurysm demonstrated. Other:  No ascites or abdominal wall hernia. Musculoskeletal: No significant bony findings. IMPRESSION: 1. Enlarged and inflamed pancreas consistent with acute pancreatitis. 2. Intra and extrahepatic biliary dilatation with compression of the distal common bile duct in the head of the pancreas likely due to the surrounding pancreatitis. Similar findings involving the main pancreatic duct. 3. Status post cholecystectomy. 4. Simple appearing renal cysts. Electronically Signed   By: Marijo Sanes M.D.   On: 03/12/2018 19:48   Dg Chest Portable 1 View  Result Date: 03/11/2018 CLINICAL DATA:  Weakness 1 week. EXAM: PORTABLE CHEST 1 VIEW COMPARISON:  12/27/2017 FINDINGS: Lungs are adequately inflated without focal  consolidation or effusion. Cardiomediastinal silhouette and remainder of the exam is unchanged. IMPRESSION: No active disease. Electronically Signed   By: Marin Olp M.D.   On: 03/11/2018 21:41   Dg Ercp  Result Date: 03/29/2018 CLINICAL DATA:  84 year old male with acute pancreatitis and biliary ductal dilatation. EXAM: ERCP TECHNIQUE: Multiple spot images obtained with the fluoroscopic device and submitted for interpretation post-procedure. FLUOROSCOPY TIME:  Fluoroscopy Time:  1 minutes 34 seconds reported COMPARISON:  None. FINDINGS: A single intraoperative saved image is submitted for review. The image demonstrates a flexible endoscope in the descending duodenum with partial wire cannulation. No cholangiogram images were submitted. Please see procedural note for further detail. IMPRESSION: ERCP as above.  Please see procedural note for further detail. These images were submitted for radiologic interpretation only. Please see the procedural report for the amount of contrast and the fluoroscopy time utilized. Electronically Signed   By: Jacqulynn Cadet M.D.   On: 03/29/2018 10:57   Dg C-arm 1-60 Min-no Report  Result Date: 03/13/2018 Fluoroscopy was utilized by the requesting physician.  No radiographic interpretation.   US Abdomen Limited Ruq  Result Date: 03/12/2018 CLINICAL DATA:  84 year old with pancreatitis. Cholecystectomy last month. Elevated LFTs and bilirubin. EXAM: ULTRASOUND ABDOMEN LIMITED RIGHT UPPER QUADRANT COMPARISON:  Abdominal CT yesterday. FINDINGS: Gallbladder: Surgically absent.  No fluid collection in the gallbladder fossa. Common bile duct: Diameter: Dilated at 11 mm.  Distal CBD is obscured. Liver: Intrahepatic biliary ductal dilatation. No focal lesion identified. Within normal limits in parenchymal echogenicity. Portal vein is patent on color Doppler imaging with normal direction of blood flow towards the liver. IMPRESSION: 1. Intra and extrahepatic biliary ductal  dilatation, seen on CT. The distal common bile duct is obscured. No choledocholithiasis were visualized. 2. Postcholecystectomy. Electronically Signed   By: Keith Rake M.D.   On: 03/12/2018 01:22     CBC Recent Labs  Lab 03/26/18 1730 03/27/18 1854 03/28/18 0425  WBC 14.5* 9.6 7.9  HGB 11.8* 10.2* 9.0*  HCT 35.1* 33.1* 29.0*  PLT 199.0 177 165  MCV 99.1 104.1* 99.7  MCH  --  32.1 30.9  MCHC 33.5 30.8 31.0  RDW 16.3* 15.6* 15.9*  LYMPHSABS  --  0.6* 0.9  MONOABS  --  1.0 1.0  EOSABS  --  0.4 0.2  BASOSABS  --  0.0 0.0    Chemistries  Recent Labs  Lab 03/26/18 1730 03/27/18 1854 03/28/18 0425 03/29/18 1058  NA 135 136 135  --   K 5.4* 4.7 4.4  --   CL 102 106 108  --   CO2 24 20* 21*  --   GLUCOSE 130* 117* 84  --   BUN 21 23 18   --  CREATININE 0.95 1.03 1.00  --   CALCIUM 9.6 9.2 8.6*  --   MG  --  2.0  --   --   AST 197* 92* 105* 115*  ALT 199* 151* 115* 114*  ALKPHOS 691* 558* 489* 713*  BILITOT 4.7* 4.7* 5.4* 8.6*   ------------------------------------------------------------------------------------------------------------------ No results for input(s): CHOL, HDL, LDLCALC, TRIG, CHOLHDL, LDLDIRECT in the last 72 hours.  No results found for: HGBA1C ------------------------------------------------------------------------------------------------------------------ No results for input(s): TSH, T4TOTAL, T3FREE, THYROIDAB in the last 72 hours.  Invalid input(s): FREET3 ------------------------------------------------------------------------------------------------------------------ No results for input(s): VITAMINB12, FOLATE, FERRITIN, TIBC, IRON, RETICCTPCT in the last 72 hours.  Coagulation profile No results for input(s): INR, PROTIME in the last 168 hours.  No results for input(s): DDIMER in the last 72 hours.  Cardiac Enzymes No results for input(s): CKMB, TROPONINI, MYOGLOBIN in the last 168 hours.  Invalid input(s):  CK ------------------------------------------------------------------------------------------------------------------ No results found for: BNP   Roxan Hockey M.D on 03/29/2018 at 5:01 PM  Pager---[phone] Go to [email or web address] - password [email or web address] for contact info  [facility] - Office  [phone]

## 2018-03-29 NOTE — Anesthesia Postprocedure Evaluation (Signed)
Anesthesia Post Note  Patient: Jose [name]  Procedure(s) Performed: ENDOSCOPIC RETROGRADE CHOLANGIOPANCREATOGRAPHY (ERCP) (N/A ) ERCP ABORTED CASE STENT REMOVAL     Patient location during evaluation: PACU Anesthesia Type: General Level of consciousness: awake and alert, awake and oriented Pain management: pain level controlled Vital Signs Assessment: post-procedure vital signs reviewed and stable Respiratory status: spontaneous breathing, nonlabored ventilation and respiratory function stable Cardiovascular status: blood pressure returned to baseline and stable Postop Assessment: no apparent nausea or vomiting Anesthetic complications: no    Last Vitals:  Vitals:   03/29/18 1030 03/29/18 1053  BP:  134/86  Pulse: 76 81  Resp: 18 17  Temp: (!) 36.3 C 36.5 C  SpO2: 96% 98%    Last Pain:  Vitals:   03/29/18 1053  TempSrc: Oral  PainSc:                  Catalina Gravel

## 2018-03-29 NOTE — Brief Op Note (Signed)
03/27/2018 - 03/29/2018  9:16 AM  PATIENT:  Jose [name]  84 y.o. male  PRE-OPERATIVE DIAGNOSIS:  Obstructive jaundice  POST-OPERATIVE DIAGNOSIS:  * No post-op diagnosis entered *  PROCEDURE:  Procedure(s): ENDOSCOPIC RETROGRADE CHOLANGIOPANCREATOGRAPHY (ERCP) (N/A) ERCP ABORTED CASE  SURGEON:  Surgeon(s) and Role:    Ronnette Juniper, MD - Primary  PHYSICIAN ASSISTANT:   ASSISTANTS: Burtis Junes, Rn, Hope Parker, Tech   ANESTHESIA:   MAC  EBL:  2 mL   BLOOD ADMINISTERED:none  DRAINS: none   LOCAL MEDICATIONS USED:  NONE  SPECIMEN:  No Specimen  DISPOSITION OF SPECIMEN:  N/A  COUNTS:  YES  TOURNIQUET:  * No tourniquets in log *  DICTATION: .Dragon Dictation  PLAN OF CARE: Admit to inpatient   PATIENT DISPOSITION:  PACU - hemodynamically stable.   Delay start of Pharmacological VTE agent (>24hrs) due to surgical blood loss or risk of bleeding: no

## 2018-03-29 NOTE — Transfer of Care (Signed)
Immediate Anesthesia Transfer of Care Note  Patient: Jose [name]  Procedure(s) Performed: ENDOSCOPIC RETROGRADE CHOLANGIOPANCREATOGRAPHY (ERCP) (N/A ) ERCP ABORTED CASE  Patient Location: PACU  Anesthesia Type:General  Level of Consciousness: awake, alert , oriented and patient cooperative  Airway & Oxygen Therapy: Patient Spontanous Breathing and Patient connected to nasal cannula oxygen  Post-op Assessment: Report given to RN and Post -op Vital signs reviewed and stable  Post vital signs: Reviewed and stable  Last Vitals:  Vitals Value Taken Time  BP 125/70 03/29/2018  9:44 AM  Temp    Pulse 77 03/29/2018  9:46 AM  Resp 22 03/29/2018  9:46 AM  SpO2 99 % 03/29/2018  9:46 AM  Vitals shown include unvalidated device data.  Last Pain:  Vitals:   03/29/18 0756  TempSrc: Oral  PainSc: 0-No pain         Complications: No apparent anesthesia complications

## 2018-03-29 NOTE — Op Note (Addendum)
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 Estimated blood loss:                            Minimal. Estimated Blood Loss:     Estimated blood loss was minimal. Procedure:                Pre-Anesthesia Assessment:                           - Prior to the procedure, a History and Physical                            was performed, and patient medications and                            allergies were reviewed. The patient's tolerance of                            previous anesthesia was also reviewed. The risks                            and benefits of the procedure and the sedation                            options and risks were discussed with the patient.                            All questions were answered, and informed consent                            was obtained. Prior Anticoagulants: The patient has                            taken no previous anticoagulant or antiplatelet                            agents. ASA Grade Assessment: II -  A patient with                            mild systemic disease. After reviewing the risks                            and benefits, the patient was deemed in                            satisfactory condition to undergo the procedure.                           After obtaining informed consent, the scope was                            passed under direct vision. Throughout the                            procedure,  the patient's blood pressure, pulse, and                            oxygen saturations were monitored continuously. The                            TJF-Q180V ([id number]) Olympus ERCP was introduced                            through the mouth, The procedure was aborted. The                            scope was not inserted. bile ducts Medications were                            duodenum. The ERCP was accomplished without                            difficulty. The patient tolerated the procedure                            well. Scope In: Scope Out: Findings:      The scout film was normal. The esophagus was successfully intubated       under direct vision. The scope was advanced to a normal major papilla in       the descending duodenum without detailed examination of the pharynx,       larynx and associated structures, and upper GI tract. The upper GI tract       was grossly normal. A plastic stent appeared displaced distally with       half of the stent in to the duodenum. A sphinctertome was used to       canulate the CBD beside the stent and wire was left into the right       intrahepatic duct. One stent was removed from the common bile duct using       a snare and sent for cytology. The stent broke in two pieces and both  pieces were successfully retrived with a snare. However, it resulted in       bleeding at the ampulla and clot formation. During removal of the stent,       the wire in the right intrahepatic duct also came out. Thorough lavage       was performed to visualize the ampullary opening and prior site of       sphincterotmy. Although visualization improved once bleeding stopped,       the ampulla appeared edematous and friable and despite mulitple attempts       the CBD could not be cannulated. Impression:               - One stent was removed from the common bile duct.                           - The CBD could not be recanulated due to bleeding,                             edema and friability at the ampulla. Moderate Sedation:      Patient did not receive moderate sedation for this procedure, but       instead received monitored anesthesia care. Recommendation:           - Clear liquid diet.                           - Repeat ERCP tomorrow for retreatment. Procedure Code(s):        --- Professional ---                           [phone], 6, Endoscopic retrograde                            cholangiopancreatography (ERCP); with removal of                            foreign body(s) or stent(s) from biliary/pancreatic                            duct(s) Diagnosis Code(s):        --- Professional ---                           J19.41, Encounter for fitting and adjustment of                            other gastrointestinal appliance and device                           K83.1, Obstruction of bile duct CPT copyright 2018 [employer]. All rights reserved. The codes documented in this report are preliminary and upon coder review may  be revised to meet current compliance requirements. Ronnette Juniper, MD 03/29/2018 9:26:27 AM This report has been signed electronically. Number of Addenda: 0

## 2018-03-30 ENCOUNTER — Inpatient Hospital Stay (HOSPITAL_COMMUNITY): Payer: Medicare Other | Admitting: Certified Registered Nurse Anesthetist

## 2018-03-30 ENCOUNTER — Encounter (HOSPITAL_COMMUNITY)
Admission: EM | Disposition: A | Discharge status: Home Health | Payer: Self-pay | Source: Home / Self Care | Attending: Family Medicine

## 2018-03-30 ENCOUNTER — Inpatient Hospital Stay (HOSPITAL_COMMUNITY): Payer: Medicare Other

## 2018-03-30 ENCOUNTER — Encounter (HOSPITAL_COMMUNITY): Payer: Self-pay | Admitting: Gastroenterology

## 2018-03-30 HISTORY — PX: SPHINCTEROTOMY: SHX5544

## 2018-03-30 HISTORY — PX: ERCP: SHX5425

## 2018-03-30 HISTORY — PX: BILIARY STENT PLACEMENT: SHX5538

## 2018-03-30 LAB — COMPREHENSIVE METABOLIC PANEL
ALK PHOS: 769 U/L — AB (ref 38–126)
ALT: 102 U/L — AB (ref 0–44)
AST: 116 U/L — ABNORMAL HIGH (ref 15–41)
Albumin: 2.3 g/dL — ABNORMAL LOW (ref 3.5–5.0)
Anion gap: 7 (ref 5–15)
BUN: 7 mg/dL — ABNORMAL LOW (ref 8–23)
CALCIUM: 8.4 mg/dL — AB (ref 8.9–10.3)
CO2: 24 mmol/L (ref 22–32)
CREATININE: 0.74 mg/dL (ref 0.61–1.24)
Chloride: 105 mmol/L (ref 98–111)
GFR calc non Af Amer: >60 mL/min (ref >60)
Glucose, Bld: 125 mg/dL — ABNORMAL HIGH (ref 70–99)
Potassium: 2.8 mmol/L — ABNORMAL LOW (ref 3.5–5.1)
Sodium: 136 mmol/L (ref 135–145)
Total Bilirubin: 8.5 mg/dL — ABNORMAL HIGH (ref 0.3–1.2)
Total Protein: 5.2 g/dL — ABNORMAL LOW (ref 6.5–8.1)

## 2018-03-30 LAB — GLUCOSE, CAPILLARY: Glucose-Capillary: 122 mg/dL — ABNORMAL HIGH (ref 70–99)

## 2018-03-30 LAB — CBC
HCT: 27 % — ABNORMAL LOW (ref 39.0–52.0)
Hemoglobin: 9 g/dL — ABNORMAL LOW (ref 13.0–17.0)
MCH: 31.6 pg (ref 26.0–34.0)
MCHC: 33.3 g/dL (ref 30.0–36.0)
MCV: 94.7 fL (ref 80.0–100.0)
NRBC: 0.3 % — AB (ref 0.0–0.2)
PLATELETS: 131 10*3/uL — AB (ref 150–400)
RBC: 2.85 MIL/uL — ABNORMAL LOW (ref 4.22–5.81)
RDW: 15.4 % (ref 11.5–15.5)
WBC: 7.5 10*3/uL (ref 4.0–10.5)

## 2018-03-30 LAB — POCT I-STAT 4, (NA,K, GLUC, HGB,HCT)
Glucose, Bld: 120 mg/dL — ABNORMAL HIGH (ref 70–99)
HCT: 27 % — ABNORMAL LOW (ref 39.0–52.0)
HEMOGLOBIN: 9.2 g/dL — AB (ref 13.0–17.0)
Potassium: 3.5 mmol/L (ref 3.5–5.1)
SODIUM: 137 mmol/L (ref 135–145)

## 2018-03-30 SURGERY — ERCP, WITH INTERVENTION IF INDICATED
Anesthesia: General

## 2018-03-30 MED ORDER — LACTATED RINGERS IV SOLN
INTRAVENOUS | Status: DC | PRN
  Administered 2018-03-30: 12:00:00 via INTRAVENOUS

## 2018-03-30 MED ORDER — PROPOFOL 10 MG/ML IV BOLUS
INTRAVENOUS | Status: DC | PRN
  Administered 2018-03-30: 90 mg via INTRAVENOUS

## 2018-03-30 MED ORDER — FENTANYL CITRATE (PF) 250 MCG/5ML IJ SOLN
INTRAMUSCULAR | Status: DC | PRN
  Administered 2018-03-30: 50 ug via INTRAVENOUS

## 2018-03-30 MED ORDER — ROCURONIUM BROMIDE 50 MG/5ML IV SOSY
PREFILLED_SYRINGE | INTRAVENOUS | Status: DC | PRN
  Administered 2018-03-30: 10 mg via INTRAVENOUS

## 2018-03-30 MED ORDER — CIPROFLOXACIN IN D5W 400 MG/200ML IV SOLN
INTRAVENOUS | Status: AC
  Filled 2018-03-30: qty 200

## 2018-03-30 MED ORDER — SODIUM CHLORIDE 0.9 % IV SOLN
INTRAVENOUS | Status: DC | PRN
  Administered 2018-03-30: 25 ug/min via INTRAVENOUS

## 2018-03-30 MED ORDER — POTASSIUM CHLORIDE CRYS ER 20 MEQ PO TBCR
40.0000 meq | EXTENDED_RELEASE_TABLET | Freq: Once | ORAL | Status: AC
  Administered 2018-03-30: 40 meq via ORAL
  Filled 2018-03-30: qty 2

## 2018-03-30 MED ORDER — LIDOCAINE 2% (20 MG/ML) 5 ML SYRINGE
INTRAMUSCULAR | Status: DC | PRN
  Administered 2018-03-30: 60 mg via INTRAVENOUS

## 2018-03-30 MED ORDER — ONDANSETRON HCL 4 MG/2ML IJ SOLN
INTRAMUSCULAR | Status: DC | PRN
  Administered 2018-03-30: 4 mg via INTRAVENOUS

## 2018-03-30 MED ORDER — SODIUM CHLORIDE 0.9 % IV SOLN: INTRAVENOUS | Status: DC

## 2018-03-30 MED ORDER — DEXMEDETOMIDINE HCL 200 MCG/2ML IV SOLN
INTRAVENOUS | Status: DC | PRN
  Administered 2018-03-30: 4 ug via INTRAVENOUS
  Administered 2018-03-30: 8 ug via INTRAVENOUS

## 2018-03-30 MED ORDER — POTASSIUM CHLORIDE 10 MEQ/100ML IV SOLN
10.0000 meq | INTRAVENOUS | Status: AC
  Administered 2018-03-30 (×2): 10 meq via INTRAVENOUS
  Filled 2018-03-30 (×2): qty 100

## 2018-03-30 MED ORDER — INDOMETHACIN 50 MG RE SUPP
RECTAL | Status: DC | PRN
  Administered 2018-03-30: 100 mg via RECTAL

## 2018-03-30 MED ORDER — GLUCAGON HCL RDNA (DIAGNOSTIC) 1 MG IJ SOLR
INTRAMUSCULAR | Status: AC
  Filled 2018-03-30: qty 2

## 2018-03-30 MED ORDER — IOPAMIDOL (ISOVUE-300) INJECTION 61%
INTRAVENOUS | Status: DC | PRN
  Administered 2018-03-30: 30 mL via INTRAVENOUS

## 2018-03-30 MED ORDER — SUGAMMADEX SODIUM 200 MG/2ML IV SOLN
INTRAVENOUS | Status: DC | PRN
  Administered 2018-03-30: 115.6 mg via INTRAVENOUS

## 2018-03-30 MED ORDER — INDOMETHACIN 50 MG RE SUPP
RECTAL | Status: AC
  Filled 2018-03-30: qty 2

## 2018-03-30 MED ORDER — IOPAMIDOL (ISOVUE-300) INJECTION 61%
INTRAVENOUS | Status: AC
  Filled 2018-03-30: qty 150

## 2018-03-30 NOTE — Progress Notes (Signed)
Jose [name] 11:24 AM  Subjective: Patient seen and examined and case discussed with my partner Dr. 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

## 2018-03-30 NOTE — Op Note (Signed)
[facility] Patient Name: Jose [name] Procedure Date : 03/30/2018 MRN: [id number] Attending MD: Clarene Essex , MD Date of Birth: [birth year] CSN: [id number] Age: 84 Admit Type: Inpatient Procedure:                ERCP Indications:              Ascending cholangitis abnormal liver test distal                            CBD stricture from pancreatitis Providers:                Clarene Essex, MD, Carlyn Reichert, RN, Elspeth Cho                            Tech., Technician, Gala Lewandowsky, CRNA Referring MD:              Medicines:                See the Anesthesia note for documentation of the                            administered medications, General Anesthesia Complications:            No immediate complications. Estimated Blood Loss:     Estimated blood loss: none. Procedure:                Pre-Anesthesia Assessment:                           - Prior to the procedure, a History and Physical                            was performed, and patient medications and                            allergies were reviewed. The patient's tolerance of                            previous anesthesia was also reviewed. The risks                            and benefits of the procedure and the sedation                            options and risks were discussed with the patient.                            All questions were answered, and informed consent                            was obtained. Prior Anticoagulants: The patient has                            taken no previous anticoagulant or antiplatelet  agents. ASA Grade Assessment: III - A patient with                            severe systemic disease. After reviewing the risks                            and benefits, the patient was deemed in                            satisfactory condition to undergo the procedure.                           After obtaining informed consent, the scope was            passed under direct vision. Throughout the                            procedure, the patient's blood pressure, pulse, and                            oxygen saturations were monitored continuously. The                            TJF-Q180V ([id number]) Olympus ERCP was introduced                            through the mouth, and used to inject contrast into                            and used to cannulate the bile duct. The ERCP was                            accomplished without difficulty. The patient                            tolerated the procedure well. Scope In: Scope Out: Findings:      The major papilla was some distance away from a few diverticulum. The       major papilla was edematous. The major papilla was erythematous. Deep       selective cannulation was readily obtained and the wire was advanced       into the intrahepatics and there was some sluggish biliary drainage and       we proceeded with a biliary sphincterotomy in the customary fashion       which was made with a Hydratome sphincterotome using ERBE electrocautery       until we had adequate biliary drainage which was mostly pus and could       get the fully bowed sphincterotome easily in and out of the duct. There       was no post-sphincterotomy bleeding. The biliary tree was swept with a       9-12 mm adjustable balloon starting at the upper third of the main bile       duct. Pus was swept from the duct. The 9 mm balloon passed readily       through the stricture  but the 12 did not and we proceeded with an       occlusion cholangiogram which confirmed the distal stricture and not       much drainage so we placed one 10 Fr by 4 cm covered metal stent with no       external flaps and no internal flaps was placed 3.5 cm into the common       bile duct. Pus flowed through the stent. The stent was in good position.       The introducer and wire were removed and the scope was removed and the       patient  tolerated the procedure well and there was no pancreatic duct       injection or wire advancement throughout the procedure Impression:               - The major papilla was some distance away from a                            few diverticulum.                           - The major papilla appeared edematous.                           - The major papilla appeared erythematous.                           - A biliary sphincterotomy was performed.                           - The biliary tree was swept and pus was found.                           - One covered metal stent was placed into the                            common bile duct. No pancreatic duct injection or                            wire advancement was done throughout the procedure Recommendation:           - Clear liquid diet for 6 hours. Slowly advance as                            tolerated                           - Continue present medications. Recheck labs in a.m.                           - Return to GI clinic in 2 weeks.                           - Telephone GI clinic if symptomatic PRN. Procedure Code(s):        --- Professional ---  02637, Endoscopic retrograde                            cholangiopancreatography (ERCP); with placement of                            endoscopic stent into biliary or pancreatic duct,                            including pre- and post-dilation and guide wire                            passage, when performed, including sphincterotomy,                            when performed, each stent Diagnosis Code(s):        --- Professional ---                           K83.09, Other cholangitis                           K83.8, Other specified diseases of biliary tract CPT copyright 2018 [employer] Association. All rights reserved. The codes documented in this report are preliminary and upon coder review may  be revised to meet current compliance requirements. Clarene Essex,  MD 03/30/2018 1:20:55 PM This report has been signed electronically. Number of Addenda: 0

## 2018-03-30 NOTE — Transfer of Care (Signed)
Immediate Anesthesia Transfer of Care Note  Patient: Jose [name]  Procedure(s) Performed: ENDOSCOPIC RETROGRADE CHOLANGIOPANCREATOGRAPHY (ERCP) (N/A )  Patient Location: Endo   Anesthesia Type:General  Level of Consciousness: pateint uncooperative and confused  Airway & Oxygen Therapy: Patient Spontanous Breathing  Post-op Assessment: Report given to RN, Post -op Vital signs reviewed and stable and Patient moving all extremities  Post vital signs: Reviewed and stable  Last Vitals:  Vitals Value Taken Time  BP    Temp    Pulse    Resp    SpO2      Last Pain:  Vitals:   03/30/18 1036  TempSrc: Oral  PainSc: 0-No pain         Complications: No apparent anesthesia complications

## 2018-03-30 NOTE — Progress Notes (Signed)
Patient Demographics:    Jose [name], is a 84 y.o. male, DOB - 1924/04/28, GNF:[id number]  Admit date - 03/27/2018   Admitting Physician Jose Bulls, MD  Outpatient Primary MD for the patient is Jose Carbon, MD  LOS - 2   Chief Complaint  Patient presents with  . Abnormal Lab  . Weakness        Subjective:    Jose [name] today has no fevers, no emesis,  No chest pain, family at bedside, npo for procedure  Assessment  & Plan :    Principal Problem:   Obstructive jaundice Active Problems:   Essential hypertension, benign   Anemia   Elevated liver enzymes   Pancreatitis due to biliary obstruction   History of ESBL E. coli infection   Acute pancreatitis   Hyperbilirubinemia   Brief summary:- 84 y.o.malesince today with complaints of epigastric discomfort and abnormal lab showing elevated liver enzymes, status post recent lap chole with ERCP that identified a stricture, status post biliary stenting on 03/12/2018, discharged on Levaquin on 03/17/2018.  Readmitted on 03/28/2018 with persistent abdominal pain and elevated LFTs with a T bili of 4.7, with CT abdomen and clinical exam consistent with acute pancreatitis  Plan:- 1)Abd Pain/obstructive jaundice/Distal CBD stricture secondary to pancreatitis ---  GI consult appreciated, patient underwent "incomplete"  ERCP on 03/29/2018 with retrieval of plastic stent, subsequent Repeat ERCP 03/30/18 with placement of covered biliary metal stent.   LFTs are very elevated raising concerns about possible ascending cholangitis , pulse was found in the biliary tree on ERCP on 03/30/2018 , patient has persistent obstructive jaundice , continue IV Rocephin for presumed cholangitis as noted above, blood cultures are NTD  2)HTN---  stable, continue metoprolol 25 mg twice daily ,   may use IV labetalol when necessary  Every 4 hours for systolic blood  pressure over 160 mmhg  3)Recent ESBL Bacteremia, patient was treated with Meropenem as inpatient at [facility] and discharged home on Levaquin which he completed on 03/26/2018, IV Rocephin as above in #1 for presumed cholangitis pending blood cultures  4)FEN--- clear liquid diet for now, dextrose half-normal saline solution at 50 mm an hour,   Disposition/Need for in-Hospital Stay- patient unable to be discharged at this time due to worsening LFTs and obstructive jaundice with concerns for cholangitis requiring  IV antibiotics pending blood culture results  Code Status : Full  Disposition Plan  : TBD  Procedures: ERCP by Dr. Therisa Doyne on 03/29/2018 with removal of plastic biliary stent ERCP by Dr. Watt Climes on 03/30/2018 with placement of covered biliary metal stent  Consults  :  Gi  DVT Prophylaxis  :  - SCDs   Lab Results  Component Value Date   PLT 131 (L) 03/30/2018    Inpatient Medications  Scheduled Meds: . metoprolol tartrate  25 mg Oral BID  . potassium chloride  40 mEq Oral Once   Continuous Infusions: . cefTRIAXone (ROCEPHIN)  IV    . dextrose 5 % and 0.45% NaCl 40 mL/hr at 03/30/18 0853  . famotidine (PEPCID) IV 20 mg (03/30/18 1450)   PRN Meds:.acetaminophen **OR** acetaminophen, labetalol, ondansetron **OR** ondansetron (ZOFRAN) IV    Anti-infectives (From admission, onward)   Start  Dose/Rate Route Frequency Ordered Stop   03/30/18 1600  cefTRIAXone (ROCEPHIN) 1 g in sodium chloride 0.9 % 100 mL IVPB     1 g 200 mL/hr over 30 Minutes Intravenous Every 24 hours 03/29/18 1430     03/29/18 1445  cefTRIAXone (ROCEPHIN) 2 g in sodium chloride 0.9 % 100 mL IVPB     2 g 200 mL/hr over 30 Minutes Intravenous  Once 03/29/18 1430 03/29/18 1618   03/29/18 1430  cefTRIAXone (ROCEPHIN) 2 g in sodium chloride 0.9 % 100 mL IVPB  Status:  Discontinued     2 g 200 mL/hr over 30 Minutes Intravenous Every 24 hours 03/29/18 1429 03/29/18 1430         Objective:   Vitals:   03/30/18 1400 03/30/18 1413 03/30/18 1419 03/30/18 1436  BP: 130/62 129/62 (!) 154/70 (!) 157/77  Pulse: 68  70 73  Resp: (!) 21 (!) 26 17 16   Temp:    98.4 F (36.9 C)  TempSrc:    Oral  SpO2: 97% 98% 97% 97%  Weight:        Wt Readings from Last 3 Encounters:  03/30/18 57.8 kg  03/24/18 57.6 kg  03/12/18 56.9 kg     Intake/Output Summary (Last 24 hours) at 03/30/2018 1611 Last data filed at 03/30/2018 1346 Gross per 24 hour  Intake 2085.13 ml  Output 730 ml  Net 1355.13 ml     Physical Exam Patient is examined daily including today on 03/30/18 , exams remain the same as of yesterday except that has changed   Gen:- Awake Alert, looks tired, does not look toxic HEENT:- [place].AT, No sclera icterus Neck-Supple Neck,No JVD,.  Lungs-  CTAB , fair symmetrical air movement CV- S1, S2 normal, regular  Abd-  +ve B.Sounds, Abd Soft, epigastric and right upper quadrant tenderness,   without rebound or guarding Extremity/Skin:- No  edema, pedal pulses present  Psych-affect is appropriate, oriented x3 Neuro-no new focal deficits, no tremors   Data Review:   Micro Results Recent Results (from the past 240 hour(s))  Culture, blood (routine x 2)     Status: None (Preliminary result)   Collection Time: 03/28/18 12:40 AM  Result Value Ref Range Status   Specimen Description BLOOD RIGHT ARM  Final   Special Requests   Final    BOTTLES DRAWN AEROBIC AND ANAEROBIC Blood Culture results may not be optimal due to an excessive volume of blood received in culture bottles   Culture   Final    NO GROWTH 2 DAYS Performed at [facility], [place] [address] [place], [place] [address]    Report Status PENDING  Incomplete  Culture, blood (routine x 2)     Status: None (Preliminary result)   Collection Time: 03/28/18  1:00 AM  Result Value Ref Range Status   Specimen Description BLOOD RIGHT HAND  Final   Special Requests   Final    BOTTLES DRAWN AEROBIC ONLY  Blood Culture results may not be optimal due to an excessive volume of blood received in culture bottles   Culture   Final    NO GROWTH 2 DAYS Performed at [facility], [place] [address], [place], [place] [address]    Report Status PENDING  Incomplete  MRSA PCR Screening     Status: None   Collection Time: 03/29/18  7:37 AM  Result Value Ref Range Status   MRSA by PCR NEGATIVE NEGATIVE Final    Comment:  The GeneXpert MRSA Assay (FDA approved for NASAL specimens only), is one component of a comprehensive MRSA colonization surveillance program. It is not intended to diagnose MRSA infection nor to guide or monitor treatment for MRSA infections. Performed at [facility], [place] [address], [place], [place] [address]   Culture, blood (Routine X 2) w Reflex to ID Panel     Status: None (Preliminary result)   Collection Time: 03/29/18  6:54 PM  Result Value Ref Range Status   Specimen Description BLOOD BLOOD LEFT HAND  Final   Special Requests   Final    BOTTLES DRAWN AEROBIC ONLY Blood Culture results may not be optimal due to an inadequate volume of blood received in culture bottles   Culture   Final    NO GROWTH < 24 HOURS Performed at [facility], [place] [address], [place], [place] [address]    Report Status PENDING  Incomplete    Radiology Reports Ct Abdomen Pelvis W Contrast  Result Date: 03/27/2018 CLINICAL DATA:  Acute onset of generalized weakness. Elevated bilirubin. EXAM: CT ABDOMEN AND PELVIS WITH CONTRAST TECHNIQUE: Multidetector CT imaging of the abdomen and pelvis was performed using the standard protocol following bolus administration of intravenous contrast. CONTRAST:  143mL OMNIPAQUE IOHEXOL 300 MG/ML  SOLN COMPARISON:  CT of the abdomen and pelvis performed 03/11/2018, and MRCP performed 03/12/2018 FINDINGS: Lower chest: Mild bibasilar airspace opacities likely reflect atelectasis. Scattered coronary artery calcifications are seen. Hepatobiliary:  There is diffuse dilatation of the intrahepatic biliary ducts and common bile duct, as previously noted, with a common bile duct stent seen in expected position. There is also diffuse dilatation of the pancreatic duct. The liver is otherwise grossly unremarkable in appearance. The patient is status post cholecystectomy, with clips noted at the gallbladder fossa. Pancreas: Diffuse soft tissue inflammation about the pancreas is compatible with pancreatitis, as previously noted, with slightly decreased enhancement noted at the uncinate process. No definite pseudocyst formation is noted. Spleen: The spleen is unremarkable in appearance. Adrenals/Urinary Tract: The adrenal glands are unremarkable in appearance. Small left renal cysts are noted. There is no evidence of hydronephrosis. No renal or ureteral stones are identified. No perinephric stranding is seen. Stomach/Bowel: The stomach is unremarkable in appearance. The small bowel is within normal limits. The appendix is normal in caliber, without evidence of appendicitis. The colon is unremarkable in appearance. Vascular/Lymphatic: Scattered calcification is seen along the abdominal aorta and its branches. The abdominal aorta is otherwise grossly unremarkable. The inferior vena cava is grossly unremarkable. No retroperitoneal lymphadenopathy is seen. No pelvic sidewall lymphadenopathy is identified. Reproductive: The bladder is mildly distended and grossly unremarkable. The prostate is borderline normal in size. Other: No additional soft tissue abnormalities are seen. Musculoskeletal: No acute osseous abnormalities are identified. Multilevel vacuum phenomenon is noted along the lower thoracic and lumbar spine, with mild underlying facet disease. The visualized musculature is unremarkable in appearance. IMPRESSION: 1. Diffuse soft tissue inflammation about the pancreas is compatible with acute pancreatitis, as previously noted, with slightly decreased enhancement noted  at the uncinate process. No definite pseudocyst formation seen. 2. Diffuse dilatation of the intrahepatic biliary ducts and common bile duct, as previously noted, with a common bile duct stent seen in expected position. Diffuse dilatation of the pancreatic duct. 3. Mild bibasilar airspace opacities likely reflect atelectasis. 4. Scattered coronary artery calcifications seen. 5. Small left renal cysts noted. 6. Mild degenerative change along the lower thoracic and lumbar spine. Aortic Atherosclerosis (ICD10-I70.0). Electronically  Signed   By: Garald Balding M.D.   On: 03/27/2018 23:24   Ct Abdomen Pelvis W Contrast  Result Date: 03/11/2018 CLINICAL DATA:  Increased weakness with generalized abdominal pain EXAM: CT ABDOMEN AND PELVIS WITH CONTRAST TECHNIQUE: Multidetector CT imaging of the abdomen and pelvis was performed using the standard protocol following bolus administration of intravenous contrast. CONTRAST:  41mL OMNIPAQUE IOHEXOL 300 MG/ML  SOLN COMPARISON:  01/04/2018, 12/27/2017 FINDINGS: Lower chest: Lung bases demonstrate hazy dependent atelectasis. Heart size within normal limits. Hepatobiliary: Intra and extrahepatic biliary dilatation status post cholecystectomy. Common bile duct diameter up to 13 mm. Pancreas: Diffuse ductal dilatation. Hazy indistinct appearance of pancreas consistent with pancreatitis. No organized fluid collection. Spleen: Normal in size without focal abnormality. Adrenals/Urinary Tract: Adrenal glands are normal. Scarring left kidney. Cyst mid left kidney. No hydronephrosis. The bladder is normal Stomach/Bowel: Stomach is nonenlarged. No dilated small bowel. No colon wall thickening. Sigmoid colon diverticula without acute inflammatory process. Mild retained feces at the rectum. Vascular/Lymphatic: Moderate aortic atherosclerosis. No aneurysm. No significantly enlarged lymph nodes. Reproductive: Prostate calcification with slight enlargement. Other: Negative for free air or  free fluid. Small fat in the left inguinal canal. Musculoskeletal: Moderate severe diffuse degenerative changes. Trace retrolisthesis L3 on L4, L2 on L3. No acute or suspicious abnormality. IMPRESSION: 1. Indistinct appearance of pancreas with mild surrounding edema/stranding, suspicious for an acute pancreatitis. No organized fluid collection. 2. Interval cholecystectomy with development of intra and extrahepatic biliary enlargement which may be correlated with LFTs. 3. Sigmoid colon diverticular disease without acute inflammatory process Electronically Signed   By: Donavan Foil M.D.   On: 03/11/2018 23:09   Mr Abdomen Mrcp Wo Contrast  Result Date: 03/12/2018 CLINICAL DATA:  Abdominal pain.  Suspected pancreatitis. EXAM: MRI ABDOMEN WITHOUT CONTRAST  (INCLUDING MRCP) TECHNIQUE: Multiplanar multisequence MR imaging of the abdomen was performed. Heavily T2-weighted images of the biliary and pancreatic ducts were obtained, and three-dimensional MRCP images were rendered by post processing. COMPARISON:  CT scan 03/11/2018 FINDINGS: Lower chest: The lung bases are clear of an acute process. No pleural or pericardial effusion. Hepatobiliary: Moderate intra and extrahepatic biliary dilatation. The common bile duct measures up to 13 mm in the porta hepatis but narrows quickly in the pancreatic head. No focal hepatic lesions. Gallbladder is surgically absent. Pancreas: The pancreas is enlarged and inflamed consistent with acute pancreatitis. Abrupt caliber change of the pancreatic duct in the pancreatic head likely due to inflammation. No masses identified. Spleen:  Normal size.  No focal lesions. Adrenals/Urinary Tract: The adrenal glands and kidneys are grossly normal. Small left renal cysts are noted. Stomach/Bowel: Visualized portions within the abdomen are unremarkable. Vascular/Lymphatic: No pathologically enlarged lymph nodes identified. No abdominal aortic aneurysm demonstrated. Other:  No ascites or  abdominal wall hernia. Musculoskeletal: No significant bony findings. IMPRESSION: 1. Enlarged and inflamed pancreas consistent with acute pancreatitis. 2. Intra and extrahepatic biliary dilatation with compression of the distal common bile duct in the head of the pancreas likely due to the surrounding pancreatitis. Similar findings involving the main pancreatic duct. 3. Status post cholecystectomy. 4. Simple appearing renal cysts. Electronically Signed   By: Marijo Sanes M.D.   On: 03/12/2018 19:48   Mr 3d Recon At Scanner  Result Date: 03/12/2018 CLINICAL DATA:  Abdominal pain.  Suspected pancreatitis. EXAM: MRI ABDOMEN WITHOUT CONTRAST  (INCLUDING MRCP) TECHNIQUE: Multiplanar multisequence MR imaging of the abdomen was performed. Heavily T2-weighted images of the biliary and pancreatic ducts were obtained, and three-dimensional  MRCP images were rendered by post processing. COMPARISON:  CT scan 03/11/2018 FINDINGS: Lower chest: The lung bases are clear of an acute process. No pleural or pericardial effusion. Hepatobiliary: Moderate intra and extrahepatic biliary dilatation. The common bile duct measures up to 13 mm in the porta hepatis but narrows quickly in the pancreatic head. No focal hepatic lesions. Gallbladder is surgically absent. Pancreas: The pancreas is enlarged and inflamed consistent with acute pancreatitis. Abrupt caliber change of the pancreatic duct in the pancreatic head likely due to inflammation. No masses identified. Spleen:  Normal size.  No focal lesions. Adrenals/Urinary Tract: The adrenal glands and kidneys are grossly normal. Small left renal cysts are noted. Stomach/Bowel: Visualized portions within the abdomen are unremarkable. Vascular/Lymphatic: No pathologically enlarged lymph nodes identified. No abdominal aortic aneurysm demonstrated. Other:  No ascites or abdominal wall hernia. Musculoskeletal: No significant bony findings. IMPRESSION: 1. Enlarged and inflamed pancreas  consistent with acute pancreatitis. 2. Intra and extrahepatic biliary dilatation with compression of the distal common bile duct in the head of the pancreas likely due to the surrounding pancreatitis. Similar findings involving the main pancreatic duct. 3. Status post cholecystectomy. 4. Simple appearing renal cysts. Electronically Signed   By: Marijo Sanes M.D.   On: 03/12/2018 19:48   Dg Chest Portable 1 View  Result Date: 03/11/2018 CLINICAL DATA:  Weakness 1 week. EXAM: PORTABLE CHEST 1 VIEW COMPARISON:  12/27/2017 FINDINGS: Lungs are adequately inflated without focal consolidation or effusion. Cardiomediastinal silhouette and remainder of the exam is unchanged. IMPRESSION: No active disease. Electronically Signed   By: Marin Olp M.D.   On: 03/11/2018 21:41   Dg Ercp  Result Date: 03/29/2018 CLINICAL DATA:  84 year old male with acute pancreatitis and biliary ductal dilatation. EXAM: ERCP TECHNIQUE: Multiple spot images obtained with the fluoroscopic device and submitted for interpretation post-procedure. FLUOROSCOPY TIME:  Fluoroscopy Time:  1 minutes 34 seconds reported COMPARISON:  None. FINDINGS: A single intraoperative saved image is submitted for review. The image demonstrates a flexible endoscope in the descending duodenum with partial wire cannulation. No cholangiogram images were submitted. Please see procedural note for further detail. IMPRESSION: ERCP as above.  Please see procedural note for further detail. These images were submitted for radiologic interpretation only. Please see the procedural report for the amount of contrast and the fluoroscopy time utilized. Electronically Signed   By: Jacqulynn Cadet M.D.   On: 03/29/2018 10:57   Dg Ercp Biliary & Pancreatic Ducts  Result Date: 03/30/2018 CLINICAL DATA:  ERCP with biliary stent placement. EXAM: ERCP TECHNIQUE: Multiple spot images obtained with the fluoroscopic device and submitted for interpretation post-procedure.  FLUOROSCOPY TIME:  292 seconds COMPARISON:  ERCP-03/29/2018; 03/13/2018; CT of the abdomen and pelvis-03/27/2018 FINDINGS: Five spot intraoperative fluoroscopic images during ERCP are provided for review. Initial image demonstrates an ERCP probe overlying the right upper abdominal quadrant. There is selective cannulation and opacification of the common bile duct which appears mild to moderately dilated. There is minimal opacification intrahepatic biliary tree which demonstrates apparent mild diffuse beaded irregularity. Subsequent images demonstrate insufflation of a balloon within the mid aspect of the CBD with subsequent presumed biliary sweeping and sphincterotomy. Despite this, there is a persistent relatively long segment narrowing involving the distal aspect of the CBD. Completion image demonstrates placement of an internal metallic biliary stent traversing the long segment narrowing of the CBD with passage of contrast to the level of the duodenum. IMPRESSION: ERCP with long segment severe narrowing of the distal aspect of the  CBD with subsequent internal metallic biliary stent placement. These images were submitted for radiologic interpretation only. Please see the procedural report for the amount of contrast and the fluoroscopy time utilized. Electronically Signed   By: Sandi Mariscal M.D.   On: 03/30/2018 13:51   Dg C-arm 1-60 Min-no Report  Result Date: 03/13/2018 Fluoroscopy was utilized by the requesting physician.  No radiographic interpretation.   US Abdomen Limited Ruq  Result Date: 03/12/2018 CLINICAL DATA:  84 year old with pancreatitis. Cholecystectomy last month. Elevated LFTs and bilirubin. EXAM: ULTRASOUND ABDOMEN LIMITED RIGHT UPPER QUADRANT COMPARISON:  Abdominal CT yesterday. FINDINGS: Gallbladder: Surgically absent.  No fluid collection in the gallbladder fossa. Common bile duct: Diameter: Dilated at 11 mm.  Distal CBD is obscured. Liver: Intrahepatic biliary ductal dilatation. No  focal lesion identified. Within normal limits in parenchymal echogenicity. Portal vein is patent on color Doppler imaging with normal direction of blood flow towards the liver. IMPRESSION: 1. Intra and extrahepatic biliary ductal dilatation, seen on CT. The distal common bile duct is obscured. No choledocholithiasis were visualized. 2. Postcholecystectomy. Electronically Signed   By: Keith Rake M.D.   On: 03/12/2018 01:22     CBC Recent Labs  Lab 03/26/18 1730 03/27/18 1854 03/28/18 0425 03/30/18 0150 03/30/18 1225  WBC 14.5* 9.6 7.9 7.5  --   HGB 11.8* 10.2* 9.0* 9.0* 9.2*  HCT 35.1* 33.1* 29.0* 27.0* 27.0*  PLT 199.0 177 165 131*  --   MCV 99.1 104.1* 99.7 94.7  --   MCH  --  32.1 30.9 31.6  --   MCHC 33.5 30.8 31.0 33.3  --   RDW 16.3* 15.6* 15.9* 15.4  --   LYMPHSABS  --  0.6* 0.9  --   --   MONOABS  --  1.0 1.0  --   --   EOSABS  --  0.4 0.2  --   --   BASOSABS  --  0.0 0.0  --   --     Chemistries  Recent Labs  Lab 03/26/18 1730 03/27/18 1854 03/28/18 0425 03/29/18 1058 03/30/18 0150 03/30/18 1225  NA 135 136 135  --  136 137  K 5.4* 4.7 4.4  --  2.8* 3.5  CL 102 106 108  --  105  --   CO2 24 20* 21*  --  24  --   GLUCOSE 130* 117* 84  --  125* 120*  BUN 21 23 18   --  7*  --   CREATININE 0.95 1.03 1.00  --  0.74  --   CALCIUM 9.6 9.2 8.6*  --  8.4*  --   MG  --  2.0  --   --   --   --   AST 197* 92* 105* 115* 116*  --   ALT 199* 151* 115* 114* 102*  --   ALKPHOS 691* 558* 489* 713* 769*  --   BILITOT 4.7* 4.7* 5.4* 8.6* 8.5*  --    ------------------------------------------------------------------------------------------------------------------ No results for input(s): CHOL, HDL, LDLCALC, TRIG, CHOLHDL, LDLDIRECT in the last 72 hours.  No results found for: HGBA1C ------------------------------------------------------------------------------------------------------------------ No results for input(s): TSH, T4TOTAL, T3FREE, THYROIDAB in the last 72  hours.  Invalid input(s): FREET3 ------------------------------------------------------------------------------------------------------------------ No results for input(s): VITAMINB12, FOLATE, FERRITIN, TIBC, IRON, RETICCTPCT in the last 72 hours.  Coagulation profile No results for input(s): INR, PROTIME in the last 168 hours.  No results for input(s): DDIMER in the last 72 hours.  Cardiac Enzymes No results for input(s): CKMB,  TROPONINI, MYOGLOBIN in the last 168 hours.  Invalid input(s): CK ------------------------------------------------------------------------------------------------------------------ No results found for: BNP   Roxan Hockey M.D on 03/30/2018 at 4:11 PM  Pager---[phone] Go to [email or web address] - password [email or web address] for contact info  [facility] - Office  [phone]

## 2018-03-30 NOTE — Telephone Encounter (Signed)
Pt family would like Debbie to know that they are taking Mr. Kopper to the emergency room. Pt feeling very weak

## 2018-03-30 NOTE — Anesthesia Procedure Notes (Signed)
Procedure Name: Intubation Date/Time: 03/30/2018 12:36 PM Performed by: Oletta Lamas, CRNA Pre-anesthesia Checklist: Patient identified, Emergency Drugs available, Suction available, Patient being monitored and Timeout performed Patient Re-evaluated:Patient Re-evaluated prior to induction Oxygen Delivery Method: Circle system utilized Preoxygenation: Pre-oxygenation with 100% oxygen Induction Type: IV induction Laryngoscope Size: Miller and 2 Grade View: Grade I Tube type: Oral Tube size: 7.5 mm Number of attempts: 1 Placement Confirmation: positive ETCO2,  ETT inserted through vocal cords under direct vision and breath sounds checked- equal and bilateral Secured at: 23 cm Tube secured with: Tape Dental Injury: Teeth and Oropharynx as per pre-operative assessment

## 2018-03-30 NOTE — Anesthesia Preprocedure Evaluation (Signed)
Anesthesia Evaluation  Patient identified by MRN, date of birth, ID band Patient awake    Reviewed: Allergy & Precautions, NPO status , Patient's Chart, lab work & pertinent test results  Airway Mallampati: II  TM Distance: >3 FB     Dental  (+) Dental Advisory Given, Missing   Pulmonary shortness of breath,    breath sounds clear to auscultation       Cardiovascular hypertension,  Rhythm:Regular Rate:Normal     Neuro/Psych    GI/Hepatic Neg liver ROS, GERD  ,History noted. CG   Endo/Other  negative endocrine ROS  Renal/GU negative Renal ROS     Musculoskeletal  (+) Arthritis ,   Abdominal   Peds  Hematology  (+) anemia ,   Anesthesia Other Findings   Reproductive/Obstetrics                             Anesthesia Physical Anesthesia Plan  ASA: III  Anesthesia Plan: General   Post-op Pain Management:    Induction: Intravenous  PONV Risk Score and Plan: Treatment may vary due to age or medical condition and Ondansetron  Airway Management Planned: Oral ETT  Additional Equipment:   Intra-op Plan:   Post-operative Plan: Possible Post-op intubation/ventilation  Informed Consent: I have reviewed the patients History and Physical, chart, labs and discussed the procedure including the risks, benefits and alternatives for the proposed anesthesia with the patient or authorized representative who has indicated his/her understanding and acceptance.   Dental advisory given  Plan Discussed with: CRNA and Anesthesiologist  Anesthesia Plan Comments:         Anesthesia Quick Evaluation

## 2018-03-30 NOTE — Anesthesia Postprocedure Evaluation (Signed)
Anesthesia Post Note  Patient: Jose [name]  Procedure(s) Performed: ENDOSCOPIC RETROGRADE CHOLANGIOPANCREATOGRAPHY (ERCP) (N/A ) REMOVAL OF STONES BILIARY STENT PLACEMENT     Patient location during evaluation: PACU Anesthesia Type: General Level of consciousness: awake Pain management: pain level controlled Vital Signs Assessment: post-procedure vital signs reviewed and stable Respiratory status: spontaneous breathing Cardiovascular status: stable Postop Assessment: no apparent nausea or vomiting Anesthetic complications: no    Last Vitals:  Vitals:   03/30/18 1419 03/30/18 1436  BP: (!) 154/70 (!) 157/77  Pulse: 70 73  Resp: 17 16  Temp:  36.9 C  SpO2: 97% 97%    Last Pain:  Vitals:   03/30/18 1436  TempSrc: Oral  PainSc:                  Akira Adelsberger

## 2018-03-31 LAB — CBC WITH DIFFERENTIAL/PLATELET
ABS IMMATURE GRANULOCYTES: 0.09 10*3/uL — AB (ref 0.00–0.07)
BASOS PCT: 0 %
Basophils Absolute: 0 10*3/uL (ref 0.0–0.1)
Eosinophils Absolute: 0.1 10*3/uL (ref 0.0–0.5)
Eosinophils Relative: 2 %
HCT: 28.2 % — ABNORMAL LOW (ref 39.0–52.0)
Hemoglobin: 8.9 g/dL — ABNORMAL LOW (ref 13.0–17.0)
IMMATURE GRANULOCYTES: 1 %
LYMPHS ABS: 0.7 10*3/uL (ref 0.7–4.0)
Lymphocytes Relative: 8 %
MCH: 30.9 pg (ref 26.0–34.0)
MCHC: 31.6 g/dL (ref 30.0–36.0)
MCV: 97.9 fL (ref 80.0–100.0)
Monocytes Absolute: 1.1 10*3/uL — ABNORMAL HIGH (ref 0.1–1.0)
Monocytes Relative: 12 %
NEUTROS ABS: 6.6 10*3/uL (ref 1.7–7.7)
NEUTROS PCT: 77 %
PLATELETS: 119 10*3/uL — AB (ref 150–400)
RBC: 2.88 MIL/uL — ABNORMAL LOW (ref 4.22–5.81)
RDW: 15.9 % — ABNORMAL HIGH (ref 11.5–15.5)
WBC: 8.6 10*3/uL (ref 4.0–10.5)
nRBC: 0 % (ref 0.0–0.2)

## 2018-03-31 LAB — COMPREHENSIVE METABOLIC PANEL
ALBUMIN: 2.2 g/dL — AB (ref 3.5–5.0)
ALT: 96 U/L — ABNORMAL HIGH (ref 0–44)
ANION GAP: 7 (ref 5–15)
AST: 102 U/L — ABNORMAL HIGH (ref 15–41)
Alkaline Phosphatase: 776 U/L — ABNORMAL HIGH (ref 38–126)
BUN: 7 mg/dL — ABNORMAL LOW (ref 8–23)
CHLORIDE: 107 mmol/L (ref 98–111)
CO2: 23 mmol/L (ref 22–32)
Calcium: 8.5 mg/dL — ABNORMAL LOW (ref 8.9–10.3)
Creatinine, Ser: 0.85 mg/dL (ref 0.61–1.24)
GFR calc Af Amer: >60 mL/min (ref >60)
GFR calc non Af Amer: >60 mL/min (ref >60)
Glucose, Bld: 118 mg/dL — ABNORMAL HIGH (ref 70–99)
POTASSIUM: 3.4 mmol/L — AB (ref 3.5–5.1)
Sodium: 137 mmol/L (ref 135–145)
TOTAL PROTEIN: 5.3 g/dL — AB (ref 6.5–8.1)
Total Bilirubin: 8.6 mg/dL — ABNORMAL HIGH (ref 0.3–1.2)

## 2018-03-31 LAB — GLUCOSE, CAPILLARY: Glucose-Capillary: 109 mg/dL — ABNORMAL HIGH (ref 70–99)

## 2018-03-31 MED ORDER — FAMOTIDINE 20 MG PO TABS
20.0000 mg | ORAL_TABLET | Freq: Two times a day (BID) | ORAL | Status: DC
  Administered 2018-03-31 – 2018-04-03 (×7): 20 mg via ORAL
  Filled 2018-03-31 (×7): qty 1

## 2018-03-31 MED ORDER — POTASSIUM CHLORIDE CRYS ER 20 MEQ PO TBCR
40.0000 meq | EXTENDED_RELEASE_TABLET | Freq: Once | ORAL | Status: AC
  Administered 2018-03-31: 40 meq via ORAL
  Filled 2018-03-31: qty 2

## 2018-03-31 MED ORDER — TAMSULOSIN HCL 0.4 MG PO CAPS
0.4000 mg | ORAL_CAPSULE | Freq: Every day | ORAL | Status: DC
  Administered 2018-03-31 – 2018-04-02 (×3): 0.4 mg via ORAL
  Filled 2018-03-31 (×3): qty 1

## 2018-03-31 MED ORDER — TRAMADOL HCL 50 MG PO TABS
50.0000 mg | ORAL_TABLET | Freq: Two times a day (BID) | ORAL | Status: DC | PRN
  Administered 2018-03-31 – 2018-04-03 (×3): 50 mg via ORAL
  Filled 2018-03-31 (×3): qty 1

## 2018-03-31 NOTE — Progress Notes (Signed)
Edd Worth [place] 10:29 AM  Subjective: Patient doing fine post ERCP without obvious GI complaints his main complaints are not liking the food wanting to go home and his feet are cold and his urine is yellow nurse's need  Objective: Vital signs stable afebrile no acute distress abdomen is soft nontender occasional bowel sounds bili still up other liver tests about the same   Assessment: This post ERCP for biliary stricture and cholangitis  Plan: Antibiotics and discharge planning per primary team and hopefully liver tests will decrease tomorrow and will see back as an outpatient in a few weeks after discharge repeat liver tests and decide the timing of the stent removal and will check on tomorrow  [facility]  Pager [phone] After 5PM or if no answer call [phone]

## 2018-03-31 NOTE — Progress Notes (Signed)
Patient Demographics:    Jose [name], is a 84 y.o. male, DOB - Sep 18, 1923, IDP:[id number]  Admit date - 03/27/2018   Admitting Physician Jose Bulls, MD  Outpatient Primary MD for the patient is Jose Carbon, MD  LOS - 3   Chief Complaint  Patient presents with  . Abnormal Lab  . Weakness        Subjective:    Jose [name] today has no fevers, no emesis,  No chest pain, grandson and daughter-in-law at bedside, questions answered, appetite is fair, denies significant abdominal pain  Assessment  & Plan :    Principal Problem:   Obstructive jaundice Active Problems:   Essential hypertension, benign   Anemia   Elevated liver enzymes   Pancreatitis due to biliary obstruction   History of ESBL E. coli infection   Acute pancreatitis   Hyperbilirubinemia   Brief Summary:- 84 y.o.malesince today with complaints of epigastric discomfort and abnormal lab showing elevated liver enzymes, status post recent lap chole with ERCP that identified a stricture, status post biliary stenting on 03/12/2018, discharged on Levaquin on 03/17/2018.  Readmitted on 03/28/2018 with persistent abdominal pain and elevated LFTs with a T bili of 4.7, with CT abdomen and clinical exam consistent with acute pancreatitis  Plan:- 1)Abd Pain/obstructive jaundice/Distal CBD stricture secondary to pancreatitis ---  GI consult appreciated, patient underwent "incomplete"  ERCP on 03/29/2018 with retrieval of plastic stent, subsequent Repeat ERCP 03/30/18 with placement of covered biliary metal stent.   AST and ALT trending down slightly, however bilirubin remains elevated and mostly unchanged at 8.6, alk phos is also elevated above 750 (mostly unchanged ), there is concern for possible ascending cholangitis , pus was found in the biliary tree on ERCP on 03/30/2018 , patient has persistent obstructive jaundice , continue IV  Rocephin started on 03/29/2018 for presumed cholangitis as noted above, blood cultures are NTD  2)HTN---  stable, continue metoprolol 25 mg twice daily ,   may use IV labetalol when necessary  Every 4 hours for systolic blood pressure over 160 mmhg  3)Recent ESBL Bacteremia, patient was treated with Meropenem as inpatient at [facility] and discharged home on Levaquin which he completed on 03/26/2018, continue IV Rocephin started on 03/29/2018 as above in #1 for presumed cholangitis pending blood cultures  4)FEN---  advance diet to GI soft diet, dextrose half-normal saline solution at 40 ml/hour until oral intake is more reliable,   5)BPH with LUTS--- Flomax restarted at patient request   6) acute on chronic anemia----suspect H&H is lower than baseline due to hemodilution from IV fluids and possible blood loss from procedures/phlebotomy/ blood draw given multiple stays in the hospital recently... Baseline hemoglobin usually above 10, hemoglobin currently around 9 but stable  Disposition/Need for in-Hospital Stay- patient unable to be discharged at this time due to persistently elevated LFTs and obstructive jaundice with concerns for cholangitis requiring  IV antibiotics pending blood culture results  Code Status : Full  Disposition Plan  : Possible discharge home with home health  Procedures: ERCP by Dr. Therisa Doyne on 03/29/2018 with removal of plastic biliary stent ERCP by Dr. Watt Climes on 03/30/2018 with placement of covered biliary metal stent  Consults  :  Gi  DVT Prophylaxis  :  -  SCDs   Lab Results  Component Value Date   PLT 119 (L) 03/31/2018    Inpatient Medications  Scheduled Meds: . famotidine  20 mg Oral BID  . metoprolol tartrate  25 mg Oral BID  . tamsulosin  0.4 mg Oral QPC supper   Continuous Infusions: . cefTRIAXone (ROCEPHIN)  IV 1 g (03/31/18 1518)  . dextrose 5 % and 0.45% NaCl 40 mL/hr at 03/31/18 1504   PRN Meds:.acetaminophen **OR**  acetaminophen, labetalol, ondansetron **OR** ondansetron (ZOFRAN) IV    Anti-infectives (From admission, onward)   Start     Dose/Rate Route Frequency Ordered Stop   03/30/18 1600  cefTRIAXone (ROCEPHIN) 1 g in sodium chloride 0.9 % 100 mL IVPB     1 g 200 mL/hr over 30 Minutes Intravenous Every 24 hours 03/29/18 1430     03/29/18 1445  cefTRIAXone (ROCEPHIN) 2 g in sodium chloride 0.9 % 100 mL IVPB     2 g 200 mL/hr over 30 Minutes Intravenous  Once 03/29/18 1430 03/29/18 1618   03/29/18 1430  cefTRIAXone (ROCEPHIN) 2 g in sodium chloride 0.9 % 100 mL IVPB  Status:  Discontinued     2 g 200 mL/hr over 30 Minutes Intravenous Every 24 hours 03/29/18 1429 03/29/18 1430        Objective:   Vitals:   03/30/18 2055 03/31/18 0500 03/31/18 0505 03/31/18 1345  BP: (!) 150/80  (!) 142/71 124/66  Pulse: 75  68 79  Resp: '19  18 16  '$ Temp: 98.6 F (37 C)  97.7 F (36.5 C) (!) 97.4 F (36.3 C)  TempSrc: Oral  Oral Oral  SpO2: 97%  99% 100%  Weight:  62.8 kg      Wt Readings from Last 3 Encounters:  03/31/18 62.8 kg  03/24/18 57.6 kg  03/12/18 56.9 kg     Intake/Output Summary (Last 24 hours) at 03/31/2018 1658 Last data filed at 03/31/2018 1504 Gross per 24 hour  Intake 1426.32 ml  Output 1 ml  Net 1425.32 ml     Physical Exam Patient is examined daily including today on 03/31/18 , exams remain the same as of yesterday except that has changed   Gen:- Awake Alert, cooperative, does not look toxic HEENT:- [place].AT,  +ve sclera icterus Neck-Supple Neck,No JVD,.  Lungs-  CTAB , fair symmetrical air movement CV- S1, S2 normal, regular  Abd-  +ve B.Sounds, Abd Soft, improving epigastric and right upper quadrant tenderness,  without rebound or guarding Extremity- No  edema, pedal pulses present  Psych-affect is appropriate, oriented x3 Neuro-no new focal deficits, no tremors Skin--- jaundiced  Data Review:   Micro Results Recent Results (from the past 240 hour(s))  Culture,  blood (routine x 2)     Status: None (Preliminary result)   Collection Time: 03/28/18 12:40 AM  Result Value Ref Range Status   Specimen Description BLOOD RIGHT ARM  Final   Special Requests   Final    BOTTLES DRAWN AEROBIC AND ANAEROBIC Blood Culture results may not be optimal due to an excessive volume of blood received in culture bottles   Culture   Final    NO GROWTH 3 DAYS Performed at [facility], [place] [address] [place], [place] [address]    Report Status PENDING  Incomplete  Culture, blood (routine x 2)     Status: None (Preliminary result)   Collection Time: 03/28/18  1:00 AM  Result Value Ref Range Status   Specimen Description BLOOD RIGHT HAND  Final   Special Requests   Final    BOTTLES DRAWN AEROBIC ONLY Blood Culture results may not be optimal due to an excessive volume of blood received in culture bottles   Culture   Final    NO GROWTH 3 DAYS Performed at [facility], [place] [address] [place], [place] [address]    Report Status PENDING  Incomplete  MRSA PCR Screening     Status: None   Collection Time: 03/29/18  7:37 AM  Result Value Ref Range Status   MRSA by PCR NEGATIVE NEGATIVE Final    Comment:        The GeneXpert MRSA Assay (FDA approved for NASAL specimens only), is one component of a comprehensive MRSA colonization surveillance program. It is not intended to diagnose MRSA infection nor to guide or monitor treatment for MRSA infections. Performed at [facility], [place] [address], [place], [place] [address]   Culture, blood (Routine X 2) w Reflex to ID Panel     Status: None (Preliminary result)   Collection Time: 03/29/18  6:54 PM  Result Value Ref Range Status   Specimen Description BLOOD BLOOD LEFT HAND  Final   Special Requests   Final    BOTTLES DRAWN AEROBIC ONLY Blood Culture results may not be optimal due to an inadequate volume of blood received in culture bottles   Culture   Final    NO GROWTH 2 DAYS Performed at [facility], [place] [address], [place], [place] [address]    Report Status PENDING  Incomplete    Radiology Reports Ct Abdomen Pelvis W Contrast  Result Date: 03/27/2018 CLINICAL DATA:  Acute onset of generalized weakness. Elevated bilirubin. EXAM: CT ABDOMEN AND PELVIS WITH CONTRAST TECHNIQUE: Multidetector CT imaging of the abdomen and pelvis was performed using the standard protocol following bolus administration of intravenous contrast. CONTRAST:  153m OMNIPAQUE IOHEXOL 300 MG/ML  SOLN COMPARISON:  CT of the abdomen and pelvis performed 03/11/2018, and MRCP performed 03/12/2018 FINDINGS: Lower chest: Mild bibasilar airspace opacities likely reflect atelectasis. Scattered coronary artery calcifications are seen. Hepatobiliary: There is diffuse dilatation of the intrahepatic biliary ducts and common bile duct, as previously noted, with a common bile duct stent seen in expected position. There is also diffuse dilatation of the pancreatic duct. The liver is otherwise grossly unremarkable in appearance. The patient is status post cholecystectomy, with clips noted at the gallbladder fossa. Pancreas: Diffuse soft tissue inflammation about the pancreas is compatible with pancreatitis, as previously noted, with slightly decreased enhancement noted at the uncinate process. No definite pseudocyst formation is noted. Spleen: The spleen is unremarkable in appearance. Adrenals/Urinary Tract: The adrenal glands are unremarkable in appearance. Small left renal cysts are noted. There is no evidence of hydronephrosis. No renal or ureteral stones are identified. No perinephric stranding is seen. Stomach/Bowel: The stomach is unremarkable in appearance. The small bowel is within normal limits. The appendix is normal in caliber, without evidence of appendicitis. The colon is unremarkable in appearance. Vascular/Lymphatic: Scattered calcification is seen along the abdominal aorta and its branches. The abdominal aorta is  otherwise grossly unremarkable. The inferior vena cava is grossly unremarkable. No retroperitoneal lymphadenopathy is seen. No pelvic sidewall lymphadenopathy is identified. Reproductive: The bladder is mildly distended and grossly unremarkable. The prostate is borderline normal in size. Other: No additional soft tissue abnormalities are seen. Musculoskeletal: No acute osseous abnormalities are identified. Multilevel vacuum phenomenon is noted along the lower thoracic and lumbar spine,  with mild underlying facet disease. The visualized musculature is unremarkable in appearance. IMPRESSION: 1. Diffuse soft tissue inflammation about the pancreas is compatible with acute pancreatitis, as previously noted, with slightly decreased enhancement noted at the uncinate process. No definite pseudocyst formation seen. 2. Diffuse dilatation of the intrahepatic biliary ducts and common bile duct, as previously noted, with a common bile duct stent seen in expected position. Diffuse dilatation of the pancreatic duct. 3. Mild bibasilar airspace opacities likely reflect atelectasis. 4. Scattered coronary artery calcifications seen. 5. Small left renal cysts noted. 6. Mild degenerative change along the lower thoracic and lumbar spine. Aortic Atherosclerosis (ICD10-I70.0). Electronically Signed   By: Garald Balding M.D.   On: 03/27/2018 23:24   Ct Abdomen Pelvis W Contrast  Result Date: 03/11/2018 CLINICAL DATA:  Increased weakness with generalized abdominal pain EXAM: CT ABDOMEN AND PELVIS WITH CONTRAST TECHNIQUE: Multidetector CT imaging of the abdomen and pelvis was performed using the standard protocol following bolus administration of intravenous contrast. CONTRAST:  6m OMNIPAQUE IOHEXOL 300 MG/ML  SOLN COMPARISON:  01/04/2018, 12/27/2017 FINDINGS: Lower chest: Lung bases demonstrate hazy dependent atelectasis. Heart size within normal limits. Hepatobiliary: Intra and extrahepatic biliary dilatation status post  cholecystectomy. Common bile duct diameter up to 13 mm. Pancreas: Diffuse ductal dilatation. Hazy indistinct appearance of pancreas consistent with pancreatitis. No organized fluid collection. Spleen: Normal in size without focal abnormality. Adrenals/Urinary Tract: Adrenal glands are normal. Scarring left kidney. Cyst mid left kidney. No hydronephrosis. The bladder is normal Stomach/Bowel: Stomach is nonenlarged. No dilated small bowel. No colon wall thickening. Sigmoid colon diverticula without acute inflammatory process. Mild retained feces at the rectum. Vascular/Lymphatic: Moderate aortic atherosclerosis. No aneurysm. No significantly enlarged lymph nodes. Reproductive: Prostate calcification with slight enlargement. Other: Negative for free air or free fluid. Small fat in the left inguinal canal. Musculoskeletal: Moderate severe diffuse degenerative changes. Trace retrolisthesis L3 on L4, L2 on L3. No acute or suspicious abnormality. IMPRESSION: 1. Indistinct appearance of pancreas with mild surrounding edema/stranding, suspicious for an acute pancreatitis. No organized fluid collection. 2. Interval cholecystectomy with development of intra and extrahepatic biliary enlargement which may be correlated with LFTs. 3. Sigmoid colon diverticular disease without acute inflammatory process Electronically Signed   By: KDonavan FoilM.D.   On: 03/11/2018 23:09   Mr Abdomen Mrcp Wo Contrast  Result Date: 03/12/2018 CLINICAL DATA:  Abdominal pain.  Suspected pancreatitis. EXAM: MRI ABDOMEN WITHOUT CONTRAST  (INCLUDING MRCP) TECHNIQUE: Multiplanar multisequence MR imaging of the abdomen was performed. Heavily T2-weighted images of the biliary and pancreatic ducts were obtained, and three-dimensional MRCP images were rendered by post processing. COMPARISON:  CT scan 03/11/2018 FINDINGS: Lower chest: The lung bases are clear of an acute process. No pleural or pericardial effusion. Hepatobiliary: Moderate intra and  extrahepatic biliary dilatation. The common bile duct measures up to 13 mm in the porta hepatis but narrows quickly in the pancreatic head. No focal hepatic lesions. Gallbladder is surgically absent. Pancreas: The pancreas is enlarged and inflamed consistent with acute pancreatitis. Abrupt caliber change of the pancreatic duct in the pancreatic head likely due to inflammation. No masses identified. Spleen:  Normal size.  No focal lesions. Adrenals/Urinary Tract: The adrenal glands and kidneys are grossly normal. Small left renal cysts are noted. Stomach/Bowel: Visualized portions within the abdomen are unremarkable. Vascular/Lymphatic: No pathologically enlarged lymph nodes identified. No abdominal aortic aneurysm demonstrated. Other:  No ascites or abdominal wall hernia. Musculoskeletal: No significant bony findings. IMPRESSION: 1. Enlarged and inflamed pancreas consistent  with acute pancreatitis. 2. Intra and extrahepatic biliary dilatation with compression of the distal common bile duct in the head of the pancreas likely due to the surrounding pancreatitis. Similar findings involving the main pancreatic duct. 3. Status post cholecystectomy. 4. Simple appearing renal cysts. Electronically Signed   By: Marijo Sanes M.D.   On: 03/12/2018 19:48   Mr 3d Recon At Scanner  Result Date: 03/12/2018 CLINICAL DATA:  Abdominal pain.  Suspected pancreatitis. EXAM: MRI ABDOMEN WITHOUT CONTRAST  (INCLUDING MRCP) TECHNIQUE: Multiplanar multisequence MR imaging of the abdomen was performed. Heavily T2-weighted images of the biliary and pancreatic ducts were obtained, and three-dimensional MRCP images were rendered by post processing. COMPARISON:  CT scan 03/11/2018 FINDINGS: Lower chest: The lung bases are clear of an acute process. No pleural or pericardial effusion. Hepatobiliary: Moderate intra and extrahepatic biliary dilatation. The common bile duct measures up to 13 mm in the porta hepatis but narrows quickly in the  pancreatic head. No focal hepatic lesions. Gallbladder is surgically absent. Pancreas: The pancreas is enlarged and inflamed consistent with acute pancreatitis. Abrupt caliber change of the pancreatic duct in the pancreatic head likely due to inflammation. No masses identified. Spleen:  Normal size.  No focal lesions. Adrenals/Urinary Tract: The adrenal glands and kidneys are grossly normal. Small left renal cysts are noted. Stomach/Bowel: Visualized portions within the abdomen are unremarkable. Vascular/Lymphatic: No pathologically enlarged lymph nodes identified. No abdominal aortic aneurysm demonstrated. Other:  No ascites or abdominal wall hernia. Musculoskeletal: No significant bony findings. IMPRESSION: 1. Enlarged and inflamed pancreas consistent with acute pancreatitis. 2. Intra and extrahepatic biliary dilatation with compression of the distal common bile duct in the head of the pancreas likely due to the surrounding pancreatitis. Similar findings involving the main pancreatic duct. 3. Status post cholecystectomy. 4. Simple appearing renal cysts. Electronically Signed   By: Marijo Sanes M.D.   On: 03/12/2018 19:48   Dg Chest Portable 1 View  Result Date: 03/11/2018 CLINICAL DATA:  Weakness 1 week. EXAM: PORTABLE CHEST 1 VIEW COMPARISON:  12/27/2017 FINDINGS: Lungs are adequately inflated without focal consolidation or effusion. Cardiomediastinal silhouette and remainder of the exam is unchanged. IMPRESSION: No active disease. Electronically Signed   By: Marin Olp M.D.   On: 03/11/2018 21:41   Dg Ercp  Result Date: 03/29/2018 CLINICAL DATA:  84 year old male with acute pancreatitis and biliary ductal dilatation. EXAM: ERCP TECHNIQUE: Multiple spot images obtained with the fluoroscopic device and submitted for interpretation post-procedure. FLUOROSCOPY TIME:  Fluoroscopy Time:  1 minutes 34 seconds reported COMPARISON:  None. FINDINGS: A single intraoperative saved image is submitted for  review. The image demonstrates a flexible endoscope in the descending duodenum with partial wire cannulation. No cholangiogram images were submitted. Please see procedural note for further detail. IMPRESSION: ERCP as above.  Please see procedural note for further detail. These images were submitted for radiologic interpretation only. Please see the procedural report for the amount of contrast and the fluoroscopy time utilized. Electronically Signed   By: Jacqulynn Cadet M.D.   On: 03/29/2018 10:57   Dg Ercp Biliary & Pancreatic Ducts  Result Date: 03/30/2018 CLINICAL DATA:  ERCP with biliary stent placement. EXAM: ERCP TECHNIQUE: Multiple spot images obtained with the fluoroscopic device and submitted for interpretation post-procedure. FLUOROSCOPY TIME:  292 seconds COMPARISON:  ERCP-03/29/2018; 03/13/2018; CT of the abdomen and pelvis-03/27/2018 FINDINGS: Five spot intraoperative fluoroscopic images during ERCP are provided for review. Initial image demonstrates an ERCP probe overlying the right upper abdominal quadrant. There  is selective cannulation and opacification of the common bile duct which appears mild to moderately dilated. There is minimal opacification intrahepatic biliary tree which demonstrates apparent mild diffuse beaded irregularity. Subsequent images demonstrate insufflation of a balloon within the mid aspect of the CBD with subsequent presumed biliary sweeping and sphincterotomy. Despite this, there is a persistent relatively long segment narrowing involving the distal aspect of the CBD. Completion image demonstrates placement of an internal metallic biliary stent traversing the long segment narrowing of the CBD with passage of contrast to the level of the duodenum. IMPRESSION: ERCP with long segment severe narrowing of the distal aspect of the CBD with subsequent internal metallic biliary stent placement. These images were submitted for radiologic interpretation only. Please see the  procedural report for the amount of contrast and the fluoroscopy time utilized. Electronically Signed   By: Sandi Mariscal M.D.   On: 03/30/2018 13:51   Dg C-arm 1-60 Min-no Report  Result Date: 03/13/2018 Fluoroscopy was utilized by the requesting physician.  No radiographic interpretation.   US Abdomen Limited Ruq  Result Date: 03/12/2018 CLINICAL DATA:  84 year old with pancreatitis. Cholecystectomy last month. Elevated LFTs and bilirubin. EXAM: ULTRASOUND ABDOMEN LIMITED RIGHT UPPER QUADRANT COMPARISON:  Abdominal CT yesterday. FINDINGS: Gallbladder: Surgically absent.  No fluid collection in the gallbladder fossa. Common bile duct: Diameter: Dilated at 11 mm.  Distal CBD is obscured. Liver: Intrahepatic biliary ductal dilatation. No focal lesion identified. Within normal limits in parenchymal echogenicity. Portal vein is patent on color Doppler imaging with normal direction of blood flow towards the liver. IMPRESSION: 1. Intra and extrahepatic biliary ductal dilatation, seen on CT. The distal common bile duct is obscured. No choledocholithiasis were visualized. 2. Postcholecystectomy. Electronically Signed   By: Keith Rake M.D.   On: 03/12/2018 01:22     CBC Recent Labs  Lab 03/26/18 1730 03/27/18 1854 03/28/18 0425 03/30/18 0150 03/30/18 1225 03/31/18 0341  WBC 14.5* 9.6 7.9 7.5  --  8.6  HGB 11.8* 10.2* 9.0* 9.0* 9.2* 8.9*  HCT 35.1* 33.1* 29.0* 27.0* 27.0* 28.2*  PLT 199.0 177 165 131*  --  119*  MCV 99.1 104.1* 99.7 94.7  --  97.9  MCH  --  32.1 30.9 31.6  --  30.9  MCHC 33.5 30.8 31.0 33.3  --  31.6  RDW 16.3* 15.6* 15.9* 15.4  --  15.9*  LYMPHSABS  --  0.6* 0.9  --   --  0.7  MONOABS  --  1.0 1.0  --   --  1.1*  EOSABS  --  0.4 0.2  --   --  0.1  BASOSABS  --  0.0 0.0  --   --  0.0    Chemistries  Recent Labs  Lab 03/26/18 1730 03/27/18 1854 03/28/18 0425 03/29/18 1058 03/30/18 0150 03/30/18 1225 03/31/18 0341  NA 135 136 135  --  136 137 137  K 5.4* 4.7  4.4  --  2.8* 3.5 3.4*  CL 102 106 108  --  105  --  107  CO2 24 20* 21*  --  24  --  23  GLUCOSE 130* 117* 84  --  125* 120* 118*  BUN '21 23 18  '$ --  7*  --  7*  CREATININE 0.95 1.03 1.00  --  0.74  --  0.85  CALCIUM 9.6 9.2 8.6*  --  8.4*  --  8.5*  MG  --  2.0  --   --   --   --   --  AST 197* 92* 105* 115* 116*  --  102*  ALT 199* 151* 115* 114* 102*  --  96*  ALKPHOS 691* 558* 489* 713* 769*  --  776*  BILITOT 4.7* 4.7* 5.4* 8.6* 8.5*  --  8.6*   ------------------------------------------------------------------------------------------------------------------ No results for input(s): CHOL, HDL, LDLCALC, TRIG, CHOLHDL, LDLDIRECT in the last 72 hours.  No results found for: HGBA1C ------------------------------------------------------------------------------------------------------------------ No results for input(s): TSH, T4TOTAL, T3FREE, THYROIDAB in the last 72 hours.  Invalid input(s): FREET3 ------------------------------------------------------------------------------------------------------------------ No results for input(s): VITAMINB12, FOLATE, FERRITIN, TIBC, IRON, RETICCTPCT in the last 72 hours.  Coagulation profile No results for input(s): INR, PROTIME in the last 168 hours.  No results for input(s): DDIMER in the last 72 hours.  Cardiac Enzymes No results for input(s): CKMB, TROPONINI, MYOGLOBIN in the last 168 hours.  Invalid input(s): CK ------------------------------------------------------------------------------------------------------------------ No results found for: BNP   Roxan Hockey M.D on 03/31/2018 at 4:58 PM  Pager---[phone] Go to [email or web address] - password [email or web address] for contact info  [facility] - Office  [phone]

## 2018-03-31 NOTE — Care Management Note (Signed)
Case Management Note  Patient Details  Name: Jose [name] MRN: [id number] Date of Birth: 04-30-1924  Subjective/Objective:                    Action/Plan: Prior to admission, from home active with home health RN through [facility]. Will continue to follow.  Expected Discharge Date:                  Expected Discharge Plan:     In-House Referral:     Discharge planning Services  CM Consult  Post Acute Care Choice:    Choice offered to:     DME Arranged:    DME Agency:     HH Arranged:    HH Agency:     Status of Service:  In process, will continue to follow  If discussed at Long Length of Stay Meetings, dates discussed:    Additional Comments:  Marilu Favre, RN 03/31/2018, 10:32 AM

## 2018-04-01 DIAGNOSIS — I10 Essential (primary) hypertension: Secondary | ICD-10-CM

## 2018-04-01 DIAGNOSIS — D649 Anemia, unspecified: Secondary | ICD-10-CM

## 2018-04-01 DIAGNOSIS — K831 Obstruction of bile duct: Secondary | ICD-10-CM

## 2018-04-01 LAB — CBC
HCT: 27.3 % — ABNORMAL LOW (ref 39.0–52.0)
HEMOGLOBIN: 8.6 g/dL — AB (ref 13.0–17.0)
MCH: 30.7 pg (ref 26.0–34.0)
MCHC: 31.5 g/dL (ref 30.0–36.0)
MCV: 97.5 fL (ref 80.0–100.0)
Platelets: 133 10*3/uL — ABNORMAL LOW (ref 150–400)
RBC: 2.8 MIL/uL — ABNORMAL LOW (ref 4.22–5.81)
RDW: 16.3 % — ABNORMAL HIGH (ref 11.5–15.5)
WBC: 7.8 10*3/uL (ref 4.0–10.5)
nRBC: 0 % (ref 0.0–0.2)

## 2018-04-01 LAB — COMPREHENSIVE METABOLIC PANEL
ALK PHOS: 763 U/L — AB (ref 38–126)
ALT: 77 U/L — ABNORMAL HIGH (ref 0–44)
ANION GAP: 7 (ref 5–15)
AST: 72 U/L — ABNORMAL HIGH (ref 15–41)
Albumin: 2.1 g/dL — ABNORMAL LOW (ref 3.5–5.0)
BUN: 9 mg/dL (ref 8–23)
CALCIUM: 8.3 mg/dL — AB (ref 8.9–10.3)
CO2: 22 mmol/L (ref 22–32)
Chloride: 105 mmol/L (ref 98–111)
Creatinine, Ser: 0.8 mg/dL (ref 0.61–1.24)
GFR calc non Af Amer: >60 mL/min (ref >60)
GLUCOSE: 124 mg/dL — AB (ref 70–99)
Potassium: 3.4 mmol/L — ABNORMAL LOW (ref 3.5–5.1)
SODIUM: 134 mmol/L — AB (ref 135–145)
TOTAL PROTEIN: 5.3 g/dL — AB (ref 6.5–8.1)
Total Bilirubin: 5.6 mg/dL — ABNORMAL HIGH (ref 0.3–1.2)

## 2018-04-01 LAB — MAGNESIUM: MAGNESIUM: 1.6 mg/dL — AB (ref 1.7–2.4)

## 2018-04-01 LAB — GLUCOSE, CAPILLARY: Glucose-Capillary: 114 mg/dL — ABNORMAL HIGH (ref 70–99)

## 2018-04-01 MED ORDER — MAGNESIUM SULFATE 2 GM/50ML IV SOLN
2.0000 g | Freq: Once | INTRAVENOUS | Status: AC
  Administered 2018-04-01: 2 g via INTRAVENOUS
  Filled 2018-04-01: qty 50

## 2018-04-01 MED ORDER — FERROUS SULFATE 325 (65 FE) MG PO TABS
325.0000 mg | ORAL_TABLET | Freq: Every day | ORAL | Status: DC
  Administered 2018-04-02 – 2018-04-03 (×2): 325 mg via ORAL
  Filled 2018-04-01 (×2): qty 1

## 2018-04-01 MED ORDER — POTASSIUM CHLORIDE CRYS ER 20 MEQ PO TBCR
40.0000 meq | EXTENDED_RELEASE_TABLET | Freq: Once | ORAL | Status: AC
  Administered 2018-04-01: 40 meq via ORAL
  Filled 2018-04-01: qty 2

## 2018-04-01 MED ORDER — AMOXICILLIN-POT CLAVULANATE 875-125 MG PO TABS
1.0000 | ORAL_TABLET | Freq: Two times a day (BID) | ORAL | Status: DC
  Administered 2018-04-01 – 2018-04-02 (×4): 1 via ORAL
  Filled 2018-04-01 (×5): qty 1

## 2018-04-01 NOTE — Progress Notes (Signed)
Patient Demographics:    Luke Falero, is a 84 y.o. male, DOB - 06/03/1923, HFW:[id number]  Admit date - 03/27/2018   Admitting Physician [name], MD  Outpatient Primary MD for the patient is [name], MD  LOS - 4   Chief Complaint  Patient presents with  . Abnormal Lab  . Weakness        Subjective:   Patient denies any abdominal pain.  No nausea or vomiting.  Asking when he can go home.   Assessment  & Plan :    Principal Problem:   Obstructive jaundice Active Problems:   Essential hypertension, benign   Anemia   Elevated liver enzymes   Pancreatitis due to biliary obstruction   History of ESBL E. coli infection   Acute pancreatitis   Hyperbilirubinemia   Brief Summary: 84 y.o.malepresented with complaints of epigastric discomfort and abnormal lab showing elevated liver enzymes, status post recent lap chole with ERCP that identified a stricture, status post biliary stenting on 03/12/2018, discharged on Levaquin on 03/17/2018.  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 Patient underwent "incomplete"  ERCP on 03/29/2018 with retrieval of plastic stent. Subsequent Repeat ERCP 03/30/18 with placement of covered biliary metal stent.  LFTs show improvement but still remain elevated.  Plan is to repeat blood work tomorrow.  From a symptom standpoint patient appears to have improved.  There was concern for possible ascending cholangitis as possible as noted in the biliary tree on ERCP.  Patient was started on ceftriaxone.  Cultures are negative so far.  Change to Augmentin today.   Essential hypertension  Stable.  Continue home  medications.   Recent ESBL Bacteremia Patient was treated with Meropenem as inpatient at [facility] and discharged home on Levaquin which he completed on 03/26/2018.  Repeat cultures negative so far.  BPH with LUTS Continue Flomax.   Normocytic anemia  Drop in hemoglobin is likely dilutional.  No evidence of overt bleeding.  Noted to be on iron supplements at home which can be resumed.  Hypokalemia and hypomagnesemia This will be repleted   Code Status : Full Disposition Plan : Hopefully home with home health when ready for discharge.  PT and OT evaluation  Procedures: ERCP by Dr. Therisa Doyne on 03/29/2018 with removal of plastic biliary stent ERCP by Dr. Watt Climes on 03/30/2018 with placement of covered biliary metal stent  Consults  :  Gi  DVT Prophylaxis  :   SCDs   Inpatient Medications  Scheduled Meds: . famotidine  20 mg Oral BID  . [START ON 04/02/2018] ferrous sulfate  325 mg Oral Q breakfast  . metoprolol tartrate  25 mg Oral BID  . tamsulosin  0.4 mg Oral QPC supper   Continuous Infusions: . cefTRIAXone (ROCEPHIN)  IV Stopped (03/31/18 1552)  . dextrose 5 % and 0.45% NaCl 40 mL/hr at 04/01/18 0600   PRN Meds:.acetaminophen **OR** acetaminophen, labetalol, ondansetron **OR** ondansetron (ZOFRAN) IV, traMADol    Anti-infectives (From admission, onward)   Start     Dose/Rate Route Frequency Ordered  Stop   03/30/18 1600  cefTRIAXone (ROCEPHIN) 1 g in sodium chloride 0.9 % 100 mL IVPB     1 g 200 mL/hr over 30 Minutes Intravenous Every 24 hours 03/29/18 1430     03/29/18 1445  cefTRIAXone (ROCEPHIN) 2 g in sodium chloride 0.9 % 100 mL IVPB     2 g 200 mL/hr over 30 Minutes Intravenous  Once 03/29/18 1430 03/29/18 1618   03/29/18 1430  cefTRIAXone (ROCEPHIN) 2 g in sodium chloride 0.9 % 100 mL IVPB  Status:  Discontinued     2 g 200 mL/hr over 30 Minutes Intravenous Every 24 hours 03/29/18 1429 03/29/18 1430        Objective:   Vitals:   03/31/18  1345 03/31/18 2040 04/01/18 0500 04/01/18 0648  BP: 124/66 131/68  130/70  Pulse: 79 90  79  Resp: 16 16  18   Temp: (!) 97.4 F (36.3 C) 97.6 F (36.4 C)  (!) 97.4 F (36.3 C)  TempSrc: Oral Oral  Oral  SpO2: 100% 98%  100%  Weight:   63.4 kg     Wt Readings from Last 3 Encounters:  04/01/18 63.4 kg  03/24/18 57.6 kg  03/12/18 56.9 kg     Intake/Output Summary (Last 24 hours) at 04/01/2018 1318 Last data filed at 04/01/2018 1300 Gross per 24 hour  Intake 1237.49 ml  Output -  Net 1237.49 ml     Physical Exam  Patient is awake alert.  In no distress Lungs are clear to auscultation bilaterally.  Normal effort S1-S2 is normal regular.  Systolic murmur appreciated over the precordium Abdomen soft.  Nontender nondistended.  No masses organomegaly.  Bowel sounds are present normal He is awake alert.  No focal neurological deficits.    Data Review:   Micro Results Recent Results (from the past 240 hour(s))  Culture, blood (routine x 2)     Status: None (Preliminary result)   Collection Time: 03/28/18 12:40 AM  Result Value Ref Range Status   Specimen Description BLOOD RIGHT ARM  Final   Special Requests   Final    BOTTLES DRAWN AEROBIC AND ANAEROBIC Blood Culture results may not be optimal due to an excessive volume of blood received in culture bottles   Culture   Final    NO GROWTH 4 DAYS Performed at [facility] [address], [place], [place] [address]    Report Status PENDING  Incomplete  Culture, blood (routine x 2)     Status: None (Preliminary result)   Collection Time: 03/28/18  1:00 AM  Result Value Ref Range Status   Specimen Description BLOOD RIGHT HAND  Final   Special Requests   Final    BOTTLES DRAWN AEROBIC ONLY Blood Culture results may not be optimal due to an excessive volume of blood received in culture bottles   Culture   Final    NO GROWTH 4 DAYS Performed at [facility], [place] [address] [place], [place] [address]    Report  Status PENDING  Incomplete  MRSA PCR Screening     Status: None   Collection Time: 03/29/18  7:37 AM  Result Value Ref Range Status   MRSA by PCR NEGATIVE NEGATIVE Final    Comment:        The GeneXpert MRSA Assay (FDA approved for NASAL specimens only), is one component of a comprehensive MRSA colonization surveillance program. It is not intended to diagnose MRSA infection nor to guide or monitor  treatment for MRSA infections. Performed at [facility], [place] [address] [place], [place] [address]   Culture, blood (Routine X 2) w Reflex to ID Panel     Status: None (Preliminary result)   Collection Time: 03/29/18  6:54 PM  Result Value Ref Range Status   Specimen Description BLOOD BLOOD LEFT HAND  Final   Special Requests   Final    BOTTLES DRAWN AEROBIC ONLY Blood Culture results may not be optimal due to an inadequate volume of blood received in culture bottles   Culture   Final    NO GROWTH 3 DAYS Performed at [facility], [place] [address] [place], [place] [address]    Report Status PENDING  Incomplete    Radiology Reports Ct Abdomen Pelvis W Contrast  Result Date: 03/27/2018 CLINICAL DATA:  Acute onset of generalized weakness. Elevated bilirubin. EXAM: CT ABDOMEN AND PELVIS WITH CONTRAST TECHNIQUE: Multidetector CT imaging of the abdomen and pelvis was performed using the standard protocol following bolus administration of intravenous contrast. CONTRAST:  114mL OMNIPAQUE IOHEXOL 300 MG/ML  SOLN COMPARISON:  CT of the abdomen and pelvis performed 03/11/2018, and MRCP performed 03/12/2018 FINDINGS: Lower chest: Mild bibasilar airspace opacities likely reflect atelectasis. Scattered coronary artery calcifications are seen. Hepatobiliary: There is diffuse dilatation of the intrahepatic biliary ducts and common bile duct, as previously noted, with a common bile duct stent seen in expected position. There is also diffuse dilatation of the pancreatic duct. The liver is  otherwise grossly unremarkable in appearance. The patient is status post cholecystectomy, with clips noted at the gallbladder fossa. Pancreas: Diffuse soft tissue inflammation about the pancreas is compatible with pancreatitis, as previously noted, with slightly decreased enhancement noted at the uncinate process. No definite pseudocyst formation is noted. Spleen: The spleen is unremarkable in appearance. Adrenals/Urinary Tract: The adrenal glands are unremarkable in appearance. Small left renal cysts are noted. There is no evidence of hydronephrosis. No renal or ureteral stones are identified. No perinephric stranding is seen. Stomach/Bowel: The stomach is unremarkable in appearance. The small bowel is within normal limits. The appendix is normal in caliber, without evidence of appendicitis. The colon is unremarkable in appearance. Vascular/Lymphatic: Scattered calcification is seen along the abdominal aorta and its branches. The abdominal aorta is otherwise grossly unremarkable. The inferior vena cava is grossly unremarkable. No retroperitoneal lymphadenopathy is seen. No pelvic sidewall lymphadenopathy is identified. Reproductive: The bladder is mildly distended and grossly unremarkable. The prostate is borderline normal in size. Other: No additional soft tissue abnormalities are seen. Musculoskeletal: No acute osseous abnormalities are identified. Multilevel vacuum phenomenon is noted along the lower thoracic and lumbar spine, with mild underlying facet disease. The visualized musculature is unremarkable in appearance. IMPRESSION: 1. Diffuse soft tissue inflammation about the pancreas is compatible with acute pancreatitis, as previously noted, with slightly decreased enhancement noted at the uncinate process. No definite pseudocyst formation seen. 2. Diffuse dilatation of the intrahepatic biliary ducts and common bile duct, as previously noted, with a common bile duct stent seen in expected position. Diffuse  dilatation of the pancreatic duct. 3. Mild bibasilar airspace opacities likely reflect atelectasis. 4. Scattered coronary artery calcifications seen. 5. Small left renal cysts noted. 6. Mild degenerative change along the lower thoracic and lumbar spine. Aortic Atherosclerosis (ICD10-I70.0). Electronically Signed   By: Garald Balding M.D.   On: 03/27/2018 23:24   Ct Abdomen Pelvis W Contrast  Result Date: 03/11/2018 CLINICAL DATA:  Increased weakness with generalized abdominal pain EXAM:  CT ABDOMEN AND PELVIS WITH CONTRAST TECHNIQUE: Multidetector CT imaging of the abdomen and pelvis was performed using the standard protocol following bolus administration of intravenous contrast. CONTRAST:  49mL OMNIPAQUE IOHEXOL 300 MG/ML  SOLN COMPARISON:  01/04/2018, 12/27/2017 FINDINGS: Lower chest: Lung bases demonstrate hazy dependent atelectasis. Heart size within normal limits. Hepatobiliary: Intra and extrahepatic biliary dilatation status post cholecystectomy. Common bile duct diameter up to 13 mm. Pancreas: Diffuse ductal dilatation. Hazy indistinct appearance of pancreas consistent with pancreatitis. No organized fluid collection. Spleen: Normal in size without focal abnormality. Adrenals/Urinary Tract: Adrenal glands are normal. Scarring left kidney. Cyst mid left kidney. No hydronephrosis. The bladder is normal Stomach/Bowel: Stomach is nonenlarged. No dilated small bowel. No colon wall thickening. Sigmoid colon diverticula without acute inflammatory process. Mild retained feces at the rectum. Vascular/Lymphatic: Moderate aortic atherosclerosis. No aneurysm. No significantly enlarged lymph nodes. Reproductive: Prostate calcification with slight enlargement. Other: Negative for free air or free fluid. Small fat in the left inguinal canal. Musculoskeletal: Moderate severe diffuse degenerative changes. Trace retrolisthesis L3 on L4, L2 on L3. No acute or suspicious abnormality. IMPRESSION: 1. Indistinct appearance of  pancreas with mild surrounding edema/stranding, suspicious for an acute pancreatitis. No organized fluid collection. 2. Interval cholecystectomy with development of intra and extrahepatic biliary enlargement which may be correlated with LFTs. 3. Sigmoid colon diverticular disease without acute inflammatory process Electronically Signed   By: Donavan Foil M.D.   On: 03/11/2018 23:09   Mr Abdomen Mrcp Wo Contrast  Result Date: 03/12/2018 CLINICAL DATA:  Abdominal pain.  Suspected pancreatitis. EXAM: MRI ABDOMEN WITHOUT CONTRAST  (INCLUDING MRCP) TECHNIQUE: Multiplanar multisequence MR imaging of the abdomen was performed. Heavily T2-weighted images of the biliary and pancreatic ducts were obtained, and three-dimensional MRCP images were rendered by post processing. COMPARISON:  CT scan 03/11/2018 FINDINGS: Lower chest: The lung bases are clear of an acute process. No pleural or pericardial effusion. Hepatobiliary: Moderate intra and extrahepatic biliary dilatation. The common bile duct measures up to 13 mm in the porta hepatis but narrows quickly in the pancreatic head. No focal hepatic lesions. Gallbladder is surgically absent. Pancreas: The pancreas is enlarged and inflamed consistent with acute pancreatitis. Abrupt caliber change of the pancreatic duct in the pancreatic head likely due to inflammation. No masses identified. Spleen:  Normal size.  No focal lesions. Adrenals/Urinary Tract: The adrenal glands and kidneys are grossly normal. Small left renal cysts are noted. Stomach/Bowel: Visualized portions within the abdomen are unremarkable. Vascular/Lymphatic: No pathologically enlarged lymph nodes identified. No abdominal aortic aneurysm demonstrated. Other:  No ascites or abdominal wall hernia. Musculoskeletal: No significant bony findings. IMPRESSION: 1. Enlarged and inflamed pancreas consistent with acute pancreatitis. 2. Intra and extrahepatic biliary dilatation with compression of the distal common  bile duct in the head of the pancreas likely due to the surrounding pancreatitis. Similar findings involving the main pancreatic duct. 3. Status post cholecystectomy. 4. Simple appearing renal cysts. Electronically Signed   By: Marijo Sanes M.D.   On: 03/12/2018 19:48   Mr 3d Recon At Scanner  Result Date: 03/12/2018 CLINICAL DATA:  Abdominal pain.  Suspected pancreatitis. EXAM: MRI ABDOMEN WITHOUT CONTRAST  (INCLUDING MRCP) TECHNIQUE: Multiplanar multisequence MR imaging of the abdomen was performed. Heavily T2-weighted images of the biliary and pancreatic ducts were obtained, and three-dimensional MRCP images were rendered by post processing. COMPARISON:  CT scan 03/11/2018 FINDINGS: Lower chest: The lung bases are clear of an acute process. No pleural or pericardial effusion. Hepatobiliary: Moderate intra and extrahepatic  biliary dilatation. The common bile duct measures up to 13 mm in the porta hepatis but narrows quickly in the pancreatic head. No focal hepatic lesions. Gallbladder is surgically absent. Pancreas: The pancreas is enlarged and inflamed consistent with acute pancreatitis. Abrupt caliber change of the pancreatic duct in the pancreatic head likely due to inflammation. No masses identified. Spleen:  Normal size.  No focal lesions. Adrenals/Urinary Tract: The adrenal glands and kidneys are grossly normal. Small left renal cysts are noted. Stomach/Bowel: Visualized portions within the abdomen are unremarkable. Vascular/Lymphatic: No pathologically enlarged lymph nodes identified. No abdominal aortic aneurysm demonstrated. Other:  No ascites or abdominal wall hernia. Musculoskeletal: No significant bony findings. IMPRESSION: 1. Enlarged and inflamed pancreas consistent with acute pancreatitis. 2. Intra and extrahepatic biliary dilatation with compression of the distal common bile duct in the head of the pancreas likely due to the surrounding pancreatitis. Similar findings involving the main  pancreatic duct. 3. Status post cholecystectomy. 4. Simple appearing renal cysts. Electronically Signed   By: Marijo Sanes M.D.   On: 03/12/2018 19:48   Dg Chest Portable 1 View  Result Date: 03/11/2018 CLINICAL DATA:  Weakness 1 week. EXAM: PORTABLE CHEST 1 VIEW COMPARISON:  12/27/2017 FINDINGS: Lungs are adequately inflated without focal consolidation or effusion. Cardiomediastinal silhouette and remainder of the exam is unchanged. IMPRESSION: No active disease. Electronically Signed   By: Marin Olp M.D.   On: 03/11/2018 21:41   Dg Ercp  Result Date: 03/29/2018 CLINICAL DATA:  84 year old male with acute pancreatitis and biliary ductal dilatation. EXAM: ERCP TECHNIQUE: Multiple spot images obtained with the fluoroscopic device and submitted for interpretation post-procedure. FLUOROSCOPY TIME:  Fluoroscopy Time:  1 minutes 34 seconds reported COMPARISON:  None. FINDINGS: A single intraoperative saved image is submitted for review. The image demonstrates a flexible endoscope in the descending duodenum with partial wire cannulation. No cholangiogram images were submitted. Please see procedural note for further detail. IMPRESSION: ERCP as above.  Please see procedural note for further detail. These images were submitted for radiologic interpretation only. Please see the procedural report for the amount of contrast and the fluoroscopy time utilized. Electronically Signed   By: Jacqulynn Cadet M.D.   On: 03/29/2018 10:57   Dg Ercp Biliary & Pancreatic Ducts  Result Date: 03/30/2018 CLINICAL DATA:  ERCP with biliary stent placement. EXAM: ERCP TECHNIQUE: Multiple spot images obtained with the fluoroscopic device and submitted for interpretation post-procedure. FLUOROSCOPY TIME:  292 seconds COMPARISON:  ERCP-03/29/2018; 03/13/2018; CT of the abdomen and pelvis-03/27/2018 FINDINGS: Five spot intraoperative fluoroscopic images during ERCP are provided for review. Initial image demonstrates an ERCP  probe overlying the right upper abdominal quadrant. There is selective cannulation and opacification of the common bile duct which appears mild to moderately dilated. There is minimal opacification intrahepatic biliary tree which demonstrates apparent mild diffuse beaded irregularity. Subsequent images demonstrate insufflation of a balloon within the mid aspect of the CBD with subsequent presumed biliary sweeping and sphincterotomy. Despite this, there is a persistent relatively long segment narrowing involving the distal aspect of the CBD. Completion image demonstrates placement of an internal metallic biliary stent traversing the long segment narrowing of the CBD with passage of contrast to the level of the duodenum. IMPRESSION: ERCP with long segment severe narrowing of the distal aspect of the CBD with subsequent internal metallic biliary stent placement. These images were submitted for radiologic interpretation only. Please see the procedural report for the amount of contrast and the fluoroscopy time utilized. Electronically Signed  By: Sandi Mariscal M.D.   On: 03/30/2018 13:51   Dg C-arm 1-60 Min-no Report  Result Date: 03/13/2018 Fluoroscopy was utilized by the requesting physician.  No radiographic interpretation.   US Abdomen Limited Ruq  Result Date: 03/12/2018 CLINICAL DATA:  84 year old with pancreatitis. Cholecystectomy last month. Elevated LFTs and bilirubin. EXAM: ULTRASOUND ABDOMEN LIMITED RIGHT UPPER QUADRANT COMPARISON:  Abdominal CT yesterday. FINDINGS: Gallbladder: Surgically absent.  No fluid collection in the gallbladder fossa. Common bile duct: Diameter: Dilated at 11 mm.  Distal CBD is obscured. Liver: Intrahepatic biliary ductal dilatation. No focal lesion identified. Within normal limits in parenchymal echogenicity. Portal vein is patent on color Doppler imaging with normal direction of blood flow towards the liver. IMPRESSION: 1. Intra and extrahepatic biliary ductal dilatation,  seen on CT. The distal common bile duct is obscured. No choledocholithiasis were visualized. 2. Postcholecystectomy. Electronically Signed   By: Keith Rake M.D.   On: 03/12/2018 01:22     CBC Recent Labs  Lab 03/27/18 1854 03/28/18 0425 03/30/18 0150 03/30/18 1225 03/31/18 0341 04/01/18 0135  WBC 9.6 7.9 7.5  --  8.6 7.8  HGB 10.2* 9.0* 9.0* 9.2* 8.9* 8.6*  HCT 33.1* 29.0* 27.0* 27.0* 28.2* 27.3*  PLT 177 165 131*  --  119* 133*  MCV 104.1* 99.7 94.7  --  97.9 97.5  MCH 32.1 30.9 31.6  --  30.9 30.7  MCHC 30.8 31.0 33.3  --  31.6 31.5  RDW 15.6* 15.9* 15.4  --  15.9* 16.3*  LYMPHSABS 0.6* 0.9  --   --  0.7  --   MONOABS 1.0 1.0  --   --  1.1*  --   EOSABS 0.4 0.2  --   --  0.1  --   BASOSABS 0.0 0.0  --   --  0.0  --     Chemistries  Recent Labs  Lab 03/27/18 1854 03/28/18 0425 03/29/18 1058 03/30/18 0150 03/30/18 1225 03/31/18 0341 04/01/18 0135  NA 136 135  --  136 137 137 134*  K 4.7 4.4  --  2.8* 3.5 3.4* 3.4*  CL 106 108  --  105  --  107 105  CO2 20* 21*  --  24  --  23 22  GLUCOSE 117* 84  --  125* 120* 118* 124*  BUN 23 18  --  7*  --  7* 9  CREATININE 1.03 1.00  --  0.74  --  0.85 0.80  CALCIUM 9.2 8.6*  --  8.4*  --  8.5* 8.3*  MG 2.0  --   --   --   --   --  1.6*  AST 92* 105* 115* 116*  --  102* 72*  ALT 151* 115* 114* 102*  --  96* 77*  ALKPHOS 558* 489* 713* 769*  --  776* 763*  BILITOT 4.7* 5.4* 8.6* 8.5*  --  8.6* 5.6*     Bonnielee Haff M.D   Go to [email or web address] - password [email or web address] for contact info  [facility] - Office  [phone]

## 2018-04-01 NOTE — Progress Notes (Signed)
Wilson Singer [place] 11:37 AM  Subjective: Patient doing well case discussed with  his daughter-in-law and other than not eating well no other complaints  Objective: Stable afebrile no acute distress abdomen is soft nontender LFTs decreasing white count okay  Assessment: Cholangitis status post ERCP and stenting  Plan: As long as no new medical problems and liver tests decreasing tomorrow probably can go home and would continue oral antibiotics for 5 more days unless you think he needs more and he has a follow-up with his primary care doctor in 1 week and they can repeat liver tests at that time and forward to me and then we can decide when I need to see him back probably 1 month unless needed sooner as needed [facility] E  Pager [phone] After 5PM or if no answer call [phone]

## 2018-04-02 ENCOUNTER — Inpatient Hospital Stay (HOSPITAL_COMMUNITY): Payer: Medicare Other

## 2018-04-02 LAB — COMPREHENSIVE METABOLIC PANEL
ALBUMIN: 2.3 g/dL — AB (ref 3.5–5.0)
ALT: 77 U/L — AB (ref 0–44)
AST: 74 U/L — AB (ref 15–41)
Alkaline Phosphatase: 988 U/L — ABNORMAL HIGH (ref 38–126)
Anion gap: 9 (ref 5–15)
BUN: 11 mg/dL (ref 8–23)
CHLORIDE: 101 mmol/L (ref 98–111)
CO2: 22 mmol/L (ref 22–32)
Calcium: 8.4 mg/dL — ABNORMAL LOW (ref 8.9–10.3)
Creatinine, Ser: 0.9 mg/dL (ref 0.61–1.24)
GFR calc Af Amer: >60 mL/min (ref >60)
GLUCOSE: 135 mg/dL — AB (ref 70–99)
Potassium: 3.9 mmol/L (ref 3.5–5.1)
SODIUM: 132 mmol/L — AB (ref 135–145)
Total Bilirubin: 5.7 mg/dL — ABNORMAL HIGH (ref 0.3–1.2)
Total Protein: 6 g/dL — ABNORMAL LOW (ref 6.5–8.1)

## 2018-04-02 LAB — CULTURE, BLOOD (ROUTINE X 2)
CULTURE: NO GROWTH
Culture: NO GROWTH

## 2018-04-02 LAB — MAGNESIUM: Magnesium: 2.2 mg/dL (ref 1.7–2.4)

## 2018-04-02 LAB — GLUCOSE, CAPILLARY: Glucose-Capillary: 118 mg/dL — ABNORMAL HIGH (ref 70–99)

## 2018-04-02 MED ORDER — ZOLPIDEM TARTRATE 5 MG PO TABS
5.0000 mg | ORAL_TABLET | Freq: Once | ORAL | Status: DC
  Filled 2018-04-02: qty 1

## 2018-04-02 NOTE — Progress Notes (Addendum)
Quintyn Worth [place] 5:29 PM  Subjective: Patient doing well overall case discussed with hospital team and his family and the only concern is hisessentially unchanged liver testsand we answered all of their questions  Objective: Vital signs stable afebrile no acute distress abdomen is soft nontender patient does look less jaundicedalkaline phosphatase increased a little other liver tests about the same x-ray I think is fine I do  thinkstent is in proper position and as an aside Dr. Therisa Doyne cytology from her ERCP was negative  Assessment: Elevated liver tests cholangitis  Plan: Hopefully liver tests will decrease tomorrow and he'll be able to go home if not consider a limited CT through the bile ducts to evaluate the stent for possible sludge and debris and might eventually need repeat ERCP for evaluation but would like to hold off as long as possible and the warnings of when to call was discussed with the family and will set up a lab appointment next week and keep tabs on how he is doing and they are aware he'll need stent removal or replacement down the road  [facility] E  Pager [phone] After 5PM or if no answer call [phone]

## 2018-04-02 NOTE — Evaluation (Signed)
Occupational Therapy Evaluation and Discharge Patient Details Name: Jose [name] MRN: [id number] DOB: [birth year] Today's Date: 04/02/2018    History of Present Illness Pt is 84 y.o. male s/p ERCP (03/29/18), with the diagnosis of obstructive jaundice and complaints of epigastric discomfort. Recent surgical history includes laparoscopic cholecystectomy and subsequent ERCP (03/13/18) for ascending cholangitis and bacteremia. PMH significant for cervical radiculopathy with neuropathy, back problems, anxiety, dyspnea, HTN, BPH and GERD.    Clinical Impression   This 84 yo male admitted and underwent above presents to acute OT at a minguard A level when up on his feet due to moving fast. Per PT conversation family will be with pt 24/7. He will benefit from continued HHOT to address any safety/independence issues there may be in his normal environment.  We will D/C from acute OT.     Follow Up Recommendations  Home health OT;Supervision/Assistance - 24 hour    Equipment Recommendations  None recommended by OT       Precautions / Restrictions Precautions Precautions: Fall Restrictions Weight Bearing Restrictions: No      Mobility Bed Mobility Overal bed mobility: Needs Assistance Bed Mobility: Supine to Sit     Supine to sit: Supervision     General bed mobility comments: for safety  Transfers Overall transfer level: Needs assistance Equipment used: Rolling walker (2 wheeled) Transfers: Sit to/from Stand Sit to Stand: Min guard         General transfer comment: Pt has one speed (fast)    Balance Overall balance assessment: Needs assistance Sitting-balance support: Feet supported;No upper extremity supported Sitting balance-Leahy Scale: Good     Standing balance support: No upper extremity supported;During functional activity Standing balance-Leahy Scale: Fair Standing balance comment: standing at sink to wash hands without propping against sink                            ADL either performed or assessed with clinical judgement   ADL Overall ADL's : Needs assistance/impaired Eating/Feeding: Independent;Sitting   Grooming: Wash/dry hands;Min guard;Standing   Upper Body Bathing: Set up;Supervision/ safety;Sitting   Lower Body Bathing: Min guard;Sit to/from stand   Upper Body Dressing : Set up;Supervision/safety;Sitting   Lower Body Dressing: Min guard;Sit to/from stand   Toilet Transfer: Min guard;Ambulation;RW   Toileting- Water quality scientist and Hygiene: Min guard;Sit to/from stand               Vision Patient Visual Report: No change from baseline              Pertinent Vitals/Pain Pain Assessment: No/denies pain Pain Intervention(s): Limited activity within patient's tolerance;Monitored during session     Hand Dominance Right   Extremity/Trunk Assessment Upper Extremity Assessment Upper Extremity Assessment: Overall WFL for tasks assessed     Communication Communication Communication: HOH   Cognition Arousal/Alertness: Awake/alert Behavior During Therapy: Impulsive Overall Cognitive Status: Impaired/Different from baseline Area of Impairment: Following commands;Safety/judgement                      Following Commands: Follows one step commands consistently;Follows multi-step commands inconsistently Safety/Judgement: Decreased awareness of safety   General Comments: Pt shows decreased safety awareness with eagerness to move quickly, pt reports he only lives with his dog           Home Living Family/patient expects to be discharged to:: Private residence Living Arrangements: Alone Available Help at Discharge: Family;Available 24 hours/day Type of Home: House  Home Access: Ramped entrance     Home Layout: One level     Bathroom Shower/Tub: Occupational psychologist: Standard Bathroom Accessibility: Yes   Home Equipment: Environmental consultant - 2 wheels   Additional Comments: pt is  poor historian limited by cognitive/hearing deficits      Prior Functioning/Environment Level of Independence: Needs assistance  Gait / Transfers Assistance Needed: walks with RW ADL's / Homemaking Assistance Needed: family assists with dressing, bathing and cooking Communication / Swallowing Assistance Needed: HOH, speaks softly, not very interactive with communication          OT Problem List: Impaired balance (sitting and/or standing);Decreased cognition;Decreased safety awareness         OT Goals(Current goals can be found in the care plan section) Acute Rehab OT Goals Patient Stated Goal: to go home  OT Frequency:                AM-PAC PT "6 Clicks" Daily Activity     Outcome Measure Help from another person eating meals?: None Help from another person taking care of personal grooming?: A Little Help from another person toileting, which includes using toliet, bedpan, or urinal?: A Little Help from another person bathing (including washing, rinsing, drying)?: A Little Help from another person to put on and taking off regular upper body clothing?: A Little Help from another person to put on and taking off regular lower body clothing?: A Little 6 Click Score: 19   End of Session Equipment Utilized During Treatment: Rolling walker Nurse Communication: Mobility status(sitter)  Activity Tolerance: Patient tolerated treatment well Patient left: in bed;with call bell/phone within reach(sitter in room)  OT Visit Diagnosis: Unsteadiness on feet (R26.81);Other symptoms and signs involving cognitive function                Time: 7124-5809 OT Time Calculation (min): 18 min Charges:  OT General Charges $OT Visit: 1 Visit OT Evaluation $OT Eval Moderate Complexity: 1 Mod  [address], OTR/L Acute [employer] Pager [phone] Office [phone]     Almon Register 04/02/2018, 3:45 PM

## 2018-04-02 NOTE — Progress Notes (Signed)
Patient Demographics:    Jose [name], is a 84 y.o. male, DOB - 04-26-1924, YTK:[id number]  Admit date - 03/27/2018   Admitting Physician [name], MD  Outpatient Primary MD for the patient is [name], MD  LOS - 5   Chief Complaint  Patient presents with  . Abnormal Lab  . Weakness        Subjective:   Patient denies any abdominal pain.  No nausea or vomiting   Assessment  & Plan :    Principal Problem:   Obstructive jaundice Active Problems:   Essential hypertension, benign   Anemia   Elevated liver enzymes   Pancreatitis due to biliary obstruction   History of ESBL E. coli infection   Acute pancreatitis   Hyperbilirubinemia   Brief Summary: 84 y.o.malepresented with complaints of epigastric discomfort and abnormal lab showing elevated liver enzymes, status post recent lap chole with ERCP that identified a stricture, status post biliary stenting on 03/12/2018, discharged on Levaquin on 03/17/2018.  Readmitted on 03/28/2018 with persistent abdominal pain and elevated LFTs with a T bili of 4.7, with CT abdomen and clinical exam consistent with acute pancreatitis.  Concern was for cholangitis as well.  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 Patient underwent "incomplete"  ERCP on 03/29/2018 with retrieval of plastic stent. Subsequent Repeat ERCP 03/30/18 with placement of covered biliary metal stent.  Patient's AST ALT are stable.  Bilirubin is stable this morning.  However alkaline phosphatase is significantly higher today compared to yesterday.  Patient is noted to be completely asymptomatic.  Discussed with Dr. Watt Climes with gastroenterology.  Abdominal film was done which showed the stent in reasonable position.  Discussed with patient's daughter-in-law was very uncomfortable taking him  home with rising alkaline phosphatase.  Dr. Watt Climes will go by the room later today.  There was concern for possible ascending cholangitis.  Patient was started on ceftriaxone.  Cultures have been negative.  Patient was transitioned to Augmentin.   Essential hypertension  Stable.  Continue home medications.   Recent ESBL Bacteremia Patient was treated with Meropenem as inpatient at [facility] and discharged home on Levaquin which he completed on 03/26/2018.  Repeat cultures negative so far.  BPH with LUTS Continue Flomax.   Normocytic anemia  Drop in hemoglobin is likely dilutional.  No evidence of overt bleeding.  Noted to be on iron supplements at home which can be resumed.  Hypokalemia and hypomagnesemia The potassium and magnesium levels are normal this morning.   Code Status : Full Disposition Plan : Hopefully home with home health when ready for discharge.  PT and OT evaluation  Procedures: ERCP by Dr. Therisa Doyne on 03/29/2018 with removal of plastic biliary stent ERCP by Dr. Watt Climes on 03/30/2018 with placement of covered biliary metal stent  Consults  :  Gi  DVT Prophylaxis  :   SCDs   Inpatient Medications  Scheduled Meds: . amoxicillin-clavulanate  1 tablet Oral Q12H  . famotidine  20 mg Oral BID  . ferrous sulfate  325 mg Oral Q breakfast  . metoprolol tartrate  25 mg Oral BID  . tamsulosin  0.4 mg Oral QPC supper   Continuous Infusions: . dextrose 5 %  and 0.45% NaCl 10 mL/hr at 04/02/18 0648   PRN Meds:.acetaminophen **OR** acetaminophen, labetalol, ondansetron **OR** ondansetron (ZOFRAN) IV, traMADol    Anti-infectives (From admission, onward)   Start     Dose/Rate Route Frequency Ordered Stop   04/01/18 1400  amoxicillin-clavulanate (AUGMENTIN) 875-125 MG per tablet 1 tablet     1 tablet Oral Every 12 hours 04/01/18 1321     03/30/18 1600  cefTRIAXone (ROCEPHIN) 1 g in sodium chloride 0.9 % 100 mL IVPB  Status:  Discontinued     1 g 200 mL/hr  over 30 Minutes Intravenous Every 24 hours 03/29/18 1430 04/01/18 1321   03/29/18 1445  cefTRIAXone (ROCEPHIN) 2 g in sodium chloride 0.9 % 100 mL IVPB     2 g 200 mL/hr over 30 Minutes Intravenous  Once 03/29/18 1430 03/29/18 1618   03/29/18 1430  cefTRIAXone (ROCEPHIN) 2 g in sodium chloride 0.9 % 100 mL IVPB  Status:  Discontinued     2 g 200 mL/hr over 30 Minutes Intravenous Every 24 hours 03/29/18 1429 03/29/18 1430        Objective:   Vitals:   04/01/18 2003 04/02/18 0500 04/02/18 0608 04/02/18 1028  BP: 123/74  (!) 137/96   Pulse: 87  94 84  Resp: (!) 22  14   Temp: 98.2 F (36.8 C)  (!) 97.3 F (36.3 C)   TempSrc: Oral  Oral   SpO2: 98%  99%   Weight:  63.5 kg      Wt Readings from Last 3 Encounters:  04/02/18 63.5 kg  03/24/18 57.6 kg  03/12/18 56.9 kg     Intake/Output Summary (Last 24 hours) at 04/02/2018 1411 Last data filed at 04/02/2018 0648 Gross per 24 hour  Intake 676.36 ml  Output -  Net 676.36 ml     Physical Exam  Awake alert.  In no distress Lungs are clear to auscultation bilaterally S1-S2 is normal regular Abdomen soft.  Nontender nondistended.  No masses organomegaly.  Bowel sounds are present and normal Awake alert.  Distracted.  No focal neurological deficit    Data Review:   Micro Results Recent Results (from the past 240 hour(s))  Culture, blood (routine x 2)     Status: None   Collection Time: 03/28/18 12:40 AM  Result Value Ref Range Status   Specimen Description BLOOD RIGHT ARM  Final   Special Requests   Final    BOTTLES DRAWN AEROBIC AND ANAEROBIC Blood Culture results may not be optimal due to an excessive volume of blood received in culture bottles   Culture   Final    NO GROWTH 5 DAYS Performed at [facility] [address] [place], [place] [address]    Report Status 04/02/2018 FINAL  Final  Culture, blood (routine x 2)     Status: None   Collection Time: 03/28/18  1:00 AM  Result Value Ref Range Status    Specimen Description BLOOD RIGHT HAND  Final   Special Requests   Final    BOTTLES DRAWN AEROBIC ONLY Blood Culture results may not be optimal due to an excessive volume of blood received in culture bottles   Culture   Final    NO GROWTH 5 DAYS Performed at [facility], [place] [address] [place], [place] [address]    Report Status 04/02/2018 FINAL  Final  MRSA PCR Screening     Status: None   Collection Time: 03/29/18  7:37 AM  Result Value  Ref Range Status   MRSA by PCR NEGATIVE NEGATIVE Final    Comment:        The GeneXpert MRSA Assay (FDA approved for NASAL specimens only), is one component of a comprehensive MRSA colonization surveillance program. It is not intended to diagnose MRSA infection nor to guide or monitor treatment for MRSA infections. Performed at [facility], [place] [address], [place], [place] [address]   Culture, blood (Routine X 2) w Reflex to ID Panel     Status: None (Preliminary result)   Collection Time: 03/29/18  6:54 PM  Result Value Ref Range Status   Specimen Description BLOOD BLOOD LEFT HAND  Final   Special Requests   Final    BOTTLES DRAWN AEROBIC ONLY Blood Culture results may not be optimal due to an inadequate volume of blood received in culture bottles   Culture   Final    NO GROWTH 4 DAYS Performed at [facility], [place] [address], [place], [place] [address]    Report Status PENDING  Incomplete    Radiology Reports Ct Abdomen Pelvis W Contrast  Result Date: 03/27/2018 CLINICAL DATA:  Acute onset of generalized weakness. Elevated bilirubin. EXAM: CT ABDOMEN AND PELVIS WITH CONTRAST TECHNIQUE: Multidetector CT imaging of the abdomen and pelvis was performed using the standard protocol following bolus administration of intravenous contrast. CONTRAST:  113mL OMNIPAQUE IOHEXOL 300 MG/ML  SOLN COMPARISON:  CT of the abdomen and pelvis performed 03/11/2018, and MRCP performed 03/12/2018 FINDINGS: Lower chest: Mild bibasilar airspace  opacities likely reflect atelectasis. Scattered coronary artery calcifications are seen. Hepatobiliary: There is diffuse dilatation of the intrahepatic biliary ducts and common bile duct, as previously noted, with a common bile duct stent seen in expected position. There is also diffuse dilatation of the pancreatic duct. The liver is otherwise grossly unremarkable in appearance. The patient is status post cholecystectomy, with clips noted at the gallbladder fossa. Pancreas: Diffuse soft tissue inflammation about the pancreas is compatible with pancreatitis, as previously noted, with slightly decreased enhancement noted at the uncinate process. No definite pseudocyst formation is noted. Spleen: The spleen is unremarkable in appearance. Adrenals/Urinary Tract: The adrenal glands are unremarkable in appearance. Small left renal cysts are noted. There is no evidence of hydronephrosis. No renal or ureteral stones are identified. No perinephric stranding is seen. Stomach/Bowel: The stomach is unremarkable in appearance. The small bowel is within normal limits. The appendix is normal in caliber, without evidence of appendicitis. The colon is unremarkable in appearance. Vascular/Lymphatic: Scattered calcification is seen along the abdominal aorta and its branches. The abdominal aorta is otherwise grossly unremarkable. The inferior vena cava is grossly unremarkable. No retroperitoneal lymphadenopathy is seen. No pelvic sidewall lymphadenopathy is identified. Reproductive: The bladder is mildly distended and grossly unremarkable. The prostate is borderline normal in size. Other: No additional soft tissue abnormalities are seen. Musculoskeletal: No acute osseous abnormalities are identified. Multilevel vacuum phenomenon is noted along the lower thoracic and lumbar spine, with mild underlying facet disease. The visualized musculature is unremarkable in appearance. IMPRESSION: 1. Diffuse soft tissue inflammation about the  pancreas is compatible with acute pancreatitis, as previously noted, with slightly decreased enhancement noted at the uncinate process. No definite pseudocyst formation seen. 2. Diffuse dilatation of the intrahepatic biliary ducts and common bile duct, as previously noted, with a common bile duct stent seen in expected position. Diffuse dilatation of the pancreatic duct. 3. Mild bibasilar airspace opacities likely reflect atelectasis. 4. Scattered coronary artery calcifications seen.  5. Small left renal cysts noted. 6. Mild degenerative change along the lower thoracic and lumbar spine. Aortic Atherosclerosis (ICD10-I70.0). Electronically Signed   By: Garald Balding M.D.   On: 03/27/2018 23:24   Ct Abdomen Pelvis W Contrast  Result Date: 03/11/2018 CLINICAL DATA:  Increased weakness with generalized abdominal pain EXAM: CT ABDOMEN AND PELVIS WITH CONTRAST TECHNIQUE: Multidetector CT imaging of the abdomen and pelvis was performed using the standard protocol following bolus administration of intravenous contrast. CONTRAST:  38mL OMNIPAQUE IOHEXOL 300 MG/ML  SOLN COMPARISON:  01/04/2018, 12/27/2017 FINDINGS: Lower chest: Lung bases demonstrate hazy dependent atelectasis. Heart size within normal limits. Hepatobiliary: Intra and extrahepatic biliary dilatation status post cholecystectomy. Common bile duct diameter up to 13 mm. Pancreas: Diffuse ductal dilatation. Hazy indistinct appearance of pancreas consistent with pancreatitis. No organized fluid collection. Spleen: Normal in size without focal abnormality. Adrenals/Urinary Tract: Adrenal glands are normal. Scarring left kidney. Cyst mid left kidney. No hydronephrosis. The bladder is normal Stomach/Bowel: Stomach is nonenlarged. No dilated small bowel. No colon wall thickening. Sigmoid colon diverticula without acute inflammatory process. Mild retained feces at the rectum. Vascular/Lymphatic: Moderate aortic atherosclerosis. No aneurysm. No significantly  enlarged lymph nodes. Reproductive: Prostate calcification with slight enlargement. Other: Negative for free air or free fluid. Small fat in the left inguinal canal. Musculoskeletal: Moderate severe diffuse degenerative changes. Trace retrolisthesis L3 on L4, L2 on L3. No acute or suspicious abnormality. IMPRESSION: 1. Indistinct appearance of pancreas with mild surrounding edema/stranding, suspicious for an acute pancreatitis. No organized fluid collection. 2. Interval cholecystectomy with development of intra and extrahepatic biliary enlargement which may be correlated with LFTs. 3. Sigmoid colon diverticular disease without acute inflammatory process Electronically Signed   By: Donavan Foil M.D.   On: 03/11/2018 23:09   Mr Abdomen Mrcp Wo Contrast  Result Date: 03/12/2018 CLINICAL DATA:  Abdominal pain.  Suspected pancreatitis. EXAM: MRI ABDOMEN WITHOUT CONTRAST  (INCLUDING MRCP) TECHNIQUE: Multiplanar multisequence MR imaging of the abdomen was performed. Heavily T2-weighted images of the biliary and pancreatic ducts were obtained, and three-dimensional MRCP images were rendered by post processing. COMPARISON:  CT scan 03/11/2018 FINDINGS: Lower chest: The lung bases are clear of an acute process. No pleural or pericardial effusion. Hepatobiliary: Moderate intra and extrahepatic biliary dilatation. The common bile duct measures up to 13 mm in the porta hepatis but narrows quickly in the pancreatic head. No focal hepatic lesions. Gallbladder is surgically absent. Pancreas: The pancreas is enlarged and inflamed consistent with acute pancreatitis. Abrupt caliber change of the pancreatic duct in the pancreatic head likely due to inflammation. No masses identified. Spleen:  Normal size.  No focal lesions. Adrenals/Urinary Tract: The adrenal glands and kidneys are grossly normal. Small left renal cysts are noted. Stomach/Bowel: Visualized portions within the abdomen are unremarkable. Vascular/Lymphatic: No  pathologically enlarged lymph nodes identified. No abdominal aortic aneurysm demonstrated. Other:  No ascites or abdominal wall hernia. Musculoskeletal: No significant bony findings. IMPRESSION: 1. Enlarged and inflamed pancreas consistent with acute pancreatitis. 2. Intra and extrahepatic biliary dilatation with compression of the distal common bile duct in the head of the pancreas likely due to the surrounding pancreatitis. Similar findings involving the main pancreatic duct. 3. Status post cholecystectomy. 4. Simple appearing renal cysts. Electronically Signed   By: Marijo Sanes M.D.   On: 03/12/2018 19:48   Mr 3d Recon At Scanner  Result Date: 03/12/2018 CLINICAL DATA:  Abdominal pain.  Suspected pancreatitis. EXAM: MRI ABDOMEN WITHOUT CONTRAST  (INCLUDING MRCP) TECHNIQUE: Multiplanar  multisequence MR imaging of the abdomen was performed. Heavily T2-weighted images of the biliary and pancreatic ducts were obtained, and three-dimensional MRCP images were rendered by post processing. COMPARISON:  CT scan 03/11/2018 FINDINGS: Lower chest: The lung bases are clear of an acute process. No pleural or pericardial effusion. Hepatobiliary: Moderate intra and extrahepatic biliary dilatation. The common bile duct measures up to 13 mm in the porta hepatis but narrows quickly in the pancreatic head. No focal hepatic lesions. Gallbladder is surgically absent. Pancreas: The pancreas is enlarged and inflamed consistent with acute pancreatitis. Abrupt caliber change of the pancreatic duct in the pancreatic head likely due to inflammation. No masses identified. Spleen:  Normal size.  No focal lesions. Adrenals/Urinary Tract: The adrenal glands and kidneys are grossly normal. Small left renal cysts are noted. Stomach/Bowel: Visualized portions within the abdomen are unremarkable. Vascular/Lymphatic: No pathologically enlarged lymph nodes identified. No abdominal aortic aneurysm demonstrated. Other:  No ascites or abdominal  wall hernia. Musculoskeletal: No significant bony findings. IMPRESSION: 1. Enlarged and inflamed pancreas consistent with acute pancreatitis. 2. Intra and extrahepatic biliary dilatation with compression of the distal common bile duct in the head of the pancreas likely due to the surrounding pancreatitis. Similar findings involving the main pancreatic duct. 3. Status post cholecystectomy. 4. Simple appearing renal cysts. Electronically Signed   By: Marijo Sanes M.D.   On: 03/12/2018 19:48   Dg Chest Portable 1 View  Result Date: 03/11/2018 CLINICAL DATA:  Weakness 1 week. EXAM: PORTABLE CHEST 1 VIEW COMPARISON:  12/27/2017 FINDINGS: Lungs are adequately inflated without focal consolidation or effusion. Cardiomediastinal silhouette and remainder of the exam is unchanged. IMPRESSION: No active disease. Electronically Signed   By: Marin Olp M.D.   On: 03/11/2018 21:41   Dg Ercp  Result Date: 03/29/2018 CLINICAL DATA:  84 year old male with acute pancreatitis and biliary ductal dilatation. EXAM: ERCP TECHNIQUE: Multiple spot images obtained with the fluoroscopic device and submitted for interpretation post-procedure. FLUOROSCOPY TIME:  Fluoroscopy Time:  1 minutes 34 seconds reported COMPARISON:  None. FINDINGS: A single intraoperative saved image is submitted for review. The image demonstrates a flexible endoscope in the descending duodenum with partial wire cannulation. No cholangiogram images were submitted. Please see procedural note for further detail. IMPRESSION: ERCP as above.  Please see procedural note for further detail. These images were submitted for radiologic interpretation only. Please see the procedural report for the amount of contrast and the fluoroscopy time utilized. Electronically Signed   By: Jacqulynn Cadet M.D.   On: 03/29/2018 10:57   Dg Ercp Biliary & Pancreatic Ducts  Result Date: 03/30/2018 CLINICAL DATA:  ERCP with biliary stent placement. EXAM: ERCP TECHNIQUE: Multiple  spot images obtained with the fluoroscopic device and submitted for interpretation post-procedure. FLUOROSCOPY TIME:  292 seconds COMPARISON:  ERCP-03/29/2018; 03/13/2018; CT of the abdomen and pelvis-03/27/2018 FINDINGS: Five spot intraoperative fluoroscopic images during ERCP are provided for review. Initial image demonstrates an ERCP probe overlying the right upper abdominal quadrant. There is selective cannulation and opacification of the common bile duct which appears mild to moderately dilated. There is minimal opacification intrahepatic biliary tree which demonstrates apparent mild diffuse beaded irregularity. Subsequent images demonstrate insufflation of a balloon within the mid aspect of the CBD with subsequent presumed biliary sweeping and sphincterotomy. Despite this, there is a persistent relatively long segment narrowing involving the distal aspect of the CBD. Completion image demonstrates placement of an internal metallic biliary stent traversing the long segment narrowing of the CBD with passage  of contrast to the level of the duodenum. IMPRESSION: ERCP with long segment severe narrowing of the distal aspect of the CBD with subsequent internal metallic biliary stent placement. These images were submitted for radiologic interpretation only. Please see the procedural report for the amount of contrast and the fluoroscopy time utilized. Electronically Signed   By: Sandi Mariscal M.D.   On: 03/30/2018 13:51   Dg Abd 2 Views  Result Date: 04/02/2018 CLINICAL DATA:  Evaluate biliary stent. EXAM: ABDOMEN - 2 VIEW COMPARISON:  03/30/2018 ERCP FINDINGS: A CBD stent is noted, and may be slightly more cephalad when compared to 03/30/2018 images. Bowel gas pattern is unremarkable. Cholecystectomy clips noted. No acute or suspicious bony abnormalities identified. IMPRESSION: CBD stent which may be slightly more cephalad in position when compared to 03/30/2018 ERCP images. Electronically Signed   By: Margarette Canada  M.D.   On: 04/02/2018 12:49   Dg C-arm 1-60 Min-no Report  Result Date: 03/13/2018 Fluoroscopy was utilized by the requesting physician.  No radiographic interpretation.   US Abdomen Limited Ruq  Result Date: 03/12/2018 CLINICAL DATA:  84 year old with pancreatitis. Cholecystectomy last month. Elevated LFTs and bilirubin. EXAM: ULTRASOUND ABDOMEN LIMITED RIGHT UPPER QUADRANT COMPARISON:  Abdominal CT yesterday. FINDINGS: Gallbladder: Surgically absent.  No fluid collection in the gallbladder fossa. Common bile duct: Diameter: Dilated at 11 mm.  Distal CBD is obscured. Liver: Intrahepatic biliary ductal dilatation. No focal lesion identified. Within normal limits in parenchymal echogenicity. Portal vein is patent on color Doppler imaging with normal direction of blood flow towards the liver. IMPRESSION: 1. Intra and extrahepatic biliary ductal dilatation, seen on CT. The distal common bile duct is obscured. No choledocholithiasis were visualized. 2. Postcholecystectomy. Electronically Signed   By: Keith Rake M.D.   On: 03/12/2018 01:22     CBC Recent Labs  Lab 03/27/18 1854 03/28/18 0425 03/30/18 0150 03/30/18 1225 03/31/18 0341 04/01/18 0135  WBC 9.6 7.9 7.5  --  8.6 7.8  HGB 10.2* 9.0* 9.0* 9.2* 8.9* 8.6*  HCT 33.1* 29.0* 27.0* 27.0* 28.2* 27.3*  PLT 177 165 131*  --  119* 133*  MCV 104.1* 99.7 94.7  --  97.9 97.5  MCH 32.1 30.9 31.6  --  30.9 30.7  MCHC 30.8 31.0 33.3  --  31.6 31.5  RDW 15.6* 15.9* 15.4  --  15.9* 16.3*  LYMPHSABS 0.6* 0.9  --   --  0.7  --   MONOABS 1.0 1.0  --   --  1.1*  --   EOSABS 0.4 0.2  --   --  0.1  --   BASOSABS 0.0 0.0  --   --  0.0  --     Chemistries  Recent Labs  Lab 03/27/18 1854 03/28/18 0425 03/29/18 1058 03/30/18 0150 03/30/18 1225 03/31/18 0341 04/01/18 0135 04/02/18 0628  NA 136 135  --  136 137 137 134* 132*  K 4.7 4.4  --  2.8* 3.5 3.4* 3.4* 3.9  CL 106 108  --  105  --  107 105 101  CO2 20* 21*  --  24  --  23 22 22     GLUCOSE 117* 84  --  125* 120* 118* 124* 135*  BUN 23 18  --  7*  --  7* 9 11  CREATININE 1.03 1.00  --  0.74  --  0.85 0.80 0.90  CALCIUM 9.2 8.6*  --  8.4*  --  8.5* 8.3* 8.4*  MG 2.0  --   --   --   --   --  1.6* 2.2  AST 92* 105* 115* 116*  --  102* 72* 74*  ALT 151* 115* 114* 102*  --  96* 77* 77*  ALKPHOS 558* 489* 713* 769*  --  776* 763* 988*  BILITOT 4.7* 5.4* 8.6* 8.5*  --  8.6* 5.6* 5.7*     Bonnielee Haff M.D   Go to [email or web address] - password [email or web address] for contact info  [facility] - Office  [phone]

## 2018-04-02 NOTE — Evaluation (Signed)
Physical Therapy Evaluation Patient Details Name: Jose [name] MRN: [id number] DOB: 16-Mar-1924 Today's Date: 04/02/2018   History of Present Illness  Pt is 84 y.o. male s/p ERCP (03/29/18), with the diagnosis of obstructive jaundice and complaints of epigastric discomfort. Recent surgical history includes laparoscopic cholecystectomy and subsequent ERCP (03/13/18) for ascending cholangitis and bacteremia. PMH significant for cervical radiculopathy with neuropathy, back problems, anxiety, dyspnea, HTN, BPH and GERD.   Clinical Impression  Pt is disoriented to situation with cognitive deficits limiting validity of information. Given he is a poor historian, pt's son and daughter-in-law Jose [name]) were called to clarify PLOF and assistance at home. PTA pt was living at home, ambulating with RW, with [facility] services 2x/week. Pt presents with bilateral LE weakness, decreased awareness of safety and deficits, and reports history of fall 2 weeks prior. Pt requires MinA for management of RW with mobility. Skilled therapy is necessary to address strength and balance deficits, and improve function and mobility with daily tasks to improve safety around the home. PT recommending HHPT with 24 hr supervision. Daughter-in-law reports family will be able to stay with him 24/7. Family preference is for resumption of HHPT/OT services with [facility]. PT will continue to follow acutely.    Follow Up Recommendations Home health PT;Supervision/Assistance - 24 hour    Equipment Recommendations  None recommended by PT       Precautions / Restrictions Precautions Precautions: Fall Restrictions Weight Bearing Restrictions: No      Mobility  Transfers Overall transfer level: Needs assistance Equipment used: Rolling walker (2 wheeled) Transfers: Sit to/from Stand Sit to Stand: Min assist         General transfer comment: MinA for keeping RW on ground while completing sit to stand. Pt moved  impulsively to stand, improved with vc to slow down.   Ambulation/Gait Ambulation/Gait assistance: Min guard;Min assist   Assistive device: Rolling walker (2 wheeled) Gait Pattern/deviations: Step-through pattern;Trunk flexed   Gait velocity interpretation: 1.31 - 2.62 ft/sec, indicative of limited community ambulator General Gait Details: Pt ambulating hands on min guard for safety, minA for management of RW at start of gait. Cuing needed to stay within RW which also helped improved trunk flexion.     Balance Overall balance assessment: Needs assistance Sitting-balance support: Feet supported Sitting balance-Leahy Scale: Good     Standing balance support: Bilateral upper extremity supported Standing balance-Leahy Scale: Fair Standing balance comment: pt able to weight shift during standing marches with use of UE and no LOB                             Pertinent Vitals/Pain Pain Assessment: No/denies pain Pain Intervention(s): Limited activity within patient's tolerance;Monitored during session    [place] expects to be discharged to:: Private residence Living Arrangements: Alone Available Help at Discharge: Friend(s);Home health;Available 24 hours/day Type of Home: House Home Access: Ramped entrance     Home Layout: One level Home Equipment: Walker - 2 wheels Additional Comments: pt is poor historian limited by cognitive/hearing deficits    Prior Function Level of Independence: Needs assistance   Gait / Transfers Assistance Needed: walks with RW  ADL's / Homemaking Assistance Needed: family assists with dressing, bathing and cooking           Extremity/Trunk Assessment   Upper Extremity Assessment Upper Extremity Assessment: Defer to OT evaluation    Lower Extremity Assessment Lower Extremity Assessment: RLE deficits/detail;LLE deficits/detail;Difficult to  assess due to impaired cognition RLE Deficits / Details: weakness; MMT  deficits as follows: Hip flexion: 3+, Knee Extension: 4-/5 RLE Sensation: WNL(validity limited by cognitive and hearing impairments) LLE Deficits / Details: weakness; MMT deficits as follows: Hip flexion: 4-/5, Knee Extension: 4-/5 LLE Sensation: WNL(validity limited by cognitive and hearing impairments)    Cervical / Trunk Assessment Cervical / Trunk Assessment: Kyphotic  Communication   Communication: HOH  Cognition Arousal/Alertness: Awake/alert Behavior During Therapy: Flat affect Overall Cognitive Status: Impaired/Different from baseline Area of Impairment: Orientation;Memory;Following commands;Safety/judgement;Awareness                 Orientation Level: Disoriented to;Situation     Following Commands: Follows one step commands consistently;Follows multi-step commands inconsistently Safety/Judgement: Decreased awareness of safety;Decreased awareness of deficits Awareness: Intellectual   General Comments: Pt shows decreased safety awareness with eagerness to move quickly, is disoriented to situation, and unaware of deficits. Spoke with daughter-in-law for clarification on home living situation, with differences noted in his recall of PLOF and assistance at home.      General Comments General comments (skin integrity, edema, etc.): Spoke with pt's daughter-in-law at conclusion of session to determine available assistance at home when DC. Pt HOH, responding better when speaking to him on his R side. Pt reported no pain throughout session. Educated on LE weakness and importance of therapy for fall prevention/safety at home.    Exercises General Exercises - Lower Extremity Long Arc Quad: AROM;Both;10 reps;Seated;Strengthening Hip Flexion/Marching: AROM;Strengthening;Both;10 reps;Seated;Standing(10 reps seated; 10 standing)   Assessment/Plan    PT Assessment Patient needs continued PT services  PT Problem List Decreased strength;Decreased activity tolerance;Decreased  balance;Decreased mobility;Decreased coordination;Decreased cognition;Decreased knowledge of use of DME;Decreased safety awareness       PT Treatment Interventions DME instruction;Gait training;Functional mobility training;Therapeutic activities;Therapeutic exercise;Balance training;Neuromuscular re-education;Cognitive remediation    PT Goals (Current goals can be found in the Care Plan section)  Acute Rehab PT Goals Patient Stated Goal: go home PT Goal Formulation: With patient Time For Goal Achievement: 04/16/18 Potential to Achieve Goals: Good    Frequency Min 3X/week    AM-PAC PT "6 Clicks" Daily Activity  Outcome Measure Difficulty turning over in bed (including adjusting bedclothes, sheets and blankets)?: None Difficulty moving from lying on back to sitting on the side of the bed? : A Little Difficulty sitting down on and standing up from a chair with arms (e.g., wheelchair, bedside commode, etc,.)?: Unable Help needed moving to and from a bed to chair (including a wheelchair)?: A Little Help needed walking in hospital room?: A Little Help needed climbing 3-5 steps with a railing? : A Lot 6 Click Score: 16    End of Session Equipment Utilized During Treatment: Gait belt Activity Tolerance: Patient tolerated treatment well Patient left: in chair;with chair alarm set;with call bell/phone within reach;Other (comment)(chair belt used instead of chair pad) Nurse Communication: Mobility status PT Visit Diagnosis: Unsteadiness on feet (R26.81);Muscle weakness (generalized) (M62.81);History of falling (Z91.81)    Time: 2956-2130 PT Time Calculation (min) (ACUTE ONLY): 31 min   Charges:   PT Evaluation $PT Eval Moderate Complexity: 1 Mod PT Treatments $Gait Training: 8-22 mins        Vernell Morgans, SPT Acute Rehabilitation Services Office [phone]   Vernell Morgans 04/02/2018, 1:47 PM

## 2018-04-03 ENCOUNTER — Telehealth: Payer: Self-pay | Admitting: Internal Medicine

## 2018-04-03 LAB — COMPREHENSIVE METABOLIC PANEL
ALBUMIN: 2.1 g/dL — AB (ref 3.5–5.0)
ALK PHOS: 1042 U/L — AB (ref 38–126)
ALT: 66 U/L — ABNORMAL HIGH (ref 0–44)
ANION GAP: 9 (ref 5–15)
AST: 68 U/L — ABNORMAL HIGH (ref 15–41)
BILIRUBIN TOTAL: 4.8 mg/dL — AB (ref 0.3–1.2)
BUN: 10 mg/dL (ref 8–23)
CALCIUM: 8.2 mg/dL — AB (ref 8.9–10.3)
CO2: 23 mmol/L (ref 22–32)
Chloride: 100 mmol/L (ref 98–111)
Creatinine, Ser: 0.98 mg/dL (ref 0.61–1.24)
GLUCOSE: 142 mg/dL — AB (ref 70–99)
POTASSIUM: 3.6 mmol/L (ref 3.5–5.1)
Sodium: 132 mmol/L — ABNORMAL LOW (ref 135–145)
TOTAL PROTEIN: 5.7 g/dL — AB (ref 6.5–8.1)

## 2018-04-03 LAB — CBC
HEMATOCRIT: 26.9 % — AB (ref 39.0–52.0)
HEMOGLOBIN: 8.7 g/dL — AB (ref 13.0–17.0)
MCH: 31.5 pg (ref 26.0–34.0)
MCHC: 32.3 g/dL (ref 30.0–36.0)
MCV: 97.5 fL (ref 80.0–100.0)
Platelets: 145 10*3/uL — ABNORMAL LOW (ref 150–400)
RBC: 2.76 MIL/uL — ABNORMAL LOW (ref 4.22–5.81)
RDW: 16.5 % — AB (ref 11.5–15.5)
WBC: 7.6 10*3/uL (ref 4.0–10.5)
nRBC: 0 % (ref 0.0–0.2)

## 2018-04-03 LAB — CULTURE, BLOOD (ROUTINE X 2): CULTURE: NO GROWTH

## 2018-04-03 NOTE — Telephone Encounter (Signed)
Dr Silvio Pate said to put him in the 12:15 slot on Wednesday and he is already aware he needs to do his labs.

## 2018-04-03 NOTE — Telephone Encounter (Signed)
Left message letting [name] know appointment is wed @ 12:15

## 2018-04-03 NOTE — Discharge Summary (Signed)
[facility]  Physician Discharge Summary   Patient ID: Jose [name] MRN: [id number] DOB/AGE: 82-Dec-1925 82 y.o.  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 Outpatient follow-up next week with PCP or GI for LFTs   DISCHARGE CONDITION: fair  Diet recommendation: As before  Filed Weights   04/01/18 0500 04/02/18 0500 04/03/18 0500  Weight: 63.4 kg 63.5 kg 63.5 kg    INITIAL HISTORY: 84 y.o.malepresented with complaints of epigastric discomfort and abnormal lab showing elevated liver enzymes,status post recent lap chole with ERCP that identified a stricture,status post biliary stenting on 03/12/2018,discharged on Levaquin on 03/17/2018.Readmitted on 03/28/2018 with persistent abdominal pain and elevated LFTs with a T bili of 4.7,with CT abdomen and clinical exam consistent with acute pancreatitis.  Concern was for cholangitis as well.   Consultations:  Dr. Watt Climes with [facility] gastroenterology  Procedures: ERCP by Dr. Therisa Doyne on 03/29/2018 with removal of plastic biliary stent ERCP by Dr. 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 Patient underwent "incomplete"  ERCP on 03/29/2018 with retrieval of plastic stent. Subsequent Repeat ERCP 03/30/18 with placement of covered biliary metal stent.  Patient's AST ALT are stable.  Bilirubin is improved this morning.  Alkaline phosphatase noted to be higher.  Gastroenterology feels that the antibiotics could be responsible for this. There was concern for possible ascending cholangitis.  Patient was started on ceftriaxone.  Cultures have  been negative.  Patient was transitioned to Augmentin. We will discontinue the antibiotics per GI recommendations.  He has received them for 6 days.  There is no evidence for infection currently.  Cleared by gastroenterology for discharge.  Patient to undergo LFT check next week and Dr. Watt Climes will follow-up.    Essential hypertension  Stable.  Continue home medications.  Recent ESBL Bacteremia Patient was treated with Meropenem as inpatient at [facility] and discharged home on Levaquin which he completed on 82/11/2017.  Repeat cultures negative so far.  BPH with LUTS Continue Flomax.   Normocytic anemia  Drop in hemoglobin is likely dilutional.  No evidence of overt bleeding.  Continue with iron supplements.  Hypokalemia and hypomagnesemia Potassium and magnesium were repleted  Mild hyponatremia Sodium level is stable.  Discussed with the patient's daughter-in-law.  Patient remains asymptomatic.  Okay for discharge home today.    PERTINENT LABS:  The results of significant diagnostics from this hospitalization (including imaging, microbiology, ancillary and laboratory) are listed below for reference.    Microbiology: Recent Results (from the past 240 hour(s))  Culture, blood (routine x 2)     Status: None   Collection Time: 03/28/18 12:40 AM  Result Value Ref Range Status   Specimen Description BLOOD RIGHT ARM  Final   Special Requests   Final    BOTTLES DRAWN AEROBIC AND ANAEROBIC Blood Culture results may not be optimal due to an excessive volume of blood received in culture bottles   Culture   Final    NO GROWTH 5 DAYS Performed at [facility], [place] [address], [place], [place] [address]    Report Status 04/02/2018 FINAL  Final  Culture, blood (routine x 2)     Status: None   Collection Time: 03/28/18  1:00 AM  Result Value Ref Range Status   Specimen Description BLOOD RIGHT HAND  Final   Special Requests   Final    BOTTLES DRAWN AEROBIC ONLY  Blood Culture results may not be optimal due to an excessive volume of blood received in culture bottles   Culture   Final    NO GROWTH 5 DAYS Performed at [facility] [address], [place], [place] [address]    Report Status 04/02/2018 FINAL  Final  MRSA PCR Screening     Status: None   Collection Time: 03/29/18  7:37 AM  Result Value Ref Range Status   MRSA by PCR NEGATIVE NEGATIVE Final    Comment:        The GeneXpert MRSA Assay (FDA approved for NASAL specimens only), is one component of a comprehensive MRSA colonization surveillance program. It is not intended to diagnose MRSA infection nor to guide or monitor treatment for MRSA infections. Performed at [facility], [place] [address], [place], [place] [address]   Culture, blood (Routine X 2) w Reflex to ID Panel     Status: None   Collection Time: 03/29/18  6:54 PM  Result Value Ref Range Status   Specimen Description BLOOD BLOOD LEFT HAND  Final   Special Requests   Final    BOTTLES DRAWN AEROBIC ONLY Blood Culture results may not be optimal due to an inadequate volume of blood received in culture bottles   Culture   Final    NO GROWTH 5 DAYS Performed at [facility], [place] [address], [place], [place] [address]    Report Status 04/03/2018 FINAL  Final     Labs: Basic Metabolic Panel: Recent Labs  Lab 03/27/18 1854  03/30/18 0150 03/30/18 1225 03/31/18 0341 04/01/18 0135 04/02/18 0628 04/03/18 0304  NA 136   < > 136 137 137 134* 132* 132*  K 4.7   < > 2.8* 3.5 3.4* 3.4* 3.9 3.6  CL 106   < > 105  --  107 105 101 100  CO2 20*   < > 24  --  23 22 22 23   GLUCOSE 117*   < > 125* 120* 118* 124* 135* 142*  BUN 23   < > 7*  --  7* 9 11 10   CREATININE 1.03   < > 0.74  --  0.85 0.80 0.90 0.98  CALCIUM 9.2   < > 8.4*  --  8.5* 8.3* 8.4* 8.2*  MG 2.0  --   --   --   --  1.6* 2.2  --    < > = values in this interval not displayed.   Liver Function Tests: Recent Labs  Lab 03/30/18 0150  03/31/18 0341 04/01/18 0135 04/02/18 0628 04/03/18 0304  AST 116* 102* 72* 74* 68*  ALT 102* 96* 77* 77* 66*  ALKPHOS 769* 776* 763* 988* 1,042*  BILITOT 8.5* 8.6* 5.6* 5.7* 4.8*  PROT 5.2* 5.3* 5.3* 6.0* 5.7*  ALBUMIN 2.3* 2.2* 2.1* 2.3* 2.1*   Recent Labs  Lab 03/28/18 0425  LIPASE 29   Recent Labs  Lab 03/27/18 1854  AMMONIA 37*   CBC: Recent Labs  Lab 03/27/18 1854 03/28/18 0425 03/30/18 0150 03/30/18 1225 03/31/18 0341 04/01/18 0135 04/03/18 0304  WBC 9.6 7.9 7.5  --  8.6 7.8 7.6  NEUTROABS 7.5 5.7  --   --  6.6  --   --   HGB 10.2* 9.0* 9.0* 9.2* 8.9* 8.6* 8.7*  HCT  33.1* 29.0* 27.0* 27.0* 28.2* 27.3* 26.9*  MCV 104.1* 99.7 94.7  --  97.9 97.5 97.5  PLT 177 165 131*  --  119* 133* 145*    CBG: Recent Labs  Lab 03/29/18 0727 03/30/18 0821 03/31/18 0831 04/01/18 0809 04/02/18 0818  GLUCAP 146* 122* 109* 114* 118*     IMAGING STUDIES Ct Abdomen Pelvis W Contrast  Result Date: 03/27/2018 CLINICAL DATA:  Acute onset of generalized weakness. Elevated bilirubin. EXAM: CT ABDOMEN AND PELVIS WITH CONTRAST TECHNIQUE: Multidetector CT imaging of the abdomen and pelvis was performed using the standard protocol following bolus administration of intravenous contrast. CONTRAST:  179mL OMNIPAQUE IOHEXOL 300 MG/ML  SOLN COMPARISON:  CT of the abdomen and pelvis performed 03/11/2018, and MRCP performed 03/12/2018 FINDINGS: Lower chest: Mild bibasilar airspace opacities likely reflect atelectasis. Scattered coronary artery calcifications are seen. Hepatobiliary: There is diffuse dilatation of the intrahepatic biliary ducts and common bile duct, as previously noted, with a common bile duct stent seen in expected position. There is also diffuse dilatation of the pancreatic duct. The liver is otherwise grossly unremarkable in appearance. The patient is status post cholecystectomy, with clips noted at the gallbladder fossa. Pancreas: Diffuse soft tissue inflammation about the  pancreas is compatible with pancreatitis, as previously noted, with slightly decreased enhancement noted at the uncinate process. No definite pseudocyst formation is noted. Spleen: The spleen is unremarkable in appearance. Adrenals/Urinary Tract: The adrenal glands are unremarkable in appearance. Small left renal cysts are noted. There is no evidence of hydronephrosis. No renal or ureteral stones are identified. No perinephric stranding is seen. Stomach/Bowel: The stomach is unremarkable in appearance. The small bowel is within normal limits. The appendix is normal in caliber, without evidence of appendicitis. The colon is unremarkable in appearance. Vascular/Lymphatic: Scattered calcification is seen along the abdominal aorta and its branches. The abdominal aorta is otherwise grossly unremarkable. The inferior vena cava is grossly unremarkable. No retroperitoneal lymphadenopathy is seen. No pelvic sidewall lymphadenopathy is identified. Reproductive: The bladder is mildly distended and grossly unremarkable. The prostate is borderline normal in size. Other: No additional soft tissue abnormalities are seen. Musculoskeletal: No acute osseous abnormalities are identified. Multilevel vacuum phenomenon is noted along the lower thoracic and lumbar spine, with mild underlying facet disease. The visualized musculature is unremarkable in appearance. IMPRESSION: 1. Diffuse soft tissue inflammation about the pancreas is compatible with acute pancreatitis, as previously noted, with slightly decreased enhancement noted at the uncinate process. No definite pseudocyst formation seen. 2. Diffuse dilatation of the intrahepatic biliary ducts and common bile duct, as previously noted, with a common bile duct stent seen in expected position. Diffuse dilatation of the pancreatic duct. 3. Mild bibasilar airspace opacities likely reflect atelectasis. 4. Scattered coronary artery calcifications seen. 5. Small left renal cysts noted. 6.  Mild degenerative change along the lower thoracic and lumbar spine. Aortic Atherosclerosis (ICD10-I70.0). Electronically Signed   By: Garald Balding M.D.   On: 03/27/2018 23:24   Ct Abdomen Pelvis W Contrast  Result Date: 03/11/2018 CLINICAL DATA:  Increased weakness with generalized abdominal pain EXAM: CT ABDOMEN AND PELVIS WITH CONTRAST TECHNIQUE: Multidetector CT imaging of the abdomen and pelvis was performed using the standard protocol following bolus administration of intravenous contrast. CONTRAST:  79mL OMNIPAQUE IOHEXOL 300 MG/ML  SOLN COMPARISON:  01/04/2018, 12/27/2017 FINDINGS: Lower chest: Lung bases demonstrate hazy dependent atelectasis. Heart size within normal limits. Hepatobiliary: Intra and extrahepatic biliary dilatation status post cholecystectomy. Common bile duct diameter up to 13 mm. Pancreas:  Diffuse ductal dilatation. Hazy indistinct appearance of pancreas consistent with pancreatitis. No organized fluid collection. Spleen: Normal in size without focal abnormality. Adrenals/Urinary Tract: Adrenal glands are normal. Scarring left kidney. Cyst mid left kidney. No hydronephrosis. The bladder is normal Stomach/Bowel: Stomach is nonenlarged. No dilated small bowel. No colon wall thickening. Sigmoid colon diverticula without acute inflammatory process. Mild retained feces at the rectum. Vascular/Lymphatic: Moderate aortic atherosclerosis. No aneurysm. No significantly enlarged lymph nodes. Reproductive: Prostate calcification with slight enlargement. Other: Negative for free air or free fluid. Small fat in the left inguinal canal. Musculoskeletal: Moderate severe diffuse degenerative changes. Trace retrolisthesis L3 on L4, L2 on L3. No acute or suspicious abnormality. IMPRESSION: 1. Indistinct appearance of pancreas with mild surrounding edema/stranding, suspicious for an acute pancreatitis. No organized fluid collection. 2. Interval cholecystectomy with development of intra and  extrahepatic biliary enlargement which may be correlated with LFTs. 3. Sigmoid colon diverticular disease without acute inflammatory process Electronically Signed   By: Donavan Foil M.D.   On: 03/11/2018 23:09   Mr Abdomen Mrcp Wo Contrast  Result Date: 03/12/2018 CLINICAL DATA:  Abdominal pain.  Suspected pancreatitis. EXAM: MRI ABDOMEN WITHOUT CONTRAST  (INCLUDING MRCP) TECHNIQUE: Multiplanar multisequence MR imaging of the abdomen was performed. Heavily T2-weighted images of the biliary and pancreatic ducts were obtained, and three-dimensional MRCP images were rendered by post processing. COMPARISON:  CT scan 03/11/2018 FINDINGS: Lower chest: The lung bases are clear of an acute process. No pleural or pericardial effusion. Hepatobiliary: Moderate intra and extrahepatic biliary dilatation. The common bile duct measures up to 13 mm in the porta hepatis but narrows quickly in the pancreatic head. No focal hepatic lesions. Gallbladder is surgically absent. Pancreas: The pancreas is enlarged and inflamed consistent with acute pancreatitis. Abrupt caliber change of the pancreatic duct in the pancreatic head likely due to inflammation. No masses identified. Spleen:  Normal size.  No focal lesions. Adrenals/Urinary Tract: The adrenal glands and kidneys are grossly normal. Small left renal cysts are noted. Stomach/Bowel: Visualized portions within the abdomen are unremarkable. Vascular/Lymphatic: No pathologically enlarged lymph nodes identified. No abdominal aortic aneurysm demonstrated. Other:  No ascites or abdominal wall hernia. Musculoskeletal: No significant bony findings. IMPRESSION: 1. Enlarged and inflamed pancreas consistent with acute pancreatitis. 2. Intra and extrahepatic biliary dilatation with compression of the distal common bile duct in the head of the pancreas likely due to the surrounding pancreatitis. Similar findings involving the main pancreatic duct. 3. Status post cholecystectomy. 4. Simple  appearing renal cysts. Electronically Signed   By: Marijo Sanes M.D.   On: 03/12/2018 19:48   Mr 3d Recon At Scanner  Result Date: 03/12/2018 CLINICAL DATA:  Abdominal pain.  Suspected pancreatitis. EXAM: MRI ABDOMEN WITHOUT CONTRAST  (INCLUDING MRCP) TECHNIQUE: Multiplanar multisequence MR imaging of the abdomen was performed. Heavily T2-weighted images of the biliary and pancreatic ducts were obtained, and three-dimensional MRCP images were rendered by post processing. COMPARISON:  CT scan 03/11/2018 FINDINGS: Lower chest: The lung bases are clear of an acute process. No pleural or pericardial effusion. Hepatobiliary: Moderate intra and extrahepatic biliary dilatation. The common bile duct measures up to 13 mm in the porta hepatis but narrows quickly in the pancreatic head. No focal hepatic lesions. Gallbladder is surgically absent. Pancreas: The pancreas is enlarged and inflamed consistent with acute pancreatitis. Abrupt caliber change of the pancreatic duct in the pancreatic head likely due to inflammation. No masses identified. Spleen:  Normal size.  No focal lesions. Adrenals/Urinary Tract: The adrenal glands  and kidneys are grossly normal. Small left renal cysts are noted. Stomach/Bowel: Visualized portions within the abdomen are unremarkable. Vascular/Lymphatic: No pathologically enlarged lymph nodes identified. No abdominal aortic aneurysm demonstrated. Other:  No ascites or abdominal wall hernia. Musculoskeletal: No significant bony findings. IMPRESSION: 1. Enlarged and inflamed pancreas consistent with acute pancreatitis. 2. Intra and extrahepatic biliary dilatation with compression of the distal common bile duct in the head of the pancreas likely due to the surrounding pancreatitis. Similar findings involving the main pancreatic duct. 3. Status post cholecystectomy. 4. Simple appearing renal cysts. Electronically Signed   By: Marijo Sanes M.D.   On: 03/12/2018 19:48   Dg Chest Portable 1  View  Result Date: 03/11/2018 CLINICAL DATA:  Weakness 1 week. EXAM: PORTABLE CHEST 1 VIEW COMPARISON:  12/27/2017 FINDINGS: Lungs are adequately inflated without focal consolidation or effusion. Cardiomediastinal silhouette and remainder of the exam is unchanged. IMPRESSION: No active disease. Electronically Signed   By: Marin Olp M.D.   On: 03/11/2018 21:41   Dg Ercp  Result Date: 03/29/2018 CLINICAL DATA:  84 year old male with acute pancreatitis and biliary ductal dilatation. EXAM: ERCP TECHNIQUE: Multiple spot images obtained with the fluoroscopic device and submitted for interpretation post-procedure. FLUOROSCOPY TIME:  Fluoroscopy Time:  1 minutes 34 seconds reported COMPARISON:  None. FINDINGS: A single intraoperative saved image is submitted for review. The image demonstrates a flexible endoscope in the descending duodenum with partial wire cannulation. No cholangiogram images were submitted. Please see procedural note for further detail. IMPRESSION: ERCP as above.  Please see procedural note for further detail. These images were submitted for radiologic interpretation only. Please see the procedural report for the amount of contrast and the fluoroscopy time utilized. Electronically Signed   By: Jacqulynn Cadet M.D.   On: 03/29/2018 10:57   Dg Ercp Biliary & Pancreatic Ducts  Result Date: 03/30/2018 CLINICAL DATA:  ERCP with biliary stent placement. EXAM: ERCP TECHNIQUE: Multiple spot images obtained with the fluoroscopic device and submitted for interpretation post-procedure. FLUOROSCOPY TIME:  292 seconds COMPARISON:  ERCP-03/29/2018; 03/13/2018; CT of the abdomen and pelvis-03/27/2018 FINDINGS: Five spot intraoperative fluoroscopic images during ERCP are provided for review. Initial image demonstrates an ERCP probe overlying the right upper abdominal quadrant. There is selective cannulation and opacification of the common bile duct which appears mild to moderately dilated. There is  minimal opacification intrahepatic biliary tree which demonstrates apparent mild diffuse beaded irregularity. Subsequent images demonstrate insufflation of a balloon within the mid aspect of the CBD with subsequent presumed biliary sweeping and sphincterotomy. Despite this, there is a persistent relatively long segment narrowing involving the distal aspect of the CBD. Completion image demonstrates placement of an internal metallic biliary stent traversing the long segment narrowing of the CBD with passage of contrast to the level of the duodenum. IMPRESSION: ERCP with long segment severe narrowing of the distal aspect of the CBD with subsequent internal metallic biliary stent placement. These images were submitted for radiologic interpretation only. Please see the procedural report for the amount of contrast and the fluoroscopy time utilized. Electronically Signed   By: Sandi Mariscal M.D.   On: 03/30/2018 13:51   Dg Abd 2 Views  Result Date: 04/02/2018 CLINICAL DATA:  Evaluate biliary stent. EXAM: ABDOMEN - 2 VIEW COMPARISON:  03/30/2018 ERCP FINDINGS: A CBD stent is noted, and may be slightly more cephalad when compared to 03/30/2018 images. Bowel gas pattern is unremarkable. Cholecystectomy clips noted. No acute or suspicious bony abnormalities identified. IMPRESSION: CBD stent which  may be slightly more cephalad in position when compared to 03/30/2018 ERCP images. Electronically Signed   By: Margarette Canada M.D.   On: 04/02/2018 12:49   Dg C-arm 1-60 Min-no Report  Result Date: 03/13/2018 Fluoroscopy was utilized by the requesting physician.  No radiographic interpretation.   US Abdomen Limited Ruq  Result Date: 03/12/2018 CLINICAL DATA:  84 year old with pancreatitis. Cholecystectomy last month. Elevated LFTs and bilirubin. EXAM: ULTRASOUND ABDOMEN LIMITED RIGHT UPPER QUADRANT COMPARISON:  Abdominal CT yesterday. FINDINGS: Gallbladder: Surgically absent.  No fluid collection in the gallbladder fossa.  Common bile duct: Diameter: Dilated at 11 mm.  Distal CBD is obscured. Liver: Intrahepatic biliary ductal dilatation. No focal lesion identified. Within normal limits in parenchymal echogenicity. Portal vein is patent on color Doppler imaging with normal direction of blood flow towards the liver. IMPRESSION: 1. Intra and extrahepatic biliary ductal dilatation, seen on CT. The distal common bile duct is obscured. No choledocholithiasis were visualized. 2. Postcholecystectomy. Electronically Signed   By: Keith Rake M.D.   On: 03/12/2018 01:22    DISCHARGE EXAMINATION: Vitals:   04/02/18 1412 04/02/18 2015 04/03/18 0350 04/03/18 0500  BP: 125/71 (!) 154/80 (!) 142/73   Pulse: 72 84 78   Resp: 18 20 16    Temp:  98 F (36.7 C) 98 F (36.7 C)   TempSrc:  Oral Oral   SpO2: 99% 99% 97%   Weight:    63.5 kg   General appearance: alert, cooperative, appears stated age and no distress Resp: clear to auscultation bilaterally Cardio: regular rate and rhythm, S1, S2 normal, no murmur, click, rub or gallop GI: soft, non-tender; bowel sounds normal; no masses,  no organomegaly  DISPOSITION: Home  Discharge Instructions    Call MD for:  difficulty breathing, headache or visual disturbances   Complete by:  As directed    Call MD for:  extreme fatigue   Complete by:  As directed    Call MD for:  persistant dizziness or light-headedness   Complete by:  As directed    Call MD for:  persistant nausea and vomiting   Complete by:  As directed    Call MD for:  severe uncontrolled pain   Complete by:  As directed    Call MD for:  temperature >100.4   Complete by:  As directed    Diet general   Complete by:  As directed    Discharge instructions   Complete by:  As directed    Please be sure to get blood work done early next week to check your liver function tests.  And then further decisions regarding further management to be decided by Dr. Watt Climes. Stop taking the scheduled Tylenol at home.  Seek  attention immediately if you get fever or chills.  You were cared for by a hospitalist during your hospital stay. If you have any questions about your discharge medications or the care you received while you were in the hospital after you are discharged, you can call the unit and asked to speak with the hospitalist on call if the hospitalist that took care of you is not available. Once you are discharged, your primary care physician will handle any further medical issues. Please note that NO REFILLS for any discharge medications will be authorized once you are discharged, as it is imperative that you return to your primary care physician (or establish a relationship with a primary care physician if you do not have one) for your aftercare needs so that  they can reassess your need for medications and monitor your lab values. If you do not have a primary care physician, you can call [phone] for a physician referral.   Increase activity slowly   Complete by:  As directed         Allergies as of 04/03/2018   No Known Allergies     Medication List    STOP taking these medications   acetaminophen 500 MG tablet Commonly known as:  TYLENOL   potassium chloride SA 20 MEQ tablet Commonly known as:  K-DUR,KLOR-CON     TAKE these medications   feeding supplement (ENSURE ENLIVE) Liqd Take 6 mLs by mouth 2 (two) times daily between meals.   ferrous sulfate 325 (65 FE) MG EC tablet Take 325 mg by mouth daily with breakfast.   gabapentin 600 MG tablet Commonly known as:  NEURONTIN TAKE 1 TABLET (600 MG TOTAL) BY MOUTH 2 (TWO) TIMES DAILY. What changed:  See the new instructions.   ketoconazole 2 % cream Commonly known as:  NIZORAL Apply 1 application topically 2 (two) times daily as needed for irritation.   metoprolol succinate 25 MG 24 hr tablet Commonly known as:  TOPROL-XL Take 1 tablet (25 mg total) by mouth daily.   multivitamin with minerals Tabs tablet Take 1 tablet by mouth  daily.   omeprazole 20 MG capsule Commonly known as:  PRILOSEC Take 1 capsule (20 mg total) by mouth daily.   tamsulosin 0.4 MG Caps capsule Commonly known as:  FLOMAX TAKE 1 CAPSULE (0.4 MG TOTAL) BY MOUTH DAILY. What changed:  See the new instructions.   triamcinolone cream 0.1 % Commonly known as:  KENALOG Apply 1 application topically 2 (two) times daily as needed. What changed:  reasons to take this   vitamin B-12 500 MCG tablet Commonly known as:  CYANOCOBALAMIN Take 500 mcg by mouth daily.        [facility] Follow up.   Contact information: [address] [place] [address] [phone]           TOTAL DISCHARGE TIME: 18 minutes  Bonnielee Haff  [facility] Pager [phone]  04/03/2018, 2:11 PM

## 2018-04-03 NOTE — Progress Notes (Signed)
Pt for discharge going home, his daughter in law at the bedside, alert and responsive given pain med this morning, discontinued the peripheral IV line, health teachings, next appointment, prescription given, also given his personal belongings, no s/s of distress noted.

## 2018-04-03 NOTE — Care Management Note (Addendum)
Case Management Note  Patient Details  Name: Fayette Hamada MRN: [id number] Date of Birth: 20-Oct-1923  Subjective/Objective:                    Action/Plan:  Discussed discharge planning with patient's daughter in law [place]  at bedside. Confirmed face sheet information. [name] and her husband will provide 24 hour supervision/ assistance.   Patient was active with [facility] prior to admission and wish to continue with [facility]. Family will transport patient home at discharge.  Expected Discharge Date:                  Expected Discharge Plan:  [place]  In-House Referral:     Discharge planning Services  CM Consult  Post Acute Care Choice:  Home Health Choice offered to:  Adult Children  DME Arranged:  N/A DME Agency:  NA  HH Arranged:  PT, OT, RN [place] Agency:  [place]  Status of Service:  In process, will continue to follow  If discussed at Long Length of Stay Meetings, dates discussed:    Additional Comments:  Marilu Favre, RN 04/03/2018, 10:11 AM

## 2018-04-03 NOTE — Telephone Encounter (Signed)
Per [place] aware

## 2018-04-03 NOTE — Progress Notes (Addendum)
Glad to see bilirubin decreased however alk phos increased and I wonder if some of that may be due to his antibiotics and possibly we should hold those or change to Cipro if no allergy to that and close outpatient follow-up of liver tests with either primary care or me should be okay and patient seen today and case discussed with his daughter as well

## 2018-04-03 NOTE — Telephone Encounter (Signed)
Best number  For  vi whitesel   (daughter) [phone]  [name] stated pt is getting ready to be discharged [facility] today.  She is wanting to schedule hospital follow up for Wednesday.  There is not 30 min appointment for me to schedule.  She also stated he needed labs done on wed for liver panel.  No orders in system. [name] would like you to call her

## 2018-04-06 ENCOUNTER — Ambulatory Visit: Payer: Medicare Other | Admitting: Internal Medicine

## 2018-04-06 ENCOUNTER — Telehealth: Payer: Self-pay | Admitting: Internal Medicine

## 2018-04-06 NOTE — Telephone Encounter (Signed)
That sounds fine

## 2018-04-06 NOTE — Telephone Encounter (Signed)
Jose [name] with [facility] called for approval for verbal orders.  Resuming PT, OT, medical social education and skill nursing. 2x for 2 weeks, 1x for 5 weeks

## 2018-04-06 NOTE — Telephone Encounter (Signed)
Verbal orders given to [employer]

## 2018-04-07 ENCOUNTER — Telehealth: Payer: Self-pay | Admitting: Internal Medicine

## 2018-04-07 NOTE — Telephone Encounter (Signed)
That is fine 

## 2018-04-07 NOTE — Telephone Encounter (Signed)
Chris/Physical Therapist [place] called office requesting verbal order to see pt 2 times a week for 4 weeks. Best cb # [phone]

## 2018-04-07 NOTE — Telephone Encounter (Signed)
Verbal orders given to Chris 

## 2018-04-08 ENCOUNTER — Encounter: Payer: Self-pay | Admitting: Internal Medicine

## 2018-04-08 ENCOUNTER — Ambulatory Visit (INDEPENDENT_AMBULATORY_CARE_PROVIDER_SITE_OTHER): Payer: Medicare Other | Admitting: Internal Medicine

## 2018-04-08 VITALS — BP 108/62 | HR 89 | Temp 98.2°F | Ht 67.0 in | Wt 124.0 lb

## 2018-04-08 DIAGNOSIS — K831 Obstruction of bile duct: Secondary | ICD-10-CM | POA: Diagnosis not present

## 2018-04-08 LAB — RENAL FUNCTION PANEL
Albumin: 3.2 g/dL — ABNORMAL LOW (ref 3.5–5.2)
BUN: 19 mg/dL (ref 6–23)
CALCIUM: 9 mg/dL (ref 8.4–10.5)
CO2: 27 meq/L (ref 19–32)
CREATININE: 0.73 mg/dL (ref 0.40–1.50)
Chloride: 103 mEq/L (ref 96–112)
GFR: 106.71 mL/min (ref >60.00)
GLUCOSE: 203 mg/dL — AB (ref 70–99)
Phosphorus: 3.3 mg/dL (ref 2.3–4.6)
Potassium: 4.8 mEq/L (ref 3.5–5.1)
Sodium: 138 mEq/L (ref 135–145)

## 2018-04-08 LAB — HEPATIC FUNCTION PANEL
ALT: 47 U/L (ref 0–53)
AST: 39 U/L — ABNORMAL HIGH (ref 0–37)
Albumin: 3.2 g/dL — ABNORMAL LOW (ref 3.5–5.2)
Alkaline Phosphatase: 711 U/L — ABNORMAL HIGH (ref 39–117)
Bilirubin, Direct: 1 mg/dL — ABNORMAL HIGH (ref 0.0–0.3)
Total Bilirubin: 2.3 mg/dL — ABNORMAL HIGH (ref 0.2–1.2)
Total Protein: 6.7 g/dL (ref 6.0–8.3)

## 2018-04-08 LAB — CBC
HCT: 28.8 % — ABNORMAL LOW (ref 39.0–52.0)
Hemoglobin: 9.7 g/dL — ABNORMAL LOW (ref 13.0–17.0)
MCHC: 33.6 g/dL (ref 30.0–36.0)
MCV: 100.2 fl — AB (ref 78.0–100.0)
PLATELETS: 181 10*3/uL (ref 150.0–400.0)
RBC: 2.88 Mil/uL — ABNORMAL LOW (ref 4.22–5.81)
RDW: 17.6 % — AB (ref 11.5–15.5)
WBC: 8.5 10*3/uL (ref 4.0–10.5)

## 2018-04-08 NOTE — Assessment & Plan Note (Signed)
Recurred and stent replaced LFTs didn't improve in the hospital but his jaundice has improved Will recheck labs Nutritional support I would recommend keeping the stent in as long as possible

## 2018-04-08 NOTE — Progress Notes (Signed)
Subjective:    Patient ID: Jose [name], male    DOB: 1923-08-07, 84 y.o.   MRN: [id number]  HPI Here for hospital follow up Reviewed hospital records concurrently and discharge summary DIL here  Had recurrence of obstructive jaundice Repeat ERCP found broken stent---removed Unable to put new stent in then---but repeat ERCP and then able to get it in He had improved clinical status (able to eat) but LFTs did not improve Sent home  Eating fairly well for breakfast and dinner No N/V Bowels are okay  Current Outpatient Medications on File Prior to Visit  Medication Sig Dispense Refill  . feeding supplement, ENSURE ENLIVE, (ENSURE ENLIVE) LIQD Take 6 mLs by mouth 2 (two) times daily between meals. 60 Bottle 0  . ferrous sulfate 325 (65 FE) MG EC tablet Take 325 mg by mouth daily with breakfast.    . gabapentin (NEURONTIN) 600 MG tablet TAKE 1 TABLET (600 MG TOTAL) BY MOUTH 2 (TWO) TIMES DAILY. (Patient taking differently: Take 600 mg by mouth 2 (two) times daily. ) 180 tablet 3  . ketoconazole (NIZORAL) 2 % cream Apply 1 application topically 2 (two) times daily as needed for irritation. 60 g 1  . metoprolol succinate (TOPROL-XL) 25 MG 24 hr tablet Take 1 tablet (25 mg total) by mouth daily. 30 tablet 0  . Multiple Vitamin (MULTIVITAMIN WITH MINERALS) TABS tablet Take 1 tablet by mouth daily. 30 tablet 0  . tamsulosin (FLOMAX) 0.4 MG CAPS capsule TAKE 1 CAPSULE (0.4 MG TOTAL) BY MOUTH DAILY. (Patient taking differently: Take 0.4 mg by mouth daily. ) 90 capsule 3  . triamcinolone cream (KENALOG) 0.1 % Apply 1 application topically 2 (two) times daily as needed. (Patient taking differently: Apply 1 application topically 2 (two) times daily as needed (irritation). ) 30 g 1  . vitamin B-12 (CYANOCOBALAMIN) 500 MCG tablet Take 500 mcg by mouth daily.     No current facility-administered medications on file prior to visit.     No Known Allergies  Past Medical History:  Diagnosis  Date  . Anxiety   . Back problem 1960  . BPH (benign prostatic hypertrophy)   . Cervical radiculopathy    with neuropathy  . Dyspnea   . GERD (gastroesophageal reflux disease)   . Hypertension   . Hypokalemia   . Melanoma in situ ([place]) 12/2016   neck    Past Surgical History:  Procedure Laterality Date  . BILIARY STENT PLACEMENT  03/30/2018   Procedure: BILIARY STENT PLACEMENT;  Surgeon: Clarene Essex, MD;  Location: [place];  Service: Endoscopy;;  . CATARACT EXTRACTION     OD  . CHOLECYSTECTOMY N/A 01/26/2018   Procedure: LAPAROSCOPIC CHOLECYSTECTOMY;  Surgeon: Coralie Keens, MD;  Location: [place];  Service: General;  Laterality: N/A;  . ENDOSCOPIC RETROGRADE CHOLANGIOPANCREATOGRAPHY (ERCP) WITH PROPOFOL N/A 03/13/2018   Procedure: ENDOSCOPIC RETROGRADE CHOLANGIOPANCREATOGRAPHY (ERCP) WITH PROPOFOL;  Surgeon: Lucilla Lame, MD;  Location: [facility] ENDOSCOPY;  Service: Endoscopy;  Laterality: N/A;  . ERCP N/A 03/29/2018   Procedure: ENDOSCOPIC RETROGRADE CHOLANGIOPANCREATOGRAPHY (ERCP);  Surgeon: Ronnette Juniper, MD;  Location: [place];  Service: Gastroenterology;  Laterality: N/A;  . ERCP N/A 03/30/2018   Procedure: ENDOSCOPIC RETROGRADE CHOLANGIOPANCREATOGRAPHY (ERCP);  Surgeon: Clarene Essex, MD;  Location: [place];  Service: Endoscopy;  Laterality: N/A;  . IR PERC CHOLECYSTOSTOMY  12/28/2017  . LAPAROSCOPIC CHOLECYSTECTOMY  01/26/2018  . MELANOMA EXCISION  12/2016   in situ---Dr Dasher  . SPHINCTEROTOMY  03/30/2018   Procedure: SPHINCTEROTOMY;  Surgeon:  Clarene Essex, MD;  Location: [facility] ENDOSCOPY;  Service: Endoscopy;;  . STENT REMOVAL  03/29/2018   Procedure: STENT REMOVAL;  Surgeon: Ronnette Juniper, MD;  Location: [facility] ENDOSCOPY;  Service: Gastroenterology;;  . TONSILLECTOMY      Family History  Problem Relation Age of Onset  . Cancer Son   . Coronary artery disease Neg Hx   . Diabetes Neg Hx     Social History   Socioeconomic History  . Marital status: Widowed     Spouse name: Not on file  . Number of children: 2  . Years of education: Not on file  . Highest education level: Not on file  Occupational History  . Occupation: retiredOccupational psychologist [employer]    Employer: RETIRED  Social Needs  . Financial resource strain: Not on file  . Food insecurity:    Worry: Not on file    Inability: Not on file  . Transportation needs:    Medical: Not on file    Non-medical: Not on file  Tobacco Use  . Smoking status: Never Smoker  . Smokeless tobacco: Never Used  Substance and Sexual Activity  . Alcohol use: No  . Drug use: No  . Sexual activity: Not on file  Lifestyle  . Physical activity:    Days per week: Not on file    Minutes per session: Not on file  . Stress: Not on file  Relationships  . Social connections:    Talks on phone: Patient refused    Gets together: Patient refused    Attends religious service: Patient refused    Active member of club or organization: Patient refused    Attends meetings of clubs or organizations: Patient refused    Relationship status: Patient refused  . Intimate partner violence:    Fear of current or ex partner: Patient refused    Emotionally abused: Patient refused    Physically abused: Patient refused    Forced sexual activity: Patient refused  Other Topics Concern  . Not on file  Social History Narrative   Has living will   Son Mikeal Hawthorne is health care POA.     Requests DNR--done 05/07/16   No feeding tube if cognitively unaware   Review of Systems Feels lightheadedness when up Weight is back down some No breathing problems Rare cough    Objective:   Physical Exam  Constitutional: He appears well-developed. No distress.  Neck: No thyromegaly present.  Cardiovascular: Normal rate, regular rhythm and normal heart sounds. Exam reveals no gallop.  No murmur heard. Respiratory: Effort normal and breath sounds normal. No respiratory distress. He has no wheezes. He has no rales.  GI: Soft. Bowel sounds are  normal. He exhibits no distension. There is no tenderness. There is no rebound and no guarding.  Musculoskeletal: He exhibits no edema.  Lymphadenopathy:    He has no cervical adenopathy.  Psychiatric:  Frustrated but not depressed           Assessment & Plan:

## 2018-04-13 ENCOUNTER — Ambulatory Visit: Payer: Medicare Other | Admitting: Gastroenterology

## 2018-04-14 ENCOUNTER — Ambulatory Visit: Payer: Medicare Other | Admitting: Gastroenterology

## 2018-04-14 ENCOUNTER — Encounter

## 2018-04-19 ENCOUNTER — Inpatient Hospital Stay (HOSPITAL_COMMUNITY)
Admission: EM | Admit: 2018-04-19 | Discharge: 2018-04-22 | DRG: 871 | Disposition: A | Discharge status: Home Health | Payer: Medicare Other | Attending: Internal Medicine | Admitting: Internal Medicine

## 2018-04-19 ENCOUNTER — Encounter (HOSPITAL_COMMUNITY): Payer: Self-pay | Admitting: Emergency Medicine

## 2018-04-19 ENCOUNTER — Emergency Department (HOSPITAL_COMMUNITY): Payer: Medicare Other

## 2018-04-19 DIAGNOSIS — Z8673 Personal history of transient ischemic attack (TIA), and cerebral infarction without residual deficits: Secondary | ICD-10-CM

## 2018-04-19 DIAGNOSIS — D696 Thrombocytopenia, unspecified: Secondary | ICD-10-CM | POA: Diagnosis present

## 2018-04-19 DIAGNOSIS — I700 Atherosclerosis of aorta: Secondary | ICD-10-CM | POA: Diagnosis present

## 2018-04-19 DIAGNOSIS — G9340 Encephalopathy, unspecified: Secondary | ICD-10-CM

## 2018-04-19 DIAGNOSIS — R748 Abnormal levels of other serum enzymes: Secondary | ICD-10-CM | POA: Diagnosis not present

## 2018-04-19 DIAGNOSIS — I10 Essential (primary) hypertension: Secondary | ICD-10-CM | POA: Diagnosis present

## 2018-04-19 DIAGNOSIS — Z86006 Personal history of melanoma in-situ: Secondary | ICD-10-CM

## 2018-04-19 DIAGNOSIS — Z79899 Other long term (current) drug therapy: Secondary | ICD-10-CM

## 2018-04-19 DIAGNOSIS — J189 Pneumonia, unspecified organism: Secondary | ICD-10-CM | POA: Diagnosis present

## 2018-04-19 DIAGNOSIS — A419 Sepsis, unspecified organism: Principal | ICD-10-CM | POA: Diagnosis present

## 2018-04-19 DIAGNOSIS — J181 Lobar pneumonia, unspecified organism: Secondary | ICD-10-CM | POA: Diagnosis present

## 2018-04-19 DIAGNOSIS — K5730 Diverticulosis of large intestine without perforation or abscess without bleeding: Secondary | ICD-10-CM | POA: Diagnosis present

## 2018-04-19 DIAGNOSIS — N400 Enlarged prostate without lower urinary tract symptoms: Secondary | ICD-10-CM | POA: Diagnosis present

## 2018-04-19 DIAGNOSIS — E876 Hypokalemia: Secondary | ICD-10-CM | POA: Diagnosis present

## 2018-04-19 DIAGNOSIS — Z66 Do not resuscitate: Secondary | ICD-10-CM | POA: Diagnosis present

## 2018-04-19 DIAGNOSIS — N281 Cyst of kidney, acquired: Secondary | ICD-10-CM | POA: Diagnosis present

## 2018-04-19 DIAGNOSIS — I2510 Atherosclerotic heart disease of native coronary artery without angina pectoris: Secondary | ICD-10-CM | POA: Diagnosis present

## 2018-04-19 DIAGNOSIS — R17 Unspecified jaundice: Secondary | ICD-10-CM | POA: Diagnosis present

## 2018-04-19 DIAGNOSIS — Z960 Presence of urogenital implants: Secondary | ICD-10-CM | POA: Diagnosis present

## 2018-04-19 DIAGNOSIS — G9341 Metabolic encephalopathy: Secondary | ICD-10-CM | POA: Diagnosis present

## 2018-04-19 DIAGNOSIS — D649 Anemia, unspecified: Secondary | ICD-10-CM | POA: Diagnosis present

## 2018-04-19 DIAGNOSIS — R6521 Severe sepsis with septic shock: Secondary | ICD-10-CM | POA: Diagnosis not present

## 2018-04-19 DIAGNOSIS — R6520 Severe sepsis without septic shock: Secondary | ICD-10-CM | POA: Diagnosis not present

## 2018-04-19 DIAGNOSIS — K219 Gastro-esophageal reflux disease without esophagitis: Secondary | ICD-10-CM | POA: Diagnosis present

## 2018-04-19 DIAGNOSIS — J9811 Atelectasis: Secondary | ICD-10-CM | POA: Diagnosis present

## 2018-04-19 DIAGNOSIS — K859 Acute pancreatitis without necrosis or infection, unspecified: Secondary | ICD-10-CM | POA: Diagnosis present

## 2018-04-19 DIAGNOSIS — J9600 Acute respiratory failure, unspecified whether with hypoxia or hypercapnia: Secondary | ICD-10-CM | POA: Diagnosis not present

## 2018-04-19 DIAGNOSIS — E440 Moderate protein-calorie malnutrition: Secondary | ICD-10-CM | POA: Diagnosis not present

## 2018-04-19 DIAGNOSIS — M47816 Spondylosis without myelopathy or radiculopathy, lumbar region: Secondary | ICD-10-CM | POA: Diagnosis present

## 2018-04-19 DIAGNOSIS — R32 Unspecified urinary incontinence: Secondary | ICD-10-CM | POA: Diagnosis present

## 2018-04-19 DIAGNOSIS — Z9049 Acquired absence of other specified parts of digestive tract: Secondary | ICD-10-CM

## 2018-04-19 LAB — I-STAT CG4 LACTIC ACID, ED
Lactic Acid, Venous: 3.93 mmol/L (ref 0.5–1.9)
Lactic Acid, Venous: 3.48 mmol/L (ref 0.5–1.9)

## 2018-04-19 LAB — COMPREHENSIVE METABOLIC PANEL
ALT: 38 U/L (ref 0–44)
AST: 39 U/L (ref 15–41)
Albumin: 3.1 g/dL — ABNORMAL LOW (ref 3.5–5.0)
Alkaline Phosphatase: 266 U/L — ABNORMAL HIGH (ref 38–126)
Anion gap: 13 (ref 5–15)
BUN: 17 mg/dL (ref 8–23)
CO2: 21 mmol/L — ABNORMAL LOW (ref 22–32)
Calcium: 8.7 mg/dL — ABNORMAL LOW (ref 8.9–10.3)
Chloride: 99 mmol/L (ref 98–111)
Creatinine, Ser: 1.07 mg/dL (ref 0.61–1.24)
GFR calc Af Amer: >60 mL/min (ref >60)
GFR calc non Af Amer: 60 mL/min — ABNORMAL LOW (ref >60)
Glucose, Bld: 145 mg/dL — ABNORMAL HIGH (ref 70–99)
POTASSIUM: 3.4 mmol/L — AB (ref 3.5–5.1)
Sodium: 133 mmol/L — ABNORMAL LOW (ref 135–145)
Total Bilirubin: 1.9 mg/dL — ABNORMAL HIGH (ref 0.3–1.2)
Total Protein: 6.9 g/dL (ref 6.5–8.1)

## 2018-04-19 LAB — CBC
HCT: 32.4 % — ABNORMAL LOW (ref 39.0–52.0)
Hemoglobin: 10.1 g/dL — ABNORMAL LOW (ref 13.0–17.0)
MCH: 32.4 pg (ref 26.0–34.0)
MCHC: 31.2 g/dL (ref 30.0–36.0)
MCV: 103.8 fL — ABNORMAL HIGH (ref 80.0–100.0)
PLATELETS: 165 10*3/uL (ref 150–400)
RBC: 3.12 MIL/uL — ABNORMAL LOW (ref 4.22–5.81)
RDW: 17.2 % — ABNORMAL HIGH (ref 11.5–15.5)
WBC: 11.4 10*3/uL — ABNORMAL HIGH (ref 4.0–10.5)
nRBC: 0 % (ref 0.0–0.2)

## 2018-04-19 LAB — DIFFERENTIAL
Abs Immature Granulocytes: 0.06 10*3/uL (ref 0.00–0.07)
Basophils Absolute: 0 10*3/uL (ref 0.0–0.1)
Basophils Relative: 0 %
EOS ABS: 0 10*3/uL (ref 0.0–0.5)
Eosinophils Relative: 0 %
Immature Granulocytes: 1 %
Lymphocytes Relative: 3 %
Lymphs Abs: 0.3 10*3/uL — ABNORMAL LOW (ref 0.7–4.0)
MONO ABS: 0.4 10*3/uL (ref 0.1–1.0)
Monocytes Relative: 4 %
Neutro Abs: 10.5 10*3/uL — ABNORMAL HIGH (ref 1.7–7.7)
Neutrophils Relative %: 92 %

## 2018-04-19 LAB — URINALYSIS, ROUTINE W REFLEX MICROSCOPIC
Bilirubin Urine: NEGATIVE
Glucose, UA: NEGATIVE mg/dL
Hgb urine dipstick: NEGATIVE
Ketones, ur: NEGATIVE mg/dL
Leukocytes, UA: NEGATIVE
Nitrite: NEGATIVE
Protein, ur: NEGATIVE mg/dL
Specific Gravity, Urine: 1.016 (ref 1.005–1.030)
pH: 5 (ref 5.0–8.0)

## 2018-04-19 LAB — LIPASE, BLOOD: Lipase: 36 U/L (ref 11–51)

## 2018-04-19 LAB — I-STAT TROPONIN, ED: Troponin i, poc: 0.03 ng/mL (ref 0.00–0.08)

## 2018-04-19 LAB — PROTIME-INR
INR: 1.31
Prothrombin Time: 16.2 seconds — ABNORMAL HIGH (ref 11.4–15.2)

## 2018-04-19 LAB — ETHANOL: Alcohol, Ethyl (B): <10 mg/dL (ref <10)

## 2018-04-19 LAB — APTT: aPTT: 30 seconds (ref 24–36)

## 2018-04-19 MED ORDER — ACETAMINOPHEN 325 MG PO TABS
650.0000 mg | ORAL_TABLET | Freq: Once | ORAL | Status: AC
  Administered 2018-04-19: 650 mg via ORAL
  Filled 2018-04-19: qty 2

## 2018-04-19 MED ORDER — SODIUM CHLORIDE 0.9 % IV SOLN
1.0000 g | Freq: Two times a day (BID) | INTRAVENOUS | Status: DC
  Administered 2018-04-20 – 2018-04-22 (×6): 1 g via INTRAVENOUS
  Filled 2018-04-19 (×7): qty 1

## 2018-04-19 MED ORDER — SODIUM CHLORIDE 0.9 % IV BOLUS
30.0000 mL/kg | Freq: Once | INTRAVENOUS | Status: AC
  Administered 2018-04-19: 1686 mL via INTRAVENOUS

## 2018-04-19 MED ORDER — SODIUM CHLORIDE 0.9 % IV BOLUS
1000.0000 mL | Freq: Once | INTRAVENOUS | Status: AC
  Administered 2018-04-19: 1000 mL via INTRAVENOUS

## 2018-04-19 MED ORDER — SODIUM CHLORIDE 0.9 % IV BOLUS
500.0000 mL | Freq: Once | INTRAVENOUS | Status: AC
  Administered 2018-04-19: 500 mL via INTRAVENOUS

## 2018-04-19 MED ORDER — IOHEXOL 300 MG/ML  SOLN
100.0000 mL | Freq: Once | INTRAMUSCULAR | Status: AC | PRN
  Administered 2018-04-19: 100 mL via INTRAVENOUS

## 2018-04-19 MED ORDER — SODIUM CHLORIDE 0.9 % IV SOLN
Freq: Once | INTRAVENOUS | Status: AC
  Administered 2018-04-19: via INTRAVENOUS

## 2018-04-19 MED ORDER — SODIUM CHLORIDE 0.9 % IV BOLUS: 500.0000 mL | Freq: Once | INTRAVENOUS | Status: DC

## 2018-04-19 MED ORDER — PHENYLEPHRINE HCL-NACL 10-0.9 MG/250ML-% IV SOLN
0.0000 ug/min | INTRAVENOUS | Status: DC
  Administered 2018-04-19: 20 ug/min via INTRAVENOUS
  Filled 2018-04-19: qty 250

## 2018-04-19 MED ORDER — PIPERACILLIN-TAZOBACTAM 3.375 G IVPB 30 MIN
3.3750 g | Freq: Once | INTRAVENOUS | Status: AC
  Administered 2018-04-19: 3.375 g via INTRAVENOUS
  Filled 2018-04-19: qty 50

## 2018-04-19 MED ORDER — VANCOMYCIN HCL IN DEXTROSE 1-5 GM/200ML-% IV SOLN
1000.0000 mg | Freq: Once | INTRAVENOUS | Status: AC
  Administered 2018-04-19: 1000 mg via INTRAVENOUS
  Filled 2018-04-19: qty 200

## 2018-04-19 MED ORDER — PIPERACILLIN-TAZOBACTAM 3.375 G IVPB: 3.3750 g | Freq: Three times a day (TID) | INTRAVENOUS | Status: DC

## 2018-04-19 MED ORDER — VANCOMYCIN HCL IN DEXTROSE 750-5 MG/150ML-% IV SOLN
750.0000 mg | INTRAVENOUS | Status: DC
  Administered 2018-04-20 – 2018-04-21 (×2): 750 mg via INTRAVENOUS
  Filled 2018-04-19 (×2): qty 150

## 2018-04-19 NOTE — ED Notes (Signed)
Patient transported to X-ray 

## 2018-04-19 NOTE — ED Provider Notes (Signed)
[facility] EMERGENCY DEPARTMENT Provider Note   CSN: [id number] Arrival date & time: 04/19/18  1815     History   Chief Complaint Chief Complaint  Patient presents with  . Fever    HPI Jose [name] is a 84 y.o. male.  Patient with past medical history of hypertension, hypokalemia, GERD, and recent cholecystectomy with multiple complications resulting in ERCP --presents with complaint of fever and confusion.  Family found the patient disoriented today about 4:00pm.  He had a bowel movement on himself and was very confused and would not not follow instructions.  He was very weak.  Family denies any facial droop or slurred speech.  His confusion is improved upon arrival to the emergency department but he is not back to baseline.  They checked his temperature and found it to be 102 F.  Patient currently does not have any complaints.  He denies chest pain or abdominal pain.  No nausea, vomiting, or diarrhea.  He is following commands upon arrival to the emergency department.     Past Medical History:  Diagnosis Date  . Anxiety   . Back problem 1960  . BPH (benign prostatic hypertrophy)   . Cervical radiculopathy    with neuropathy  . Dyspnea   . GERD (gastroesophageal reflux disease)   . Hypertension   . Hypokalemia   . Melanoma in situ [facility]) 12/2016   neck    Patient Active Problem List   Diagnosis Date Noted  . Obstructive jaundice 03/28/2018  . Obstructive jaundice 03/28/2018  . History of ESBL E. coli infection 03/28/2018  . Acute pancreatitis   . Hyperbilirubinemia   . Fatigue 03/26/2018  . E. coli sepsis ([place]) 03/24/2018  . Tachycardia 03/24/2018  . Malnutrition of moderate degree 03/14/2018  . Jaundice   . Pancreatitis due to biliary obstruction 03/12/2018  . Malnutrition of mild degree ([place]) 01/06/2018  . Elevated liver enzymes 01/04/2018  . Melanoma of neck ([place])   . Rash 09/05/2016  . Neuropathy 05/07/2016  . Anemia 05/11/2015    . Preventative health care 04/29/2014  . Advanced directives, counseling/discussion 04/29/2014  . Osteoarthrosis involving multiple sites 10/31/2010  . Osteoarthrosis of knee 10/31/2010  . ACTINIC KERATOSIS 12/04/2007  . BPH with obstruction/lower urinary tract symptoms 10/01/2006  . Essential hypertension, benign 09/17/2006  . GERD 09/17/2006  . Cervical radiculopathy 09/17/2006    Past Surgical History:  Procedure Laterality Date  . BILIARY STENT PLACEMENT  03/30/2018   Procedure: BILIARY STENT PLACEMENT;  Surgeon: Clarene Essex, MD;  Location: [place];  Service: Endoscopy;;  . CATARACT EXTRACTION     OD  . CHOLECYSTECTOMY N/A 01/26/2018   Procedure: LAPAROSCOPIC CHOLECYSTECTOMY;  Surgeon: Coralie Keens, MD;  Location: [place];  Service: General;  Laterality: N/A;  . ENDOSCOPIC RETROGRADE CHOLANGIOPANCREATOGRAPHY (ERCP) WITH PROPOFOL N/A 03/13/2018   Procedure: ENDOSCOPIC RETROGRADE CHOLANGIOPANCREATOGRAPHY (ERCP) WITH PROPOFOL;  Surgeon: Lucilla Lame, MD;  Location: [facility] ENDOSCOPY;  Service: Endoscopy;  Laterality: N/A;  . ERCP N/A 03/29/2018   Procedure: ENDOSCOPIC RETROGRADE CHOLANGIOPANCREATOGRAPHY (ERCP);  Surgeon: Ronnette Juniper, MD;  Location: [place];  Service: Gastroenterology;  Laterality: N/A;  . ERCP N/A 03/30/2018   Procedure: ENDOSCOPIC RETROGRADE CHOLANGIOPANCREATOGRAPHY (ERCP);  Surgeon: Clarene Essex, MD;  Location: [place];  Service: Endoscopy;  Laterality: N/A;  . IR PERC CHOLECYSTOSTOMY  12/28/2017  . LAPAROSCOPIC CHOLECYSTECTOMY  01/26/2018  . MELANOMA EXCISION  12/2016   in situ---Dr Dasher  . SPHINCTEROTOMY  03/30/2018   Procedure: SPHINCTEROTOMY;  Surgeon: Clarene Essex,  MD;  Location: [facility] ENDOSCOPY;  Service: Endoscopy;;  . STENT REMOVAL  03/29/2018   Procedure: STENT REMOVAL;  Surgeon: Ronnette Juniper, MD;  Location: [facility] ENDOSCOPY;  Service: Gastroenterology;;  . TONSILLECTOMY          Home Medications    Prior to Admission medications    Medication Sig Start Date End Date Taking? Authorizing Provider  feeding supplement, ENSURE ENLIVE, (ENSURE ENLIVE) LIQD Take 6 mLs by mouth 2 (two) times daily between meals. 03/17/18   Loletha Grayer, MD  ferrous sulfate 325 (65 FE) MG EC tablet Take 325 mg by mouth daily with breakfast.    [provider]  gabapentin (NEURONTIN) 600 MG tablet TAKE 1 TABLET (600 MG TOTAL) BY MOUTH 2 (TWO) TIMES DAILY. Patient taking differently: Take 600 mg by mouth 2 (two) times daily.  07/21/17   [name], MD  ketoconazole (NIZORAL) 2 % cream Apply 1 application topically 2 (two) times daily as needed for irritation. 05/07/17   [name], MD  metoprolol succinate (TOPROL-XL) 25 MG 24 hr tablet Take 1 tablet (25 mg total) by mouth daily. 03/17/18   Loletha Grayer, MD  Multiple Vitamin (MULTIVITAMIN WITH MINERALS) TABS tablet Take 1 tablet by mouth daily. 03/17/18   Loletha Grayer, MD  tamsulosin (FLOMAX) 0.4 MG CAPS capsule TAKE 1 CAPSULE (0.4 MG TOTAL) BY MOUTH DAILY. Patient taking differently: Take 0.4 mg by mouth daily.  07/21/17   [name], MD  triamcinolone cream (KENALOG) 0.1 % Apply 1 application topically 2 (two) times daily as needed. Patient taking differently: Apply 1 application topically 2 (two) times daily as needed (irritation).  09/05/16   [name], MD  vitamin B-12 (CYANOCOBALAMIN) 500 MCG tablet Take 500 mcg by mouth daily.    [provider]    Family History Family History  Problem Relation Age of Onset  . Cancer Son   . Coronary artery disease Neg Hx   . Diabetes Neg Hx     Social History Social History   Tobacco Use  . Smoking status: Never Smoker  . Smokeless tobacco: Never Used  Substance Use Topics  . Alcohol use: No  . Drug use: No     Allergies   Patient has no known allergies.   Review of Systems Review of Systems  Constitutional: Positive for fever.  HENT: Negative for rhinorrhea and sore throat.    Eyes: Negative for redness.  Respiratory: Negative for cough.   Cardiovascular: Negative for chest pain.  Gastrointestinal: Negative for abdominal pain, diarrhea, nausea and vomiting.  Genitourinary: Negative for dysuria.  Musculoskeletal: Negative for myalgias.  Skin: Negative for rash.  Neurological: Positive for weakness. Negative for headaches.  Psychiatric/Behavioral: Positive for confusion.     Physical Exam Updated Vital Signs BP 96/76 (BP Location: Right Arm)   Pulse (!) 125   Temp 99.4 F (37.4 C) (Oral)   Resp (!) 27   SpO2 95%   Physical Exam  Constitutional: He appears well-developed and well-nourished.  HENT:  Head: Normocephalic and atraumatic.  Eyes: Conjunctivae are normal. Right eye exhibits no discharge. Left eye exhibits no discharge.  Neck: Normal range of motion. Neck supple.  Cardiovascular: Regular rhythm and normal heart sounds. Tachycardia present.  Pulmonary/Chest: Effort normal and breath sounds normal. No stridor. No respiratory distress. He has no wheezes. He has no rales.  Abdominal: Soft. There is no tenderness. There is no rebound and no guarding.  Neurological: He is alert. No cranial nerve deficit.  Coordination normal.  Patient follows commands and has good strength in the upper and lower extremities.  No gross cranial nerve deficits.  Skin: Skin is warm and dry.  Psychiatric: He has a normal mood and affect.  Nursing note and vitals reviewed.    ED Treatments / Results  Labs (all labs ordered are listed, but only abnormal results are displayed) Labs Reviewed  PROTIME-INR - Abnormal; Notable for the following components:      Result Value   Prothrombin Time 16.2 (*)    All other components within normal limits  CBC - Abnormal; Notable for the following components:   WBC 11.4 (*)    RBC 3.12 (*)    Hemoglobin 10.1 (*)    HCT 32.4 (*)    MCV 103.8 (*)    RDW 17.2 (*)    All other components within normal limits  DIFFERENTIAL -  Abnormal; Notable for the following components:   Neutro Abs 10.5 (*)    Lymphs Abs 0.3 (*)    All other components within normal limits  COMPREHENSIVE METABOLIC PANEL - Abnormal; Notable for the following components:   Sodium 133 (*)    Potassium 3.4 (*)    CO2 21 (*)    Glucose, Bld 145 (*)    Calcium 8.7 (*)    Albumin 3.1 (*)    Alkaline Phosphatase 266 (*)    Total Bilirubin 1.9 (*)    GFR calc non Af Amer 60 (*)    All other components within normal limits  URINALYSIS, ROUTINE W REFLEX MICROSCOPIC - Abnormal; Notable for the following components:   Color, Urine AMBER (*)    APPearance HAZY (*)    All other components within normal limits  I-STAT CG4 LACTIC ACID, ED - Abnormal; Notable for the following components:   Lactic Acid, Venous 3.93 (*)    All other components within normal limits  URINE CULTURE  APTT  ETHANOL  LIPASE, BLOOD  I-STAT TROPONIN, ED  I-STAT CG4 LACTIC ACID, ED    EKG EKG Interpretation  Date/Time:  Sunday April 19 2018 18:30:49 EST Ventricular Rate:  124 PR Interval:    QRS Duration: 82 QT Interval:  298 QTC Calculation: 428 R Axis:   -52 Text Interpretation:  Sinus tachycardia Atrial premature complex Abnormal R-wave progression, late transition Inferior infarct, old Confirmed by Quintella Reichert [phone]) on 04/19/2018 7:39:46 PM   Radiology Dg Chest 2 View  Result Date: 04/19/2018 CLINICAL DATA:  Fever.  Disorientation. EXAM: CHEST - 2 VIEW COMPARISON:  March 11, 2018 FINDINGS: The heart size and mediastinal contours are within normal limits. Both lungs are clear. The visualized skeletal structures are unremarkable. IMPRESSION: No active cardiopulmonary disease. Electronically Signed   By: Dorise Bullion III M.D   On: 04/19/2018 19:29   Ct Head Wo Contrast  Result Date: 04/19/2018 CLINICAL DATA:  Patient found today with altered mental status and fever. EXAM: CT HEAD WITHOUT CONTRAST TECHNIQUE: Contiguous axial images were obtained  from the base of the skull through the vertex without intravenous contrast. COMPARISON:  None. FINDINGS: Brain: Ventricles and cisterns are normal. Mild prominence of the CSF spaces compatible with age related atrophic change. Minimal chronic ischemic microvascular disease. Old lacunar infarct over the right periventricular white matter. No mass, mass effect, shift of midline structures or acute hemorrhage. No evidence of acute infarction. Vascular: No hyperdense vessel or unexpected calcification. Skull: Normal. Negative for fracture or focal lesion. Sinuses/Orbits: Orbits are normal. Minimal mucosal membrane thickening over the  floor the right maxillary sinus. Other: None. IMPRESSION: No acute findings. Mild atrophy and chronic ischemic microvascular disease. Small old right periventricular lacunar infarct. Electronically Signed   By: Marin Olp M.D.   On: 04/19/2018 19:50   Ct Abdomen Pelvis W Contrast  Result Date: 04/19/2018 CLINICAL DATA:  Abdominal pain.  Sepsis. EXAM: CT ABDOMEN AND PELVIS WITH CONTRAST TECHNIQUE: Multidetector CT imaging of the abdomen and pelvis was performed using the standard protocol following bolus administration of intravenous contrast. CONTRAST:  121mL OMNIPAQUE IOHEXOL 300 MG/ML  SOLN COMPARISON:  Plain films 04/02/2018.  CT 03/27/2018. FINDINGS: Lower chest: Ground-glass airspace disease in the lower lobes bilaterally compatible with pneumonia. Heart is mildly enlarged. No effusions. Hepatobiliary: Prior cholecystectomy. There is a metallic stent within the common bile duct and pneumobilia and mild intrahepatic and extrahepatic biliary ductal dilatation. No focal hepatic abnormality. Pancreas: Pancreatic ductal dilatation is noted and stable. Again noted is an area of focal low-density within the uncinate process measuring 1.5 cm, stable. Slight haziness/stranding around the pancreas. Cannot exclude early acute pancreatitis. Spleen: No focal abnormality.  Normal size.  Adrenals/Urinary Tract: Areas of cortical thinning and scarring in the left kidney. Small bilateral renal cysts. No hydronephrosis. Adrenal glands and urinary bladder unremarkable. Stomach/Bowel: Sigmoid diverticulosis. No active diverticulitis. Normal appendix. Stomach and small bowel decompressed, unremarkable. Vascular/Lymphatic: Aortic atherosclerosis. No enlarged abdominal or pelvic lymph nodes. Reproductive: Prostate enlargement. Other: No free fluid or free air. Musculoskeletal: Degenerative changes in the lumbar spine. No acute bony abnormality. IMPRESSION: Ground-glass airspace consolidation in the lower lobes bilaterally compatible with pneumonia. Prior metallic stenting of the common bile duct and cholecystectomy. Pneumobilia and mild biliary ductal dilatation. Slight stranding around the pancreas may reflect early acute pancreatitis. Stable low-density area within the uncinate process. Sigmoid diverticulosis.  No active diverticulitis. Prostate enlargement. Electronically Signed   By: Rolm Baptise M.D.   On: 04/19/2018 22:11    Procedures Procedures (including critical care time)  Medications Ordered in ED Medications  sodium chloride 0.9 % bolus 500 mL (has no administration in time range)     Initial Impression / Assessment and Plan / ED Course  I have reviewed the triage vital signs and the nursing notes.  Pertinent labs & imaging results that were available during my care of the patient were reviewed by me and considered in my medical decision making (see chart for details).     Patient seen and examined. Work-up initiated.  Fever suggests infection of some sort causing altered mental status today.  Cannot entirely rule out CVA or TIA given acute onset of confusion and difficulty with following directions, now improving.  Gross neuro exam is reassuring.  Vital signs reviewed and are as follows: BP 96/76 (BP Location: Right Arm)   Pulse (!) 125   Temp 99.4 F (37.4 C) (Oral)    Resp (!) 27   SpO2 95%   10:30 PM UA was clear.  CT of the abdomen and pelvis ordered.  This suggests basilar pneumonia.  Family at bedside updated.  BP (!) 90/52   Pulse 90   Temp (!) 102 F (38.9 C) (Rectal)   Resp (!) 21   SpO2 92%   Spoke with Dr. Hal Hope who will see.   CRITICAL CARE Performed by: Carlisle Cater PA-C Total critical care time: 40 minutes Critical care time was exclusive of separately billable procedures and treating other patients. Critical care was necessary to treat or prevent imminent or life-threatening deterioration. Critical care was time spent  personally by me on the following activities: development of treatment plan with patient and/or surrogate as well as nursing, discussions with consultants, evaluation of patient's response to treatment, examination of patient, obtaining history from patient or surrogate, ordering and performing treatments and interventions, ordering and review of laboratory studies, ordering and review of radiographic studies, pulse oximetry and re-evaluation of patient's condition.   Final Clinical Impressions(s) / ED Diagnoses   Final diagnoses:  Sepsis with encephalopathy and septic shock, due to unspecified organism ([place])   Admit.   ED Discharge Orders    None       Carlisle Cater, Hershal Coria 04/19/18 2232    Quintella Reichert, MD 04/20/18 [phone]

## 2018-04-19 NOTE — Progress Notes (Signed)
Pharmacy Antibiotic Note  Jose [name] is a 84 y.o. male admitted on 04/19/2018 with sepsis.  Pharmacy has been consulted to change Zosyn to [facility] given recent (Oct 2019) h/o ESBL bacteremia.  Plan: Merrem 1g IV Q12H.   Temp (24hrs), Avg:100.7 F (38.2 C), Min:99.4 F (37.4 C), Max:102 F (38.9 C)  Recent Labs  Lab 04/19/18 1900 04/19/18 1911 04/19/18 2249  WBC 11.4*  --   --   CREATININE 1.07  --   --   LATICACIDVEN  --  3.93* 3.48*    Estimated Creatinine Clearance: 35 mL/min (by C-G formula based on SCr of 1.07 mg/dL).    No Known Allergies   Thank you for allowing pharmacy to be a part of this patient's care.  Wynona Neat, PharmD, BCPS  04/19/2018 11:30 PM

## 2018-04-19 NOTE — ED Notes (Signed)
[facility] notified of pts blood pressure 78/49. Orders placed for bolus. Will continue to monitor.

## 2018-04-19 NOTE — ED Triage Notes (Signed)
Pt arrives with family that reports that the pt was found disorientated today around 1600 and had fever of 102. Family reports he was soiled.

## 2018-04-19 NOTE — Progress Notes (Signed)
Pharmacy Antibiotic Note  Jose [name] is a 84 y.o. male admitted on 04/19/2018 with sepsis.  Pharmacy has been consulted for vancomycin and zosyn dosing. Pt is afebrile and WBC is mildly elevated at 11.4. Scr is slightly above baseline and lactic acid is elevated at 3.93.   Plan: Vancomycin 1gm IV x 1 then 750mg  IV Q24h Zosyn 3.375gm IV Q8H (4 hr inf) F/u renal fxn, C&S, clinical status and trough at SS     Temp (24hrs), Avg:99.4 F (37.4 C), Min:99.4 F (37.4 C), Max:99.4 F (37.4 C)  Recent Labs  Lab 04/19/18 1911  LATICACIDVEN 3.93*    Estimated Creatinine Clearance: 46.8 mL/min (by C-G formula based on SCr of 0.73 mg/dL).    No Known Allergies  Antimicrobials this admission: Vanc 12/1>> Zosyn 12/1>>  Dose adjustments this admission: N/A  Microbiology results: Pending  Thank you for allowing pharmacy to be a part of this patient's care.  Jose [name], Jose [name] 04/19/2018 7:16 PM

## 2018-04-19 NOTE — ED Notes (Signed)
Patient transported to x-ray. ?

## 2018-04-19 NOTE — ED Notes (Signed)
Paged admitting MD to inform of pressure of 66/52 while 3rd bolus infusing

## 2018-04-20 ENCOUNTER — Encounter (HOSPITAL_COMMUNITY): Payer: Self-pay | Admitting: Internal Medicine

## 2018-04-20 ENCOUNTER — Other Ambulatory Visit: Payer: Self-pay

## 2018-04-20 DIAGNOSIS — A419 Sepsis, unspecified organism: Secondary | ICD-10-CM | POA: Diagnosis present

## 2018-04-20 DIAGNOSIS — J181 Lobar pneumonia, unspecified organism: Secondary | ICD-10-CM

## 2018-04-20 DIAGNOSIS — J189 Pneumonia, unspecified organism: Secondary | ICD-10-CM

## 2018-04-20 DIAGNOSIS — G9340 Encephalopathy, unspecified: Secondary | ICD-10-CM

## 2018-04-20 DIAGNOSIS — J9600 Acute respiratory failure, unspecified whether with hypoxia or hypercapnia: Secondary | ICD-10-CM

## 2018-04-20 DIAGNOSIS — R6520 Severe sepsis without septic shock: Secondary | ICD-10-CM

## 2018-04-20 DIAGNOSIS — R6521 Severe sepsis with septic shock: Secondary | ICD-10-CM

## 2018-04-20 LAB — CBC WITH DIFFERENTIAL/PLATELET
Basophils Absolute: 0 10*3/uL (ref 0.0–0.1)
Basophils Relative: 0 %
Eosinophils Absolute: 0.1 10*3/uL (ref 0.0–0.5)
Eosinophils Relative: 1 %
HCT: 24.7 % — ABNORMAL LOW (ref 39.0–52.0)
HEMOGLOBIN: 7.8 g/dL — AB (ref 13.0–17.0)
Lymphocytes Relative: 9 %
Lymphs Abs: 1 10*3/uL (ref 0.7–4.0)
MCH: 32.4 pg (ref 26.0–34.0)
MCHC: 31.6 g/dL (ref 30.0–36.0)
MCV: 102.5 fL — ABNORMAL HIGH (ref 80.0–100.0)
Monocytes Absolute: 0.6 10*3/uL (ref 0.1–1.0)
Monocytes Relative: 6 %
Neutro Abs: 9.1 10*3/uL — ABNORMAL HIGH (ref 1.7–7.7)
Neutrophils Relative %: 84 %
Platelets: 106 10*3/uL — ABNORMAL LOW (ref 150–400)
RBC: 2.41 MIL/uL — ABNORMAL LOW (ref 4.22–5.81)
RDW: 17.3 % — ABNORMAL HIGH (ref 11.5–15.5)
WBC: 10.8 10*3/uL — ABNORMAL HIGH (ref 4.0–10.5)
nRBC: 0 % (ref 0.0–0.2)

## 2018-04-20 LAB — HEPATIC FUNCTION PANEL
ALK PHOS: 176 U/L — AB (ref 38–126)
ALT: 29 U/L (ref 0–44)
AST: 31 U/L (ref 15–41)
Albumin: 2.2 g/dL — ABNORMAL LOW (ref 3.5–5.0)
Bilirubin, Direct: 0.6 mg/dL — ABNORMAL HIGH (ref 0.0–0.2)
Indirect Bilirubin: 0.7 mg/dL (ref 0.3–0.9)
Total Bilirubin: 1.3 mg/dL — ABNORMAL HIGH (ref 0.3–1.2)
Total Protein: 5.2 g/dL — ABNORMAL LOW (ref 6.5–8.1)

## 2018-04-20 LAB — STREP PNEUMONIAE URINARY ANTIGEN: STREP PNEUMO URINARY ANTIGEN: NEGATIVE

## 2018-04-20 LAB — EXPECTORATED SPUTUM ASSESSMENT W GRAM STAIN, RFLX TO RESP C

## 2018-04-20 LAB — BASIC METABOLIC PANEL
Anion gap: 7 (ref 5–15)
BUN: 15 mg/dL (ref 8–23)
CHLORIDE: 107 mmol/L (ref 98–111)
CO2: 19 mmol/L — ABNORMAL LOW (ref 22–32)
CREATININE: 0.85 mg/dL (ref 0.61–1.24)
Calcium: 7 mg/dL — ABNORMAL LOW (ref 8.9–10.3)
GFR calc Af Amer: >60 mL/min (ref >60)
GFR calc non Af Amer: >60 mL/min (ref >60)
Glucose, Bld: 132 mg/dL — ABNORMAL HIGH (ref 70–99)
Potassium: 3.4 mmol/L — ABNORMAL LOW (ref 3.5–5.1)
Sodium: 133 mmol/L — ABNORMAL LOW (ref 135–145)

## 2018-04-20 LAB — MAGNESIUM: Magnesium: 1.4 mg/dL — ABNORMAL LOW (ref 1.7–2.4)

## 2018-04-20 LAB — CORTISOL: Cortisol, Plasma: 15.7 ug/dL

## 2018-04-20 LAB — LACTIC ACID, PLASMA: Lactic Acid, Venous: 2.3 mmol/L (ref 0.5–1.9)

## 2018-04-20 LAB — INFLUENZA PANEL BY PCR (TYPE A & B)
Influenza A By PCR: NEGATIVE
Influenza B By PCR: NEGATIVE

## 2018-04-20 LAB — PROCALCITONIN: Procalcitonin: 4.16 ng/mL

## 2018-04-20 MED ORDER — OXYCODONE HCL 5 MG PO TABS: 5.0000 mg | ORAL_TABLET | ORAL | Status: DC | PRN

## 2018-04-20 MED ORDER — TRAMADOL HCL 50 MG PO TABS
50.0000 mg | ORAL_TABLET | Freq: Four times a day (QID) | ORAL | Status: DC | PRN
  Administered 2018-04-21: 50 mg via ORAL
  Filled 2018-04-20: qty 1

## 2018-04-20 MED ORDER — ONDANSETRON HCL 4 MG/2ML IJ SOLN: 4.0000 mg | Freq: Four times a day (QID) | INTRAMUSCULAR | Status: DC | PRN

## 2018-04-20 MED ORDER — ACETAMINOPHEN 325 MG PO TABS
650.0000 mg | ORAL_TABLET | Freq: Four times a day (QID) | ORAL | Status: DC | PRN
  Administered 2018-04-21: 650 mg via ORAL
  Filled 2018-04-20: qty 2

## 2018-04-20 MED ORDER — POTASSIUM CHLORIDE 10 MEQ/100ML IV SOLN
10.0000 meq | Freq: Once | INTRAVENOUS | Status: AC
  Administered 2018-04-20: 10 meq via INTRAVENOUS
  Filled 2018-04-20: qty 100

## 2018-04-20 MED ORDER — POTASSIUM CHLORIDE 10 MEQ/100ML IV SOLN
10.0000 meq | INTRAVENOUS | Status: AC
  Administered 2018-04-20 (×3): 10 meq via INTRAVENOUS
  Filled 2018-04-20 (×3): qty 100

## 2018-04-20 MED ORDER — ENOXAPARIN SODIUM 30 MG/0.3ML ~~LOC~~ SOLN
30.0000 mg | SUBCUTANEOUS | Status: DC
  Administered 2018-04-20: 30 mg via SUBCUTANEOUS
  Filled 2018-04-20: qty 0.3

## 2018-04-20 MED ORDER — FERROUS SULFATE 325 (65 FE) MG PO TABS
325.0000 mg | ORAL_TABLET | Freq: Every day | ORAL | Status: DC
  Administered 2018-04-21 – 2018-04-22 (×2): 325 mg via ORAL
  Filled 2018-04-20 (×2): qty 1

## 2018-04-20 MED ORDER — GABAPENTIN 300 MG PO CAPS
600.0000 mg | ORAL_CAPSULE | Freq: Two times a day (BID) | ORAL | Status: DC
  Administered 2018-04-20: 600 mg via ORAL
  Filled 2018-04-20: qty 2

## 2018-04-20 MED ORDER — ONDANSETRON HCL 4 MG PO TABS: 4.0000 mg | ORAL_TABLET | Freq: Four times a day (QID) | ORAL | Status: DC | PRN

## 2018-04-20 MED ORDER — CYANOCOBALAMIN 500 MCG PO TABS
500.0000 ug | ORAL_TABLET | Freq: Every day | ORAL | Status: DC
  Administered 2018-04-20 – 2018-04-22 (×3): 500 ug via ORAL
  Filled 2018-04-20 (×4): qty 1

## 2018-04-20 MED ORDER — TAMSULOSIN HCL 0.4 MG PO CAPS
0.4000 mg | ORAL_CAPSULE | Freq: Every day | ORAL | Status: DC
  Administered 2018-04-20 – 2018-04-22 (×3): 0.4 mg via ORAL
  Filled 2018-04-20 (×3): qty 1

## 2018-04-20 MED ORDER — SODIUM CHLORIDE 0.9 % IV BOLUS
250.0000 mL | Freq: Once | INTRAVENOUS | Status: AC
  Administered 2018-04-20: 250 mL via INTRAVENOUS

## 2018-04-20 MED ORDER — ACETAMINOPHEN 650 MG RE SUPP: 650.0000 mg | Freq: Four times a day (QID) | RECTAL | Status: DC | PRN

## 2018-04-20 MED ORDER — BOOST / RESOURCE BREEZE PO LIQD CUSTOM
1.0000 | Freq: Three times a day (TID) | ORAL | Status: DC
  Administered 2018-04-21 – 2018-04-22 (×4): 1 via ORAL

## 2018-04-20 MED ORDER — MAGNESIUM SULFATE 2 GM/50ML IV SOLN
2.0000 g | Freq: Once | INTRAVENOUS | Status: AC
  Administered 2018-04-20: 2 g via INTRAVENOUS
  Filled 2018-04-20: qty 50

## 2018-04-20 MED ORDER — GABAPENTIN 300 MG PO CAPS
300.0000 mg | ORAL_CAPSULE | Freq: Two times a day (BID) | ORAL | Status: DC
  Administered 2018-04-20 – 2018-04-22 (×4): 300 mg via ORAL
  Filled 2018-04-20 (×4): qty 1

## 2018-04-20 MED ORDER — ACETAMINOPHEN 325 MG PO TABS: 650.0000 mg | ORAL_TABLET | Freq: Four times a day (QID) | ORAL | Status: DC | PRN

## 2018-04-20 MED ORDER — SODIUM CHLORIDE 0.9 % IV SOLN
INTRAVENOUS | Status: DC
  Administered 2018-04-20 – 2018-04-21 (×2): via INTRAVENOUS

## 2018-04-20 NOTE — ED Notes (Signed)
Dr. Dorothy Puffer contacted with pt c/o burning and "Jumping" at bilateral feet.

## 2018-04-20 NOTE — ED Notes (Signed)
Warm blankets applied

## 2018-04-20 NOTE — Consult Note (Signed)
NAME:  Jose [name], MRN:  [id number], DOB:  [birth year], LOS: 1 ADMISSION DATE:  04/19/2018, CONSULTATION DATE:  04/20/18 REFERRING MD:  Hal Hope , CHIEF COMPLAINT:  AMS   Brief History   84 yo male with AMS , found to be hypotensive with b/l lower lobe lung infiltrates.  History of present illness   84 yo male with hx of recent ESBL E coli bacteremia secondary to ascending cholangitis s/p ercp and bilary stent found today by family altered at home, febrile and with shortness of breath. He lives alone but somebody checks on him every 2 hrs.  In the ED he received thus far 3 L of NS and is on low dose phenylephrine, his mentation has improved per the daughter at bedside. He denies any n/v/d, chest pain , abdominal pain, cough. Ros + SOB AND fever  Past Medical History  htn Cholangitis Bilary stent  Significant Hospital Events   Currently receiving 4th Liter of crystalloids , hopefully we can get him off the pressors.  Consults:  CCM  Procedures:  Family does not want any procedures done.  Significant Diagnostic Tests:  CT ab/p - bilary stent patent , bibasilar pneumonia  Micro Data:  pending  Antimicrobials:  Meropenem day 0    Objective   Blood pressure (!) 88/51, pulse 70, temperature (!) 102 F (38.9 C), temperature source Rectal, resp. rate 19, SpO2 100 %.        Intake/Output Summary (Last 24 hours) at 04/20/2018 0024 Last data filed at 04/19/2018 2344 Gross per 24 hour  Intake 3428.29 ml  Output -  Net 3428.29 ml   There were no vitals filed for this visit.  Examination: General: The patient is awake, alert, and in NAD , heard of hearing HEENT: The scalp is atraumatic. PERRL. EOMI. Conjunctivae and sclerae are normal. There is no icterus. Pharynx clear. Dry MM Neck: Supple. No submandibular/cervical LA. Trachea midline, No thyromegaly.  Lungs: Good air entry throughout the lung fields, normal respiratory effort. No W/R/R.  Cardiovascular:  TACHYCARDIC with systolic murmur  Abdomen: Soft, NT,ND, No masses, OM or guarding. Normoactive bowel sounds.  Extremities: Warm and well perfused. No cyanosis, clubbing or edema.  Skin: Intact with no rashes, excessive bruising, or petechiae. , capillary refill 2 seconds MSK: Strength 5/5 and symmetric x 4.  Neuro: moving all ext and following commands  Psychiatric: Oriented x 2  Assessment & Plan:  Sepsis secondary to bilateral lower lobe pneumonia - blood and sputum cultures - On 4 l [place] Meropenem and Vanco - hx of recent ESBL E coli bacteremia from asceding cholagitis. The CT of the abdomen does not show any biliary issues , LFT's are normal and bilis and below previous admission values. He does not have abdominal pain Plan to infuse 4TH liter of crystalloids and evaluate BP . He is currently on low dose phenyephrine and hopefully we can get him off and admit to the floor.  He is DNR/DNI and no aggressive procedures were requested by family. If he were to need pressors would cont with peripheral neo-synephrine and avoid central line placement. Will re-assess  Lactic acidosis - repeat after fluid resuscitation  Goals of care - DNR/DNI , no central line  Best practice:  Diet: as tolerated Pain/Anxiety/Delirium protocol (if indicated): n/a VAP protocol (if indicated): n/a DVT prophylaxis: hsq GI prophylaxis: n/a Glucose control: NISS Mobility: as tolerated Code Status: DNR/DNI Family Communication: UPDATED AT BEDSIDE Disposition: To be determined, will wait for 4th liter to  infuse and make a decision of ICU vs tele  Labs   CBC: Recent Labs  Lab 04/19/18 1900  WBC 11.4*  NEUTROABS 10.5*  HGB 10.1*  HCT 32.4*  MCV 103.8*  PLT 578    Basic Metabolic Panel: Recent Labs  Lab 04/19/18 1900  NA 133*  K 3.4*  CL 99  CO2 21*  GLUCOSE 145*  BUN 17  CREATININE 1.07  CALCIUM 8.7*   GFR: Estimated Creatinine Clearance: 35 mL/min (by C-G formula based on SCr of 1.07  mg/dL). Recent Labs  Lab 04/19/18 1900 04/19/18 1911 04/19/18 2249  WBC 11.4*  --   --   LATICACIDVEN  --  3.93* 3.48*    Liver Function Tests: Recent Labs  Lab 04/19/18 1900  AST 39  ALT 38  ALKPHOS 266*  BILITOT 1.9*  PROT 6.9  ALBUMIN 3.1*   Recent Labs  Lab 04/19/18 1900  LIPASE 36   No results for input(s): AMMONIA in the last 168 hours.  ABG No results found for: PHART, PCO2ART, PO2ART, HCO3, TCO2, ACIDBASEDEF, O2SAT   Coagulation Profile: Recent Labs  Lab 04/19/18 1900  INR 1.31    Cardiac Enzymes: No results for input(s): CKTOTAL, CKMB, CKMBINDEX, TROPONINI in the last 168 hours.  HbA1C: No results found for: HGBA1C  CBG: No results for input(s): GLUCAP in the last 168 hours.  Review of Systems:   12 point ros done and negative , pertinent positives are mentioned in the HPI.  Past Medical History  He,  has a past medical history of Anxiety, Back problem (1960), BPH (benign prostatic hypertrophy), Cervical radiculopathy, Dyspnea, GERD (gastroesophageal reflux disease), Hypertension, Hypokalemia, and Melanoma in situ ([place]) (12/2016).   Surgical History    Past Surgical History:  Procedure Laterality Date  . BILIARY STENT PLACEMENT  03/30/2018   Procedure: BILIARY STENT PLACEMENT;  Surgeon: Clarene Essex, MD;  Location: [facility];  Service: Endoscopy;;  . CATARACT EXTRACTION     OD  . CHOLECYSTECTOMY N/A 01/26/2018   Procedure: LAPAROSCOPIC CHOLECYSTECTOMY;  Surgeon: Coralie Keens, MD;  Location: [facility];  Service: General;  Laterality: N/A;  . ENDOSCOPIC RETROGRADE CHOLANGIOPANCREATOGRAPHY (ERCP) WITH PROPOFOL N/A 03/13/2018   Procedure: ENDOSCOPIC RETROGRADE CHOLANGIOPANCREATOGRAPHY (ERCP) WITH PROPOFOL;  Surgeon: Lucilla Lame, MD;  Location: [facility] ENDOSCOPY;  Service: Endoscopy;  Laterality: N/A;  . ERCP N/A 03/29/2018   Procedure: ENDOSCOPIC RETROGRADE CHOLANGIOPANCREATOGRAPHY (ERCP);  Surgeon: Ronnette Juniper, MD;  Location: [facility];   Service: Gastroenterology;  Laterality: N/A;  . ERCP N/A 03/30/2018   Procedure: ENDOSCOPIC RETROGRADE CHOLANGIOPANCREATOGRAPHY (ERCP);  Surgeon: Clarene Essex, MD;  Location: [place];  Service: Endoscopy;  Laterality: N/A;  . IR PERC CHOLECYSTOSTOMY  12/28/2017  . LAPAROSCOPIC CHOLECYSTECTOMY  01/26/2018  . MELANOMA EXCISION  12/2016   in situ---Dr Dasher  . SPHINCTEROTOMY  03/30/2018   Procedure: SPHINCTEROTOMY;  Surgeon: Clarene Essex, MD;  Location: [facility] ENDOSCOPY;  Service: Endoscopy;;  . STENT REMOVAL  03/29/2018   Procedure: STENT REMOVAL;  Surgeon: Ronnette Juniper, MD;  Location: [facility];  Service: Gastroenterology;;  . TONSILLECTOMY       Social History   reports that he has never smoked. He has never used smokeless tobacco. He reports that he does not drink alcohol or use drugs.   Family History   His family history includes Cancer in his son. There is no history of Coronary artery disease or Diabetes.   Allergies No Known Allergies   Home Medications  Prior to Admission medications   Medication Sig Start  Date End Date Taking? Authorizing Provider  ferrous sulfate 325 (65 FE) MG EC tablet Take 325 mg by mouth daily with breakfast.   Yes [provider]  gabapentin (NEURONTIN) 600 MG tablet TAKE 1 TABLET (600 MG TOTAL) BY MOUTH 2 (TWO) TIMES DAILY. Patient taking differently: Take 600 mg by mouth 2 (two) times daily.  07/21/17  Yes [name], MD  ketoconazole (NIZORAL) 2 % cream Apply 1 application topically 2 (two) times daily as needed for irritation. 05/07/17  Yes [name], MD  metoprolol succinate (TOPROL-XL) 25 MG 24 hr tablet Take 1 tablet (25 mg total) by mouth daily. 03/17/18  Yes Wieting, Richard, MD  Multiple Vitamin (MULTIVITAMIN WITH MINERALS) TABS tablet Take 1 tablet by mouth daily. 03/17/18  Yes Wieting, Richard, MD  tamsulosin (FLOMAX) 0.4 MG CAPS capsule TAKE 1 CAPSULE (0.4 MG TOTAL) BY MOUTH DAILY. Patient taking differently: Take 0.4  mg by mouth daily.  07/21/17  Yes Viviana Simpler I, MD  triamcinolone cream (KENALOG) 0.1 % Apply 1 application topically 2 (two) times daily as needed. Patient taking differently: Apply 1 application topically 2 (two) times daily as needed (irritation).  09/05/16  Yes [name], MD  vitamin B-12 (CYANOCOBALAMIN) 500 MCG tablet Take 500 mcg by mouth daily.   Yes [provider]  feeding supplement, ENSURE ENLIVE, (ENSURE ENLIVE) LIQD Take 6 mLs by mouth 2 (two) times daily between meals. 03/17/18   Loletha Grayer, MD     Critical care time spent with pt, reviewing pt's medical record/chart, pertinent labs/imaging, discussing management plan with ICU multidisciplinary team, making medical decisions, managing pt's resp failure/vent management, excluding procedures : 40 minutes.

## 2018-04-20 NOTE — ED Notes (Signed)
Pt offered tramadol but declined

## 2018-04-20 NOTE — Progress Notes (Addendum)
  [place] TEAM 1 - Stepdown/ICU TEAM  Jose [name] [place]  IOM:[id number] DOB: Sep 11, 1923 DOA: 04/19/2018 PCP: [name], MD    Brief Narrative:  84 y.o. male with a hx of hypertension and a recent admit for cholangitis with CBD stricture requiring stent placement who presented w/ sudden confusion and incontinence of urine and bowel.   In the ER patient was hypotensive with a temp of 102 F WBC count of 11.4 and normal LFTs. CT abdomen pelvis showed possibility of early pancreatitis and bilateral pneumonia. CT head was unremarkable.   Significant Events: 12/2 admit  Subjective: Pt is seen for a f/u visit.    Assessment & Plan:  Severe sepsis secondary to B LL Pneumonia with recent ESBL bacteremia patient has been placed on vancomycin and meropenem - clinically improving rapidly   Recent stent placement for CBD stricture and acute colitis with ESBL bacteremia Clinically stable   Hypertension holding antihypertensives due to sepsis  Chronic anemia Hgb declining - no apparent blood loss - likely dilutional - stop lovenox as precaution and f/u CBC in AM   Possible early pancreatitis per CT scan - no sx to support this   Hypokalemia  Hypomagnesemia   DVT prophylaxis: lovenox  Code Status: DNR - NO CODE Family Communication: spoke w/ dgtr at bedside  Disposition Plan: stable for tele bed  Consultants:  PCCM  Objective: Blood pressure 127/66, pulse 88, temperature (!) 97.2 F (36.2 C), temperature source Oral, resp. rate (!) 26, SpO2 95 %.  Intake/Output Summary (Last 24 hours) at 04/20/2018 1112 Last data filed at 04/20/2018 8453 Gross per 24 hour  Intake 3778.31 ml  Output -  Net 3778.31 ml   There were no vitals filed for this visit.  Examination: Pt was seen for a f/u visit.    CBC: Recent Labs  Lab 04/19/18 1900 04/20/18 0240  WBC 11.4* 10.8*  NEUTROABS 10.5* 9.1*  HGB 10.1* 7.8*  HCT 32.4* 24.7*  MCV 103.8* 102.5*  PLT 165 106*   Basic  Metabolic Panel: Recent Labs  Lab 04/19/18 1900 04/20/18 0128  NA 133* 133*  K 3.4* 3.4*  CL 99 107  CO2 21* 19*  GLUCOSE 145* 132*  BUN 17 15  CREATININE 1.07 0.85  CALCIUM 8.7* 7.0*  MG  --  1.4*   GFR: Estimated Creatinine Clearance: 44.1 mL/min (by C-G formula based on SCr of 0.85 mg/dL).  Liver Function Tests: Recent Labs  Lab 04/19/18 1900 04/20/18 0240  AST 39 31  ALT 38 29  ALKPHOS 266* 176*  BILITOT 1.9* 1.3*  PROT 6.9 5.2*  ALBUMIN 3.1* 2.2*   Recent Labs  Lab 04/19/18 1900  LIPASE 36    Coagulation Profile: Recent Labs  Lab 04/19/18 1900  INR 1.31    Scheduled Meds: . enoxaparin (LOVENOX) injection  30 mg Subcutaneous Q24H  . gabapentin  600 mg Oral BID   Continuous Infusions: . sodium chloride 150 mL/hr (04/20/18 0933)  . meropenem (MERREM) IV Stopped (04/20/18 0106)  . potassium chloride 10 mEq (04/20/18 1105)  . vancomycin       LOS: 1 day   Time spent: No Charge  Cherene Altes, MD [facility] Office  [phone] Pager - Text Page per Amion as per below:  On-Call/Text Page:      [email or web address]  If 7PM-7AM, please contact night-coverage [email or web address] 04/20/2018, 11:12 AM

## 2018-04-20 NOTE — H&P (Addendum)
History and Physical    Jose [name] [place] HLK:[id number] DOB: Jul 06, 1923 DOA: 04/19/2018  PCP: [name], MD  Patient coming from: Home.  Chief Complaint: Altered mental status for  HPI: Jose [name] is a 84 y.o. 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  Patient was brought to the ER.  As per the patient's family was at the bedside patient was not having any abdominal pain nausea vomiting chest pain or shortness of breath prior to the sudden change in mental status.  ED Course: In the ER patient was hypotensive febrile with temperature 102 F WBC count of 11.4 LFTs are normal.  CT abdomen pelvis showed possibility of early pancreatitis and bilateral pneumonia.  Patient was placed on empiric antibiotics for sepsis and started on sepsis protocol IV fluids.  On my exam patient is confused but follows commands and moves all extremities.  CT head was unremarkable.  EKG shows sinus tachycardia.  Review of Systems: As per HPI, rest all negative.   Past Medical History:  Diagnosis Date  . Anxiety   . Back problem 1960  . BPH (benign prostatic hypertrophy)   . Cervical radiculopathy    with neuropathy  . Dyspnea   . GERD (gastroesophageal reflux disease)   . Hypertension   . Hypokalemia   . Melanoma in situ ([place]) 12/2016   neck    Past Surgical History:  Procedure Laterality Date  . BILIARY STENT PLACEMENT  03/30/2018   Procedure: BILIARY STENT PLACEMENT;  Surgeon: Clarene Essex, MD;  Location: [place];  Service: Endoscopy;;  . CATARACT EXTRACTION     OD  . CHOLECYSTECTOMY N/A 01/26/2018   Procedure: LAPAROSCOPIC CHOLECYSTECTOMY;  Surgeon: Coralie Keens, MD;  Location: [place];  Service: General;  Laterality: N/A;  . ENDOSCOPIC RETROGRADE CHOLANGIOPANCREATOGRAPHY  (ERCP) WITH PROPOFOL N/A 03/13/2018   Procedure: ENDOSCOPIC RETROGRADE CHOLANGIOPANCREATOGRAPHY (ERCP) WITH PROPOFOL;  Surgeon: Lucilla Lame, MD;  Location: [facility] ENDOSCOPY;  Service: Endoscopy;  Laterality: N/A;  . ERCP N/A 03/29/2018   Procedure: ENDOSCOPIC RETROGRADE CHOLANGIOPANCREATOGRAPHY (ERCP);  Surgeon: Ronnette Juniper, MD;  Location: [place];  Service: Gastroenterology;  Laterality: N/A;  . ERCP N/A 03/30/2018   Procedure: ENDOSCOPIC RETROGRADE CHOLANGIOPANCREATOGRAPHY (ERCP);  Surgeon: Clarene Essex, MD;  Location: [place];  Service: Endoscopy;  Laterality: N/A;  . IR PERC CHOLECYSTOSTOMY  12/28/2017  . LAPAROSCOPIC CHOLECYSTECTOMY  01/26/2018  . MELANOMA EXCISION  12/2016   in situ---Dr Dasher  . SPHINCTEROTOMY  03/30/2018   Procedure: SPHINCTEROTOMY;  Surgeon: Clarene Essex, MD;  Location: [facility] ENDOSCOPY;  Service: Endoscopy;;  . STENT REMOVAL  03/29/2018   Procedure: STENT REMOVAL;  Surgeon: Ronnette Juniper, MD;  Location: [place];  Service: Gastroenterology;;  . TONSILLECTOMY       reports that he has never smoked. He has never used smokeless tobacco. He reports that he does not drink alcohol or use drugs.  No Known Allergies  Family History  Problem Relation Age of Onset  . Cancer Son   . Coronary artery disease Neg Hx   . Diabetes Neg Hx     Prior to Admission medications   Medication Sig Start Date End Date Taking? Authorizing Provider  ferrous sulfate 325 (65 FE) MG EC tablet Take 325 mg by mouth daily with breakfast.   Yes [provider]  gabapentin (NEURONTIN) 600 MG tablet  TAKE 1 TABLET (600 MG TOTAL) BY MOUTH 2 (TWO) TIMES DAILY. Patient taking differently: Take 600 mg by mouth 2 (two) times daily.  07/21/17  Yes [name], MD  ketoconazole (NIZORAL) 2 % cream Apply 1 application topically 2 (two) times daily as needed for irritation. 05/07/17  Yes [name], MD  metoprolol succinate (TOPROL-XL) 25 MG 24 hr tablet Take 1 tablet (25 mg  total) by mouth daily. 03/17/18  Yes Wieting, Richard, MD  Multiple Vitamin (MULTIVITAMIN WITH MINERALS) TABS tablet Take 1 tablet by mouth daily. 03/17/18  Yes Wieting, Richard, MD  tamsulosin (FLOMAX) 0.4 MG CAPS capsule TAKE 1 CAPSULE (0.4 MG TOTAL) BY MOUTH DAILY. Patient taking differently: Take 0.4 mg by mouth daily.  07/21/17  Yes Viviana Simpler I, MD  triamcinolone cream (KENALOG) 0.1 % Apply 1 application topically 2 (two) times daily as needed. Patient taking differently: Apply 1 application topically 2 (two) times daily as needed (irritation).  09/05/16  Yes [name], MD  vitamin B-12 (CYANOCOBALAMIN) 500 MCG tablet Take 500 mcg by mouth daily.   Yes [provider]  feeding supplement, ENSURE ENLIVE, (ENSURE ENLIVE) LIQD Take 6 mLs by mouth 2 (two) times daily between meals. 03/17/18   Loletha Grayer, MD    Physical Exam: Vitals:   04/20/18 0045 04/20/18 0050 04/20/18 0055 04/20/18 0100  BP: (!) 98/57 (!) 100/50 (!) 96/55 (!) 101/59  Pulse: 65 68 70 74  Resp: 16 16 18  (!) 21  Temp:      TempSrc:      SpO2: 100% 100% 100% 100%      Constitutional: Moderately built and nourished. Vitals:   04/20/18 0045 04/20/18 0050 04/20/18 0055 04/20/18 0100  BP: (!) 98/57 (!) 100/50 (!) 96/55 (!) 101/59  Pulse: 65 68 70 74  Resp: 16 16 18  (!) 21  Temp:      TempSrc:      SpO2: 100% 100% 100% 100%   Eyes: Anicteric no pallor. ENMT: No discharge from the ears eyes nose or mouth. Neck: No mass felt.  No neck rigidity.  No JVD appreciated. Respiratory: No rhonchi or crepitations. Cardiovascular: S1-S2 heard no murmurs appreciated. Abdomen: Soft nontender bowel sounds present. Musculoskeletal: No edema.  No joint effusion. Skin: No rash. Neurologic: Mildly drowsy but oriented to time place and person moves all extremities. Psychiatric: Oriented to place and time and person.   Labs on Admission: I have personally reviewed following labs and imaging  studies  CBC: Recent Labs  Lab 04/19/18 1900  WBC 11.4*  NEUTROABS 10.5*  HGB 10.1*  HCT 32.4*  MCV 103.8*  PLT 401   Basic Metabolic Panel: Recent Labs  Lab 04/19/18 1900  NA 133*  K 3.4*  CL 99  CO2 21*  GLUCOSE 145*  BUN 17  CREATININE 1.07  CALCIUM 8.7*   GFR: Estimated Creatinine Clearance: 35 mL/min (by C-G formula based on SCr of 1.07 mg/dL). Liver Function Tests: Recent Labs  Lab 04/19/18 1900  AST 39  ALT 38  ALKPHOS 266*  BILITOT 1.9*  PROT 6.9  ALBUMIN 3.1*   Recent Labs  Lab 04/19/18 1900  LIPASE 36   No results for input(s): AMMONIA in the last 168 hours. Coagulation Profile: Recent Labs  Lab 04/19/18 1900  INR 1.31   Cardiac Enzymes: No results for input(s): CKTOTAL, CKMB, CKMBINDEX, TROPONINI in the last 168 hours. BNP (last 3 results) No results for input(s): PROBNP in the last 8760 hours. HbA1C: No results  for input(s): HGBA1C in the last 72 hours. CBG: No results for input(s): GLUCAP in the last 168 hours. Lipid Profile: No results for input(s): CHOL, HDL, LDLCALC, TRIG, CHOLHDL, LDLDIRECT in the last 72 hours. Thyroid Function Tests: No results for input(s): TSH, T4TOTAL, FREET4, T3FREE, THYROIDAB in the last 72 hours. Anemia Panel: No results for input(s): VITAMINB12, FOLATE, FERRITIN, TIBC, IRON, RETICCTPCT in the last 72 hours. Urine analysis:    Component Value Date/Time   COLORURINE AMBER (A) 04/19/2018 2025   APPEARANCEUR HAZY (A) 04/19/2018 2025   LABSPEC 1.016 04/19/2018 2025   PHURINE 5.0 04/19/2018 2025   GLUCOSEU NEGATIVE 04/19/2018 2025   HGBUR NEGATIVE 04/19/2018 2025   BILIRUBINUR NEGATIVE 04/19/2018 2025   KETONESUR NEGATIVE 04/19/2018 2025   PROTEINUR NEGATIVE 04/19/2018 2025   NITRITE NEGATIVE 04/19/2018 2025   LEUKOCYTESUR NEGATIVE 04/19/2018 2025   Sepsis Labs: @LABRCNTIP (procalcitonin:4,lacticidven:4) )No results found for this or any previous visit (from the past 240 hour(s)).   Radiological  Exams on Admission: Dg Chest 2 View  Result Date: 04/19/2018 CLINICAL DATA:  Fever.  Disorientation. EXAM: CHEST - 2 VIEW COMPARISON:  March 11, 2018 FINDINGS: The heart size and mediastinal contours are within normal limits. Both lungs are clear. The visualized skeletal structures are unremarkable. IMPRESSION: No active cardiopulmonary disease. Electronically Signed   By: Dorise Bullion III M.D   On: 04/19/2018 19:29   Ct Head Wo Contrast  Result Date: 04/19/2018 CLINICAL DATA:  Patient found today with altered mental status and fever. EXAM: CT HEAD WITHOUT CONTRAST TECHNIQUE: Contiguous axial images were obtained from the base of the skull through the vertex without intravenous contrast. COMPARISON:  None. FINDINGS: Brain: Ventricles and cisterns are normal. Mild prominence of the CSF spaces compatible with age related atrophic change. Minimal chronic ischemic microvascular disease. Old lacunar infarct over the right periventricular white matter. No mass, mass effect, shift of midline structures or acute hemorrhage. No evidence of acute infarction. Vascular: No hyperdense vessel or unexpected calcification. Skull: Normal. Negative for fracture or focal lesion. Sinuses/Orbits: Orbits are normal. Minimal mucosal membrane thickening over the floor the right maxillary sinus. Other: None. IMPRESSION: No acute findings. Mild atrophy and chronic ischemic microvascular disease. Small old right periventricular lacunar infarct. Electronically Signed   By: Marin Olp M.D.   On: 04/19/2018 19:50   Ct Abdomen Pelvis W Contrast  Result Date: 04/19/2018 CLINICAL DATA:  Abdominal pain.  Sepsis. EXAM: CT ABDOMEN AND PELVIS WITH CONTRAST TECHNIQUE: Multidetector CT imaging of the abdomen and pelvis was performed using the standard protocol following bolus administration of intravenous contrast. CONTRAST:  124mL OMNIPAQUE IOHEXOL 300 MG/ML  SOLN COMPARISON:  Plain films 04/02/2018.  CT 03/27/2018. FINDINGS: Lower  chest: Ground-glass airspace disease in the lower lobes bilaterally compatible with pneumonia. Heart is mildly enlarged. No effusions. Hepatobiliary: Prior cholecystectomy. There is a metallic stent within the common bile duct and pneumobilia and mild intrahepatic and extrahepatic biliary ductal dilatation. No focal hepatic abnormality. Pancreas: Pancreatic ductal dilatation is noted and stable. Again noted is an area of focal low-density within the uncinate process measuring 1.5 cm, stable. Slight haziness/stranding around the pancreas. Cannot exclude early acute pancreatitis. Spleen: No focal abnormality.  Normal size. Adrenals/Urinary Tract: Areas of cortical thinning and scarring in the left kidney. Small bilateral renal cysts. No hydronephrosis. Adrenal glands and urinary bladder unremarkable. Stomach/Bowel: Sigmoid diverticulosis. No active diverticulitis. Normal appendix. Stomach and small bowel decompressed, unremarkable. Vascular/Lymphatic: Aortic atherosclerosis. No enlarged abdominal or pelvic lymph nodes. Reproductive: Prostate enlargement.  Other: No free fluid or free air. Musculoskeletal: Degenerative changes in the lumbar spine. No acute bony abnormality. IMPRESSION: Ground-glass airspace consolidation in the lower lobes bilaterally compatible with pneumonia. Prior metallic stenting of the common bile duct and cholecystectomy. Pneumobilia and mild biliary ductal dilatation. Slight stranding around the pancreas may reflect early acute pancreatitis. Stable low-density area within the uncinate process. Sigmoid diverticulosis.  No active diverticulitis. Prostate enlargement. Electronically Signed   By: Rolm Baptise M.D.   On: 04/19/2018 22:11    EKG: Independently reviewed.  Sinus tachycardia.  Assessment/Plan Principal Problem:   Sepsis ([place]) Active Problems:   Essential hypertension, benign   Lobar pneumonia ([place])    1. Severe sepsis secondary to pneumonia with recent ESBL bacteremia  patient has been placed on vancomycin and meropenem.  Since patient's blood pressure did not improve with sepsis protocol IV fluids patient was started on Neo-Synephrine.  Pulmonary critical care was consulted and with another liter of bolus fluid patient blood pressure improved and Neo-Synephrine was discontinued.  Per patient's son was at the bedside patient does not want to be having any chest compressions or intubation but okay with vasopressor. 2. Recent stent placement for CBD stricture and acute colitis with a ESBL bacteremia. 3. Hypertension holding antihypertensives due to sepsis. 4. Chronic anemia usually on iron supplements. 5. Possible early pancreatitis per the CAT scan.  Continue to monitor.  Continue IV fluids   DVT prophylaxis: Lovenox. Code Status: Patient does not want chest compressions or intubation as per patient's son.  Okay with vasopressor. Family Communication: Patient's son. Disposition Plan: Home. Consults called: Pulmonary critical care. Admission status: Inpatient.   Rise Patience MD [facility] Pager [phone].  If 7PM-7AM, please contact night-coverage [email or web address] Password [email or web address]  04/20/2018, 1:25 AM

## 2018-04-20 NOTE — ED Notes (Signed)
Pt deneis pain but c/o "jumping feet" that make it hard to rest. Will inform provider. Pt very hard of hearing.

## 2018-04-20 NOTE — ED Notes (Signed)
Clear liquid diet provided to pt. Family at bedside

## 2018-04-20 NOTE — ED Notes (Signed)
Another set of blood cultures at bedside,  Need to be labeled properly.  I will notify nurse.

## 2018-04-20 NOTE — ED Notes (Signed)
Nurse will draw labs. 

## 2018-04-20 NOTE — ED Notes (Signed)
Pt ambulatory with assistance from this NT to restroom.

## 2018-04-20 NOTE — ED Notes (Addendum)
Intensivist ordered this nurse to increase rate of fluids until 1000mg  NS received, then reassess pt's BP. Fluids increased to bolus rate.

## 2018-04-20 NOTE — ED Notes (Signed)
This EMT was called into room 18 to assist the pt to the bathroom. Pt stated that he did not want male staff to assist him at this time. This EMT expressed understanding of pts sentiments and notified Yvone Neu, EMT and Suezanne Jacquet, RN of pt request.

## 2018-04-20 NOTE — ED Notes (Signed)
Pt stands briefly to void with assist

## 2018-04-21 DIAGNOSIS — I10 Essential (primary) hypertension: Secondary | ICD-10-CM

## 2018-04-21 LAB — COMPREHENSIVE METABOLIC PANEL
ALT: 26 U/L (ref 0–44)
AST: 22 U/L (ref 15–41)
Albumin: 2 g/dL — ABNORMAL LOW (ref 3.5–5.0)
Alkaline Phosphatase: 161 U/L — ABNORMAL HIGH (ref 38–126)
Anion gap: 5 (ref 5–15)
BUN: 11 mg/dL (ref 8–23)
CO2: 22 mmol/L (ref 22–32)
Calcium: 7.6 mg/dL — ABNORMAL LOW (ref 8.9–10.3)
Chloride: 110 mmol/L (ref 98–111)
Creatinine, Ser: 0.75 mg/dL (ref 0.61–1.24)
GFR calc Af Amer: >60 mL/min (ref >60)
GFR calc non Af Amer: >60 mL/min (ref >60)
Glucose, Bld: 101 mg/dL — ABNORMAL HIGH (ref 70–99)
POTASSIUM: 3.4 mmol/L — AB (ref 3.5–5.1)
Sodium: 137 mmol/L (ref 135–145)
Total Bilirubin: 0.9 mg/dL (ref 0.3–1.2)
Total Protein: 4.7 g/dL — ABNORMAL LOW (ref 6.5–8.1)

## 2018-04-21 LAB — URINE CULTURE: Culture: NO GROWTH

## 2018-04-21 LAB — LEGIONELLA PNEUMOPHILA SEROGP 1 UR AG: L. pneumophila Serogp 1 Ur Ag: NEGATIVE

## 2018-04-21 LAB — CBC
HCT: 22.8 % — ABNORMAL LOW (ref 39.0–52.0)
Hemoglobin: 6.9 g/dL — CL (ref 13.0–17.0)
MCH: 31.5 pg (ref 26.0–34.0)
MCHC: 30.3 g/dL (ref 30.0–36.0)
MCV: 104.1 fL — ABNORMAL HIGH (ref 80.0–100.0)
PLATELETS: 97 10*3/uL — AB (ref 150–400)
RBC: 2.19 MIL/uL — ABNORMAL LOW (ref 4.22–5.81)
RDW: 17.3 % — ABNORMAL HIGH (ref 11.5–15.5)
WBC: 7.6 10*3/uL (ref 4.0–10.5)
nRBC: 0 % (ref 0.0–0.2)

## 2018-04-21 LAB — HEMOGLOBIN AND HEMATOCRIT, BLOOD
HCT: 28.4 % — ABNORMAL LOW (ref 39.0–52.0)
Hemoglobin: 9.4 g/dL — ABNORMAL LOW (ref 13.0–17.0)

## 2018-04-21 LAB — PREPARE RBC (CROSSMATCH)

## 2018-04-21 LAB — MAGNESIUM: Magnesium: 1.9 mg/dL (ref 1.7–2.4)

## 2018-04-21 LAB — ABO/RH: ABO/RH(D): A NEG

## 2018-04-21 MED ORDER — SODIUM CHLORIDE 0.9% IV SOLUTION: Freq: Once | INTRAVENOUS | Status: DC

## 2018-04-21 MED ORDER — POTASSIUM CHLORIDE CRYS ER 20 MEQ PO TBCR
40.0000 meq | EXTENDED_RELEASE_TABLET | Freq: Once | ORAL | Status: AC
  Administered 2018-04-21: 40 meq via ORAL
  Filled 2018-04-21: qty 2

## 2018-04-21 NOTE — Evaluation (Signed)
Occupational Therapy Evaluation Patient Details Name: Jose [name] MRN: [id number] DOB: 14-May-1924 Today's Date: 04/21/2018    History of Present Illness Pt is 84 y.o. male s/p ERCP (03/29/18), with the diagnosis of obstructive jaundice and complaints of epigastric discomfort. Recent surgical history includes laparoscopic cholecystectomy and subsequent ERCP (03/13/18) for ascending cholangitis and bacteremia. PMH significant for cervical radiculopathy with neuropathy, back problems, anxiety, dyspnea, HTN, BPH and GERD.    Clinical Impression   Prior to admission, pt lived alone with family support/providing care for most IADLs, while pt was able to complete most ADLs with mod I. Pt now presenting at min guard level for ADLs and functional mobility d/t generalized weakness and decreased cardiorespiratory support. Pt maintained SpO2 levels between 88-96% during functional mobility around room on RA, pt asymptomatic throughout. Pt's daughter in law reports they are willing to hire 24/7 caregiver for pt- OT recommends this for d/c as well as HHOT follow up.     Follow Up Recommendations  Home health OT;Supervision/Assistance - 24 hour    Equipment Recommendations  None recommended by OT    Recommendations for Other Services PT consult;Speech consult     Precautions / Restrictions Precautions Precautions: Fall Restrictions Weight Bearing Restrictions: No      Mobility Bed Mobility Overal bed mobility: Needs Assistance Bed Mobility: Supine to Sit     Supine to sit: Supervision     General bed mobility comments: for safety  Transfers Overall transfer level: Needs assistance Equipment used: None Transfers: Sit to/from Stand;Stand Pivot Transfers Sit to Stand: Min guard Stand pivot transfers: Min guard       General transfer comment: held IV pole, min guard    Balance Overall balance assessment: Needs assistance Sitting-balance support: Feet supported;No upper  extremity supported Sitting balance-Leahy Scale: Good     Standing balance support: No upper extremity supported;During functional activity Standing balance-Leahy Scale: Fair Standing balance comment: during functional mobility in room                           ADL either performed or assessed with clinical judgement   ADL Overall ADL's : Needs assistance/impaired Eating/Feeding: Independent;Sitting   Grooming: Wash/dry hands;Min guard;Standing   Upper Body Bathing: Set up;Supervision/ safety;Sitting   Lower Body Bathing: Min guard;Sit to/from stand   Upper Body Dressing : Set up;Supervision/safety;Sitting   Lower Body Dressing: Min guard;Sit to/from stand   Toilet Transfer: Min guard;Ambulation;RW   Toileting- Water quality scientist and Hygiene: Min guard;Sit to/from stand       Functional mobility during ADLs: Min guard;Cueing for safety       Vision Patient Visual Report: No change from baseline              Pertinent Vitals/Pain Pain Assessment: No/denies pain     Hand Dominance Right   Extremity/Trunk Assessment Upper Extremity Assessment Upper Extremity Assessment: Generalized weakness   Lower Extremity Assessment Lower Extremity Assessment: Defer to PT evaluation   Cervical / Trunk Assessment Cervical / Trunk Assessment: Kyphotic   Communication Communication Communication: HOH   Cognition Arousal/Alertness: Awake/alert Behavior During Therapy: WFL for tasks assessed/performed Overall Cognitive Status: Within Functional Limits for tasks assessed                                 General Comments: Pt's daughter in law reports pt is at baseline with cognition   General Comments  Gave pt handout on energy conservation strategies            Home Living Family/patient expects to be discharged to:: Private residence Living Arrangements: Alone Available Help at Discharge: Family;Available 24 hours/day Type of Home:  House Home Access: Ramped entrance     Home Layout: One level     Bathroom Shower/Tub: Walk-in shower(fully handicap accessible)   Biochemist, clinical: Standard Bathroom Accessibility: Yes   Home Equipment: Environmental consultant - 2 wheels   Additional Comments: Family will hire 24/7 caregiver for pt      Prior Functioning/Environment Level of Independence: Needs assistance  Gait / Transfers Assistance Needed: walks with RW ADL's / Homemaking Assistance Needed: Pt mod I with ADLs- family completed IADLs Communication / Swallowing Assistance Needed: HOH Comments: Ambulates with SPC at baseline, occasionally drives but most often needs assistance/supervision with IADLs.        OT Problem List: Impaired balance (sitting and/or standing);Decreased cognition;Decreased safety awareness;Decreased knowledge of precautions;Decreased knowledge of use of DME or AE;Decreased strength;Cardiopulmonary status limiting activity      OT Treatment/Interventions: Self-care/ADL training;Therapeutic exercise;Energy conservation;DME and/or AE instruction;Therapeutic activities;Patient/family education;Balance training    OT Goals(Current goals can be found in the care plan section) Acute Rehab OT Goals Patient Stated Goal: to go home OT Goal Formulation: With patient/family Time For Goal Achievement: 04/28/18 Potential to Achieve Goals: Good  OT Frequency: Min 2X/week    AM-PAC OT "6 Clicks" Daily Activity     Outcome Measure Help from another person eating meals?: None Help from another person taking care of personal grooming?: A Little Help from another person toileting, which includes using toliet, bedpan, or urinal?: A Little Help from another person bathing (including washing, rinsing, drying)?: A Little Help from another person to put on and taking off regular upper body clothing?: None Help from another person to put on and taking off regular lower body clothing?: A Little 6 Click Score: 20   End of  Session Equipment Utilized During Treatment: Oxygen Nurse Communication: Mobility status;Other (comment)(left O2 off- pt sating >95% on RA)  Activity Tolerance: Patient tolerated treatment well Patient left: in bed;with call bell/phone within reach  OT Visit Diagnosis: Unsteadiness on feet (R26.81);Other symptoms and signs involving cognitive function                Time: 3646-8032 OT Time Calculation (min): 32 min Charges:  OT General Charges $OT Visit: 1 Visit OT Evaluation $OT Eval Low Complexity: 1 Low OT Treatments $Self Care/Home Management : 8-22 mins  Curtis Sites OTR/L  04/21/2018, 2:35 PM

## 2018-04-21 NOTE — Progress Notes (Signed)
CRITICAL VALUE ALERT  Critical Value:  Hemoglobin 6.9  Date & Time Notied: 04/21/2018 & 0309  Provider Notified: Schorr, NP   Orders Received/Actions taken: No new orders received at this time. Will continue to monitor pt.

## 2018-04-21 NOTE — Progress Notes (Signed)
[facility]  Patient Status: Active (receiving services up to time of hospitalization)  [facility] is providing the following services: RN and PT  If patient discharges after hours, please call [phone].   Jose [name] 04/21/2018, 9:55 AM

## 2018-04-21 NOTE — Progress Notes (Signed)
Telemetry notified RN pts HR was 140, upon assessment, pts HR 95.  Will continue plan of care.

## 2018-04-21 NOTE — Progress Notes (Signed)
PROGRESS NOTE  Jose [name] [facility] BTD:[id number] DOB: Dec 31, 1923 DOA: 04/19/2018 PCP: [name], MD   LOS: 2 days   Brief Narrative / Interim history: 84 year old male with history of hypertension, recent admit for cholangitis with CBD stricture status post stent placement few weeks ago, presented to the hospital and was admitted on 04/19/2018 with sudden confusion, fever, urine incontinence.  In the ER he was found to be septic with a fever of 102, hypotensive, and imaging concern for early pancreatitis and bilateral pneumonia.  Subjective: -Patient feels great, has no complaints this morning, no abdominal pain, no nausea or vomiting.  His breathing is well  Assessment & Plan: Principal Problem:   Sepsis ([place]) Active Problems:   Essential hypertension, benign   Lobar pneumonia (HCC)   Sepsis due to pneumonia [facility])  Principal Problem Severe sepsis secondary to bilateral lower lobe pneumonia -Patient has a history of recent ESBL bacteremia, he was placed on vancomycin and meropenem, improving, continue to monitor cultures, sputum shows few GPC's in pairs/clusters  Additional Problems Recent stent placement for CBD stricture and acute cholangitis with ESBL bacteremia -Stable, LFTs are unremarkable, CT scan of the abdomen and pelvis without significant acute findings  Hypertension -Antihypertensives were held in the setting of sepsis, blood pressure in the 120s-130s this morning, continue to hold, if blood pressure continues to increase will resume tomorrow  Acute on chronic anemia /thrombocytopenia -Patient with more decline of hemoglobin at 6.9 this morning, no apparent blood loss, no melena, no blood in the stool, this is likely in the setting of severe sepsis, it is also accompanied by thrombocytopenia probably also in the setting of sepsis  Possible early pancreatitis -Per CT scan, he denies any abdominal pain, nausea or vomiting, is tolerating a regular diet  well  Hypokalemia/hypomagnesemia -Magnesium now normal, continue to replete potassium   Scheduled Meds: . sodium chloride   Intravenous Once  . feeding supplement  1 Container Oral TID BM  . ferrous sulfate  325 mg Oral Q breakfast  . gabapentin  300 mg Oral BID  . tamsulosin  0.4 mg Oral Daily  . vitamin B-12  500 mcg Oral Daily   Continuous Infusions: . sodium chloride 60 mL/hr at 04/21/18 0100  . meropenem (MERREM) IV 1 g (04/21/18 1253)  . vancomycin 750 mg (04/20/18 2155)   PRN Meds:.acetaminophen, ondansetron **OR** ondansetron (ZOFRAN) IV, oxyCODONE, traMADol  DVT prophylaxis: SCDs Code Status: DNR Family Communication: Discussed with daughter-in-law at bedside Disposition Plan: Home when ready  Consultants:   None  Procedures:   None   Antimicrobials:  Vancomycin / Meropenem 12/1 >>   Objective: Vitals:   04/21/18 0910 04/21/18 0932 04/21/18 1239 04/21/18 1239  BP: 135/62 125/64 132/72 132/72  Pulse: 85 91 75 75  Resp: 20 20 20    Temp: 97.6 F (36.4 C) (!) 97.4 F (36.3 C) 99 F (37.2 C) 98 F (36.7 C)  TempSrc: Oral Oral Oral Oral  SpO2: 98%   96%  Weight:      Height:        Intake/Output Summary (Last 24 hours) at 04/21/2018 1349 Last data filed at 04/21/2018 1239 Gross per 24 hour  Intake 1253.42 ml  Output 100 ml  Net 1153.42 ml   Filed Weights   04/20/18 1455  Weight: 59.7 kg    Examination:  Constitutional: NAD Eyes: PERRL, lids and conjunctivae normal ENMT: Mucous membranes are moist.  Neck: normal, supple Respiratory: clear to auscultation bilaterally, no wheezing, no crackles. Normal  respiratory effort. Cardiovascular: Regular rate and rhythm, no murmurs / rubs / gallops. No LE edema.  Abdomen: no tenderness. Bowel sounds positive.  Musculoskeletal: no clubbing / cyanosis.  Skin: no rashes, lesions, ulcers. No induration Neurologic: CN 2-12 grossly intact. Strength 5/5 in all 4.  Psychiatric: Normal judgment and insight.  Alert and oriented x 3. Normal mood.    Data Reviewed: I have independently reviewed following labs and imaging studies   CBC: Recent Labs  Lab 04/19/18 1900 04/20/18 0240 04/21/18 0219  WBC 11.4* 10.8* 7.6  NEUTROABS 10.5* 9.1*  --   HGB 10.1* 7.8* 6.9*  HCT 32.4* 24.7* 22.8*  MCV 103.8* 102.5* 104.1*  PLT 165 106* 97*   Basic Metabolic Panel: Recent Labs  Lab 04/19/18 1900 04/20/18 0128 04/21/18 0219  NA 133* 133* 137  K 3.4* 3.4* 3.4*  CL 99 107 110  CO2 21* 19* 22  GLUCOSE 145* 132* 101*  BUN 17 15 11   CREATININE 1.07 0.85 0.75  CALCIUM 8.7* 7.0* 7.6*  MG  --  1.4* 1.9   GFR: Estimated Creatinine Clearance: 49.8 mL/min (by C-G formula based on SCr of 0.75 mg/dL). Liver Function Tests: Recent Labs  Lab 04/19/18 1900 04/20/18 0240 04/21/18 0219  AST 39 31 22  ALT 38 29 26  ALKPHOS 266* 176* 161*  BILITOT 1.9* 1.3* 0.9  PROT 6.9 5.2* 4.7*  ALBUMIN 3.1* 2.2* 2.0*   Recent Labs  Lab 04/19/18 1900  LIPASE 36   No results for input(s): AMMONIA in the last 168 hours. Coagulation Profile: Recent Labs  Lab 04/19/18 1900  INR 1.31   Cardiac Enzymes: No results for input(s): CKTOTAL, CKMB, CKMBINDEX, TROPONINI in the last 168 hours. BNP (last 3 results) No results for input(s): PROBNP in the last 8760 hours. HbA1C: No results for input(s): HGBA1C in the last 72 hours. CBG: No results for input(s): GLUCAP in the last 168 hours. Lipid Profile: No results for input(s): CHOL, HDL, LDLCALC, TRIG, CHOLHDL, LDLDIRECT in the last 72 hours. Thyroid Function Tests: No results for input(s): TSH, T4TOTAL, FREET4, T3FREE, THYROIDAB in the last 72 hours. Anemia Panel: No results for input(s): VITAMINB12, FOLATE, FERRITIN, TIBC, IRON, RETICCTPCT in the last 72 hours. Urine analysis:    Component Value Date/Time   COLORURINE AMBER (A) 04/19/2018 2025   APPEARANCEUR HAZY (A) 04/19/2018 2025   LABSPEC 1.016 04/19/2018 2025   PHURINE 5.0 04/19/2018 2025    GLUCOSEU NEGATIVE 04/19/2018 2025   HGBUR NEGATIVE 04/19/2018 2025   BILIRUBINUR NEGATIVE 04/19/2018 2025   KETONESUR NEGATIVE 04/19/2018 2025   PROTEINUR NEGATIVE 04/19/2018 2025   NITRITE NEGATIVE 04/19/2018 2025   LEUKOCYTESUR NEGATIVE 04/19/2018 2025   Sepsis Labs: Invalid input(s): PROCALCITONIN, LACTICIDVEN  Recent Results (from the past 240 hour(s))  Urine Culture     Status: None   Collection Time: 04/19/18  8:25 PM  Result Value Ref Range Status   Specimen Description URINE, CATHETERIZED  Final   Special Requests NONE  Final   Culture   Final    NO GROWTH Performed at [facility], [address] [place], [place] [address]    Report Status 04/21/2018 FINAL  Final  Culture, sputum-assessment     Status: None   Collection Time: 04/20/18 11:24 AM  Result Value Ref Range Status   Specimen Description SPUTUM  Final   Special Requests NONE  Final   Sputum evaluation   Final    THIS SPECIMEN IS ACCEPTABLE FOR SPUTUM CULTURE Performed at [facility]  [facility], [place] [address] [place], [place] [address]    Report Status 04/20/2018 FINAL  Final  Culture, respiratory     Status: None (Preliminary result)   Collection Time: 04/20/18 11:24 AM  Result Value Ref Range Status   Specimen Description SPUTUM  Final   Special Requests NONE Reflexed from [id number]  Final   Gram Stain   Final    RARE WBC PRESENT, PREDOMINANTLY PMN FEW GRAM POSITIVE COCCI IN PAIRS IN CLUSTERS FEW SQUAMOUS EPITHELIAL CELLS PRESENT    Culture   Final    CULTURE REINCUBATED FOR BETTER GROWTH Performed at [facility], [place] [address], [place], [place] [address]    Report Status PENDING  Incomplete      Radiology Studies: Dg Chest 2 View  Result Date: 04/19/2018 CLINICAL DATA:  Fever.  Disorientation. EXAM: CHEST - 2 VIEW COMPARISON:  March 11, 2018 FINDINGS: The heart size and mediastinal contours are within normal limits. Both lungs are clear. The visualized skeletal structures are  unremarkable. IMPRESSION: No active cardiopulmonary disease. Electronically Signed   By: Dorise Bullion III M.D   On: 04/19/2018 19:29   Ct Head Wo Contrast  Result Date: 04/19/2018 CLINICAL DATA:  Patient found today with altered mental status and fever. EXAM: CT HEAD WITHOUT CONTRAST TECHNIQUE: Contiguous axial images were obtained from the base of the skull through the vertex without intravenous contrast. COMPARISON:  None. FINDINGS: Brain: Ventricles and cisterns are normal. Mild prominence of the CSF spaces compatible with age related atrophic change. Minimal chronic ischemic microvascular disease. Old lacunar infarct over the right periventricular white matter. No mass, mass effect, shift of midline structures or acute hemorrhage. No evidence of acute infarction. Vascular: No hyperdense vessel or unexpected calcification. Skull: Normal. Negative for fracture or focal lesion. Sinuses/Orbits: Orbits are normal. Minimal mucosal membrane thickening over the floor the right maxillary sinus. Other: None. IMPRESSION: No acute findings. Mild atrophy and chronic ischemic microvascular disease. Small old right periventricular lacunar infarct. Electronically Signed   By: Marin Olp M.D.   On: 04/19/2018 19:50   Ct Abdomen Pelvis W Contrast  Result Date: 04/19/2018 CLINICAL DATA:  Abdominal pain.  Sepsis. EXAM: CT ABDOMEN AND PELVIS WITH CONTRAST TECHNIQUE: Multidetector CT imaging of the abdomen and pelvis was performed using the standard protocol following bolus administration of intravenous contrast. CONTRAST:  134mL OMNIPAQUE IOHEXOL 300 MG/ML  SOLN COMPARISON:  Plain films 04/02/2018.  CT 03/27/2018. FINDINGS: Lower chest: Ground-glass airspace disease in the lower lobes bilaterally compatible with pneumonia. Heart is mildly enlarged. No effusions. Hepatobiliary: Prior cholecystectomy. There is a metallic stent within the common bile duct and pneumobilia and mild intrahepatic and extrahepatic biliary  ductal dilatation. No focal hepatic abnormality. Pancreas: Pancreatic ductal dilatation is noted and stable. Again noted is an area of focal low-density within the uncinate process measuring 1.5 cm, stable. Slight haziness/stranding around the pancreas. Cannot exclude early acute pancreatitis. Spleen: No focal abnormality.  Normal size. Adrenals/Urinary Tract: Areas of cortical thinning and scarring in the left kidney. Small bilateral renal cysts. No hydronephrosis. Adrenal glands and urinary bladder unremarkable. Stomach/Bowel: Sigmoid diverticulosis. No active diverticulitis. Normal appendix. Stomach and small bowel decompressed, unremarkable. Vascular/Lymphatic: Aortic atherosclerosis. No enlarged abdominal or pelvic lymph nodes. Reproductive: Prostate enlargement. Other: No free fluid or free air. Musculoskeletal: Degenerative changes in the lumbar spine. No acute bony abnormality. IMPRESSION: Ground-glass airspace consolidation in the lower lobes bilaterally compatible with pneumonia. Prior metallic stenting of the common bile duct and cholecystectomy.  Pneumobilia and mild biliary ductal dilatation. Slight stranding around the pancreas may reflect early acute pancreatitis. Stable low-density area within the uncinate process. Sigmoid diverticulosis.  No active diverticulitis. Prostate enlargement. Electronically Signed   By: Rolm Baptise M.D.   On: 04/19/2018 22:11    Marzetta Board, MD, PhD [facility] Pager [phone]  If 7PM-7AM, please contact night-coverage [email or web address] Password [email or web address] 04/21/2018, 1:49 PM

## 2018-04-21 NOTE — Evaluation (Signed)
Physical Therapy Evaluation Patient Details Name: Jose [name] MRN: [id number] DOB: 05/29/1923 Today's Date: 04/21/2018   History of Present Illness  84 y.o. male admitted on 04/19/18 for sudden confusion, fever, and urinary incontinence.  Dx with sepsis due to big lower lobe PNA, HTN, acute on chronic anemia requiring 1 unit PRBCs), possible early pancreatitis, and hypokalemia/hypomagnesemia.  Pt with significant PMH of HTN, dyspnea, cervical radiculopathy, recent ECRP (03/30/18) and biliary stent placement.    Clinical Impression  Pt was able to ambulate the halls with RW and min guard assist.  He seems cognitively alert and conversation was normal.  He was not in any acute distress during gait.  He is agreeable to home therapy follow up at discharge.   PT to follow acutely for deficits listed below.    Follow Up Recommendations Home health PT;Supervision for mobility/OOB    Equipment Recommendations  None recommended by PT    Recommendations for Other Services   NA    Precautions / Restrictions Precautions Precautions: Fall Precaution Comments: mildly unsteady on his feet      Mobility  Bed Mobility Overal bed mobility: Needs Assistance Bed Mobility: Supine to Sit     Supine to sit: Supervision     General bed mobility comments: supervision for safety  Transfers Overall transfer level: Needs assistance Equipment used: Rolling walker (2 wheeled) Transfers: Sit to/from Stand Sit to Stand: Min guard         General transfer comment: Min guard assist for safety  Ambulation/Gait Ambulation/Gait assistance: Min assist Gait Distance (Feet): 150 Feet Assistive device: Rolling walker (2 wheeled) Gait Pattern/deviations: Step-through pattern;Shuffle;Trunk flexed     General Gait Details: Verbal cues to stay closer to RW for support, no DOE or lightheadedness reported.          Balance Overall balance assessment: Needs assistance Sitting-balance support:  Feet supported;No upper extremity supported Sitting balance-Leahy Scale: Good     Standing balance support: Bilateral upper extremity supported;No upper extremity supported Standing balance-Leahy Scale: Fair Standing balance comment: supervision in standing without UE support, everything dynamic required support from RW and or therapist.                              Pertinent Vitals/Pain Pain Assessment: No/denies pain    Home Living Family/patient expects to be discharged to:: Private residence Living Arrangements: Alone Available Help at Discharge: Family;Available 24 hours/day Type of Home: House Home Access: Ramped entrance     Home Layout: One level Home Equipment: Environmental consultant - 2 wheels Additional Comments: Family will hire 24/7 caregiver for pt    Prior Function Level of Independence: Needs assistance   Gait / Transfers Assistance Needed: walks with RW vs cane  ADL's / Homemaking Assistance Needed: Pt mod I with ADLs- family completed IADLs        Hand Dominance   Dominant Hand: Right    Extremity/Trunk Assessment   Upper Extremity Assessment Upper Extremity Assessment: Defer to OT evaluation    Lower Extremity Assessment Lower Extremity Assessment: Generalized weakness    Cervical / Trunk Assessment Cervical / Trunk Assessment: Kyphotic  Communication   Communication: HOH  Cognition Arousal/Alertness: Awake/alert Behavior During Therapy: WFL for tasks assessed/performed Overall Cognitive Status: No family/caregiver present to determine baseline cognitive functioning  General Comments: No family present, however, conversation seemed normal.              Assessment/Plan    PT Assessment Patient needs continued PT services  PT Problem List Decreased strength;Decreased balance;Decreased mobility;Decreased knowledge of use of DME;Cardiopulmonary status limiting activity       PT Treatment  Interventions DME instruction;Gait training;Functional mobility training;Therapeutic activities;Therapeutic exercise;Balance training;Neuromuscular re-education;Cognitive remediation    PT Goals (Current goals can be found in the Care Plan section)  Acute Rehab PT Goals Patient Stated Goal: to go home, agreeable to home therapy PT Goal Formulation: With patient Time For Goal Achievement: 05/05/18 Potential to Achieve Goals: Good    Frequency Min 3X/week    AM-PAC PT "6 Clicks" Mobility  Outcome Measure Help needed turning from your back to your side while in a flat bed without using bedrails?: None Help needed moving from lying on your back to sitting on the side of a flat bed without using bedrails?: None Help needed moving to and from a bed to a chair (including a wheelchair)?: A Little Help needed standing up from a chair using your arms (e.g., wheelchair or bedside chair)?: A Little Help needed to walk in hospital room?: A Little Help needed climbing 3-5 steps with a railing? : A Little 6 Click Score: 20    End of Session Equipment Utilized During Treatment: Gait belt Activity Tolerance: Patient tolerated treatment well Patient left: in chair;with call bell/phone within reach;with chair alarm set Nurse Communication: Mobility status PT Visit Diagnosis: Muscle weakness (generalized) (M62.81);Difficulty in walking, not elsewhere classified (R26.2)    Time: 2481-8590 PT Time Calculation (min) (ACUTE ONLY): 22 min   Charges:        Wells Guiles B. Usama Harkless, PT, DPT  Acute Rehabilitation #[phone] pager #[phone] office    PT Evaluation $PT Eval Moderate Complexity: 1 Mod          04/21/2018, 9:48 PM

## 2018-04-22 ENCOUNTER — Inpatient Hospital Stay (HOSPITAL_COMMUNITY)
Admission: EM | Admit: 2018-04-22 | Discharge: 2018-04-26 | DRG: 871 | Disposition: A | Discharge status: Home Health | Payer: Medicare Other | Attending: Internal Medicine | Admitting: Internal Medicine

## 2018-04-22 ENCOUNTER — Encounter (HOSPITAL_COMMUNITY): Payer: Self-pay

## 2018-04-22 ENCOUNTER — Telehealth: Payer: Self-pay | Admitting: Internal Medicine

## 2018-04-22 DIAGNOSIS — R7989 Other specified abnormal findings of blood chemistry: Secondary | ICD-10-CM

## 2018-04-22 DIAGNOSIS — R945 Abnormal results of liver function studies: Secondary | ICD-10-CM

## 2018-04-22 DIAGNOSIS — R4182 Altered mental status, unspecified: Secondary | ICD-10-CM | POA: Diagnosis not present

## 2018-04-22 DIAGNOSIS — I959 Hypotension, unspecified: Secondary | ICD-10-CM | POA: Diagnosis present

## 2018-04-22 DIAGNOSIS — Z85820 Personal history of malignant melanoma of skin: Secondary | ICD-10-CM

## 2018-04-22 DIAGNOSIS — A419 Sepsis, unspecified organism: Secondary | ICD-10-CM | POA: Diagnosis not present

## 2018-04-22 DIAGNOSIS — E440 Moderate protein-calorie malnutrition: Secondary | ICD-10-CM | POA: Diagnosis present

## 2018-04-22 DIAGNOSIS — Z66 Do not resuscitate: Secondary | ICD-10-CM | POA: Diagnosis present

## 2018-04-22 DIAGNOSIS — I10 Essential (primary) hypertension: Secondary | ICD-10-CM | POA: Diagnosis present

## 2018-04-22 DIAGNOSIS — H9190 Unspecified hearing loss, unspecified ear: Secondary | ICD-10-CM | POA: Diagnosis present

## 2018-04-22 DIAGNOSIS — D509 Iron deficiency anemia, unspecified: Secondary | ICD-10-CM | POA: Diagnosis present

## 2018-04-22 DIAGNOSIS — J189 Pneumonia, unspecified organism: Secondary | ICD-10-CM

## 2018-04-22 DIAGNOSIS — D649 Anemia, unspecified: Secondary | ICD-10-CM

## 2018-04-22 DIAGNOSIS — G2581 Restless legs syndrome: Secondary | ICD-10-CM | POA: Diagnosis present

## 2018-04-22 DIAGNOSIS — K219 Gastro-esophageal reflux disease without esophagitis: Secondary | ICD-10-CM | POA: Diagnosis present

## 2018-04-22 DIAGNOSIS — Z79899 Other long term (current) drug therapy: Secondary | ICD-10-CM

## 2018-04-22 DIAGNOSIS — Z9049 Acquired absence of other specified parts of digestive tract: Secondary | ICD-10-CM

## 2018-04-22 DIAGNOSIS — N400 Enlarged prostate without lower urinary tract symptoms: Secondary | ICD-10-CM | POA: Diagnosis present

## 2018-04-22 DIAGNOSIS — R748 Abnormal levels of other serum enzymes: Secondary | ICD-10-CM | POA: Diagnosis present

## 2018-04-22 DIAGNOSIS — E876 Hypokalemia: Secondary | ICD-10-CM | POA: Diagnosis present

## 2018-04-22 DIAGNOSIS — J181 Lobar pneumonia, unspecified organism: Secondary | ICD-10-CM | POA: Diagnosis present

## 2018-04-22 LAB — BPAM RBC
Blood Product Expiration Date: 201912102359
ISSUE DATE / TIME: 201912030916
Unit Type and Rh: 600

## 2018-04-22 LAB — BASIC METABOLIC PANEL
Anion gap: 9 (ref 5–15)
BUN: 7 mg/dL — ABNORMAL LOW (ref 8–23)
CO2: 21 mmol/L — ABNORMAL LOW (ref 22–32)
Calcium: 8.2 mg/dL — ABNORMAL LOW (ref 8.9–10.3)
Chloride: 108 mmol/L (ref 98–111)
Creatinine, Ser: 0.69 mg/dL (ref 0.61–1.24)
GFR calc Af Amer: >60 mL/min (ref >60)
GFR calc non Af Amer: >60 mL/min (ref >60)
Glucose, Bld: 93 mg/dL (ref 70–99)
POTASSIUM: 3.9 mmol/L (ref 3.5–5.1)
Sodium: 138 mmol/L (ref 135–145)

## 2018-04-22 LAB — CBC
HCT: 27 % — ABNORMAL LOW (ref 39.0–52.0)
Hemoglobin: 8.7 g/dL — ABNORMAL LOW (ref 13.0–17.0)
MCH: 32.1 pg (ref 26.0–34.0)
MCHC: 32.2 g/dL (ref 30.0–36.0)
MCV: 99.6 fL (ref 80.0–100.0)
Platelets: 104 10*3/uL — ABNORMAL LOW (ref 150–400)
RBC: 2.71 MIL/uL — AB (ref 4.22–5.81)
RDW: 17.4 % — ABNORMAL HIGH (ref 11.5–15.5)
WBC: 5.9 10*3/uL (ref 4.0–10.5)
nRBC: 0 % (ref 0.0–0.2)

## 2018-04-22 LAB — TYPE AND SCREEN
ABO/RH(D): A NEG
Antibody Screen: NEGATIVE
Unit division: 0

## 2018-04-22 LAB — CULTURE, RESPIRATORY W GRAM STAIN: Culture: NORMAL

## 2018-04-22 MED ORDER — SULFAMETHOXAZOLE-TRIMETHOPRIM 800-160 MG PO TABS: 1.0000 | ORAL_TABLET | Freq: Two times a day (BID) | ORAL | Status: DC

## 2018-04-22 MED ORDER — METOPROLOL SUCCINATE ER 25 MG PO TB24: 25.0000 mg | ORAL_TABLET | Freq: Every day | ORAL | 0 refills | Status: DC

## 2018-04-22 MED ORDER — SULFAMETHOXAZOLE-TRIMETHOPRIM 800-160 MG PO TABS: 1.0000 | ORAL_TABLET | Freq: Two times a day (BID) | ORAL | 0 refills | Status: DC

## 2018-04-22 NOTE — Care Management Note (Signed)
Case Management Note  Patient Details  Name: Navy Belay MRN: [id number] Date of Birth: [birth year]  Subjective/Objective:  For dc home today, will resume HHRN, HHPT, HHOT with [facility]. Butch Penny with [facility] notified.  Soc will begin 24 to 48hrs post dc.                  Action/Plan: DC home when ready.  Expected Discharge Date:  04/22/18               Expected Discharge Plan:  [place]  In-House Referral:     Discharge planning Services  CM Consult  Post Acute Care Choice:  Resumption of Svcs/PTA Provider Choice offered to:     DME Arranged:    DME Agency:     HH Arranged:  RN, PT, OT [place] Agency:  [place]  Status of Service:  Completed, signed off  If discussed at [place] of Stay Meetings, dates discussed:    Additional Comments:  Zenon Mayo, RN 04/22/2018, 12:13 PM

## 2018-04-22 NOTE — Care Management Important Message (Signed)
Important Message  Patient Details  Name: Jose [name] MRN: [id number] Date of Birth: Sep 20, 1923   Medicare Important Message Given:  Yes    Zenon Mayo, RN 04/22/2018, 12:08 PM

## 2018-04-22 NOTE — ED Triage Notes (Signed)
Per family pt left this afternoon around 1pm, was admitted with pneumonia and sepsis, states that he was altered today and had a fever, and high HR. Afebrile now, now AxO x 4

## 2018-04-22 NOTE — Progress Notes (Signed)
Pharmacy Antibiotic Note  Jose [name] is a 84 y.o. male admitted on 04/19/2018 with sepsis. Continues on Vancomycin and Merrem given recent (Oct 2019) h/o ESBL bacteremia.  Day # 4 of broad spectrum antibiotics Cultures negative to date WBC stable, afebrile   Plan: Continue Merrem 1g IV Q12H. Consider stopping Vancomycin    Temp (24hrs), Avg:98.1 F (36.7 C), Min:97.4 F (36.3 C), Max:99 F (37.2 C)  Recent Labs  Lab 04/19/18 1900 04/19/18 1911 04/19/18 2249 04/20/18 0128 04/20/18 0240 04/21/18 0219 04/22/18 0217  WBC 11.4*  --   --   --  10.8* 7.6 5.9  CREATININE 1.07  --   --  0.85  --  0.75 0.69  LATICACIDVEN  --  3.93* 3.48* 2.3*  --   --   --     Estimated Creatinine Clearance: 53.7 mL/min (by C-G formula based on SCr of 0.69 mg/dL).    No Known Allergies  Thank you Anette Guarneri, PharmD [phone] 04/22/2018 9:11 AM

## 2018-04-22 NOTE — Telephone Encounter (Signed)
Pt is requesting a refill on the metoprolol succinate. Pt uses [facility].

## 2018-04-22 NOTE — Consult Note (Signed)
   [facility] CM Inpatient Consult   04/22/2018  Ardean Melroy [facility] [birth year] [id number]    Patient screened for potential [facility] Care Management services due to recent and multiple hospitalizations.  Went to bedside to speak with Mr. Corpus and daughter- in- law earlier today. Mr. Schoenfeld was resting. Daughter-in-law pleasantly declines [facility] Care Management services. She reports patient's frequent readmissions have been related to his gallbladder and stents. States family is going to move in with Mr. Levan Hurst and assist. Denies any concerns with medications or with transportation. Declines post general EMMI discharge calls as well. States patient usually does not answer the phone.  Provided [facility] Care Management brochure with contact information to call should they change their minds.   Made inpatient RNCM aware [facility] services were declined.   Marthenia Rolling, MSN-Ed, RN,BSN [facility] Liaison [phone]

## 2018-04-22 NOTE — Telephone Encounter (Signed)
Spoke to [place]. Sent rx for 1 month until he sees Dr Silvio Pate. Then we can send more if he says pt is to stay on it.

## 2018-04-23 ENCOUNTER — Emergency Department (HOSPITAL_COMMUNITY): Payer: Medicare Other

## 2018-04-23 DIAGNOSIS — I959 Hypotension, unspecified: Secondary | ICD-10-CM | POA: Diagnosis present

## 2018-04-23 DIAGNOSIS — R748 Abnormal levels of other serum enzymes: Secondary | ICD-10-CM | POA: Diagnosis not present

## 2018-04-23 DIAGNOSIS — E440 Moderate protein-calorie malnutrition: Secondary | ICD-10-CM

## 2018-04-23 DIAGNOSIS — E876 Hypokalemia: Secondary | ICD-10-CM | POA: Diagnosis present

## 2018-04-23 DIAGNOSIS — Z9049 Acquired absence of other specified parts of digestive tract: Secondary | ICD-10-CM | POA: Diagnosis not present

## 2018-04-23 DIAGNOSIS — R4182 Altered mental status, unspecified: Secondary | ICD-10-CM | POA: Diagnosis present

## 2018-04-23 DIAGNOSIS — Z79899 Other long term (current) drug therapy: Secondary | ICD-10-CM | POA: Diagnosis not present

## 2018-04-23 DIAGNOSIS — A419 Sepsis, unspecified organism: Secondary | ICD-10-CM | POA: Diagnosis not present

## 2018-04-23 DIAGNOSIS — G2581 Restless legs syndrome: Secondary | ICD-10-CM | POA: Diagnosis present

## 2018-04-23 DIAGNOSIS — Z85820 Personal history of malignant melanoma of skin: Secondary | ICD-10-CM | POA: Diagnosis not present

## 2018-04-23 DIAGNOSIS — J181 Lobar pneumonia, unspecified organism: Secondary | ICD-10-CM | POA: Diagnosis present

## 2018-04-23 DIAGNOSIS — K219 Gastro-esophageal reflux disease without esophagitis: Secondary | ICD-10-CM | POA: Diagnosis present

## 2018-04-23 DIAGNOSIS — H9190 Unspecified hearing loss, unspecified ear: Secondary | ICD-10-CM | POA: Diagnosis present

## 2018-04-23 DIAGNOSIS — D509 Iron deficiency anemia, unspecified: Secondary | ICD-10-CM | POA: Diagnosis present

## 2018-04-23 DIAGNOSIS — N400 Enlarged prostate without lower urinary tract symptoms: Secondary | ICD-10-CM | POA: Diagnosis present

## 2018-04-23 DIAGNOSIS — I10 Essential (primary) hypertension: Secondary | ICD-10-CM

## 2018-04-23 DIAGNOSIS — Z66 Do not resuscitate: Secondary | ICD-10-CM | POA: Diagnosis present

## 2018-04-23 LAB — URINALYSIS, ROUTINE W REFLEX MICROSCOPIC
Bilirubin Urine: NEGATIVE
GLUCOSE, UA: 150 mg/dL — AB
Hgb urine dipstick: NEGATIVE
Ketones, ur: NEGATIVE mg/dL
Leukocytes, UA: NEGATIVE
Nitrite: NEGATIVE
Protein, ur: NEGATIVE mg/dL
Specific Gravity, Urine: 1.006 (ref 1.005–1.030)
pH: 7 (ref 5.0–8.0)

## 2018-04-23 LAB — CBC WITH DIFFERENTIAL/PLATELET
Abs Immature Granulocytes: 0.04 10*3/uL (ref 0.00–0.07)
Basophils Absolute: 0 10*3/uL (ref 0.0–0.1)
Basophils Relative: 1 %
Eosinophils Absolute: 0.2 10*3/uL (ref 0.0–0.5)
Eosinophils Relative: 3 %
HCT: 33.1 % — ABNORMAL LOW (ref 39.0–52.0)
HEMOGLOBIN: 10.6 g/dL — AB (ref 13.0–17.0)
Immature Granulocytes: 1 %
LYMPHS PCT: 7 %
Lymphs Abs: 0.4 10*3/uL — ABNORMAL LOW (ref 0.7–4.0)
MCH: 31.6 pg (ref 26.0–34.0)
MCHC: 32 g/dL (ref 30.0–36.0)
MCV: 98.8 fL (ref 80.0–100.0)
MONOS PCT: 9 %
Monocytes Absolute: 0.6 10*3/uL (ref 0.1–1.0)
Neutro Abs: 4.8 10*3/uL (ref 1.7–7.7)
Neutrophils Relative %: 79 %
Platelets: 118 10*3/uL — ABNORMAL LOW (ref 150–400)
RBC: 3.35 MIL/uL — ABNORMAL LOW (ref 4.22–5.81)
RDW: 17 % — ABNORMAL HIGH (ref 11.5–15.5)
WBC: 6 10*3/uL (ref 4.0–10.5)
nRBC: 0 % (ref 0.0–0.2)

## 2018-04-23 LAB — COMPREHENSIVE METABOLIC PANEL
ALT: 99 U/L — AB (ref 0–44)
AST: 173 U/L — ABNORMAL HIGH (ref 15–41)
Albumin: 2.6 g/dL — ABNORMAL LOW (ref 3.5–5.0)
Alkaline Phosphatase: 574 U/L — ABNORMAL HIGH (ref 38–126)
Anion gap: 11 (ref 5–15)
BUN: 7 mg/dL — ABNORMAL LOW (ref 8–23)
CO2: 21 mmol/L — ABNORMAL LOW (ref 22–32)
Calcium: 8.4 mg/dL — ABNORMAL LOW (ref 8.9–10.3)
Chloride: 102 mmol/L (ref 98–111)
Creatinine, Ser: 0.86 mg/dL (ref 0.61–1.24)
GFR calc Af Amer: >60 mL/min (ref >60)
GFR calc non Af Amer: >60 mL/min (ref >60)
GLUCOSE: 224 mg/dL — AB (ref 70–99)
Potassium: 3.7 mmol/L (ref 3.5–5.1)
Sodium: 134 mmol/L — ABNORMAL LOW (ref 135–145)
Total Bilirubin: 3.3 mg/dL — ABNORMAL HIGH (ref 0.3–1.2)
Total Protein: 6 g/dL — ABNORMAL LOW (ref 6.5–8.1)

## 2018-04-23 LAB — I-STAT CG4 LACTIC ACID, ED: Lactic Acid, Venous: 2.73 mmol/L (ref 0.5–1.9)

## 2018-04-23 LAB — LIPASE, BLOOD: Lipase: 27 U/L (ref 11–51)

## 2018-04-23 LAB — LACTIC ACID, PLASMA: Lactic Acid, Venous: 0.9 mmol/L (ref 0.5–1.9)

## 2018-04-23 MED ORDER — ENOXAPARIN SODIUM 40 MG/0.4ML ~~LOC~~ SOLN
40.0000 mg | SUBCUTANEOUS | Status: DC
  Administered 2018-04-24 – 2018-04-25 (×2): 40 mg via SUBCUTANEOUS
  Filled 2018-04-23 (×2): qty 0.4

## 2018-04-23 MED ORDER — SODIUM CHLORIDE 0.9 % IV SOLN
2.0000 g | Freq: Once | INTRAVENOUS | Status: AC
  Administered 2018-04-23: 2 g via INTRAVENOUS
  Filled 2018-04-23: qty 2

## 2018-04-23 MED ORDER — ACETAMINOPHEN 650 MG RE SUPP: 650.0000 mg | Freq: Four times a day (QID) | RECTAL | Status: DC | PRN

## 2018-04-23 MED ORDER — SODIUM CHLORIDE 0.9 % IV SOLN
INTRAVENOUS | Status: DC
  Administered 2018-04-23 – 2018-04-25 (×4): via INTRAVENOUS

## 2018-04-23 MED ORDER — FERROUS SULFATE 325 (65 FE) MG PO TABS
325.0000 mg | ORAL_TABLET | Freq: Every day | ORAL | Status: DC
  Administered 2018-04-24 – 2018-04-26 (×3): 325 mg via ORAL
  Filled 2018-04-23 (×3): qty 1

## 2018-04-23 MED ORDER — ONDANSETRON HCL 4 MG/2ML IJ SOLN: 4.0000 mg | Freq: Four times a day (QID) | INTRAMUSCULAR | Status: DC | PRN

## 2018-04-23 MED ORDER — SODIUM CHLORIDE 0.9 % IV SOLN
2.0000 g | INTRAVENOUS | Status: DC
  Administered 2018-04-23 – 2018-04-25 (×3): 2 g via INTRAVENOUS
  Filled 2018-04-23 (×4): qty 2

## 2018-04-23 MED ORDER — SODIUM CHLORIDE 0.9 % IV SOLN
INTRAVENOUS | Status: DC
  Administered 2018-04-23: 07:00:00 via INTRAVENOUS

## 2018-04-23 MED ORDER — ONDANSETRON HCL 4 MG PO TABS: 4.0000 mg | ORAL_TABLET | Freq: Four times a day (QID) | ORAL | Status: DC | PRN

## 2018-04-23 MED ORDER — ENSURE ENLIVE PO LIQD
6.0000 mL | Freq: Two times a day (BID) | ORAL | Status: DC
  Administered 2018-04-24: 6 mL via ORAL

## 2018-04-23 MED ORDER — METRONIDAZOLE IN NACL 5-0.79 MG/ML-% IV SOLN
500.0000 mg | Freq: Three times a day (TID) | INTRAVENOUS | Status: DC
  Administered 2018-04-23 – 2018-04-25 (×8): 500 mg via INTRAVENOUS
  Filled 2018-04-23 (×8): qty 100

## 2018-04-23 MED ORDER — SODIUM CHLORIDE 0.9 % IV BOLUS (SEPSIS)
1000.0000 mL | Freq: Once | INTRAVENOUS | Status: AC
  Administered 2018-04-23: 1000 mL via INTRAVENOUS

## 2018-04-23 MED ORDER — ADULT MULTIVITAMIN W/MINERALS CH
1.0000 | ORAL_TABLET | Freq: Every day | ORAL | Status: DC
  Administered 2018-04-23 – 2018-04-26 (×4): 1 via ORAL
  Filled 2018-04-23 (×4): qty 1

## 2018-04-23 MED ORDER — METOPROLOL SUCCINATE ER 25 MG PO TB24
25.0000 mg | ORAL_TABLET | Freq: Every day | ORAL | Status: DC
  Administered 2018-04-23 – 2018-04-26 (×4): 25 mg via ORAL
  Filled 2018-04-23 (×4): qty 1

## 2018-04-23 MED ORDER — VANCOMYCIN HCL 10 G IV SOLR
1250.0000 mg | Freq: Once | INTRAVENOUS | Status: AC
  Administered 2018-04-23: 1250 mg via INTRAVENOUS
  Filled 2018-04-23: qty 1250

## 2018-04-23 MED ORDER — TAMSULOSIN HCL 0.4 MG PO CAPS
0.4000 mg | ORAL_CAPSULE | Freq: Every day | ORAL | Status: DC
  Administered 2018-04-24 – 2018-04-26 (×3): 0.4 mg via ORAL
  Filled 2018-04-23 (×3): qty 1

## 2018-04-23 MED ORDER — VANCOMYCIN HCL IN DEXTROSE 1-5 GM/200ML-% IV SOLN
1000.0000 mg | Freq: Once | INTRAVENOUS | Status: DC
  Filled 2018-04-23: qty 200

## 2018-04-23 MED ORDER — CYANOCOBALAMIN 500 MCG PO TABS
500.0000 ug | ORAL_TABLET | Freq: Every day | ORAL | Status: DC
  Administered 2018-04-24 – 2018-04-26 (×3): 500 ug via ORAL
  Filled 2018-04-23 (×4): qty 1

## 2018-04-23 MED ORDER — ACETAMINOPHEN 325 MG PO TABS
650.0000 mg | ORAL_TABLET | Freq: Four times a day (QID) | ORAL | Status: DC | PRN
  Administered 2018-04-24: 650 mg via ORAL
  Filled 2018-04-23: qty 2

## 2018-04-23 NOTE — ED Notes (Signed)
Pt at bedside eating meal provided by family

## 2018-04-23 NOTE — H&P (Signed)
TRH H&P   Patient Demographics:    Jose [name], is a 84 y.o. male  MRN: [id number]   DOB - November 05, 1923  Admit Date - 04/22/2018  Outpatient Primary MD for the patient is [name], MD  Referring MD: Dr. Roxanne Mins  Outpatient Specialists: [facility]  Patient coming from: Home  Chief Complaint  Patient presents with  . Altered Mental Status      HPI:   History obtained from daughter-in-law at bedside as patient is extremely hard of hearing.  Jose [name]  is a 84 y.o. male, with history of hypertension, hospitalized in September for acute cholecystitis, underwent laparoscopic cholecystectomy (after being managed with a cholecystostomy tube drainage for several weeks).  Patient then had ERCP on 03/13/2018 for ascending cholangitis and bacteremia showing distal CBD stricture for which sphincterotomy was done and a plastic stent placed.  Patient was hospitalized 4 weeks back with epigastric discomfort, transaminitis and elevated alkaline phosphatase with dilatation of CBD on CT scan.  GI performed ERCP and found CBD stent in the duodenum, distally displaced.  The stent was removed and replaced with a bare-metal stent.  Patient was discharged home about 3 weeks back. He then got readmitted 3 days back with sepsis secondary to pneumonia.  LFTs were normal during recent hospitalization and alkaline phosphatase were trending down in the 100s.  He was discharged home yesterday with a course of Bactrim (total 7 days). Family took him home but in the evening he was found to be having chills and weak.  Daughter-in-law took his temperature and was 87 F.  She also found him to have slurred speech which he says is common when he has fever and sepsis-like symptoms.  EMS was called who again noted him to have fever of 103 and soft blood pressure and tachycardic. In the ED his blood pressure was  97/59 mmHg, tachypneic to 22, heart rate up to 127, afebrile and maintaining O2 sat in the mid 90s on room air.  Blood work showed WBC of 6K, hemoglobin of 10.6, platelets of 118, sodium of 134, elevated bilirubin of 3.3 (was 0.92 days ago), AST of 173 and ALT of 99, alkaline phosphatase of 573 (all elevated from 2 days back). Lactic acid was elevated to 2.73.  UA  negative for infiltrate.  Chest x-ray shows bilateral infiltrate. Based on clinical presentation of fever, hypotension, tachycardia, tachypnea and elevated lactic acid patient met criteria for sepsis. Patient given 2 L normal saline bolus, urine and blood culture sent, given empiric vancomycin and cefepime and placed on empiric Flagyl.  Hospitalist consulted for admission for sepsis of unclear etiology.   Review of systems:    In addition to the HPI above, (history limited as patient extremely hard of hearing and not fully oriented.) Fever+++, chills++, confusion and slurred speech No Headache, No changes with Vision or hearing, No problems swallowing  food or Liquids, No Chest pain, Cough or Shortness of Breath, No Abdominal pain, No Nausea or vomiting, Bowel movements are regular, No Blood in stool or Urine, No dysuria, No new skin rashes or bruises, No new joints pains-aches,  Generalized weakness, tingling, numbness in any extremity, No recent weight gain or loss, No polyuria, polydypsia or polyphagia, No significant Mental Stressors.    With Past History of the following :    Past Medical History:  Diagnosis Date  . Anxiety   . Back problem 1960  . BPH (benign prostatic hypertrophy)   . Cervical radiculopathy    with neuropathy  . Dyspnea   . GERD (gastroesophageal reflux disease)   . Hypertension   . Hypokalemia   . Melanoma in situ ([place]) 12/2016   neck      Past Surgical History:  Procedure Laterality Date  . BILIARY STENT PLACEMENT  03/30/2018   Procedure: BILIARY STENT PLACEMENT;  Surgeon: Clarene Essex,  MD;  Location: [place];  Service: Endoscopy;;  . CATARACT EXTRACTION     OD  . CHOLECYSTECTOMY N/A 01/26/2018   Procedure: LAPAROSCOPIC CHOLECYSTECTOMY;  Surgeon: Coralie Keens, MD;  Location: [place];  Service: General;  Laterality: N/A;  . ENDOSCOPIC RETROGRADE CHOLANGIOPANCREATOGRAPHY (ERCP) WITH PROPOFOL N/A 03/13/2018   Procedure: ENDOSCOPIC RETROGRADE CHOLANGIOPANCREATOGRAPHY (ERCP) WITH PROPOFOL;  Surgeon: Lucilla Lame, MD;  Location: [facility] ENDOSCOPY;  Service: Endoscopy;  Laterality: N/A;  . ERCP N/A 03/29/2018   Procedure: ENDOSCOPIC RETROGRADE CHOLANGIOPANCREATOGRAPHY (ERCP);  Surgeon: Ronnette Juniper, MD;  Location: [place];  Service: Gastroenterology;  Laterality: N/A;  . ERCP N/A 03/30/2018   Procedure: ENDOSCOPIC RETROGRADE CHOLANGIOPANCREATOGRAPHY (ERCP);  Surgeon: Clarene Essex, MD;  Location: [place];  Service: Endoscopy;  Laterality: N/A;  . IR PERC CHOLECYSTOSTOMY  12/28/2017  . LAPAROSCOPIC CHOLECYSTECTOMY  01/26/2018  . MELANOMA EXCISION  12/2016   in situ---Dr Dasher  . SPHINCTEROTOMY  03/30/2018   Procedure: SPHINCTEROTOMY;  Surgeon: Clarene Essex, MD;  Location: [facility] ENDOSCOPY;  Service: Endoscopy;;  . STENT REMOVAL  03/29/2018   Procedure: STENT REMOVAL;  Surgeon: Ronnette Juniper, MD;  Location[facility] ENDOSCOPY;  Service: Gastroenterology;;  . TONSILLECTOMY        Social History:     Social History   Tobacco Use  . Smoking status: Never Smoker  . Smokeless tobacco: Never Used  Substance Use Topics  . Alcohol use: No     Lives -home  Mobility -ambulates with assistance     Family History :     Family History  Problem Relation Age of Onset  . Cancer Son   . Coronary artery disease Neg Hx   . Diabetes Neg Hx       Home Medications:   Prior to Admission medications   Medication Sig Start Date End Date Taking? Authorizing Provider  feeding supplement, ENSURE ENLIVE, (ENSURE ENLIVE) LIQD Take 6 mLs by mouth 2 (two) times daily between meals.  03/17/18  Yes Wieting, Richard, MD  ferrous sulfate 325 (65 FE) MG EC tablet Take 325 mg by mouth daily with breakfast.   Yes [provider]  gabapentin (NEURONTIN) 600 MG tablet TAKE 1 TABLET (600 MG TOTAL) BY MOUTH 2 (TWO) TIMES DAILY. Patient taking differently: Take 600 mg by mouth 2 (two) times daily.  07/21/17  Yes [name], MD  ketoconazole (NIZORAL) 2 % cream Apply 1 application topically 2 (two) times daily as needed for irritation. 05/07/17  Yes Viviana Simpler I, MD  metoprolol succinate (TOPROL-XL) 25 MG 24 hr  tablet Take 1 tablet (25 mg total) by mouth daily. 04/22/18  Yes [name], MD  Multiple Vitamin (MULTIVITAMIN WITH MINERALS) TABS tablet Take 1 tablet by mouth daily. 03/17/18  Yes Wieting, Richard, MD  sulfamethoxazole-trimethoprim (BACTRIM DS,SEPTRA DS) 800-160 MG tablet Take 1 tablet by mouth 2 (two) times daily for 5 days. 04/22/18 04/27/18 Yes Lavina Hamman, MD  tamsulosin (FLOMAX) 0.4 MG CAPS capsule TAKE 1 CAPSULE (0.4 MG TOTAL) BY MOUTH DAILY. Patient taking differently: Take 0.4 mg by mouth daily.  07/21/17  Yes Viviana Simpler I, MD  triamcinolone cream (KENALOG) 0.1 % Apply 1 application topically 2 (two) times daily as needed. Patient taking differently: Apply 1 application topically 2 (two) times daily as needed (irritation).  09/05/16  Yes [name], MD  vitamin B-12 (CYANOCOBALAMIN) 500 MCG tablet Take 500 mcg by mouth daily.   Yes [provider]     Allergies:    No Known Allergies   Physical Exam:   Vitals  Blood pressure 120/63, pulse 76, temperature 98.7 F (37.1 C), temperature source Oral, resp. rate 18, SpO2 94 %.   General: Elderly male lying in bed, appears fatigued, not in distress, extremely hard of hearing HEENT: Pupils reactive bilaterally, EOMI, no pallor, no icterus noted, dry mucosa, supple neck, no cervical lymphadenopathy Chest: Fine bibasilar crackles, no rhonchi or wheeze CVS: Normal S1 and S2,  no murmurs rub or gallop GI: Soft, nondistended, bowel sounds present, no right upper quadrant tenderness, bowel sounds present Musculoskeletal: Warm, no edema CNS: Alert and awake, oriented x1-2, very hard of hearing      Data Review:    CBC Recent Labs  Lab 04/19/18 1900 04/20/18 0240 04/21/18 0219 04/21/18 2015 04/22/18 0217 04/23/18 0010  WBC 11.4* 10.8* 7.6  --  5.9 6.0  HGB 10.1* 7.8* 6.9* 9.4* 8.7* 10.6*  HCT 32.4* 24.7* 22.8* 28.4* 27.0* 33.1*  PLT 165 106* 97*  --  104* 118*  MCV 103.8* 102.5* 104.1*  --  99.6 98.8  MCH 32.4 32.4 31.5  --  32.1 31.6  MCHC 31.2 31.6 30.3  --  32.2 32.0  RDW 17.2* 17.3* 17.3*  --  17.4* 17.0*  LYMPHSABS 0.3* 1.0  --   --   --  0.4*  MONOABS 0.4 0.6  --   --   --  0.6  EOSABS 0.0 0.1  --   --   --  0.2  BASOSABS 0.0 0.0  --   --   --  0.0   ------------------------------------------------------------------------------------------------------------------  Chemistries  Recent Labs  Lab 04/19/18 1900 04/20/18 0128 04/20/18 0240 04/21/18 0219 04/22/18 0217 04/23/18 0010  NA 133* 133*  --  137 138 134*  K 3.4* 3.4*  --  3.4* 3.9 3.7  CL 99 107  --  110 108 102  CO2 21* 19*  --  22 21* 21*  GLUCOSE 145* 132*  --  101* 93 224*  BUN 17 15  --  11 7* 7*  CREATININE 1.07 0.85  --  0.75 0.69 0.86  CALCIUM 8.7* 7.0*  --  7.6* 8.2* 8.4*  MG  --  1.4*  --  1.9  --   --   AST 39  --  31 22  --  173*  ALT 38  --  29 26  --  99*  ALKPHOS 266*  --  176* 161*  --  574*  BILITOT 1.9*  --  1.3* 0.9  --  3.3*   ------------------------------------------------------------------------------------------------------------------  estimated creatinine clearance is 49.9 mL/min (by C-G formula based on SCr of 0.86 mg/dL). ------------------------------------------------------------------------------------------------------------------ No results for input(s): TSH, T4TOTAL, T3FREE, THYROIDAB in the last 72 hours.  Invalid input(s):  FREET3  Coagulation profile Recent Labs  Lab 04/19/18 1900  INR 1.31   ------------------------------------------------------------------------------------------------------------------- No results for input(s): DDIMER in the last 72 hours. -------------------------------------------------------------------------------------------------------------------  Cardiac Enzymes No results for input(s): CKMB, TROPONINI, MYOGLOBIN in the last 168 hours.  Invalid input(s): CK ------------------------------------------------------------------------------------------------------------------ No results found for: BNP   ---------------------------------------------------------------------------------------------------------------  Urinalysis    Component Value Date/Time   COLORURINE YELLOW 04/23/2018 [place] 04/23/2018 0239   LABSPEC 1.006 04/23/2018 0239   PHURINE 7.0 04/23/2018 0239   GLUCOSEU 150 (A) 04/23/2018 0239   HGBUR NEGATIVE 04/23/2018 0239   BILIRUBINUR NEGATIVE 04/23/2018 0239   KETONESUR NEGATIVE 04/23/2018 0239   PROTEINUR NEGATIVE 04/23/2018 0239   NITRITE NEGATIVE 04/23/2018 0239   LEUKOCYTESUR NEGATIVE 04/23/2018 0239    ----------------------------------------------------------------------------------------------------------------   Imaging Results:    Dg Chest 2 View  Result Date: 04/23/2018 CLINICAL DATA:  Fever EXAM: CHEST - 2 VIEW COMPARISON:  04/19/2018 FINDINGS: Heart is normal size. Bibasilar atelectasis or infiltrates. Small effusions. No acute bony abnormality. IMPRESSION: Small bilateral effusions with bibasilar atelectasis or infiltrates. Electronically Signed   By: Rolm Baptise M.D.   On: 04/23/2018 00:30    My personal review of EKG: Sinus tachycardia with PACs, poor RV progression.  Normal QT   Assessment & Plan:    Principal Problem:   Sepsis ([place]) Unclear etiology.  Recently hospitalized for bilateral lobar pneumonia with  sepsis.  Treated with empiric antibiotic and discharged on Bactrim.  Returned same evening with sepsis. Blood cultures and urine culture sent from the ED. Placed on empiric cefepime and Flagyl.  Patient still has some bilateral lower lobe infiltrate. will order a swallow evaluation.   flu PCR and respiratory panel 2 days ago was negative so will not repeat. Continue gentle hydration, monitor lactate and follow cultures.  Active Problems: Transaminitis History of cholangitis/CBD stricture status post plastic stent in October followed by exchange and bare-metal stent 3 weeks back for stent displacement.  Patient had cholecystectomy in September.  Recent CT abdomen showed early acute pancreatitis.  He does not have any abdominal pain and lipase is normal. I discussed with [facility] (Dr. Oletta Lamas).  Since patient has a new bare-metal stent which is only about 79 weeks old he suspects stent displacement or stenosis is unlikely.  Also with a metal stent he may not be able to get an MRCP. Since white count is normal with no abdominal tenderness he recommends to monitor with antibiotics. [facility] will evaluate patient.  Monitor LFTs closely.  Discontinue gabapentin.    Essential hypertension, benign Hold metoprolol since patient was hypotensive on presentation.     Malnutrition of moderate degree Resume supplement.  Recent sepsis with lobar pneumonia Still has some bilateral infiltrate on chest x-ray.  I will obtain a swallow evaluation.    BPH (benign prostatic hyperplasia) Continue Flomax.  Normocytic/iron deficiency anemia Continue iron supplement       DVT Prophylaxis: Lovenox  AM Labs Ordered, also please review Full Orders  Family Communication: Admission, patients condition and plan of care including tests being ordered have been discussed with the patient and his daughter-in-law at bedside   Code Status DNR  Likely DC to home with home health versus SNF based on hospital  course.  Condition GUARDED    Consults called:  [facility]  Admission status: Inpatient   Patient has acute sepsis of unclear etiology, presented with fever and hypotension, tachycardia, tachypnea with elevated lactic acid and transaminitis.  He is requiring IV hydration, close monitoring of his LFTs, cultures and empiric IV antibiotics.  Patient needs to be monitored for at least 2 midnights.  Time spent in minutes : 70   Khiley Lieser M.D on 04/23/2018 at 8:48 AM  Between 7am to 7pm - Pager - [phone]. After 7pm go to [email or web address] - password [facility]  [facility] - Office  [phone]

## 2018-04-23 NOTE — ED Notes (Signed)
Fluids still infusing 2nd lactic to be collected when complete

## 2018-04-23 NOTE — ED Provider Notes (Signed)
[facility] EMERGENCY DEPARTMENT Provider Note   CSN: [id number] Arrival date & time: 04/22/18  2315     History   Chief Complaint Chief Complaint  Patient presents with  . Altered Mental Status    HPI Jose [name] is a 84 y.o. male.  The history is provided by a relative.  He has history of hypertension, obstructive jaundice, GERD and was discharged from the hospital this morning after being admitted for sepsis.  He was doing well until this evening when family noted that his condition was deteriorating.  His speech was slurred although he continued to be oriented.  He was noted to have fever of 103.1.  He was also noted to have very rapid heart rate.  His mental state has returned to baseline and his speech is back to normal.  He did not receive any antipyretics.  There has been no cough and no vomiting or diarrhea.  Past Medical History:  Diagnosis Date  . Anxiety   . Back problem 1960  . BPH (benign prostatic hypertrophy)   . Cervical radiculopathy    with neuropathy  . Dyspnea   . GERD (gastroesophageal reflux disease)   . Hypertension   . Hypokalemia   . Melanoma in situ [facility]) 12/2016   neck    Patient Active Problem List   Diagnosis Date Noted  . Lobar pneumonia ([place]) 04/20/2018  . Sepsis due to pneumonia ([place]) 04/20/2018  . Sepsis ([place]) 04/19/2018  . Obstructive jaundice 03/28/2018  . Obstructive jaundice 03/28/2018  . History of ESBL E. coli infection 03/28/2018  . Acute pancreatitis   . Hyperbilirubinemia   . Fatigue 03/26/2018  . E. coli sepsis ([place]) 03/24/2018  . Tachycardia 03/24/2018  . Malnutrition of moderate degree 03/14/2018  . Jaundice   . Pancreatitis due to biliary obstruction 03/12/2018  . Malnutrition of mild degree ([place]) 01/06/2018  . Elevated liver enzymes 01/04/2018  . Melanoma of neck ([place])   . Rash 09/05/2016  . Neuropathy 05/07/2016  . Anemia 05/11/2015  . Preventative health care 04/29/2014  . Advanced  directives, counseling/discussion 04/29/2014  . Osteoarthrosis involving multiple sites 10/31/2010  . Osteoarthrosis of knee 10/31/2010  . ACTINIC KERATOSIS 12/04/2007  . BPH with obstruction/lower urinary tract symptoms 10/01/2006  . Essential hypertension, benign 09/17/2006  . GERD 09/17/2006  . Cervical radiculopathy 09/17/2006    Past Surgical History:  Procedure Laterality Date  . BILIARY STENT PLACEMENT  03/30/2018   Procedure: BILIARY STENT PLACEMENT;  Surgeon: Clarene Essex, MD;  Location: [place];  Service: Endoscopy;;  . CATARACT EXTRACTION     OD  . CHOLECYSTECTOMY N/A 01/26/2018   Procedure: LAPAROSCOPIC CHOLECYSTECTOMY;  Surgeon: Coralie Keens, MD;  Location: [place];  Service: General;  Laterality: N/A;  . ENDOSCOPIC RETROGRADE CHOLANGIOPANCREATOGRAPHY (ERCP) WITH PROPOFOL N/A 03/13/2018   Procedure: ENDOSCOPIC RETROGRADE CHOLANGIOPANCREATOGRAPHY (ERCP) WITH PROPOFOL;  Surgeon: Lucilla Lame, MD;  Location: [facility] ENDOSCOPY;  Service: Endoscopy;  Laterality: N/A;  . ERCP N/A 03/29/2018   Procedure: ENDOSCOPIC RETROGRADE CHOLANGIOPANCREATOGRAPHY (ERCP);  Surgeon: Ronnette Juniper, MD;  Location: [place];  Service: Gastroenterology;  Laterality: N/A;  . ERCP N/A 03/30/2018   Procedure: ENDOSCOPIC RETROGRADE CHOLANGIOPANCREATOGRAPHY (ERCP);  Surgeon: Clarene Essex, MD;  Location: [place];  Service: Endoscopy;  Laterality: N/A;  . IR PERC CHOLECYSTOSTOMY  12/28/2017  . LAPAROSCOPIC CHOLECYSTECTOMY  01/26/2018  . MELANOMA EXCISION  12/2016   in situ---Dr Dasher  . SPHINCTEROTOMY  03/30/2018   Procedure: SPHINCTEROTOMY;  Surgeon: Clarene Essex, MD;  Location:  [place] ENDOSCOPY;  Service: Endoscopy;;  . STENT REMOVAL  03/29/2018   Procedure: STENT REMOVAL;  Surgeon: Ronnette Juniper, MD;  Location: [facility] ENDOSCOPY;  Service: Gastroenterology;;  . TONSILLECTOMY          Home Medications    Prior to Admission medications   Medication Sig Start Date End Date Taking? Authorizing  Provider  feeding supplement, ENSURE ENLIVE, (ENSURE ENLIVE) LIQD Take 6 mLs by mouth 2 (two) times daily between meals. 03/17/18   Loletha Grayer, MD  ferrous sulfate 325 (65 FE) MG EC tablet Take 325 mg by mouth daily with breakfast.    [provider]  gabapentin (NEURONTIN) 600 MG tablet TAKE 1 TABLET (600 MG TOTAL) BY MOUTH 2 (TWO) TIMES DAILY. Patient taking differently: Take 600 mg by mouth 2 (two) times daily.  07/21/17   [name], MD  ketoconazole (NIZORAL) 2 % cream Apply 1 application topically 2 (two) times daily as needed for irritation. 05/07/17   [name], MD  metoprolol succinate (TOPROL-XL) 25 MG 24 hr tablet Take 1 tablet (25 mg total) by mouth daily. 04/22/18   [name], MD  Multiple Vitamin (MULTIVITAMIN WITH MINERALS) TABS tablet Take 1 tablet by mouth daily. 03/17/18   Loletha Grayer, MD  sulfamethoxazole-trimethoprim (BACTRIM DS,SEPTRA DS) 800-160 MG tablet Take 1 tablet by mouth 2 (two) times daily for 5 days. 04/22/18 04/27/18  Lavina Hamman, MD  tamsulosin (FLOMAX) 0.4 MG CAPS capsule TAKE 1 CAPSULE (0.4 MG TOTAL) BY MOUTH DAILY. Patient taking differently: Take 0.4 mg by mouth daily.  07/21/17   [name], MD  triamcinolone cream (KENALOG) 0.1 % Apply 1 application topically 2 (two) times daily as needed. Patient taking differently: Apply 1 application topically 2 (two) times daily as needed (irritation).  09/05/16   [name], MD  vitamin B-12 (CYANOCOBALAMIN) 500 MCG tablet Take 500 mcg by mouth daily.    [provider]    Family History Family History  Problem Relation Age of Onset  . Cancer Son   . Coronary artery disease Neg Hx   . Diabetes Neg Hx     Social History Social History   Tobacco Use  . Smoking status: Never Smoker  . Smokeless tobacco: Never Used  Substance Use Topics  . Alcohol use: No  . Drug use: No     Allergies   Patient has no known allergies.   Review of  Systems Review of Systems  All other systems reviewed and are negative.    Physical Exam Updated Vital Signs BP 124/71 (BP Location: Right Arm)   Pulse (!) 104   Temp 98.7 F (37.1 C) (Oral)   Resp 20   SpO2 93%   Physical Exam  Nursing note and vitals reviewed.  84 year old male, resting comfortably and in no acute distress. Vital signs are significant for elevated heart rate. Oxygen saturation is 93%, which is normal. Head is normocephalic and atraumatic. PERRLA, EOMI. Oropharynx is clear. Neck is nontender and supple without adenopathy or JVD. Back is nontender and there is no CVA tenderness. Lungs are clear without rales, wheezes, or rhonchi. Chest is nontender. Heart has regular rate and rhythm without murmur. Abdomen is soft, flat, nontender without masses or hepatosplenomegaly and peristalsis is normoactive. Extremities have no cyanosis or edema, full range of motion is present. Skin is warm and dry without rash. Neurologic: Mental status is normal, cranial nerves are intact, there are no motor or sensory deficits.  ED Treatments / Results  Labs (all labs ordered are listed, but only abnormal results are displayed) Labs Reviewed  COMPREHENSIVE METABOLIC PANEL - Abnormal; Notable for the following components:      Result Value   Sodium 134 (*)    CO2 21 (*)    Glucose, Bld 224 (*)    BUN 7 (*)    Calcium 8.4 (*)    Total Protein 6.0 (*)    Albumin 2.6 (*)    AST 173 (*)    ALT 99 (*)    Alkaline Phosphatase 574 (*)    Total Bilirubin 3.3 (*)    All other components within normal limits  CBC WITH DIFFERENTIAL/PLATELET - Abnormal; Notable for the following components:   RBC 3.35 (*)    Hemoglobin 10.6 (*)    HCT 33.1 (*)    RDW 17.0 (*)    Platelets 118 (*)    Lymphs Abs 0.4 (*)    All other components within normal limits  URINALYSIS, ROUTINE W REFLEX MICROSCOPIC - Abnormal; Notable for the following components:   Glucose, UA 150 (*)    All other  components within normal limits  I-STAT CG4 LACTIC ACID, ED - Abnormal; Notable for the following components:   Lactic Acid, Venous 2.73 (*)    All other components within normal limits  CULTURE, BLOOD (ROUTINE X 2)  CULTURE, BLOOD (ROUTINE X 2)  URINE CULTURE  I-STAT CG4 LACTIC ACID, ED    EKG EKG Interpretation  Date/Time:  Thursday April 23 2018 01:01:18 EST Ventricular Rate:  120 PR Interval:    QRS Duration: 79 QT Interval:  310 QTC Calculation: 438 R Axis:   -35 Text Interpretation:  Sinus tachycardia with Premature atrial complexes Abnormal R-wave progression, early transition Inferior infarct, old When compared with ECG of 04/19/2018, Premature atrial complexes are now present Confirmed by Delora Fuel ([id number]) on 04/23/2018 1:29:41 AM   Radiology Dg Chest 2 View  Result Date: 04/23/2018 CLINICAL DATA:  Fever EXAM: CHEST - 2 VIEW COMPARISON:  04/19/2018 FINDINGS: Heart is normal size. Bibasilar atelectasis or infiltrates. Small effusions. No acute bony abnormality. IMPRESSION: Small bilateral effusions with bibasilar atelectasis or infiltrates. Electronically Signed   By: Rolm Baptise M.D.   On: 04/23/2018 00:30    Procedures Procedures  CRITICAL CARE Performed by: Delora Fuel Total critical care time: 40 minutes Critical care time was exclusive of separately billable procedures and treating other patients. Critical care was necessary to treat or prevent imminent or life-threatening deterioration. Critical care was time spent personally by me on the following activities: development of treatment plan with patient and/or surrogate as well as nursing, discussions with consultants, evaluation of patient's response to treatment, examination of patient, obtaining history from patient or surrogate, ordering and performing treatments and interventions, ordering and review of laboratory studies, ordering and review of radiographic studies, pulse oximetry and re-evaluation of  patient's condition.  Medications Ordered in ED Medications  metroNIDAZOLE (FLAGYL) IVPB 500 mg (0 mg Intravenous Stopped 04/23/18 0237)  vancomycin (VANCOCIN) 1,250 mg in sodium chloride 0.9 % 250 mL IVPB (1,250 mg Intravenous New Bag/Given 04/23/18 0236)  ceFEPIme (MAXIPIME) 2 g in sodium chloride 0.9 % 100 mL IVPB (0 g Intravenous Stopped 04/23/18 0236)  sodium chloride 0.9 % bolus 1,000 mL (1,000 mLs Intravenous New Bag/Given 04/23/18 0124)    And  sodium chloride 0.9 % bolus 1,000 mL (0 mLs Intravenous Stopped 04/23/18 0236)     Initial Impression / Assessment and Plan /  ED Course  I have reviewed the triage vital signs and the nursing notes.  Pertinent labs & imaging results that were available during my care of the patient were reviewed by me and considered in my medical decision making (see chart for details).  Fever with tachycardia and transient alteration in mental status consistent with sepsis.  Lactic acid level has come back mildly elevated.  Heart rate is significantly elevated while I am at the bedside with frequent PACs noted on monitor.  Because of report of fever at home and altered mentation and tachycardia, code sepsis was initiated.  He is started on early, goal-directed fluid therapy and started on empiric antibiotics for sepsis of undetermined cause.  Old records are reviewed confirming recent hospitalization for sepsis.  Cultures were negative.  Labs show bilirubin is continuing to fall, but alkaline phosphatase is now trending up.  Anemia is present which is actually improved over recent hospitalization.  He continues to be nontoxic in appearance.  Case is discussed with Dr. Tamala Julian of [facility] hospitalist who agrees to admit the patient.  Final Clinical Impressions(s) / ED Diagnoses   Final diagnoses:  Sepsis due to undetermined organism ([place])  Elevated liver function tests  Normochromic normocytic anemia    ED Discharge Orders    None       Delora Fuel,  MD 53/66/44 [phone]

## 2018-04-23 NOTE — Consult Note (Signed)
[facility] CONSULT Reason for consult: Elevated liver test Referring Physician: [facility].  Primary GI: Dr. Landry Corporal Jose [name] is an 84 y.o. male.  HPI: Patient was discharged from the hospital yesterday morning after being admitted from sepsis.He was discharged on Septra DS twice daily.  He continued all of his other medications.  According to the wife he did wellFor the whole day until yesterday evening when he began to have chills when she tested him and his temperature was 103 and he was brought back to the emergency room.  In the emergency room his pressure was low and he was tachypneic and had a heart rate of 127.  His labs were remarkable for a bilirubin of 3.3 elevation of AST and ALT with alk phos all elevated at 573.  His lactic acid was elevated as well.  Chest x-ray showed bilateral infiltrates.  Feeling was that he had sepsis of unknown cause.  We are asked to see him about his elevated liver test.  He denies nausea and vomiting and denies abdominal pain. It is notable that CT scan that he had during his recent admission showed consolidation in the lower lobes compatible with pneumonia and showed a metallic biliary stent to be in good position. It is notable that the patient has been seen by our GI service.  Patient has had 3 ERCPs since 10/25.  The first was done at [facility] with a small sphincterotomy and placement of a plastic stent due to what was felt to be ascending cholangitis.  Patient's liver tests worsen several days later to attempt here in [place] last 11/11 by Dr. Watt Climes who extended his sphincterotomy with pus coming out of the CBD.  A balloon was pulled through there was no stone material and a 10 French 4 cm covered metal stent was placed.  During his admission earlier this week a CT scan showed that stent to be in good position.  Patient's LFTs prior to his recent discharge had come back to nearly normal.  Again he denies any nausea or  abdominal pain since his discharge.  It is also notable that his WBC has been normal   Past Medical History:  Diagnosis Date  . Anxiety   . Back problem 1960  . BPH (benign prostatic hypertrophy)   . Cervical radiculopathy    with neuropathy  . Dyspnea   . GERD (gastroesophageal reflux disease)   . Hypertension   . Hypokalemia   . Melanoma in situ ([place]) 12/2016   neck    Past Surgical History:  Procedure Laterality Date  . BILIARY STENT PLACEMENT  03/30/2018   Procedure: BILIARY STENT PLACEMENT;  Surgeon: Jose Essex, MD;  Location: [place];  Service: Endoscopy;;  . CATARACT EXTRACTION     OD  . CHOLECYSTECTOMY N/A 01/26/2018   Procedure: LAPAROSCOPIC CHOLECYSTECTOMY;  Surgeon: Coralie Keens, MD;  Location: [place];  Service: General;  Laterality: N/A;  . ENDOSCOPIC RETROGRADE CHOLANGIOPANCREATOGRAPHY (ERCP) WITH PROPOFOL N/A 03/13/2018   Procedure: ENDOSCOPIC RETROGRADE CHOLANGIOPANCREATOGRAPHY (ERCP) WITH PROPOFOL;  Surgeon: Lucilla Lame, MD;  Location: [facility] ENDOSCOPY;  Service: Endoscopy;  Laterality: N/A;  . ERCP N/A 03/29/2018   Procedure: ENDOSCOPIC RETROGRADE CHOLANGIOPANCREATOGRAPHY (ERCP);  Surgeon: Ronnette Juniper, MD;  Location: [place];  Service: Gastroenterology;  Laterality: N/A;  . ERCP N/A 03/30/2018   Procedure: ENDOSCOPIC RETROGRADE CHOLANGIOPANCREATOGRAPHY (ERCP);  Surgeon: Jose Essex, MD;  Location: [place];  Service: Endoscopy;  Laterality: N/A;  . IR PERC CHOLECYSTOSTOMY  12/28/2017  . LAPAROSCOPIC  CHOLECYSTECTOMY  01/26/2018  . MELANOMA EXCISION  12/2016   in situ---Dr Dasher  . SPHINCTEROTOMY  03/30/2018   Procedure: SPHINCTEROTOMY;  Surgeon: Jose Essex, MD;  Location[facility] ENDOSCOPY;  Service: Endoscopy;;  . STENT REMOVAL  03/29/2018   Procedure: STENT REMOVAL;  Surgeon: Ronnette Juniper, MD;  Location: [facility] ENDOSCOPY;  Service: Gastroenterology;;  . TONSILLECTOMY      Family History  Problem Relation Age of Onset  . Cancer Son   . Coronary  artery disease Neg Hx   . Diabetes Neg Hx     Social History:  reports that he has never smoked. He has never used smokeless tobacco. He reports that he does not drink alcohol or use drugs.  Allergies: No Known Allergies  Medications; Prior to Admission medications   Medication Sig Start Date End Date Taking? Authorizing Provider  feeding supplement, ENSURE ENLIVE, (ENSURE ENLIVE) LIQD Take 6 mLs by mouth 2 (two) times daily between meals. 03/17/18  Yes Wieting, Richard, MD  ferrous sulfate 325 (65 FE) MG EC tablet Take 325 mg by mouth daily with breakfast.   Yes [provider]  gabapentin (NEURONTIN) 600 MG tablet TAKE 1 TABLET (600 MG TOTAL) BY MOUTH 2 (TWO) TIMES DAILY. Patient taking differently: Take 600 mg by mouth 2 (two) times daily.  07/21/17  Yes [name], MD  ketoconazole (NIZORAL) 2 % cream Apply 1 application topically 2 (two) times daily as needed for irritation. 05/07/17  Yes [name], MD  metoprolol succinate (TOPROL-XL) 25 MG 24 hr tablet Take 1 tablet (25 mg total) by mouth daily. 04/22/18  Yes [name], MD  Multiple Vitamin (MULTIVITAMIN WITH MINERALS) TABS tablet Take 1 tablet by mouth daily. 03/17/18  Yes Wieting, Richard, MD  sulfamethoxazole-trimethoprim (BACTRIM DS,SEPTRA DS) 800-160 MG tablet Take 1 tablet by mouth 2 (two) times daily for 5 days. 04/22/18 04/27/18 Yes Lavina Hamman, MD  tamsulosin (FLOMAX) 0.4 MG CAPS capsule TAKE 1 CAPSULE (0.4 MG TOTAL) BY MOUTH DAILY. Patient taking differently: Take 0.4 mg by mouth daily.  07/21/17  Yes Viviana Simpler I, MD  triamcinolone cream (KENALOG) 0.1 % Apply 1 application topically 2 (two) times daily as needed. Patient taking differently: Apply 1 application topically 2 (two) times daily as needed (irritation).  09/05/16  Yes [name], MD  vitamin B-12 (CYANOCOBALAMIN) 500 MCG tablet Take 500 mcg by mouth daily.   Yes [provider]    PRN Meds  Results for orders  placed or performed during the hospital encounter of 04/22/18 (from the past 48 hour(s))  Comprehensive metabolic panel     Status: Abnormal   Collection Time: 04/23/18 12:10 AM  Result Value Ref Range   Sodium 134 (L) 135 - 145 mmol/L   Potassium 3.7 3.5 - 5.1 mmol/L   Chloride 102 98 - 111 mmol/L   CO2 21 (L) 22 - 32 mmol/L   Glucose, Bld 224 (H) 70 - 99 mg/dL   BUN 7 (L) 8 - 23 mg/dL   Creatinine, Ser 0.86 0.61 - 1.24 mg/dL   Calcium 8.4 (L) 8.9 - 10.3 mg/dL   Total Protein 6.0 (L) 6.5 - 8.1 g/dL   Albumin 2.6 (L) 3.5 - 5.0 g/dL   AST 173 (H) 15 - 41 U/L   ALT 99 (H) 0 - 44 U/L   Alkaline Phosphatase 574 (H) 38 - 126 U/L   Total Bilirubin 3.3 (H) 0.3 - 1.2 mg/dL   GFR calc non Af Amer >60 >  60 mL/min   GFR calc Af Amer >60 >60 mL/min   Anion gap 11 5 - 15    Comment: Performed at [facility] [address], [place], [place] [address]  CBC with Differential     Status: Abnormal   Collection Time: 04/23/18 12:10 AM  Result Value Ref Range   WBC 6.0 4.0 - 10.5 K/uL   RBC 3.35 (L) 4.22 - 5.81 MIL/uL   Hemoglobin 10.6 (L) 13.0 - 17.0 g/dL   HCT 33.1 (L) 39.0 - 52.0 %   MCV 98.8 80.0 - 100.0 fL   MCH 31.6 26.0 - 34.0 pg   MCHC 32.0 30.0 - 36.0 g/dL   RDW 17.0 (H) 11.5 - 15.5 %   Platelets 118 (L) 150 - 400 K/uL    Comment: REPEATED TO VERIFY Immature Platelet Fraction may be clinically indicated, consider ordering this additional test [id number] CONSISTENT WITH PREVIOUS RESULT    nRBC 0.0 0.0 - 0.2 %   Neutrophils Relative % 79 %   Neutro Abs 4.8 1.7 - 7.7 K/uL   Lymphocytes Relative 7 %   Lymphs Abs 0.4 (L) 0.7 - 4.0 K/uL   Monocytes Relative 9 %   Monocytes Absolute 0.6 0.1 - 1.0 K/uL   Eosinophils Relative 3 %   Eosinophils Absolute 0.2 0.0 - 0.5 K/uL   Basophils Relative 1 %   Basophils Absolute 0.0 0.0 - 0.1 K/uL   Immature Granulocytes 1 %   Abs Immature Granulocytes 0.04 0.00 - 0.07 K/uL    Comment: Performed at [facility], [place] [address]  [place], [place] [address]  Lipase, blood     Status: None   Collection Time: 04/23/18 12:10 AM  Result Value Ref Range   Lipase 27 11 - 51 U/L    Comment: Performed at [facility] [address] [place], [place] [address]  I-Stat CG4 Lactic Acid, ED     Status: Abnormal   Collection Time: 04/23/18 12:32 AM  Result Value Ref Range   Lactic Acid, Venous 2.73 (HH) 0.5 - 1.9 mmol/L   Comment NOTIFIED PHYSICIAN   Urinalysis, Routine w reflex microscopic     Status: Abnormal   Collection Time: 04/23/18  2:39 AM  Result Value Ref Range   Color, Urine YELLOW YELLOW   APPearance CLEAR CLEAR   Specific Gravity, Urine 1.006 1.005 - 1.030   pH 7.0 5.0 - 8.0   Glucose, UA 150 (A) NEGATIVE mg/dL   Hgb urine dipstick NEGATIVE NEGATIVE   Bilirubin Urine NEGATIVE NEGATIVE   Ketones, ur NEGATIVE NEGATIVE mg/dL   Protein, ur NEGATIVE NEGATIVE mg/dL   Nitrite NEGATIVE NEGATIVE   Leukocytes, UA NEGATIVE NEGATIVE    Comment: Performed at [facility] [address] [place], [place] [address]  Lactic acid, plasma     Status: None   Collection Time: 04/23/18  4:57 AM  Result Value Ref Range   Lactic Acid, Venous 0.9 0.5 - 1.9 mmol/L    Comment: Performed at [facility] [address], [place],  [address]    Dg Chest 2 View  Result Date: 04/23/2018 CLINICAL DATA:  Fever EXAM: CHEST - 2 VIEW COMPARISON:  04/19/2018 FINDINGS: Heart is normal size. Bibasilar atelectasis or infiltrates. Small effusions. No acute bony abnormality. IMPRESSION: Small bilateral effusions with bibasilar atelectasis or infiltrates. Electronically Signed   By: Rolm Baptise M.D.   On: 04/23/2018 00:30  Blood pressure 123/73, pulse 79, temperature 98.7 F (37.1 C), temperature source Oral, resp. rate 18, SpO2 95 %.  Physical exam:   General--white male is elderly in no distress very hard of hearing ENT--mucous membranes very dry nonicteric Neck--supple Heart--somewhat  tachycardic no murmurs Lungs--poor breath sounds throughout Abdomen--nondistended with good bowel sounds and completely nontender  psych--unable to assess due to his poor hearing.   Assessment: 1.  Fever/increased LFTs.  Not clear if this is due to progressive sepsis from his pneumonia or dysfunction of his recently placed stent.  He is having no abdominal pain or nausea and the recent CT scan showed the stent to be in good position.  He does have a long 53 French stent and just a couple weeks ago the CBD was cleared at ERCP.  Feeling is that this is probably not cholangitis but from pneumonia. 2.  Recent sepsis from pneumonia 3.  Biliary obstruction with recent placement of stents.  He apparently had had pancreatitis in the past and the "feeling was that he may have had some stricturing from prior pancreatitis.  He has had sphincterotomy x2 with large covered metal stent placed.  Plan: We will follow with you.  Agree with repeat culture and treatment with broad-spectrum antibiotics.  We will follow LFTs.  If they continue to rise he may need to have his stent removed and replaced but will not plan on doing that unless absolutely necessary.   Nancy Fetter 04/23/2018, 10:59 AM   This note was created using voice recognition software and minor errors may Have occurred unintentionally. Pager: [phone] If no answer or after hours call [phone]

## 2018-04-23 NOTE — ED Notes (Signed)
DR. Tamala Julian paged in regards to patient needing a bed request

## 2018-04-23 NOTE — ED Notes (Signed)
Admitting physician at bedside

## 2018-04-23 NOTE — Evaluation (Signed)
Objective Swallowing Evaluation: Type of Study: Bedside Swallow Evaluation   Patient Details  Name: Jose [name] MRN: [id number] Date of Birth: February 09, 1924  Today's Date: 04/23/2018 Time: SLP Start Time (ACUTE ONLY): 1433 -SLP Stop Time (ACUTE ONLY): 1442  SLP Time Calculation (min) (ACUTE ONLY): 9 min   Past Medical History:  Past Medical History:  Diagnosis Date  . Anxiety   . Back problem 1960  . BPH (benign prostatic hypertrophy)   . Cervical radiculopathy    with neuropathy  . Dyspnea   . GERD (gastroesophageal reflux disease)   . Hypertension   . Hypokalemia   . Melanoma in situ ([place]) 12/2016   neck   Past Surgical History:  Past Surgical History:  Procedure Laterality Date  . BILIARY STENT PLACEMENT  03/30/2018   Procedure: BILIARY STENT PLACEMENT;  Surgeon: Clarene Essex, MD;  Location: [place];  Service: Endoscopy;;  . CATARACT EXTRACTION     OD  . CHOLECYSTECTOMY N/A 01/26/2018   Procedure: LAPAROSCOPIC CHOLECYSTECTOMY;  Surgeon: Coralie Keens, MD;  Location: [place];  Service: General;  Laterality: N/A;  . ENDOSCOPIC RETROGRADE CHOLANGIOPANCREATOGRAPHY (ERCP) WITH PROPOFOL N/A 03/13/2018   Procedure: ENDOSCOPIC RETROGRADE CHOLANGIOPANCREATOGRAPHY (ERCP) WITH PROPOFOL;  Surgeon: Lucilla Lame, MD;  Location: [facility] ENDOSCOPY;  Service: Endoscopy;  Laterality: N/A;  . ERCP N/A 03/29/2018   Procedure: ENDOSCOPIC RETROGRADE CHOLANGIOPANCREATOGRAPHY (ERCP);  Surgeon: Ronnette Juniper, MD;  Location: [place];  Service: Gastroenterology;  Laterality: N/A;  . ERCP N/A 03/30/2018   Procedure: ENDOSCOPIC RETROGRADE CHOLANGIOPANCREATOGRAPHY (ERCP);  Surgeon: Clarene Essex, MD;  Location: [place];  Service: Endoscopy;  Laterality: N/A;  . IR PERC CHOLECYSTOSTOMY  12/28/2017  . LAPAROSCOPIC CHOLECYSTECTOMY  01/26/2018  . MELANOMA EXCISION  12/2016   in situ---Dr Dasher  . SPHINCTEROTOMY  03/30/2018   Procedure: SPHINCTEROTOMY;  Surgeon: Clarene Essex, MD;  Location:  [facility] ENDOSCOPY;  Service: Endoscopy;;  . STENT REMOVAL  03/29/2018   Procedure: STENT REMOVAL;  Surgeon: Ronnette Juniper, MD;  Location: [facility] ENDOSCOPY;  Service: Gastroenterology;;  . TONSILLECTOMY     HPI: Pt admitted with Sepsis Unclear etiology.  Recently hospitalized for bilateral lobar pneumonia with sepsis.  Treated with empiric antibiotic and discharged on Bactrim.  Returned same evening with sepsis. Family denies any difficulty swallowing.    No data recorded   Assessment / Plan / Recommendation  CHL IP CLINICAL IMPRESSIONS 04/23/2018  Clinical Impression --  SLP Visit Diagnosis Dysphagia, unspecified (R13.10)  Attention and concentration deficit following --  Frontal lobe and executive function deficit following --  Impact on safety and function Mild aspiration risk      CHL IP TREATMENT RECOMMENDATION 04/23/2018  Treatment Recommendations No treatment recommended at this time     No flowsheet data found.  No flowsheet data found.    No flowsheet data found.    No flowsheet data found.    No flowsheet data found.         No flowsheet data found.  No flowsheet data found.   No flowsheet data found.   Jose [name], Jose [name] 04/23/2018, 2:43 PM

## 2018-04-23 NOTE — ED Notes (Signed)
Per family at bedside, pt was discharged at 1pm today after admission on Sunday for sepsis and pneumonia. Family reports pt had an episode today of being less responsive than baseline, heart rate noted to be 120 at home. Pt reports he feels fine and denies any complaints at present

## 2018-04-24 LAB — COMPREHENSIVE METABOLIC PANEL
ALT: 66 U/L — ABNORMAL HIGH (ref 0–44)
AST: 55 U/L — ABNORMAL HIGH (ref 15–41)
Albumin: 2.4 g/dL — ABNORMAL LOW (ref 3.5–5.0)
Alkaline Phosphatase: 449 U/L — ABNORMAL HIGH (ref 38–126)
Anion gap: 8 (ref 5–15)
BUN: 8 mg/dL (ref 8–23)
CHLORIDE: 109 mmol/L (ref 98–111)
CO2: 22 mmol/L (ref 22–32)
Calcium: 8.2 mg/dL — ABNORMAL LOW (ref 8.9–10.3)
Creatinine, Ser: 0.87 mg/dL (ref 0.61–1.24)
GFR calc Af Amer: >60 mL/min (ref >60)
GFR calc non Af Amer: >60 mL/min (ref >60)
Glucose, Bld: 108 mg/dL — ABNORMAL HIGH (ref 70–99)
Potassium: 3.3 mmol/L — ABNORMAL LOW (ref 3.5–5.1)
Sodium: 139 mmol/L (ref 135–145)
Total Bilirubin: 2 mg/dL — ABNORMAL HIGH (ref 0.3–1.2)
Total Protein: 5.5 g/dL — ABNORMAL LOW (ref 6.5–8.1)

## 2018-04-24 LAB — URINE CULTURE: CULTURE: NO GROWTH

## 2018-04-24 LAB — CBC
HCT: 31.4 % — ABNORMAL LOW (ref 39.0–52.0)
Hemoglobin: 9.9 g/dL — ABNORMAL LOW (ref 13.0–17.0)
MCH: 31.7 pg (ref 26.0–34.0)
MCHC: 31.5 g/dL (ref 30.0–36.0)
MCV: 100.6 fL — ABNORMAL HIGH (ref 80.0–100.0)
Platelets: 107 10*3/uL — ABNORMAL LOW (ref 150–400)
RBC: 3.12 MIL/uL — AB (ref 4.22–5.81)
RDW: 16.8 % — ABNORMAL HIGH (ref 11.5–15.5)
WBC: 6.3 10*3/uL (ref 4.0–10.5)
nRBC: 0 % (ref 0.0–0.2)

## 2018-04-24 MED ORDER — ENSURE ENLIVE PO LIQD
237.0000 mL | Freq: Two times a day (BID) | ORAL | Status: DC
  Administered 2018-04-24 – 2018-04-26 (×2): 237 mL via ORAL

## 2018-04-24 MED ORDER — TRAZODONE HCL 50 MG PO TABS
50.0000 mg | ORAL_TABLET | Freq: Every day | ORAL | Status: DC
  Administered 2018-04-24 – 2018-04-25 (×2): 50 mg via ORAL
  Filled 2018-04-24 (×2): qty 1

## 2018-04-24 MED ORDER — POTASSIUM CHLORIDE CRYS ER 20 MEQ PO TBCR
40.0000 meq | EXTENDED_RELEASE_TABLET | Freq: Once | ORAL | Status: AC
  Administered 2018-04-24: 40 meq via ORAL
  Filled 2018-04-24: qty 2

## 2018-04-24 MED ORDER — PRAMIPEXOLE DIHYDROCHLORIDE 0.125 MG PO TABS
0.1250 mg | ORAL_TABLET | Freq: Every day | ORAL | Status: DC | PRN
  Administered 2018-04-24 (×2): 0.125 mg via ORAL
  Filled 2018-04-24 (×3): qty 1

## 2018-04-24 NOTE — Progress Notes (Signed)
PROGRESS NOTE    Jose [name]  QJF:[id number] DOB: 1923/07/01 DOA: 04/22/2018 PCP: [name], MD     Brief Narrative:  Jose [name] is a 84 year old male with past medical history of hypertension, history of acute cholecystitis status post laparoscopic cholecystectomy, ascending colitis and bacteremia with distal CBD stricture, status post sphincterotomy, plastic stent placement.  Patient was hospitalized 4 weeks ago, underwent ERCP and found CBD stent in the duodenum, distally displaced.  Stent was removed, replaced with bare-metal stent.  Patient was admitted earlier this week with pneumonia.  He was discharged home with 7-day tx Bactrim.  After patient was discharged home, patient was found to have a fever of 103, chills, weak.  He was found to have slurred speech, which family notes that is common when he has sepsis-like symptoms.  Patient was readmitted for treatment.  Due to elevation in liver enzymes, bilirubin, GI was consulted.  New events last 24 hours / Subjective: Complains of being cold, denies any abdominal pain.  He denies any shortness of breath.  He has been afebrile overnight. Per daughter-in-law, after my evaluation, he has been very agitated and restless   Assessment & Plan:   Principal Problem:   Sepsis ([place]) Active Problems:   Essential hypertension, benign   Elevated liver enzymes   Malnutrition of moderate degree   BPH (benign prostatic hyperplasia)   Sepsis secondary to bilateral lobar pneumonia Chest x-ray was reviewed independently by myself, reveals bibasilar effusion/consolidation Influenza PCR, respiratory panel was negative previous admission 2 days ago and was not repeated upon this admission Blood cultures negative to date Empiric cefepime/flagyl  IVF   Elevated liver enzymes History of cholangitis, CBD stricture status post stent placement, replacement Patient denies any abdominal pain LFTs are improving Appreciate GI  consultation, currently does not feel sepsis is secondary to cholangitis  Essential hypertension Continue Toprol   BPH Continue Flomax  Hypokalemia Replace, trend  Chronic normocytic anemia Continue iron     DVT prophylaxis: Lovenox Code Status: DNR Family Communication: Spoke with son and daughter-in-law over the phone Disposition Plan: Pending further improvement in sepsis pathology.  Patient is a readmission for pneumonia, will be cautious in discharge planning   Consultants:   GI  Procedures:   None  Antimicrobials:  Anti-infectives (From admission, onward)   Start     Dose/Rate Route Frequency Ordered Stop   04/23/18 2200  ceFEPIme (MAXIPIME) 2 g in sodium chloride 0.9 % 100 mL IVPB     2 g 200 mL/hr over 30 Minutes Intravenous Every 24 hours 04/23/18 0957     04/23/18 0100  ceFEPIme (MAXIPIME) 2 g in sodium chloride 0.9 % 100 mL IVPB     2 g 200 mL/hr over 30 Minutes Intravenous  Once 04/23/18 0045 04/23/18 0236   04/23/18 0100  metroNIDAZOLE (FLAGYL) IVPB 500 mg     500 mg 100 mL/hr over 60 Minutes Intravenous Every 8 hours 04/23/18 0045     04/23/18 0100  vancomycin (VANCOCIN) IVPB 1000 mg/200 mL premix  Status:  Discontinued     1,000 mg 200 mL/hr over 60 Minutes Intravenous  Once 04/23/18 0045 04/23/18 0053   04/23/18 0100  vancomycin (VANCOCIN) 1,250 mg in sodium chloride 0.9 % 250 mL IVPB     1,250 mg 166.7 mL/hr over 90 Minutes Intravenous  Once 04/23/18 0053 04/23/18 0506       Objective: Vitals:   04/23/18 1730 04/23/18 2034 04/24/18 0455 04/24/18 1033  BP: (!) 149/94 128/66  138/70   Pulse:  85 70   Resp:  19 19   Temp:  98.2 F (36.8 C) 98.1 F (36.7 C)   TempSrc:  Oral Oral   SpO2: 96% 95% 95%   Height:    5\' 10"  (1.778 m)    Intake/Output Summary (Last 24 hours) at 04/24/2018 1351 Last data filed at 04/24/2018 1129 Gross per 24 hour  Intake 3543.04 ml  Output -  Net 3543.04 ml   There were no vitals filed for this  visit.  Examination:  General exam: Appears calm and comfortable, hard of hearing  Respiratory system: Diminished breath sounds bibasilar, on room air Cardiovascular system: S1 & S2 heard, RRR. No JVD, murmurs, rubs, gallops or clicks. No pedal edema. Gastrointestinal system: Abdomen is nondistended, soft and nontender. No organomegaly or masses felt. Normal bowel sounds heard. Central nervous system: Alert without any new focal neurological deficits Extremities: Symmetric 5 x 5 power. Skin: No rashes, lesions or ulcers  Data Reviewed: I have personally reviewed following labs and imaging studies  CBC: Recent Labs  Lab 04/19/18 1900 04/20/18 0240 04/21/18 0219 04/21/18 2015 04/22/18 0217 04/23/18 0010 04/24/18 0351  WBC 11.4* 10.8* 7.6  --  5.9 6.0 6.3  NEUTROABS 10.5* 9.1*  --   --   --  4.8  --   HGB 10.1* 7.8* 6.9* 9.4* 8.7* 10.6* 9.9*  HCT 32.4* 24.7* 22.8* 28.4* 27.0* 33.1* 31.4*  MCV 103.8* 102.5* 104.1*  --  99.6 98.8 100.6*  PLT 165 106* 97*  --  104* 118* 294*   Basic Metabolic Panel: Recent Labs  Lab 04/20/18 0128 04/21/18 0219 04/22/18 0217 04/23/18 0010 04/24/18 0351  NA 133* 137 138 134* 139  K 3.4* 3.4* 3.9 3.7 3.3*  CL 107 110 108 102 109  CO2 19* 22 21* 21* 22  GLUCOSE 132* 101* 93 224* 108*  BUN 15 11 7* 7* 8  CREATININE 0.85 0.75 0.69 0.86 0.87  CALCIUM 7.0* 7.6* 8.2* 8.4* 8.2*  MG 1.4* 1.9  --   --   --    GFR: Estimated Creatinine Clearance: 49.3 mL/min (by C-G formula based on SCr of 0.87 mg/dL). Liver Function Tests: Recent Labs  Lab 04/19/18 1900 04/20/18 0240 04/21/18 0219 04/23/18 0010 04/24/18 0351  AST 39 31 22 173* 55*  ALT 38 29 26 99* 66*  ALKPHOS 266* 176* 161* 574* 449*  BILITOT 1.9* 1.3* 0.9 3.3* 2.0*  PROT 6.9 5.2* 4.7* 6.0* 5.5*  ALBUMIN 3.1* 2.2* 2.0* 2.6* 2.4*   Recent Labs  Lab 04/19/18 1900 04/23/18 0010  LIPASE 36 27   No results for input(s): AMMONIA in the last 168 hours. Coagulation Profile: Recent  Labs  Lab 04/19/18 1900  INR 1.31   Cardiac Enzymes: No results for input(s): CKTOTAL, CKMB, CKMBINDEX, TROPONINI in the last 168 hours. BNP (last 3 results) No results for input(s): PROBNP in the last 8760 hours. HbA1C: No results for input(s): HGBA1C in the last 72 hours. CBG: No results for input(s): GLUCAP in the last 168 hours. Lipid Profile: No results for input(s): CHOL, HDL, LDLCALC, TRIG, CHOLHDL, LDLDIRECT in the last 72 hours. Thyroid Function Tests: No results for input(s): TSH, T4TOTAL, FREET4, T3FREE, THYROIDAB in the last 72 hours. Anemia Panel: No results for input(s): VITAMINB12, FOLATE, FERRITIN, TIBC, IRON, RETICCTPCT in the last 72 hours. Sepsis Labs: Recent Labs  Lab 04/19/18 2249 04/20/18 0128 04/23/18 0032 04/23/18 0457  PROCALCITON  --  4.16  --   --  LATICACIDVEN 3.48* 2.3* 2.73* 0.9    Recent Results (from the past 240 hour(s))  Culture, blood (routine x 2)     Status: None (Preliminary result)   Collection Time: 04/19/18  7:00 PM  Result Value Ref Range Status   Specimen Description BLOOD SITE NOT SPECIFIED  Final   Special Requests   Final    BOTTLES DRAWN AEROBIC AND ANAEROBIC Blood Culture adequate volume   Culture   Final    NO GROWTH 4 DAYS Performed at [facility], [place] [address], [place], [place] [address]    Report Status PENDING  Incomplete  Culture, blood (routine x 2)     Status: None (Preliminary result)   Collection Time: 04/19/18  8:10 PM  Result Value Ref Range Status   Specimen Description BLOOD LEFT ARM  Final   Special Requests   Final    BOTTLES DRAWN AEROBIC AND ANAEROBIC Blood Culture results may not be optimal due to an inadequate volume of blood received in culture bottles   Culture   Final    NO GROWTH 4 DAYS Performed at [facility], [place] [address] [place], [place] [address]    Report Status PENDING  Incomplete  Urine Culture     Status: None   Collection Time: 04/19/18  8:25 PM  Result Value  Ref Range Status   Specimen Description URINE, CATHETERIZED  Final   Special Requests NONE  Final   Culture   Final    NO GROWTH Performed at [facility], [place] [address], [place], [place] [address]    Report Status 04/21/2018 FINAL  Final  Culture, sputum-assessment     Status: None   Collection Time: 04/20/18 11:24 AM  Result Value Ref Range Status   Specimen Description SPUTUM  Final   Special Requests NONE  Final   Sputum evaluation   Final    THIS SPECIMEN IS ACCEPTABLE FOR SPUTUM CULTURE Performed at [facility], [place] [address], [place], [place] [address]    Report Status 04/20/2018 FINAL  Final  Culture, respiratory     Status: None   Collection Time: 04/20/18 11:24 AM  Result Value Ref Range Status   Specimen Description SPUTUM  Final   Special Requests NONE Reflexed from [id number]  Final   Gram Stain   Final    RARE WBC PRESENT, PREDOMINANTLY PMN FEW GRAM POSITIVE COCCI IN PAIRS IN CLUSTERS FEW SQUAMOUS EPITHELIAL CELLS PRESENT    Culture   Final    MODERATE Consistent with normal respiratory flora. Performed at [facility], [place] [address], [place], [place] [address]    Report Status 04/22/2018 FINAL  Final  Blood Culture (routine x 2)     Status: None (Preliminary result)   Collection Time: 04/23/18 12:43 AM  Result Value Ref Range Status   Specimen Description BLOOD RIGHT ANTECUBITAL  Final   Special Requests   Final    BOTTLES DRAWN AEROBIC AND ANAEROBIC Blood Culture adequate volume   Culture   Final    NO GROWTH 1 DAY Performed at [facility], [place] [address], [place], [place] [address]    Report Status PENDING  Incomplete  Blood Culture (routine x 2)     Status: None (Preliminary result)   Collection Time: 04/23/18 12:48 AM  Result Value Ref Range Status   Specimen Description BLOOD RIGHT FOREARM  Final   Special Requests   Final    BOTTLES DRAWN AEROBIC  AND ANAEROBIC Blood Culture results may not be optimal due to an inadequate  volume of blood received in culture bottles   Culture   Final    NO GROWTH 1 DAY Performed at [place] [address], [place], [place] [address]    Report Status PENDING  Incomplete  Urine culture     Status: None   Collection Time: 04/23/18  2:39 AM  Result Value Ref Range Status   Specimen Description URINE, RANDOM  Final   Special Requests NONE  Final   Culture   Final    NO GROWTH Performed at [facility], [address], [place], [place] [address]    Report Status 04/24/2018 FINAL  Final       Radiology Studies: Dg Chest 2 View  Result Date: 04/23/2018 CLINICAL DATA:  Fever EXAM: CHEST - 2 VIEW COMPARISON:  04/19/2018 FINDINGS: Heart is normal size. Bibasilar atelectasis or infiltrates. Small effusions. No acute bony abnormality. IMPRESSION: Small bilateral effusions with bibasilar atelectasis or infiltrates. Electronically Signed   By: Rolm Baptise M.D.   On: 04/23/2018 00:30      Scheduled Meds: . enoxaparin (LOVENOX) injection  40 mg Subcutaneous Q24H  . feeding supplement (ENSURE ENLIVE)  237 mL Oral BID BM  . ferrous sulfate  325 mg Oral Q breakfast  . metoprolol succinate  25 mg Oral Daily  . multivitamin with minerals  1 tablet Oral Daily  . tamsulosin  0.4 mg Oral Daily  . traZODone  50 mg Oral QHS  . vitamin B-12  500 mcg Oral Daily   Continuous Infusions: . sodium chloride 75 mL/hr at 04/23/18 0654  . sodium chloride 75 mL/hr at 04/24/18 0118  . ceFEPime (MAXIPIME) IV 2 g (04/23/18 2214)  . metronidazole 500 mg (04/24/18 0958)     LOS: 1 day    Time spent: 50 minutes   Dessa Phi, DO [facility] [email or web address] Password [email or web address] 04/24/2018, 1:51 PM

## 2018-04-24 NOTE — Plan of Care (Signed)

## 2018-04-24 NOTE — Progress Notes (Signed)
[facility]  Patient Status: Active (receiving services up to time of hospitalization)  [facility] is providing the following services: RN, PT and OT  If patient discharges after hours, please call [phone].   Jose [name] 04/24/2018, 10:27 AM

## 2018-04-24 NOTE — Progress Notes (Signed)
Initial Nutrition Assessment  DOCUMENTATION CODES:   Not applicable  INTERVENTION:   - Ordered new weight - Continue Ensure Enlive BID (each provides 350 kcal and 20 g protein) - Continue MVI with minerals and vitamin B-12  NUTRITION DIAGNOSIS:   Inadequate oral intake related to acute illness as evidenced by estimated needs.  GOAL:   Patient will meet greater than or equal to 90% of their needs  MONITOR:   PO intake, Supplement acceptance, Weight trends, Labs  REASON FOR ASSESSMENT:   Malnutrition Screening Tool    ASSESSMENT:   84 yo male, admitted with sepsis. PMH significant for hearing loss, HTN, GERD, hypokalemia, cholecystectomy 01/2018.  Labs: potassium 3.3, glucose 108, ALP 449 (H), AST 55 (H), ALT 66 (H), tProtein 5.5, tBili 2.0 (H), Hgb 9.9, Hct 31.4%, MCV 100.6 Meds: Ensure Enlive BID, MVI with minerals daily, vitamin B-12 500 mcg daily, NaCl infusion 75 mL/hr  Pt resting in bed, family present at time of visit. Pt hard of hearing, family provided some answers.  Per family, pt had very little to eat yesterday, did not receive dinner - had to be brought in by family. Expressed concern that pt was not receiving adequate care and attention from hospital staff.   Pt had breakfast this morning, did not like the pancakes, but ate well per nsg documentation. Per family, pt typically eats 2 meals daily with a light lunch. Does not follow any special diet, and takes iron and vit B-12 at home.  Per family, pt weighed 130-135# (59.1-61.3 kg) in Aug 2019, and most recently weighed 124# (56.4 kg). Per chart, pt weighed 64.4 kg on 12/4 - this was carried over from another encounter. Unsure if this is a stated wt and ?accuracy. Ordered new wt.  No nausea or vomiting, constipation or diarrhea, or trouble chewing or swallowing reported.   Encouraged pt and family to include protein-rich foods with meals and snacks. Will continue Ensure Enlive BID in any flavor but  chocolate.  Suspect moderate PCM, but unable to dx at this time.  NUTRITION - FOCUSED PHYSICAL EXAM:    Most Recent Value  Orbital Region  Moderate depletion  Upper Arm Region  Moderate depletion  Thoracic and Lumbar Region  Unable to assess  Buccal Region  Mild depletion  Temple Region  Mild depletion  Clavicle Bone Region  Mild depletion  Clavicle and Acromion Bone Region  Mild depletion  Scapular Bone Region  Unable to assess  Dorsal Hand  Mild depletion  Patellar Region  Unable to assess  Anterior Thigh Region  Unable to assess  Posterior Calf Region  Unable to assess  Edema (RD Assessment)  Unable to assess  Hair  Reviewed  Eyes  Reviewed  Mouth  Reviewed  Skin  Reviewed  Nails  Other (Comment) pale      Diet Order:  100% breakfast today, per nsg documentation Diet Order            Diet regular Room service appropriate? Yes; Fluid consistency: Thin  Diet effective now              EDUCATION NEEDS:  No education needs have been identified at this time  Skin:  Skin Assessment: Skin Integrity Issues: Skin Integrity Issues:: Other (Comment) Other: abrasion (R leg); ecchymosis (arms, legs)  Last BM:  12/3, type 6  Height:  Ht Readings from Last 1 Encounters:  04/24/18 5\' 10"  (1.778 m)    Weight:  Wt Readings from Last 1 Encounters:  04/22/18 64.4 kg    Ideal Body Weight:  78.2 kg  BMI:  Body mass index is 20.37 kg/m.  Estimated Nutritional Needs:   Kcal:  1568 calories daily (MSJ x 1.2)  Protein:  78-94 gm daily (1.0-1.2 g/kg IBW)  Fluid:  >/= 1.5 L daily or per MD discretion  Althea Grimmer, MS, RDN, LDN Pager: [phone]

## 2018-04-24 NOTE — Care Management Note (Addendum)
Case Management Note  Patient Details  Name: Keona Sheffler MRN: [id number] Date of Birth: 01-Jan-1924  Subjective/Objective:   Sepsis/ PNA. Hx of hypertension, acute cholecystitis status post laparoscopic cholecystectomy, ascending colitis and bacteremia with distal CBD stricture, status post sphincterotomy, plastic stent placement. Multi admits. From home alone.PTA active with [facility],RN,PT,OT. Family provides 24/7 supervision /assistance for pt while home. Owns cane and walker.  Teryl Lucy (Relative) Mannix Kroeker (Son559 [phone]      Action/Plan: Pending PT/OT evaluations .... NCM following for [facility] needs  Expected Discharge Date:                  Expected Discharge Plan:  [place]  In-House Referral:  Clinical Social Work  Discharge planning Services  CM Consult  Post Acute Care Choice:  Resumption of Svcs/PTA Provider, Home Health([facility]/ RN,PT,OT) Choice offered to:  Adult Children  DME Arranged:  N/A DME Agency:  NA  HH Arranged:    [place] Agency:     Status of Service:  In process, will continue to follow  If discussed at Long Length of Stay Meetings, dates discussed:    Additional Comments:  Sharin Mons, RN 04/24/2018, 1:56 PM

## 2018-04-24 NOTE — Evaluation (Signed)
Physical Therapy Evaluation Patient Details Name: Jose [name] MRN: [id number] DOB: 03/07/1924 Today's Date: 04/24/2018   History of Present Illness  84 y.o. male admitted for fever, chills and weakness, recent admission for sepsis due to bilateral lower lobe PNA, HTN, anemia and question pancreatitis.  Pt with significant PMH of HTN, dyspnea, cervical radiculopathy, recent ECRP (03/30/18) and biliary stent placement.    Clinical Impression  Patient presents with decreased mobility due to multiple recent hospitalizations and procedures since September.  Currently remains very mobile and able to ambulate with walker and assist for safety.  Feel once medical issues addressed he is stable for d/c home with family/caregiver support and follow up HHPT.     Follow Up Recommendations Home health PT;Supervision/Assistance - 24 hour    Equipment Recommendations  None recommended by PT    Recommendations for Other Services       Precautions / Restrictions Precautions Precautions: Fall      Mobility  Bed Mobility Overal bed mobility: Needs Assistance Bed Mobility: Supine to Sit     Supine to sit: Supervision     General bed mobility comments: supervision for safety  Transfers Overall transfer level: Needs assistance Equipment used: Rolling walker (2 wheeled) Transfers: Sit to/from Stand Sit to Stand: Min guard;Mod assist         General transfer comment: assist for balance/safety, lifting help up from low toilet in bathroom  Ambulation/Gait Ambulation/Gait assistance: Min guard Gait Distance (Feet): 450 Feet Assistive device: Rolling walker (2 wheeled) Gait Pattern/deviations: Step-through pattern;Decreased stride length;Trunk flexed     General Gait Details: three times around unit assist for safety, slows appropriately to maneuver in tight spaces or around obstacles, appropriate use of side stepping in and out of bathroom with walker  Stairs             Wheelchair Mobility    Modified Rankin (Stroke Patients Only)       Balance Overall balance assessment: Needs assistance Sitting-balance support: Feet supported;No upper extremity supported Sitting balance-Leahy Scale: Good       Standing balance-Leahy Scale: Fair Standing balance comment: standing at sink to wash hands no UE support                             Pertinent Vitals/Pain Pain Assessment: Faces Faces Pain Scale: Hurts even more Pain Location: both legs  Pain Descriptors / Indicators: Aching;Discomfort;Restless Pain Intervention(s): Monitored during session;Repositioned;Patient requesting pain meds-RN notified    Home Living Family/patient expects to be discharged to:: Private residence Living Arrangements: Alone Available Help at Discharge: Family;Available 24 hours/day Type of Home: House Home Access: Ramped entrance     Home Layout: One level Home Equipment: Walker - 2 wheels Additional Comments: have 24/7 care in the home    Prior Function Level of Independence: Needs assistance   Gait / Transfers Assistance Needed: walks with RW vs cane  ADL's / Homemaking Assistance Needed: Pt mod I with ADLs- family completed IADLs  Comments: Ambulates with SPC at baseline, occasionally drives but most often needs assistance/supervision with IADLs.  Recently using walker     Hand Dominance        Extremity/Trunk Assessment   Upper Extremity Assessment Upper Extremity Assessment: Generalized weakness    Lower Extremity Assessment Lower Extremity Assessment: Generalized weakness    Cervical / Trunk Assessment Cervical / Trunk Assessment: Kyphotic  Communication   Communication: HOH  Cognition Arousal/Alertness: Awake/alert Behavior During Therapy:  WFL for tasks assessed/performed Overall Cognitive Status: History of cognitive impairments - at baseline                                        General Comments General  comments (skin integrity, edema, etc.): daughter in room and eager for pt to walk frequently; discussed okay to walk with staff or family assist if can manage IV pole    Exercises     Assessment/Plan    PT Assessment Patient needs continued PT services  PT Problem List Decreased strength;Decreased balance;Decreased mobility;Decreased knowledge of use of DME;Decreased activity tolerance       PT Treatment Interventions DME instruction;Therapeutic activities;Gait training;Therapeutic exercise;Patient/family education;Balance training;Functional mobility training    PT Goals (Current goals can be found in the Care Plan section)  Acute Rehab PT Goals Patient Stated Goal: to get home and stay home PT Goal Formulation: With patient/family Time For Goal Achievement: 05/08/18 Potential to Achieve Goals: Good    Frequency Min 3X/week   Barriers to discharge        Co-evaluation               AM-PAC PT "6 Clicks" Mobility  Outcome Measure Help needed turning from your back to your side while in a flat bed without using bedrails?: A Little Help needed moving from lying on your back to sitting on the side of a flat bed without using bedrails?: A Little Help needed moving to and from a bed to a chair (including a wheelchair)?: A Little Help needed standing up from a chair using your arms (e.g., wheelchair or bedside chair)?: A Little Help needed to walk in hospital room?: A Little Help needed climbing 3-5 steps with a railing? : A Little 6 Click Score: 18    End of Session Equipment Utilized During Treatment: Gait belt Activity Tolerance: Patient tolerated treatment well Patient left: with call bell/phone within reach;in chair;with family/visitor present   PT Visit Diagnosis: Muscle weakness (generalized) (M62.81);Other abnormalities of gait and mobility (R26.89)    Time: 2979-8921 PT Time Calculation (min) (ACUTE ONLY): 33 min   Charges:   PT Evaluation $PT Eval Low  Complexity: 1 Low PT Treatments $Gait Training: 8-22 mins        Magda Kiel, [place] [facility] [phone] 04/24/2018   Reginia Naas 04/24/2018, 4:42 PM

## 2018-04-25 LAB — BASIC METABOLIC PANEL
Anion gap: 9 (ref 5–15)
BUN: 6 mg/dL — ABNORMAL LOW (ref 8–23)
CO2: 20 mmol/L — ABNORMAL LOW (ref 22–32)
Calcium: 8.1 mg/dL — ABNORMAL LOW (ref 8.9–10.3)
Chloride: 110 mmol/L (ref 98–111)
Creatinine, Ser: 0.72 mg/dL (ref 0.61–1.24)
GFR calc Af Amer: >60 mL/min (ref >60)
GFR calc non Af Amer: >60 mL/min (ref >60)
Glucose, Bld: 98 mg/dL (ref 70–99)
POTASSIUM: 3.4 mmol/L — AB (ref 3.5–5.1)
Sodium: 139 mmol/L (ref 135–145)

## 2018-04-25 LAB — CBC
HCT: 28.6 % — ABNORMAL LOW (ref 39.0–52.0)
Hemoglobin: 9.2 g/dL — ABNORMAL LOW (ref 13.0–17.0)
MCH: 31.8 pg (ref 26.0–34.0)
MCHC: 32.2 g/dL (ref 30.0–36.0)
MCV: 99 fL (ref 80.0–100.0)
Platelets: 104 10*3/uL — ABNORMAL LOW (ref 150–400)
RBC: 2.89 MIL/uL — AB (ref 4.22–5.81)
RDW: 16.7 % — ABNORMAL HIGH (ref 11.5–15.5)
WBC: 5.2 10*3/uL (ref 4.0–10.5)
nRBC: 0 % (ref 0.0–0.2)

## 2018-04-25 LAB — HEPATIC FUNCTION PANEL
ALBUMIN: 2.3 g/dL — AB (ref 3.5–5.0)
ALT: 44 U/L (ref 0–44)
AST: 32 U/L (ref 15–41)
Alkaline Phosphatase: 347 U/L — ABNORMAL HIGH (ref 38–126)
Bilirubin, Direct: 0.7 mg/dL — ABNORMAL HIGH (ref 0.0–0.2)
Indirect Bilirubin: 1 mg/dL — ABNORMAL HIGH (ref 0.3–0.9)
TOTAL PROTEIN: 5.2 g/dL — AB (ref 6.5–8.1)
Total Bilirubin: 1.7 mg/dL — ABNORMAL HIGH (ref 0.3–1.2)

## 2018-04-25 LAB — CULTURE, BLOOD (ROUTINE X 2)
Culture: NO GROWTH
Culture: NO GROWTH
Special Requests: ADEQUATE

## 2018-04-25 LAB — MAGNESIUM: Magnesium: 1.7 mg/dL (ref 1.7–2.4)

## 2018-04-25 MED ORDER — POTASSIUM CHLORIDE CRYS ER 20 MEQ PO TBCR
40.0000 meq | EXTENDED_RELEASE_TABLET | Freq: Once | ORAL | Status: AC
  Administered 2018-04-25: 40 meq via ORAL
  Filled 2018-04-25: qty 2

## 2018-04-25 NOTE — Plan of Care (Signed)

## 2018-04-25 NOTE — Evaluation (Signed)
Occupational Therapy Evaluation Patient Details Name: Jose [name] MRN: [id number] DOB: Mar 04, 1924 Today's Date: 04/25/2018    History of Present Illness 84 y.o. male admitted for fever, chills and weakness, recent admission for sepsis due to bilateral lower lobe PNA, HTN, anemia and question pancreatitis.  Pt with significant PMH of HTN, dyspnea, cervical radiculopathy, recent ECRP (03/30/18) and biliary stent placement.     Clinical Impression   PTA, pt was living alone but had 24/7 support by family and was performing BADLs and used cane for functional mobility; family performed IADLs and encouraged use of RW. Pt presenting with decreased strength, balance, and activity tolerance. Pt would benefit from further acute OT to facilitate safe dc. Recommend dc to home with HHOT for further OT to optimize safety, independence with ADLs, and return to PLOF.      Follow Up Recommendations  Home health OT;Supervision/Assistance - 24 hour    Equipment Recommendations  None recommended by OT    Recommendations for Other Services PT consult     Precautions / Restrictions Precautions Precautions: Fall Precaution Comments: mildly unsteady on his feet Restrictions Weight Bearing Restrictions: No      Mobility Bed Mobility Overal bed mobility: Needs Assistance Bed Mobility: Supine to Sit     Supine to sit: Supervision     General bed mobility comments: supervision for safety  Transfers Overall transfer level: Needs assistance Equipment used: Rolling walker (2 wheeled) Transfers: Sit to/from Stand Sit to Stand: Min guard         General transfer comment: Min Guard A for safety    Balance Overall balance assessment: Needs assistance Sitting-balance support: Feet supported;No upper extremity supported Sitting balance-Leahy Scale: Good     Standing balance support: Bilateral upper extremity supported;No upper extremity supported Standing balance-Leahy Scale:  Fair Standing balance comment: standing at sink to wash hands no UE support                           ADL either performed or assessed with clinical judgement   ADL Overall ADL's : Needs assistance/impaired Eating/Feeding: Independent;Sitting   Grooming: Wash/dry hands;Min guard;Standing   Upper Body Bathing: Set up;Supervision/ safety;Sitting   Lower Body Bathing: Min guard;Sit to/from stand   Upper Body Dressing : Set up;Supervision/safety;Sitting   Lower Body Dressing: Min guard;Sit to/from stand Lower Body Dressing Details (indicate cue type and reason): Pt able to don/doff socks without assistance both at EOB and in bed Toilet Transfer: Min guard;Ambulation;RW   Toileting- Water quality scientist and Hygiene: Min guard;Sit to/from stand       Functional mobility during ADLs: Min guard;Cueing for safety General ADL Comments: Pt presenting with decreased balance and strength     Vision Patient Visual Report: No change from baseline       Perception     Praxis      Pertinent Vitals/Pain Pain Assessment: Faces Faces Pain Scale: Hurts even more Pain Location: both legs  Pain Descriptors / Indicators: Aching;Discomfort;Restless Pain Intervention(s): Monitored during session;Limited activity within patient's tolerance;Repositioned     Hand Dominance Right   Extremity/Trunk Assessment Upper Extremity Assessment Upper Extremity Assessment: Generalized weakness   Lower Extremity Assessment Lower Extremity Assessment: Defer to PT evaluation   Cervical / Trunk Assessment Cervical / Trunk Assessment: Kyphotic   Communication Communication Communication: HOH   Cognition Arousal/Alertness: Awake/alert Behavior During Therapy: WFL for tasks assessed/performed Overall Cognitive Status: Within Functional Limits for tasks assessed  General Comments: Daughter-in-law reporting that pt is at cognitive baseline. Also  reports he appears to be feeling better because he is "spunkier"   General Comments  daughter in law present throughout session. Reporting pt will have increased support at dc.     Exercises     Shoulder Instructions      Home Living Family/patient expects to be discharged to:: Private residence Living Arrangements: Alone Available Help at Discharge: Family;Available 24 hours/day Type of Home: House Home Access: Ramped entrance     Home Layout: One level     Bathroom Shower/Tub: Occupational psychologist: Standard Bathroom Accessibility: Yes   Home Equipment: Environmental consultant - 2 wheels   Additional Comments: have 24/7 care in the home      Prior Functioning/Environment Level of Independence: Needs assistance  Gait / Transfers Assistance Needed: walks with RW vs cane ADL's / Homemaking Assistance Needed: Pt mod I with ADLs- family completed IADLs Communication / Swallowing Assistance Needed: HOH Comments: Ambulates with SPC at baseline, occasionally drives but most often needs assistance/supervision with IADLs.  Recently using walker        OT Problem List: Impaired balance (sitting and/or standing);Decreased cognition;Decreased safety awareness;Decreased knowledge of precautions;Decreased knowledge of use of DME or AE;Decreased strength;Cardiopulmonary status limiting activity      OT Treatment/Interventions: Self-care/ADL training;Therapeutic exercise;Energy conservation;DME and/or AE instruction;Therapeutic activities;Patient/family education;Balance training    OT Goals(Current goals can be found in the care plan section) Acute Rehab OT Goals Patient Stated Goal: to get home and stay home OT Goal Formulation: With patient/family Time For Goal Achievement: 04/28/18 Potential to Achieve Goals: Good  OT Frequency: Min 2X/week   Barriers to D/C:            Co-evaluation              AM-PAC OT "6 Clicks" Daily Activity     Outcome Measure Help from  another person eating meals?: None Help from another person taking care of personal grooming?: A Little Help from another person toileting, which includes using toliet, bedpan, or urinal?: A Little Help from another person bathing (including washing, rinsing, drying)?: A Little Help from another person to put on and taking off regular upper body clothing?: None Help from another person to put on and taking off regular lower body clothing?: A Little 6 Click Score: 20   End of Session Equipment Utilized During Treatment: Rolling walker Nurse Communication: Mobility status  Activity Tolerance: Patient tolerated treatment well Patient left: in bed;with call bell/phone within reach;with family/visitor present  OT Visit Diagnosis: Unsteadiness on feet (R26.81);Other symptoms and signs involving cognitive function                Time: 3762-8315 OT Time Calculation (min): 19 min Charges:  OT General Charges $OT Visit: 1 Visit OT Evaluation $OT Eval Low Complexity: [place], OTR/L Acute Rehab Pager: [phone] Office: [place] 04/25/2018, 5:07 PM

## 2018-04-25 NOTE — Progress Notes (Addendum)
PROGRESS NOTE    Jose [name]  KVQ:[id number] DOB: 04-20-1924 DOA: 04/22/2018 PCP: [name], MD     Brief Narrative:  Jose [name] is a 84 year old male with past medical history of hypertension, history of acute cholecystitis status post laparoscopic cholecystectomy, ascending colitis and bacteremia with distal CBD stricture, status post sphincterotomy, plastic stent placement.  Patient was hospitalized 4 weeks ago, underwent ERCP and found CBD stent in the duodenum, distally displaced.  Stent was removed, replaced with bare-metal stent.  Patient was admitted earlier this week with pneumonia.  He was discharged home with 7-day tx Bactrim.  After patient was discharged home, patient was found to have a fever of 103, chills, weak.  He was found to have slurred speech, which family notes that is common when he has sepsis-like symptoms.  Patient was readmitted for treatment.  Due to elevation in liver enzymes, bilirubin, GI was consulted.  New events last 24 hours / Subjective: States that he did not sleep a wink last night  Assessment & Plan:   Principal Problem:   Sepsis ([place]) Active Problems:   Essential hypertension, benign   Elevated liver enzymes   Malnutrition of moderate degree   BPH (benign prostatic hyperplasia)   Sepsis secondary to bilateral lobar pneumonia Chest x-ray was reviewed independently by myself, reveals bibasilar effusion/consolidation Influenza PCR, respiratory panel was negative previous admission 2 days ago and was not repeated upon this admission Blood cultures negative to date Empiric cefepime  IVF   Elevated liver enzymes History of cholangitis, CBD stricture status post stent placement, replacement Patient denies any abdominal pain LFTs are normal now  Appreciate GI consultation, currently does not feel sepsis is secondary to cholangitis  Essential hypertension Continue Toprol   BPH Continue Flomax  Hypokalemia Replace,  trend  Chronic normocytic anemia Continue iron   Restless leg syndrome Started on mirapex, tolerating well     DVT prophylaxis: Lovenox Code Status: DNR Family Communication: Daughter in law at bedside  Disposition Plan: Pending further improvement in sepsis pathology.  Patient is a 84yo with readmission for pneumonia, will be cautious in discharge planning   Consultants:   GI  Procedures:   None  Antimicrobials:  Anti-infectives (From admission, onward)   Start     Dose/Rate Route Frequency Ordered Stop   04/23/18 2200  ceFEPIme (MAXIPIME) 2 g in sodium chloride 0.9 % 100 mL IVPB     2 g 200 mL/hr over 30 Minutes Intravenous Every 24 hours 04/23/18 0957     04/23/18 0100  ceFEPIme (MAXIPIME) 2 g in sodium chloride 0.9 % 100 mL IVPB     2 g 200 mL/hr over 30 Minutes Intravenous  Once 04/23/18 0045 04/23/18 0236   04/23/18 0100  metroNIDAZOLE (FLAGYL) IVPB 500 mg     500 mg 100 mL/hr over 60 Minutes Intravenous Every 8 hours 04/23/18 0045     04/23/18 0100  vancomycin (VANCOCIN) IVPB 1000 mg/200 mL premix  Status:  Discontinued     1,000 mg 200 mL/hr over 60 Minutes Intravenous  Once 04/23/18 0045 04/23/18 0053   04/23/18 0100  vancomycin (VANCOCIN) 1,250 mg in sodium chloride 0.9 % 250 mL IVPB     1,250 mg 166.7 mL/hr over 90 Minutes Intravenous  Once 04/23/18 0053 04/23/18 0506       Objective: Vitals:   04/24/18 1033 04/24/18 1418 04/24/18 2101 04/25/18 0541  BP:  (!) 148/86 134/67 (!) 146/74  Pulse:  (!) 115 96 91  Resp:  20 16 20   Temp:  98.3 F (36.8 C) 98.4 F (36.9 C) 98.2 F (36.8 C)  TempSrc:  Oral Oral Oral  SpO2:  97% 97% 95%  Weight:    57.3 kg  Height: 5\' 10"  (1.778 m)       Intake/Output Summary (Last 24 hours) at 04/25/2018 1529 Last data filed at 04/25/2018 1107 Gross per 24 hour  Intake 3674.99 ml  Output 4 ml  Net 3670.99 ml   Filed Weights   04/25/18 0541  Weight: 57.3 kg     Examination: General exam: Appears calm and  comfortable, hard of hearing  Respiratory system: Diminished bibasilar. Respiratory effort normal on room air  Cardiovascular system: S1 & S2 heard, RRR. No JVD, murmurs, rubs, gallops or clicks. No pedal edema. Gastrointestinal system: Abdomen is nondistended, soft and nontender. No organomegaly or masses felt. Normal bowel sounds heard. Central nervous system: Alert and oriented. No focal neurological deficits. Extremities: Symmetric 5 x 5 power. Skin: No rashes, lesions or ulcers Psychiatry: Judgement and insight appear normal. Mood & affect appropriate.    Data Reviewed: I have personally reviewed following labs and imaging studies  CBC: Recent Labs  Lab 04/19/18 1900 04/20/18 0240 04/21/18 0219 04/21/18 2015 04/22/18 0217 04/23/18 0010 04/24/18 0351 04/25/18 0437  WBC 11.4* 10.8* 7.6  --  5.9 6.0 6.3 5.2  NEUTROABS 10.5* 9.1*  --   --   --  4.8  --   --   HGB 10.1* 7.8* 6.9* 9.4* 8.7* 10.6* 9.9* 9.2*  HCT 32.4* 24.7* 22.8* 28.4* 27.0* 33.1* 31.4* 28.6*  MCV 103.8* 102.5* 104.1*  --  99.6 98.8 100.6* 99.0  PLT 165 106* 97*  --  104* 118* 107* 456*   Basic Metabolic Panel: Recent Labs  Lab 04/20/18 0128 04/21/18 0219 04/22/18 0217 04/23/18 0010 04/24/18 0351 04/25/18 0437  NA 133* 137 138 134* 139 139  K 3.4* 3.4* 3.9 3.7 3.3* 3.4*  CL 107 110 108 102 109 110  CO2 19* 22 21* 21* 22 20*  GLUCOSE 132* 101* 93 224* 108* 98  BUN 15 11 7* 7* 8 6*  CREATININE 0.85 0.75 0.69 0.86 0.87 0.72  CALCIUM 7.0* 7.6* 8.2* 8.4* 8.2* 8.1*  MG 1.4* 1.9  --   --   --  1.7   GFR: Estimated Creatinine Clearance: 47.8 mL/min (by C-G formula based on SCr of 0.72 mg/dL). Liver Function Tests: Recent Labs  Lab 04/20/18 0240 04/21/18 0219 04/23/18 0010 04/24/18 0351 04/25/18 0437  AST 31 22 173* 55* 32  ALT 29 26 99* 66* 44  ALKPHOS 176* 161* 574* 449* 347*  BILITOT 1.3* 0.9 3.3* 2.0* 1.7*  PROT 5.2* 4.7* 6.0* 5.5* 5.2*  ALBUMIN 2.2* 2.0* 2.6* 2.4* 2.3*   Recent Labs  Lab  04/19/18 1900 04/23/18 0010  LIPASE 36 27   No results for input(s): AMMONIA in the last 168 hours. Coagulation Profile: Recent Labs  Lab 04/19/18 1900  INR 1.31   Cardiac Enzymes: No results for input(s): CKTOTAL, CKMB, CKMBINDEX, TROPONINI in the last 168 hours. BNP (last 3 results) No results for input(s): PROBNP in the last 8760 hours. HbA1C: No results for input(s): HGBA1C in the last 72 hours. CBG: No results for input(s): GLUCAP in the last 168 hours. Lipid Profile: No results for input(s): CHOL, HDL, LDLCALC, TRIG, CHOLHDL, LDLDIRECT in the last 72 hours. Thyroid Function Tests: No results for input(s): TSH, T4TOTAL, FREET4, T3FREE, THYROIDAB in the last 72 hours. Anemia Panel: No results  for input(s): VITAMINB12, FOLATE, FERRITIN, TIBC, IRON, RETICCTPCT in the last 72 hours. Sepsis Labs: Recent Labs  Lab 04/19/18 2249 04/20/18 0128 04/23/18 0032 04/23/18 0457  PROCALCITON  --  4.16  --   --   LATICACIDVEN 3.48* 2.3* 2.73* 0.9    Recent Results (from the past 240 hour(s))  Culture, blood (routine x 2)     Status: None   Collection Time: 04/19/18  7:00 PM  Result Value Ref Range Status   Specimen Description BLOOD SITE NOT SPECIFIED  Final   Special Requests   Final    BOTTLES DRAWN AEROBIC AND ANAEROBIC Blood Culture adequate volume   Culture   Final    NO GROWTH 5 DAYS Performed at [facility], [place] [address] [place], [place] [address]    Report Status 04/25/2018 FINAL  Final  Culture, blood (routine x 2)     Status: None   Collection Time: 04/19/18  8:10 PM  Result Value Ref Range Status   Specimen Description BLOOD LEFT ARM  Final   Special Requests   Final    BOTTLES DRAWN AEROBIC AND ANAEROBIC Blood Culture results may not be optimal due to an inadequate volume of blood received in culture bottles   Culture   Final    NO GROWTH 5 DAYS Performed at [facility], [place] [address], [place], [place] [address]    Report Status 04/25/2018  FINAL  Final  Urine Culture     Status: None   Collection Time: 04/19/18  8:25 PM  Result Value Ref Range Status   Specimen Description URINE, CATHETERIZED  Final   Special Requests NONE  Final   Culture   Final    NO GROWTH Performed at [facility], [place] [address], [place], [place] [address]    Report Status 04/21/2018 FINAL  Final  Culture, sputum-assessment     Status: None   Collection Time: 04/20/18 11:24 AM  Result Value Ref Range Status   Specimen Description SPUTUM  Final   Special Requests NONE  Final   Sputum evaluation   Final    THIS SPECIMEN IS ACCEPTABLE FOR SPUTUM CULTURE Performed at [facility], [place] [address], [place], [place] [address]    Report Status 04/20/2018 FINAL  Final  Culture, respiratory     Status: None   Collection Time: 04/20/18 11:24 AM  Result Value Ref Range Status   Specimen Description SPUTUM  Final   Special Requests NONE Reflexed from [id number]  Final   Gram Stain   Final    RARE WBC PRESENT, PREDOMINANTLY PMN FEW GRAM POSITIVE COCCI IN PAIRS IN CLUSTERS FEW SQUAMOUS EPITHELIAL CELLS PRESENT    Culture   Final    MODERATE Consistent with normal respiratory flora. Performed at [facility], [place] [address], [place], [place] [address]    Report Status 04/22/2018 FINAL  Final  Blood Culture (routine x 2)     Status: None (Preliminary result)   Collection Time: 04/23/18 12:43 AM  Result Value Ref Range Status   Specimen Description BLOOD RIGHT ANTECUBITAL  Final   Special Requests   Final    BOTTLES DRAWN AEROBIC AND ANAEROBIC Blood Culture adequate volume   Culture   Final    NO GROWTH 2 DAYS Performed at [facility], [place] [address], [place],  [address]    Report Status PENDING  Incomplete  Blood Culture (routine x 2)     Status:  None (Preliminary result)   Collection Time: 04/23/18 12:48 AM  Result Value Ref Range Status   Specimen Description BLOOD RIGHT FOREARM  Final   Special Requests   Final     BOTTLES DRAWN AEROBIC AND ANAEROBIC Blood Culture results may not be optimal due to an inadequate volume of blood received in culture bottles   Culture   Final    NO GROWTH 2 DAYS Performed at [place] [address], [place], [place] [address]    Report Status PENDING  Incomplete  Urine culture     Status: None   Collection Time: 04/23/18  2:39 AM  Result Value Ref Range Status   Specimen Description URINE, RANDOM  Final   Special Requests NONE  Final   Culture   Final    NO GROWTH Performed at [facility], [address] [place], [place] [address]    Report Status 04/24/2018 FINAL  Final       Radiology Studies: No results found.    Scheduled Meds: . enoxaparin (LOVENOX) injection  40 mg Subcutaneous Q24H  . feeding supplement (ENSURE ENLIVE)  237 mL Oral BID BM  . ferrous sulfate  325 mg Oral Q breakfast  . metoprolol succinate  25 mg Oral Daily  . multivitamin with minerals  1 tablet Oral Daily  . tamsulosin  0.4 mg Oral Daily  . traZODone  50 mg Oral QHS  . vitamin B-12  500 mcg Oral Daily   Continuous Infusions: . sodium chloride 75 mL/hr at 04/25/18 0807  . ceFEPime (MAXIPIME) IV 2 g (04/24/18 2133)  . metronidazole 500 mg (04/25/18 7893)     LOS: 2 days    Time spent: 25 minutes   Dessa Phi, DO [facility] [email or web address] Password [email or web address] 04/25/2018, 3:29 PM

## 2018-04-25 NOTE — Discharge Summary (Signed)
[facility] Discharge Summary   Patient: Jose [name] RCV:[id number]   PCP: [name], MD DOB: 20-Apr-1924   Date of admission: 04/19/2018   Date of discharge: 04/22/2018     Discharge Diagnoses:   Principal Problem:   Sepsis ([place]) Active Problems:   Essential hypertension, benign   Lobar pneumonia ([place])   Sepsis due to pneumonia [facility])   Admitted From: home Disposition:  Home with home health  Recommendations for Outpatient Follow-up:  1. Please follow up withPCP in 1 week   Follow-up Information    [name], MD. Schedule an appointment as soon as possible for a visit in 1 week(s).   Specialties:  Internal Medicine, Pediatrics Contact information: [place] [address] [place], [facility] Follow up.   Specialty:  Home Health Services Why:  Resume HHRN, HHPT, HHOT Contact information: [address] [place] [address] [phone]          Diet recommendation: cardiac diet  Activity: The patient is advised to gradually reintroduce usual activities.  Discharge Condition: good  Code Status: DNR DNI  History of present illness: As per the H and P dictated on admission, " [name] is a 84 y.o. 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  Patient was brought to the ER.  As per the patient's family was at the bedside patient was not having any abdominal pain nausea vomiting chest pain or shortness of breath prior to the sudden change in mental status.  ED Course: In the ER patient was hypotensive febrile with temperature 102 F WBC count of 11.4 LFTs are normal.  CT abdomen pelvis showed possibility of early pancreatitis and bilateral pneumonia.  Patient was placed on  empiric antibiotics for sepsis and started on sepsis protocol IV fluids.  On my exam patient is confused but follows commands and moves all extremities.  CT head was unremarkable.  EKG shows sinus tachycardia."  Hospital Course:  Summary of his active problems in the hospital is as following. Severe sepsis secondary to bilateral lower lobe pneumonia -Patient has a history of recent ESBL bacteremia, he was placed on vancomycin and meropenem, improving,  Blood culture and sputum culture are negative Pt nontoxic appearing  Will d/c home on oral bactrim with intention to cover E coli and pneumonia  Additional Problems Recent stent placement for CBD stricture and acute cholangitis with ESBL bacteremia -Stable, LFTs are unremarkable, CT scan of the abdomen and pelvis without significant acute findings Outpatient gastroenterology follow up recommended   Hypertension -Antihypertensives were held in the setting of sepsis, blood pressure in the 120s-130s resume on discharge.   Acute on chronic anemia /thrombocytopenia -Patient with more decline of hemoglobin at 6.9 this morning, no apparent blood loss, no melena, no blood in the stool, this is likely in the setting of severe sepsis, it is also accompanied by thrombocytopenia probably also in the setting of sepsis Now stable  Possible early pancreatitis -Per CT scan, he denies any abdominal pain, nausea or vomiting, is tolerating a regular diet well  Hypokalemia/hypomagnesemia -Magnesium now normal, repeted K  All other chronic medical condition were stable during the hospitalization.  Patient was seen by physical therapy, who recommended home health, which was arranged by social worker  and case Freight forwarder. On the day of the discharge the patient's vitals were stable , and no other acute medical condition were reported by patient. the patient was felt safe to be discharge at home with home health.  Consultants: none Procedures:  none  DISCHARGE MEDICATION: Allergies as of 04/22/2018   No Known Allergies     Medication List    TAKE these medications   feeding supplement (ENSURE ENLIVE) Liqd Take 6 mLs by mouth 2 (two) times daily between meals.   ferrous sulfate 325 (65 FE) MG EC tablet Take 325 mg by mouth daily with breakfast.   gabapentin 600 MG tablet Commonly known as:  NEURONTIN TAKE 1 TABLET (600 MG TOTAL) BY MOUTH 2 (TWO) TIMES DAILY. What changed:  See the new instructions.   ketoconazole 2 % cream Commonly known as:  NIZORAL Apply 1 application topically 2 (two) times daily as needed for irritation.   multivitamin with minerals Tabs tablet Take 1 tablet by mouth daily.   sulfamethoxazole-trimethoprim 800-160 MG tablet Commonly known as:  BACTRIM DS,SEPTRA DS Take 1 tablet by mouth 2 (two) times daily for 5 days.   tamsulosin 0.4 MG Caps capsule Commonly known as:  FLOMAX TAKE 1 CAPSULE (0.4 MG TOTAL) BY MOUTH DAILY. What changed:  See the new instructions.   triamcinolone cream 0.1 % Commonly known as:  KENALOG Apply 1 application topically 2 (two) times daily as needed. What changed:  reasons to take this   vitamin B-12 500 MCG tablet Commonly known as:  CYANOCOBALAMIN Take 500 mcg by mouth daily.      No Known Allergies Discharge Instructions    Diet - low sodium heart healthy   Complete by:  As directed    Discharge instructions   Complete by:  As directed    It is important that you read following instructions as well as go over your medication list with RN to help you understand your care after this hospitalization.  Discharge Instructions: Please follow-up with PCP in one week  Please request your primary care physician to go over all Hospital Tests and Procedure/Radiological results at the follow up,  Please get all Hospital records sent to your PCP by signing hospital release before you go home.   Do not take more than prescribed Pain, Sleep and Anxiety  Medications. You were cared for by a hospitalist during your hospital stay. If you have any questions about your discharge medications or the care you received while you were in the hospital after you are discharged, you can call the unit you were admitted to and ask to speak with the hospitalist on call if the hospitalist that took care of you is not available.  Once you are discharged, your primary care physician will handle any further medical issues. Please note that NO REFILLS for any discharge medications will be authorized once you are discharged, as it is imperative that you return to your primary care physician (or establish a relationship with a primary care physician if you do not have one) for your aftercare needs so that they can reassess your need for medications and monitor your lab values. You Must read complete instructions/literature along with all the possible adverse reactions/side effects for all the Medicines you take and that have been prescribed to you. Take any new Medicines after you have completely understood and accept all the possible adverse reactions/side effects.   Increase activity slowly   Complete by:  As directed      Discharge  Exam: Filed Weights   04/20/18 1455 04/22/18 0456  Weight: 59.7 kg 64.4 kg   Vitals:   04/22/18 0036 04/22/18 0856  BP: 139/80 131/75  Pulse: 80 86  Resp: (!) 2   Temp: (!) 97.5 F (36.4 C) 98 F (36.7 C)  SpO2: 95% 94%   General: Appear in no distress, no Rash; Oral Mucosa moist. Cardiovascular: S1 and S2 Present, no Murmur, no JVD Respiratory: Bilateral Air entry present and Clear to Auscultation, no Crackles, no wheezes Abdomen: Bowel Sound present, Soft and no tenderness Extremities: no Pedal edema, no calf tenderness Neurology: Grossly no focal neuro deficit.no  The results of significant diagnostics from this hospitalization (including imaging, microbiology, ancillary and laboratory) are listed below for reference.     Significant Diagnostic Studies:  Dg Chest 2 View  Result Date: 04/19/2018 CLINICAL DATA:  Fever.  Disorientation. EXAM: CHEST - 2 VIEW COMPARISON:  March 11, 2018 FINDINGS: The heart size and mediastinal contours are within normal limits. Both lungs are clear. The visualized skeletal structures are unremarkable. IMPRESSION: No active cardiopulmonary disease. Electronically Signed   By: Dorise Bullion III M.D   On: 04/19/2018 19:29   Ct Head Wo Contrast  Result Date: 04/19/2018 CLINICAL DATA:  Patient found today with altered mental status and fever. EXAM: CT HEAD WITHOUT CONTRAST TECHNIQUE: Contiguous axial images were obtained from the base of the skull through the vertex without intravenous contrast. COMPARISON:  None. FINDINGS: Brain: Ventricles and cisterns are normal. Mild prominence of the CSF spaces compatible with age related atrophic change. Minimal chronic ischemic microvascular disease. Old lacunar infarct over the right periventricular white matter. No mass, mass effect, shift of midline structures or acute hemorrhage. No evidence of acute infarction. Vascular: No hyperdense vessel or unexpected calcification. Skull: Normal. Negative for fracture or focal lesion. Sinuses/Orbits: Orbits are normal. Minimal mucosal membrane thickening over the floor the right maxillary sinus. Other: None. IMPRESSION: No acute findings. Mild atrophy and chronic ischemic microvascular disease. Small old right periventricular lacunar infarct. Electronically Signed   By: Marin Olp M.D.   On: 04/19/2018 19:50   Ct Abdomen Pelvis W Contrast  Result Date: 04/19/2018 CLINICAL DATA:  Abdominal pain.  Sepsis. EXAM: CT ABDOMEN AND PELVIS WITH CONTRAST TECHNIQUE: Multidetector CT imaging of the abdomen and pelvis was performed using the standard protocol following bolus administration of intravenous contrast. CONTRAST:  169mL OMNIPAQUE IOHEXOL 300 MG/ML  SOLN COMPARISON:  Plain films 04/02/2018.  CT 03/27/2018.  FINDINGS: Lower chest: Ground-glass airspace disease in the lower lobes bilaterally compatible with pneumonia. Heart is mildly enlarged. No effusions. Hepatobiliary: Prior cholecystectomy. There is a metallic stent within the common bile duct and pneumobilia and mild intrahepatic and extrahepatic biliary ductal dilatation. No focal hepatic abnormality. Pancreas: Pancreatic ductal dilatation is noted and stable. Again noted is an area of focal low-density within the uncinate process measuring 1.5 cm, stable. Slight haziness/stranding around the pancreas. Cannot exclude early acute pancreatitis. Spleen: No focal abnormality.  Normal size. Adrenals/Urinary Tract: Areas of cortical thinning and scarring in the left kidney. Small bilateral renal cysts. No hydronephrosis. Adrenal glands and urinary bladder unremarkable. Stomach/Bowel: Sigmoid diverticulosis. No active diverticulitis. Normal appendix. Stomach and small bowel decompressed, unremarkable. Vascular/Lymphatic: Aortic atherosclerosis. No enlarged abdominal or pelvic lymph nodes. Reproductive: Prostate enlargement. Other: No free fluid or free air. Musculoskeletal: Degenerative changes in the lumbar spine. No acute bony abnormality. IMPRESSION: Ground-glass airspace consolidation in the lower lobes bilaterally compatible with pneumonia. Prior metallic stenting of the common bile  duct and cholecystectomy. Pneumobilia and mild biliary ductal dilatation. Slight stranding around the pancreas may reflect early acute pancreatitis. Stable low-density area within the uncinate process. Sigmoid diverticulosis.  No active diverticulitis. Prostate enlargement. Electronically Signed   By: Rolm Baptise M.D.   On: 04/19/2018 22:11   Ct Abdomen Pelvis W Contrast  Result Date: 03/27/2018 CLINICAL DATA:  Acute onset of generalized weakness. Elevated bilirubin. EXAM: CT ABDOMEN AND PELVIS WITH CONTRAST TECHNIQUE: Multidetector CT imaging of the abdomen and pelvis was performed  using the standard protocol following bolus administration of intravenous contrast. CONTRAST:  129mL OMNIPAQUE IOHEXOL 300 MG/ML  SOLN COMPARISON:  CT of the abdomen and pelvis performed 03/11/2018, and MRCP performed 03/12/2018 FINDINGS: Lower chest: Mild bibasilar airspace opacities likely reflect atelectasis. Scattered coronary artery calcifications are seen. Hepatobiliary: There is diffuse dilatation of the intrahepatic biliary ducts and common bile duct, as previously noted, with a common bile duct stent seen in expected position. There is also diffuse dilatation of the pancreatic duct. The liver is otherwise grossly unremarkable in appearance. The patient is status post cholecystectomy, with clips noted at the gallbladder fossa. Pancreas: Diffuse soft tissue inflammation about the pancreas is compatible with pancreatitis, as previously noted, with slightly decreased enhancement noted at the uncinate process. No definite pseudocyst formation is noted. Spleen: The spleen is unremarkable in appearance. Adrenals/Urinary Tract: The adrenal glands are unremarkable in appearance. Small left renal cysts are noted. There is no evidence of hydronephrosis. No renal or ureteral stones are identified. No perinephric stranding is seen. Stomach/Bowel: The stomach is unremarkable in appearance. The small bowel is within normal limits. The appendix is normal in caliber, without evidence of appendicitis. The colon is unremarkable in appearance. Vascular/Lymphatic: Scattered calcification is seen along the abdominal aorta and its branches. The abdominal aorta is otherwise grossly unremarkable. The inferior vena cava is grossly unremarkable. No retroperitoneal lymphadenopathy is seen. No pelvic sidewall lymphadenopathy is identified. Reproductive: The bladder is mildly distended and grossly unremarkable. The prostate is borderline normal in size. Other: No additional soft tissue abnormalities are seen. Musculoskeletal: No acute  osseous abnormalities are identified. Multilevel vacuum phenomenon is noted along the lower thoracic and lumbar spine, with mild underlying facet disease. The visualized musculature is unremarkable in appearance. IMPRESSION: 1. Diffuse soft tissue inflammation about the pancreas is compatible with acute pancreatitis, as previously noted, with slightly decreased enhancement noted at the uncinate process. No definite pseudocyst formation seen. 2. Diffuse dilatation of the intrahepatic biliary ducts and common bile duct, as previously noted, with a common bile duct stent seen in expected position. Diffuse dilatation of the pancreatic duct. 3. Mild bibasilar airspace opacities likely reflect atelectasis. 4. Scattered coronary artery calcifications seen. 5. Small left renal cysts noted. 6. Mild degenerative change along the lower thoracic and lumbar spine. Aortic Atherosclerosis (ICD10-I70.0). Electronically Signed   By: Garald Balding M.D.   On: 03/27/2018 23:24   Dg Ercp  Result Date: 03/29/2018 CLINICAL DATA:  84 year old male with acute pancreatitis and biliary ductal dilatation. EXAM: ERCP TECHNIQUE: Multiple spot images obtained with the fluoroscopic device and submitted for interpretation post-procedure. FLUOROSCOPY TIME:  Fluoroscopy Time:  1 minutes 34 seconds reported COMPARISON:  None. FINDINGS: A single intraoperative saved image is submitted for review. The image demonstrates a flexible endoscope in the descending duodenum with partial wire cannulation. No cholangiogram images were submitted. Please see procedural note for further detail. IMPRESSION: ERCP as above.  Please see procedural note for further detail. These images were submitted for  radiologic interpretation only. Please see the procedural report for the amount of contrast and the fluoroscopy time utilized. Electronically Signed   By: Jacqulynn Cadet M.D.   On: 03/29/2018 10:57   Dg Ercp Biliary & Pancreatic Ducts  Result Date:  03/30/2018 CLINICAL DATA:  ERCP with biliary stent placement. EXAM: ERCP TECHNIQUE: Multiple spot images obtained with the fluoroscopic device and submitted for interpretation post-procedure. FLUOROSCOPY TIME:  292 seconds COMPARISON:  ERCP-03/29/2018; 03/13/2018; CT of the abdomen and pelvis-03/27/2018 FINDINGS: Five spot intraoperative fluoroscopic images during ERCP are provided for review. Initial image demonstrates an ERCP probe overlying the right upper abdominal quadrant. There is selective cannulation and opacification of the common bile duct which appears mild to moderately dilated. There is minimal opacification intrahepatic biliary tree which demonstrates apparent mild diffuse beaded irregularity. Subsequent images demonstrate insufflation of a balloon within the mid aspect of the CBD with subsequent presumed biliary sweeping and sphincterotomy. Despite this, there is a persistent relatively long segment narrowing involving the distal aspect of the CBD. Completion image demonstrates placement of an internal metallic biliary stent traversing the long segment narrowing of the CBD with passage of contrast to the level of the duodenum. IMPRESSION: ERCP with long segment severe narrowing of the distal aspect of the CBD with subsequent internal metallic biliary stent placement. These images were submitted for radiologic interpretation only. Please see the procedural report for the amount of contrast and the fluoroscopy time utilized. Electronically Signed   By: Sandi Mariscal M.D.   On: 03/30/2018 13:51   Dg Abd 2 Views  Result Date: 04/02/2018 CLINICAL DATA:  Evaluate biliary stent. EXAM: ABDOMEN - 2 VIEW COMPARISON:  03/30/2018 ERCP FINDINGS: A CBD stent is noted, and may be slightly more cephalad when compared to 03/30/2018 images. Bowel gas pattern is unremarkable. Cholecystectomy clips noted. No acute or suspicious bony abnormalities identified. IMPRESSION: CBD stent which may be slightly more cephalad  in position when compared to 03/30/2018 ERCP images. Electronically Signed   By: Margarette Canada M.D.   On: 04/02/2018 12:49    Labs: CBC: Recent Labs  Lab 04/19/18 1900 04/20/18 0240 04/21/18 0219 04/21/18 2015 04/22/18 0217  WBC 11.4* 10.8* 7.6  --  5.9  NEUTROABS 10.5* 9.1*  --   --   --   HGB 10.1* 7.8* 6.9* 9.4* 8.7*  HCT 32.4* 24.7* 22.8* 28.4* 27.0*  MCV 103.8* 102.5* 104.1*  --  99.6  PLT 165 106* 97*  --  440*   Basic Metabolic Panel: Recent Labs  Lab 04/20/18 0128 04/21/18 0219 04/22/18 0217  NA 133* 137 138  K 3.4* 3.4* 3.9  CL 107 110 108  CO2 19* 22 21*  GLUCOSE 132* 101* 93  BUN 15 11 7*  CREATININE 0.85 0.75 0.69  CALCIUM 7.0* 7.6* 8.2*  MG 1.4* 1.9  --    Liver Function Tests: Recent Labs  Lab 04/20/18 0240 04/21/18 0219 04/23/18 0010  AST 31 22 173*  ALT 29 26 99*  ALKPHOS 176* 161* 574*  BILITOT 1.3* 0.9 3.3*  PROT 5.2* 4.7* 6.0*  ALBUMIN 2.2* 2.0* 2.6*   Lab 04/19/18 1900  LIPASE 36   No results for input(s): AMMONIA in the last 168 hours. Cardiac Enzymes: No results for input(s): CKTOTAL, CKMB, CKMBINDEX, TROPONINI in the last 168 hours. BNP (last 3 results) No results for input(s): BNP in the last 8760 hours. CBG: No results for input(s): GLUCAP in the last 168 hours. Time spent:  minutes  Signed:  Diamantina Providence  Laraine Samet  [facility] 04/22/2018 , 8:11 AM

## 2018-04-25 NOTE — Plan of Care (Signed)
Patient had questions about his treatment plan. Answered all questions and explained what medications he was currently receiving. Patient verbalized having understanding of the information provided. Will continue to monitor for patient's needs.

## 2018-04-25 NOTE — Progress Notes (Signed)
[facility] PROGRESS NOTE Subjective Patient states that he feels okay not having any abdominal pain  Objective: Vital signs in last 24 hours: Temp:  [98.2 F (36.8 C)-98.4 F (36.9 C)] 98.2 F (36.8 C) (12/07 0541) Pulse Rate:  [91-115] 91 (12/07 0541) Resp:  [16-20] 20 (12/07 0541) BP: (134-148)/(67-86) 146/74 (12/07 0541) SpO2:  [95 %-97 %] 95 % (12/07 0541) Weight:  [57.3 kg] 57.3 kg (12/07 0541) Last BM Date: 04/25/18  Intake/Output from previous day: 12/06 0701 - 12/07 0700 In: 3225.9 [P.O.:420; I.V.:2405.9; IV Piggyback:400] Out: 4 [Urine:3; Stool:1] Intake/Output this shift: No intake/output data recorded.  PE: General--no acute distress  Abdomen--soft and completely nontender. Lab Results: Recent Labs    04/23/18 0010 04/24/18 0351 04/25/18 0437  WBC 6.0 6.3 5.2  HGB 10.6* 9.9* 9.2*  HCT 33.1* 31.4* 28.6*  PLT 118* 107* 104*   BMET Recent Labs    04/23/18 0010 04/24/18 0351 04/25/18 0437  NA 134* 139 139  K 3.7 3.3* 3.4*  CL 102 109 110  CO2 21* 22 20*  CREATININE 0.86 0.87 0.72   LFT Recent Labs    04/23/18 0010 04/24/18 0351 04/25/18 0437  PROT 6.0* 5.5* 5.2*  AST 173* 55* 32  ALT 99* 66* 44  ALKPHOS 574* 449* 347*  BILITOT 3.3* 2.0* 1.7*  BILIDIR  --   --  0.7*  IBILI  --   --  1.0*   PT/INR No results for input(s): LABPROT, INR in the last 72 hours. PANCREAS Recent Labs    04/23/18 0010  LIPASE 27         Studies/Results: No results found.  Medications: I have reviewed the patient's current medications.  Assessment:   1.  Probable sepsis.  Suspect this is due to pneumonia.  His LFTs are slightly elevated but continued to improve.  His abdomen is nontender.  Do not feel he has cholangitis.  Dr. Watt Climes will be on the next week and if the patient is still in the hospital Monday he will check them out if anything happens between now and then please give me a call   Plan: Dr. Watt Climes to see Monday   Nancy Fetter 04/25/2018, 7:51 AM  This note was created using voice recognition software. Minor errors may Have occurred unintentionally.  Pager: [phone] If no answer or after hours call [phone]

## 2018-04-26 ENCOUNTER — Inpatient Hospital Stay (HOSPITAL_COMMUNITY): Payer: Medicare Other

## 2018-04-26 LAB — CBC
HCT: 27.6 % — ABNORMAL LOW (ref 39.0–52.0)
Hemoglobin: 8.9 g/dL — ABNORMAL LOW (ref 13.0–17.0)
MCH: 32.1 pg (ref 26.0–34.0)
MCHC: 32.2 g/dL (ref 30.0–36.0)
MCV: 99.6 fL (ref 80.0–100.0)
PLATELETS: 95 10*3/uL — AB (ref 150–400)
RBC: 2.77 MIL/uL — ABNORMAL LOW (ref 4.22–5.81)
RDW: 16.4 % — ABNORMAL HIGH (ref 11.5–15.5)
WBC: 4.3 10*3/uL (ref 4.0–10.5)
nRBC: 0 % (ref 0.0–0.2)

## 2018-04-26 LAB — BASIC METABOLIC PANEL
Anion gap: 9 (ref 5–15)
BUN: 6 mg/dL — ABNORMAL LOW (ref 8–23)
CO2: 22 mmol/L (ref 22–32)
Calcium: 8.2 mg/dL — ABNORMAL LOW (ref 8.9–10.3)
Chloride: 107 mmol/L (ref 98–111)
Creatinine, Ser: 0.78 mg/dL (ref 0.61–1.24)
Glucose, Bld: 115 mg/dL — ABNORMAL HIGH (ref 70–99)
Potassium: 3.1 mmol/L — ABNORMAL LOW (ref 3.5–5.1)
SODIUM: 138 mmol/L (ref 135–145)

## 2018-04-26 LAB — HEPATIC FUNCTION PANEL
ALT: 32 U/L (ref 0–44)
AST: 21 U/L (ref 15–41)
Albumin: 2.3 g/dL — ABNORMAL LOW (ref 3.5–5.0)
Alkaline Phosphatase: 322 U/L — ABNORMAL HIGH (ref 38–126)
Bilirubin, Direct: 0.6 mg/dL — ABNORMAL HIGH (ref 0.0–0.2)
Indirect Bilirubin: 0.8 mg/dL (ref 0.3–0.9)
Total Bilirubin: 1.4 mg/dL — ABNORMAL HIGH (ref 0.3–1.2)
Total Protein: 5.3 g/dL — ABNORMAL LOW (ref 6.5–8.1)

## 2018-04-26 LAB — MAGNESIUM: Magnesium: 1.6 mg/dL — ABNORMAL LOW (ref 1.7–2.4)

## 2018-04-26 MED ORDER — CEFDINIR 300 MG PO CAPS: 300.0000 mg | ORAL_CAPSULE | Freq: Two times a day (BID) | ORAL | 0 refills | Status: AC

## 2018-04-26 MED ORDER — TRAMADOL HCL 50 MG PO TABS
50.0000 mg | ORAL_TABLET | Freq: Once | ORAL | Status: AC
  Administered 2018-04-26: 50 mg via ORAL
  Filled 2018-04-26: qty 1

## 2018-04-26 MED ORDER — MAGNESIUM SULFATE 2 GM/50ML IV SOLN
2.0000 g | Freq: Once | INTRAVENOUS | Status: AC
  Administered 2018-04-26: 2 g via INTRAVENOUS
  Filled 2018-04-26: qty 50

## 2018-04-26 MED ORDER — POTASSIUM CHLORIDE CRYS ER 20 MEQ PO TBCR
40.0000 meq | EXTENDED_RELEASE_TABLET | ORAL | Status: AC
  Administered 2018-04-26 (×2): 40 meq via ORAL
  Filled 2018-04-26 (×2): qty 2

## 2018-04-26 MED ORDER — KETOROLAC TROMETHAMINE 30 MG/ML IJ SOLN: 15.0000 mg | Freq: Once | INTRAMUSCULAR | Status: DC | PRN

## 2018-04-26 MED ORDER — PRAMIPEXOLE DIHYDROCHLORIDE 0.125 MG PO TABS: 0.1250 mg | ORAL_TABLET | Freq: Every day | ORAL | 0 refills | Status: DC | PRN

## 2018-04-26 NOTE — Discharge Summary (Signed)
Physician Discharge Summary  Jose [name] VHQ:[id number] DOB: [birth year] DOA: 04/22/2018  PCP: [name], MD  Admit date: 04/22/2018 Discharge date: 04/26/2018  Admitted From: Home Disposition:  Home  Recommendations for Outpatient Follow-up:  1. Follow up with PCP in 1 week 2. Follow-up on final blood culture results, negative at day of discharge  Home Health: PT OT    Discharge Condition: Stable, improved CODE STATUS: DNR  Diet recommendation: Regular diet   Brief/Interim Summary: Jose [name] is a 84 year old male with past medical history of hypertension, history of acute cholecystitis status post laparoscopic cholecystectomy, ascending colitis and bacteremia with distal CBD stricture, status post sphincterotomy, plastic stent placement.  Patient was hospitalized 4 weeks ago, underwent ERCP and found CBD stent in the duodenum, distally displaced.  Stent was removed, replaced with bare-metal stent.  Patient was admitted earlier this week with pneumonia.  He was discharged home with 7-day tx Bactrim.  After patient was discharged home, patient was found to have a fever of 103, chills, weak.  He was found to have slurred speech, which family notes that is common when he has sepsis-like symptoms.  Patient was readmitted for treatment.  Due to elevation in liver enzymes, bilirubin, GI was consulted.  Without any abdominal complaints, abdominal source was not found to be source of patient's sepsis.  Chest x-ray revealed bibasilar atelectasis, patient was treated with broad-spectrum antibiotics.  He was monitored closely and his LFT, bilirubin continue to decrease.  Repeat chest x-ray on day of discharge revealed near resolution of bibasilar consolidations.  Patient remained afebrile with normal vital signs.  Discharge Diagnoses:  Principal Problem:   Sepsis ([place]) Active Problems:   Essential hypertension, benign   Elevated liver enzymes   Malnutrition of moderate degree    BPH (benign prostatic hyperplasia)  Sepsis secondary to bilateral lobar pneumonia Chest x-ray was reviewed independently by myself, reveals bibasilar effusion/consolidation Influenza PCR, respiratory panel was negative previous admission 2 days ago and was not repeated upon this admission Blood cultures negative to date Repeat chest x-ray on 12/8, repeat reviewed independently by myself which reveals resolution of bibasilar consolidation.  Minimal pleural effusion noted Empiric cefepime --> Omnicef on discharge  Elevated liver enzymes History of cholangitis, CBD stricture status post stent placement, replacement Patient denies any abdominal pain LFTs are normal now, bilirubin continues decrease Appreciate GI consultation, currently does not feel sepsis is secondary to cholangitis  Essential hypertension Continue Toprol   BPH Continue Flomax  Hypokalemia Replace  Hypomagnesemia Replace  Chronic normocytic anemia Continue iron   Restless leg syndrome Started on mirapex, tolerating well     Discharge Instructions  Discharge Instructions    Call MD for:  difficulty breathing, headache or visual disturbances   Complete by:  As directed    Call MD for:  extreme fatigue   Complete by:  As directed    Call MD for:  persistant dizziness or light-headedness   Complete by:  As directed    Call MD for:  persistant nausea and vomiting   Complete by:  As directed    Call MD for:  severe uncontrolled pain   Complete by:  As directed    Call MD for:  temperature >100.4   Complete by:  As directed    Diet general   Complete by:  As directed    Discharge instructions   Complete by:  As directed    You were cared for by a hospitalist during your hospital stay.  If you have any questions about your discharge medications or the care you received while you were in the hospital after you are discharged, you can call the unit and ask to speak with the hospitalist on call if the  hospitalist that took care of you is not available. Once you are discharged, your primary care physician will handle any further medical issues. Please note that NO REFILLS for any discharge medications will be authorized once you are discharged, as it is imperative that you return to your primary care physician (or establish a relationship with a primary care physician if you do not have one) for your aftercare needs so that they can reassess your need for medications and monitor your lab values.   Increase activity slowly   Complete by:  As directed      Allergies as of 04/26/2018   No Known Allergies     Medication List    STOP taking these medications   sulfamethoxazole-trimethoprim 800-160 MG tablet Commonly known as:  BACTRIM DS,SEPTRA DS     TAKE these medications   cefdinir 300 MG capsule Commonly known as:  OMNICEF Take 1 capsule (300 mg total) by mouth 2 (two) times daily for 5 days.   feeding supplement (ENSURE ENLIVE) Liqd Take 6 mLs by mouth 2 (two) times daily between meals.   ferrous sulfate 325 (65 FE) MG EC tablet Take 325 mg by mouth daily with breakfast.   gabapentin 600 MG tablet Commonly known as:  NEURONTIN TAKE 1 TABLET (600 MG TOTAL) BY MOUTH 2 (TWO) TIMES DAILY. What changed:  See the new instructions.   ketoconazole 2 % cream Commonly known as:  NIZORAL Apply 1 application topically 2 (two) times daily as needed for irritation.   metoprolol succinate 25 MG 24 hr tablet Commonly known as:  TOPROL-XL Take 1 tablet (25 mg total) by mouth daily.   multivitamin with minerals Tabs tablet Take 1 tablet by mouth daily.   pramipexole 0.125 MG tablet Commonly known as:  MIRAPEX Take 1 tablet (0.125 mg total) by mouth daily as needed (restless legs).   tamsulosin 0.4 MG Caps capsule Commonly known as:  FLOMAX TAKE 1 CAPSULE (0.4 MG TOTAL) BY MOUTH DAILY. What changed:  See the new instructions.   triamcinolone cream 0.1 % Commonly known as:   KENALOG Apply 1 application topically 2 (two) times daily as needed. What changed:  reasons to take this   vitamin B-12 500 MCG tablet Commonly known as:  CYANOCOBALAMIN Take 500 mcg by mouth daily.      Follow-up Information    [name], MD. Schedule an appointment as soon as possible for a visit in 1 week(s).   Specialties:  Internal Medicine, Pediatrics Contact information: [place] [address] [phone]          No Known Allergies  Consultations:  None   Procedures/Studies: Dg Chest 2 View  Result Date: 04/26/2018 CLINICAL DATA:  Sepsis.  Pneumonia. EXAM: CHEST - 2 VIEW COMPARISON:  04/23/2018 FINDINGS: The heart size and mediastinal contours are within normal limits. Aortic atherosclerosis. There has been near complete resolution of bibasilar atelectasis since previous study. Tiny residual posterior pleural effusions are seen, but also decreased since previous study. No new or worsening areas of pulmonary opacity identified. IMPRESSION: Near complete resolution of bibasilar atelectasis and tiny bilateral pleural effusions. Electronically Signed   By: Earle Gell M.D.   On: 04/26/2018 11:18   Dg Chest 2 View  Result Date: 04/23/2018 CLINICAL DATA:  Fever EXAM: CHEST - 2 VIEW COMPARISON:  04/19/2018 FINDINGS: Heart is normal size. Bibasilar atelectasis or infiltrates. Small effusions. No acute bony abnormality. IMPRESSION: Small bilateral effusions with bibasilar atelectasis or infiltrates. Electronically Signed   By: Rolm Baptise M.D.   On: 04/23/2018 00:30     Discharge Exam: Vitals:   04/25/18 2137 04/26/18 0551  BP: 140/67 140/71  Pulse: 89 79  Resp: 18 18  Temp: 97.9 F (36.6 C) 98.1 F (36.7 C)  SpO2: 95% 95%    General: Pt is alert, awake, not in acute distress Cardiovascular: RRR, S1/S2 +, no rubs, no gallops Respiratory: CTA bilaterally, no wheezing, no rhonchi Abdominal: Soft, NT, ND, bowel sounds + Extremities:  no edema, no cyanosis    The results of significant diagnostics from this hospitalization (including imaging, microbiology, ancillary and laboratory) are listed below for reference.     Microbiology: Recent Results (from the past 240 hour(s))  Culture, blood (routine x 2)     Status: None   Collection Time: 04/19/18  7:00 PM  Result Value Ref Range Status   Specimen Description BLOOD SITE NOT SPECIFIED  Final   Special Requests   Final    BOTTLES DRAWN AEROBIC AND ANAEROBIC Blood Culture adequate volume   Culture   Final    NO GROWTH 5 DAYS Performed at [facility], [address], [place], [place] [address]    Report Status 04/25/2018 FINAL  Final  Culture, blood (routine x 2)     Status: None   Collection Time: 04/19/18  8:10 PM  Result Value Ref Range Status   Specimen Description BLOOD LEFT ARM  Final   Special Requests   Final    BOTTLES DRAWN AEROBIC AND ANAEROBIC Blood Culture results may not be optimal due to an inadequate volume of blood received in culture bottles   Culture   Final    NO GROWTH 5 DAYS Performed at [facility], [place] [address], [place], [place] [address]    Report Status 04/25/2018 FINAL  Final  Urine Culture     Status: None   Collection Time: 04/19/18  8:25 PM  Result Value Ref Range Status   Specimen Description URINE, CATHETERIZED  Final   Special Requests NONE  Final   Culture   Final    NO GROWTH Performed at [facility], [place] [address], [place], [place] [address]    Report Status 04/21/2018 FINAL  Final  Culture, sputum-assessment     Status: None   Collection Time: 04/20/18 11:24 AM  Result Value Ref Range Status   Specimen Description SPUTUM  Final   Special Requests NONE  Final   Sputum evaluation   Final    THIS SPECIMEN IS ACCEPTABLE FOR SPUTUM CULTURE Performed at [facility], [place] [address], [place], [place] [address]    Report Status 04/20/2018 FINAL  Final  Culture, respiratory     Status: None    Collection Time: 04/20/18 11:24 AM  Result Value Ref Range Status   Specimen Description SPUTUM  Final   Special Requests NONE Reflexed from [id number]  Final   Gram Stain   Final    RARE WBC PRESENT, PREDOMINANTLY PMN FEW GRAM POSITIVE COCCI IN PAIRS IN CLUSTERS FEW SQUAMOUS EPITHELIAL CELLS PRESENT    Culture   Final    MODERATE Consistent with normal respiratory flora. Performed at [facility], [place],  [place] [address]    Report Status 04/22/2018 FINAL  Final  Blood Culture (routine x 2)     Status: None (Preliminary result)   Collection Time: 04/23/18 12:43 AM  Result Value Ref Range Status   Specimen Description BLOOD RIGHT ANTECUBITAL  Final   Special Requests   Final    BOTTLES DRAWN AEROBIC AND ANAEROBIC Blood Culture adequate volume   Culture   Final    NO GROWTH 2 DAYS Performed at [facility], [place] [address] [place], [place] [address]    Report Status PENDING  Incomplete  Blood Culture (routine x 2)     Status: None (Preliminary result)   Collection Time: 04/23/18 12:48 AM  Result Value Ref Range Status   Specimen Description BLOOD RIGHT FOREARM  Final   Special Requests   Final    BOTTLES DRAWN AEROBIC AND ANAEROBIC Blood Culture results may not be optimal due to an inadequate volume of blood received in culture bottles   Culture   Final    NO GROWTH 2 DAYS Performed at [facility], [place] [address] [place], [place] [address]    Report Status PENDING  Incomplete  Urine culture     Status: None   Collection Time: 04/23/18  2:39 AM  Result Value Ref Range Status   Specimen Description URINE, RANDOM  Final   Special Requests NONE  Final   Culture   Final    NO GROWTH Performed at [facility], [address] [place], [place] [address]    Report Status 04/24/2018 FINAL  Final     Labs: BNP (last 3 results) No results for input(s): BNP in the last 8760 hours. Basic Metabolic Panel: Recent Labs  Lab 04/20/18 0128  04/21/18 0219 04/22/18 0217 04/23/18 0010 04/24/18 0351 04/25/18 0437 04/26/18 0432  NA 133* 137 138 134* 139 139 138  K 3.4* 3.4* 3.9 3.7 3.3* 3.4* 3.1*  CL 107 110 108 102 109 110 107  CO2 19* 22 21* 21* 22 20* 22  GLUCOSE 132* 101* 93 224* 108* 98 115*  BUN 15 11 7* 7* 8 6* 6*  CREATININE 0.85 0.75 0.69 0.86 0.87 0.72 0.78  CALCIUM 7.0* 7.6* 8.2* 8.4* 8.2* 8.1* 8.2*  MG 1.4* 1.9  --   --   --  1.7 1.6*   Liver Function Tests: Recent Labs  Lab 04/21/18 0219 04/23/18 0010 04/24/18 0351 04/25/18 0437 04/26/18 0432  AST 22 173* 55* 32 21  ALT 26 99* 66* 44 32  ALKPHOS 161* 574* 449* 347* 322*  BILITOT 0.9 3.3* 2.0* 1.7* 1.4*  PROT 4.7* 6.0* 5.5* 5.2* 5.3*  ALBUMIN 2.0* 2.6* 2.4* 2.3* 2.3*   Recent Labs  Lab 04/19/18 1900 04/23/18 0010  LIPASE 36 27   No results for input(s): AMMONIA in the last 168 hours. CBC: Recent Labs  Lab 04/19/18 1900 04/20/18 0240  04/22/18 0217 04/23/18 0010 04/24/18 0351 04/25/18 0437 04/26/18 0432  WBC 11.4* 10.8*   < > 5.9 6.0 6.3 5.2 4.3  NEUTROABS 10.5* 9.1*  --   --  4.8  --   --   --   HGB 10.1* 7.8*   < > 8.7* 10.6* 9.9* 9.2* 8.9*  HCT 32.4* 24.7*   < > 27.0* 33.1* 31.4* 28.6* 27.6*  MCV 103.8* 102.5*   < > 99.6 98.8 100.6* 99.0 99.6  PLT 165 106*   < > 104* 118* 107* 104* 95*   < > = values  in this interval not displayed.   Cardiac Enzymes: No results for input(s): CKTOTAL, CKMB, CKMBINDEX, TROPONINI in the last 168 hours. BNP: Invalid input(s): POCBNP CBG: No results for input(s): GLUCAP in the last 168 hours. D-Dimer No results for input(s): DDIMER in the last 72 hours. Hgb A1c No results for input(s): HGBA1C in the last 72 hours. Lipid Profile No results for input(s): CHOL, HDL, LDLCALC, TRIG, CHOLHDL, LDLDIRECT in the last 72 hours. Thyroid function studies No results for input(s): TSH, T4TOTAL, T3FREE, THYROIDAB in the last 72 hours.  Invalid input(s): FREET3 Anemia work up No results for input(s):  VITAMINB12, FOLATE, FERRITIN, TIBC, IRON, RETICCTPCT in the last 72 hours. Urinalysis    Component Value Date/Time   COLORURINE YELLOW 04/23/2018 0239   APPEARANCEUR CLEAR 04/23/2018 0239   LABSPEC 1.006 04/23/2018 0239   PHURINE 7.0 04/23/2018 0239   GLUCOSEU 150 (A) 04/23/2018 0239   HGBUR NEGATIVE 04/23/2018 0239   BILIRUBINUR NEGATIVE 04/23/2018 0239   KETONESUR NEGATIVE 04/23/2018 0239   PROTEINUR NEGATIVE 04/23/2018 0239   NITRITE NEGATIVE 04/23/2018 0239   LEUKOCYTESUR NEGATIVE 04/23/2018 0239   Sepsis Labs Invalid input(s): PROCALCITONIN,  WBC,  LACTICIDVEN Microbiology Recent Results (from the past 240 hour(s))  Culture, blood (routine x 2)     Status: None   Collection Time: 04/19/18  7:00 PM  Result Value Ref Range Status   Specimen Description BLOOD SITE NOT SPECIFIED  Final   Special Requests   Final    BOTTLES DRAWN AEROBIC AND ANAEROBIC Blood Culture adequate volume   Culture   Final    NO GROWTH 5 DAYS Performed at [facility], [place] [address], [place], [place] [address]    Report Status 04/25/2018 FINAL  Final  Culture, blood (routine x 2)     Status: None   Collection Time: 04/19/18  8:10 PM  Result Value Ref Range Status   Specimen Description BLOOD LEFT ARM  Final   Special Requests   Final    BOTTLES DRAWN AEROBIC AND ANAEROBIC Blood Culture results may not be optimal due to an inadequate volume of blood received in culture bottles   Culture   Final    NO GROWTH 5 DAYS Performed at [facility], [place] [address], [place], [place] [address]    Report Status 04/25/2018 FINAL  Final  Urine Culture     Status: None   Collection Time: 04/19/18  8:25 PM  Result Value Ref Range Status   Specimen Description URINE, CATHETERIZED  Final   Special Requests NONE  Final   Culture   Final    NO GROWTH Performed at [facility], [place] [address], [place], [place] [address]    Report Status 04/21/2018 FINAL  Final  Culture, sputum-assessment      Status: None   Collection Time: 04/20/18 11:24 AM  Result Value Ref Range Status   Specimen Description SPUTUM  Final   Special Requests NONE  Final   Sputum evaluation   Final    THIS SPECIMEN IS ACCEPTABLE FOR SPUTUM CULTURE Performed at [facility], [place] [address] [place], [place] [address]    Report Status 04/20/2018 FINAL  Final  Culture, respiratory     Status: None   Collection Time: 04/20/18 11:24 AM  Result Value Ref Range Status   Specimen Description SPUTUM  Final   Special Requests NONE Reflexed from [id number]  Final   Gram Stain   Final    RARE WBC  PRESENT, PREDOMINANTLY PMN FEW GRAM POSITIVE COCCI IN PAIRS IN CLUSTERS FEW SQUAMOUS EPITHELIAL CELLS PRESENT    Culture   Final    MODERATE Consistent with normal respiratory flora. Performed at [facility], [place] [address], [place], [place] [address]    Report Status 04/22/2018 FINAL  Final  Blood Culture (routine x 2)     Status: None (Preliminary result)   Collection Time: 04/23/18 12:43 AM  Result Value Ref Range Status   Specimen Description BLOOD RIGHT ANTECUBITAL  Final   Special Requests   Final    BOTTLES DRAWN AEROBIC AND ANAEROBIC Blood Culture adequate volume   Culture   Final    NO GROWTH 2 DAYS Performed at [facility], [place] [address], [place], [place] [address]    Report Status PENDING  Incomplete  Blood Culture (routine x 2)     Status: None (Preliminary result)   Collection Time: 04/23/18 12:48 AM  Result Value Ref Range Status   Specimen Description BLOOD RIGHT FOREARM  Final   Special Requests   Final    BOTTLES DRAWN AEROBIC AND ANAEROBIC Blood Culture results may not be optimal due to an inadequate volume of blood received in culture bottles   Culture   Final    NO GROWTH 2 DAYS Performed at [facility], [place] [address], [place], [place] [address]    Report Status PENDING  Incomplete  Urine culture     Status: None   Collection Time: 04/23/18  2:39 AM  Result Value  Ref Range Status   Specimen Description URINE, RANDOM  Final   Special Requests NONE  Final   Culture   Final    NO GROWTH Performed at [facility], [address] [place], [place] [address]    Report Status 04/24/2018 FINAL  Final     Patient was seen and examined on the day of discharge and was found to be in stable condition. Time coordinating discharge: 35 minutes including assessment and coordination of care, as well as examination of the patient.   SIGNED:  Dessa Phi, DO [facility] Pager [phone]  If 7PM-7AM, please contact night-coverage [email or web address] Password [email or web address] 04/26/2018, 1:31 PM

## 2018-04-26 NOTE — Progress Notes (Signed)
Patient being discharged today. Instructions gone over with patient and will review again when son arrives for pick up. All belongings accounted for, patient very excited to go home. Family will be transporting patient home in private vehicle.

## 2018-04-26 NOTE — Progress Notes (Signed)
Patient active w [facility] prior to admission. Notified [facility] of DC today.

## 2018-04-27 ENCOUNTER — Telehealth: Payer: Self-pay | Admitting: Internal Medicine

## 2018-04-27 ENCOUNTER — Telehealth: Payer: Self-pay | Admitting: *Deleted

## 2018-04-27 NOTE — Telephone Encounter (Signed)
Chris/[facility] called office requesting verbal orders for PT for twice a week for 3 weeks. Best cb # [phone]

## 2018-04-27 NOTE — Telephone Encounter (Signed)
Left message on voicemail for patient to call back. Need to complete "TCM" call.

## 2018-04-27 NOTE — Telephone Encounter (Signed)
Patient's daughter-in-law [name] called back stating that he has a hard time hearing on the telephone and she could answer the questions for him because she takes care of him. Transition Care Management Follow-up Telephone Call  Date discharged? 04/26/18  How have you been since you were released from the hospital? [name] stated that "he is doing great."   Do you understand why you were in the hospital? YES, [name] stated that he tells people why he was in the hospital   Do you understand the discharge instructions? YES and [name] is helping him   Where were you discharged to? HOME   Items Reviewed:  Medications reviewed: YES  Allergies reviewed: YES  Dietary changes reviewed: YES *     Referrals reviewed: YES   Functional Questionnaire:   Activities of Daily Living (ADLs):   He states they are independent in the following: [name] stated that he is very independent and call do everything for himself States they require assistance with the following: Nothing   Any transportation issues/concerns?: NO   Any patient concerns? No   Confirmed importance and date/time of follow-up visits scheduled YES    Provider Appointment booked with Dr. Silvio Pate 05/01/18 at 10:45  Confirmed with patient if condition begins to worsen call PCP or go to the ER.  Patient was given the office number and encouraged to call back with question or concerns.  : YES/ Confirmed with [name]

## 2018-04-27 NOTE — Telephone Encounter (Signed)
Verbal orders given to Chris 

## 2018-04-27 NOTE — Telephone Encounter (Signed)
That is fine 

## 2018-04-28 LAB — CULTURE, BLOOD (ROUTINE X 2)
Culture: NO GROWTH
Culture: NO GROWTH
Special Requests: ADEQUATE

## 2018-05-01 ENCOUNTER — Encounter: Payer: Self-pay | Admitting: Internal Medicine

## 2018-05-01 ENCOUNTER — Ambulatory Visit (INDEPENDENT_AMBULATORY_CARE_PROVIDER_SITE_OTHER): Payer: Medicare Other | Admitting: Internal Medicine

## 2018-05-01 VITALS — BP 104/78 | HR 75 | Temp 97.4°F | Ht 67.0 in | Wt 129.0 lb

## 2018-05-01 DIAGNOSIS — K831 Obstruction of bile duct: Secondary | ICD-10-CM

## 2018-05-01 DIAGNOSIS — M170 Bilateral primary osteoarthritis of knee: Secondary | ICD-10-CM | POA: Diagnosis not present

## 2018-05-01 NOTE — Assessment & Plan Note (Signed)
Labs are finally better The LFT spike with the fever suggests his relapse was related to this--but seems to be doing better now Reviewed hospital records and discharge summaries Pneumonia unlikely Just finished antibiotic and is doing better Will just observe  see back 1 month---recheck labs etc

## 2018-05-01 NOTE — Assessment & Plan Note (Signed)
Bad again Will inject knees next time if stabilizes

## 2018-05-01 NOTE — Progress Notes (Signed)
Subjective:    Patient ID: Jose [name], male    DOB: Oct 03, 1923, 84 y.o.   MRN: [id number]  HPI Here for hospital follow up--with son  Reviewed all of hospital records Diagnosed with pneumonia--but not clear cut Only had what he would consider "a bad cold" He had spiked a fever Complained about stomach---then bowel incontinence "rough shape" per son  In and out of hospital a couple of times since my last visit May have gotten dry--not drinking enough for a while  He is back in his home [facility] stays with him during the day, son at night  Feels pretty good Appetite coming back His sense of taste is improving  No cough or SOB now  Current Outpatient Medications on File Prior to Visit  Medication Sig Dispense Refill  . cefdinir (OMNICEF) 300 MG capsule Take 1 capsule (300 mg total) by mouth 2 (two) times daily for 5 days. 10 capsule 0  . feeding supplement, ENSURE ENLIVE, (ENSURE ENLIVE) LIQD Take 6 mLs by mouth 2 (two) times daily between meals. 60 Bottle 0  . ferrous sulfate 325 (65 FE) MG EC tablet Take 325 mg by mouth daily with breakfast.    . gabapentin (NEURONTIN) 600 MG tablet TAKE 1 TABLET (600 MG TOTAL) BY MOUTH 2 (TWO) TIMES DAILY. (Patient taking differently: Take 600 mg by mouth 2 (two) times daily. ) 180 tablet 3  . ketoconazole (NIZORAL) 2 % cream Apply 1 application topically 2 (two) times daily as needed for irritation. 60 g 1  . metoprolol succinate (TOPROL-XL) 25 MG 24 hr tablet Take 1 tablet (25 mg total) by mouth daily. 30 tablet 0  . Multiple Vitamin (MULTIVITAMIN WITH MINERALS) TABS tablet Take 1 tablet by mouth daily. 30 tablet 0  . pramipexole (MIRAPEX) 0.125 MG tablet Take 1 tablet (0.125 mg total) by mouth daily as needed (restless legs). 30 tablet 0  . tamsulosin (FLOMAX) 0.4 MG CAPS capsule TAKE 1 CAPSULE (0.4 MG TOTAL) BY MOUTH DAILY. (Patient taking differently: Take 0.4 mg by mouth daily. ) 90 capsule 3  . triamcinolone cream (KENALOG) 0.1 %  Apply 1 application topically 2 (two) times daily as needed. (Patient taking differently: Apply 1 application topically 2 (two) times daily as needed (irritation). ) 30 g 1  . vitamin B-12 (CYANOCOBALAMIN) 500 MCG tablet Take 500 mcg by mouth daily.     No current facility-administered medications on file prior to visit.     No Known Allergies  Past Medical History:  Diagnosis Date  . Anxiety   . Back problem 1960  . BPH (benign prostatic hypertrophy)   . Cervical radiculopathy    with neuropathy  . Dyspnea   . GERD (gastroesophageal reflux disease)   . Hypertension   . Hypokalemia   . Melanoma in situ ([facility]) 12/2016   neck    Past Surgical History:  Procedure Laterality Date  . BILIARY STENT PLACEMENT  03/30/2018   Procedure: BILIARY STENT PLACEMENT;  Surgeon: Clarene Essex, MD;  Location: [place];  Service: Endoscopy;;  . CATARACT EXTRACTION     OD  . CHOLECYSTECTOMY N/A 01/26/2018   Procedure: LAPAROSCOPIC CHOLECYSTECTOMY;  Surgeon: Coralie Keens, MD;  Location: [place];  Service: General;  Laterality: N/A;  . ENDOSCOPIC RETROGRADE CHOLANGIOPANCREATOGRAPHY (ERCP) WITH PROPOFOL N/A 03/13/2018   Procedure: ENDOSCOPIC RETROGRADE CHOLANGIOPANCREATOGRAPHY (ERCP) WITH PROPOFOL;  Surgeon: Lucilla Lame, MD;  Location: [facility] ENDOSCOPY;  Service: Endoscopy;  Laterality: N/A;  . ERCP N/A 03/29/2018   Procedure: ENDOSCOPIC RETROGRADE  CHOLANGIOPANCREATOGRAPHY (ERCP);  Surgeon: Ronnette Juniper, MD;  Location: [place];  Service: Gastroenterology;  Laterality: N/A;  . ERCP N/A 03/30/2018   Procedure: ENDOSCOPIC RETROGRADE CHOLANGIOPANCREATOGRAPHY (ERCP);  Surgeon: Clarene Essex, MD;  Location: [place];  Service: Endoscopy;  Laterality: N/A;  . IR PERC CHOLECYSTOSTOMY  12/28/2017  . LAPAROSCOPIC CHOLECYSTECTOMY  01/26/2018  . MELANOMA EXCISION  12/2016   in situ---Dr Dasher  . SPHINCTEROTOMY  03/30/2018   Procedure: SPHINCTEROTOMY;  Surgeon: Clarene Essex, MD;  Location[facility] ENDOSCOPY;   Service: Endoscopy;;  . STENT REMOVAL  03/29/2018   Procedure: STENT REMOVAL;  Surgeon: Ronnette Juniper, MD;  Location: [facility] ENDOSCOPY;  Service: Gastroenterology;;  . TONSILLECTOMY      Family History  Problem Relation Age of Onset  . Cancer Son   . Coronary artery disease Neg Hx   . Diabetes Neg Hx     Social History   Socioeconomic History  . Marital status: Widowed    Spouse name: Not on file  . Number of children: 2  . Years of education: Not on file  . Highest education level: Not on file  Occupational History  . Occupation: retiredOccupational psychologist [employer]    Employer: RETIRED  Social Needs  . Financial resource strain: Not on file  . Food insecurity:    Worry: Not on file    Inability: Not on file  . Transportation needs:    Medical: Not on file    Non-medical: Not on file  Tobacco Use  . Smoking status: Never Smoker  . Smokeless tobacco: Never Used  Substance and Sexual Activity  . Alcohol use: No  . Drug use: No  . Sexual activity: Not on file  Lifestyle  . Physical activity:    Days per week: Not on file    Minutes per session: Not on file  . Stress: Not on file  Relationships  . Social connections:    Talks on phone: Patient refused    Gets together: Patient refused    Attends religious service: Patient refused    Active member of club or organization: Patient refused    Attends meetings of clubs or organizations: Patient refused    Relationship status: Patient refused  . Intimate partner violence:    Fear of current or ex partner: Patient refused    Emotionally abused: Patient refused    Physically abused: Patient refused    Forced sexual activity: Patient refused  Other Topics Concern  . Not on file  Social History Narrative   Has living will   Son Mikeal Hawthorne is health care POA.     Requests DNR--done 05/07/16   No feeding tube if cognitively unaware   Review of Systems Ongoing problems with knees Weight has rebounded some Hospital gave mirapex for  RLS--no clear help. Asked him to stop    Objective:   Physical Exam  HENT:  Cerumen in ears  Neck: No thyromegaly present.  Cardiovascular: Normal rate, regular rhythm and normal heart sounds. Exam reveals no gallop.  No murmur heard. Respiratory: Effort normal and breath sounds normal. No respiratory distress. He has no wheezes. He has no rales.  GI: Soft. Bowel sounds are normal. He exhibits no distension. There is no abdominal tenderness. There is no rebound and no guarding.  Musculoskeletal:        General: No edema.  Lymphadenopathy:    He has no cervical adenopathy.           Assessment & Plan:

## 2018-05-15 ENCOUNTER — Encounter: Payer: Medicare Other | Admitting: Internal Medicine

## 2018-05-22 ENCOUNTER — Telehealth: Payer: Self-pay

## 2018-05-22 NOTE — Telephone Encounter (Signed)
Patients daughter calls back in and sets up appt to see Dr. Silvio Pate on Monday.

## 2018-05-22 NOTE — Telephone Encounter (Signed)
Patients daughter in law calls to schedule an appointment with Dr. Silvio Pate for him due to "chest pain".  Call transferred to this writer for triage:  Patient c/o of burning across upper chest from clavicle to clavicle. Daughter in law thinks indigestion related and states he is very stable without any urgent/emergent symptoms.   Pulse 80  He denies any shortness of breath except for 1 brief episode when he woke up 2 days ago but none since.  Denies any s/s of chest cong, cough, fever, dizziness or diaphoresis.  He has been clear, A&O per his baseline without any other concerns.    Given recent frequent hospitalizations and med hx, reviewed with Dr. Silvio Pate who orders for patient to take omeprazole 20mg  daily OTC and set up for next week OV for follow up.    LM on Kathy's VM with information and to call back to set him up an appointment.

## 2018-05-25 ENCOUNTER — Ambulatory Visit: Payer: Medicare Other | Admitting: Internal Medicine

## 2018-05-28 ENCOUNTER — Other Ambulatory Visit: Payer: Self-pay | Admitting: Internal Medicine

## 2018-06-04 ENCOUNTER — Ambulatory Visit (INDEPENDENT_AMBULATORY_CARE_PROVIDER_SITE_OTHER): Payer: Medicare Other | Admitting: Internal Medicine

## 2018-06-04 ENCOUNTER — Encounter: Payer: Self-pay | Admitting: Internal Medicine

## 2018-06-04 VITALS — BP 120/70 | HR 73 | Temp 97.7°F | Ht 67.0 in | Wt 129.0 lb

## 2018-06-04 DIAGNOSIS — G629 Polyneuropathy, unspecified: Secondary | ICD-10-CM | POA: Diagnosis not present

## 2018-06-04 DIAGNOSIS — K831 Obstruction of bile duct: Secondary | ICD-10-CM | POA: Diagnosis not present

## 2018-06-04 DIAGNOSIS — M170 Bilateral primary osteoarthritis of knee: Secondary | ICD-10-CM

## 2018-06-04 MED ORDER — GABAPENTIN 600 MG PO TABS: 600.0000 mg | ORAL_TABLET | ORAL | 3 refills | Status: DC

## 2018-06-04 NOTE — Assessment & Plan Note (Signed)
Seems resolved Will check labs Due to have stent removed by GI

## 2018-06-04 NOTE — Assessment & Plan Note (Signed)
And RLS Will increase the evening gabapentin

## 2018-06-04 NOTE — Progress Notes (Signed)
Subjective:    Patient ID: Jose [name], male    DOB: January 18, 1924, 85 y.o.   MRN: [id number]  HPI Here for follow up of prolonged gallbladder problems and obstructive jaundice With grandson  Feels a little bit better Eating fairly well Hasn't gained back any weight Has ensure--tries to have some every day  No abdominal pain Some indigestion-- got something that helped No N/V Bowels are okay---lots of gas No blood   Current Outpatient Medications on File Prior to Visit  Medication Sig Dispense Refill  . feeding supplement, ENSURE ENLIVE, (ENSURE ENLIVE) LIQD Take 6 mLs by mouth 2 (two) times daily between meals. 60 Bottle 0  . ferrous sulfate 325 (65 FE) MG EC tablet Take 325 mg by mouth daily with breakfast.    . gabapentin (NEURONTIN) 600 MG tablet TAKE 1 TABLET (600 MG TOTAL) BY MOUTH 2 (TWO) TIMES DAILY. (Patient taking differently: Take 600 mg by mouth 2 (two) times daily. ) 180 tablet 3  . ketoconazole (NIZORAL) 2 % cream Apply 1 application topically 2 (two) times daily as needed for irritation. 60 g 1  . metoprolol succinate (TOPROL-XL) 25 MG 24 hr tablet TAKE ONE TABLET BY MOUTH DAILY 30 tablet 11  . Multiple Vitamin (MULTIVITAMIN WITH MINERALS) TABS tablet Take 1 tablet by mouth daily. 30 tablet 0  . tamsulosin (FLOMAX) 0.4 MG CAPS capsule TAKE 1 CAPSULE (0.4 MG TOTAL) BY MOUTH DAILY. (Patient taking differently: Take 0.4 mg by mouth daily. ) 90 capsule 3  . triamcinolone cream (KENALOG) 0.1 % Apply 1 application topically 2 (two) times daily as needed. (Patient taking differently: Apply 1 application topically 2 (two) times daily as needed (irritation). ) 30 g 1  . vitamin B-12 (CYANOCOBALAMIN) 500 MCG tablet Take 500 mcg by mouth daily.     No current facility-administered medications on file prior to visit.     No Known Allergies  Past Medical History:  Diagnosis Date  . Anxiety   . Back problem 1960  . BPH (benign prostatic hypertrophy)   . Cervical  radiculopathy    with neuropathy  . Dyspnea   . GERD (gastroesophageal reflux disease)   . Hypertension   . Hypokalemia   . Melanoma in situ ([place]) 12/2016   neck    Past Surgical History:  Procedure Laterality Date  . BILIARY STENT PLACEMENT  03/30/2018   Procedure: BILIARY STENT PLACEMENT;  Surgeon: Clarene Essex, MD;  Location: [place];  Service: Endoscopy;;  . CATARACT EXTRACTION     OD  . CHOLECYSTECTOMY N/A 01/26/2018   Procedure: LAPAROSCOPIC CHOLECYSTECTOMY;  Surgeon: Coralie Keens, MD;  Location: [place];  Service: General;  Laterality: N/A;  . ENDOSCOPIC RETROGRADE CHOLANGIOPANCREATOGRAPHY (ERCP) WITH PROPOFOL N/A 03/13/2018   Procedure: ENDOSCOPIC RETROGRADE CHOLANGIOPANCREATOGRAPHY (ERCP) WITH PROPOFOL;  Surgeon: Lucilla Lame, MD;  Location: [facility] ENDOSCOPY;  Service: Endoscopy;  Laterality: N/A;  . ERCP N/A 03/29/2018   Procedure: ENDOSCOPIC RETROGRADE CHOLANGIOPANCREATOGRAPHY (ERCP);  Surgeon: Ronnette Juniper, MD;  Location: [place];  Service: Gastroenterology;  Laterality: N/A;  . ERCP N/A 03/30/2018   Procedure: ENDOSCOPIC RETROGRADE CHOLANGIOPANCREATOGRAPHY (ERCP);  Surgeon: Clarene Essex, MD;  Location: [place];  Service: Endoscopy;  Laterality: N/A;  . IR PERC CHOLECYSTOSTOMY  12/28/2017  . LAPAROSCOPIC CHOLECYSTECTOMY  01/26/2018  . MELANOMA EXCISION  12/2016   in situ---Dr Dasher  . SPHINCTEROTOMY  03/30/2018   Procedure: SPHINCTEROTOMY;  Surgeon: Clarene Essex, MD;  Location[facility] ENDOSCOPY;  Service: Endoscopy;;  . STENT REMOVAL  03/29/2018  Procedure: STENT REMOVAL;  Surgeon: Ronnette Juniper, MD;  Location: [facility] ENDOSCOPY;  Service: Gastroenterology;;  . TONSILLECTOMY      Family History  Problem Relation Age of Onset  . Cancer Son   . Coronary artery disease Neg Hx   . Diabetes Neg Hx     Social History   Socioeconomic History  . Marital status: Widowed    Spouse name: Not on file  . Number of children: 2  . Years of education: Not on file  .  Highest education level: Not on file  Occupational History  . Occupation: retiredOccupational psychologist [employer]    Employer: RETIRED  Social Needs  . Financial resource strain: Not on file  . Food insecurity:    Worry: Not on file    Inability: Not on file  . Transportation needs:    Medical: Not on file    Non-medical: Not on file  Tobacco Use  . Smoking status: Never Smoker  . Smokeless tobacco: Never Used  Substance and Sexual Activity  . Alcohol use: No  . Drug use: No  . Sexual activity: Not on file  Lifestyle  . Physical activity:    Days per week: Not on file    Minutes per session: Not on file  . Stress: Not on file  Relationships  . Social connections:    Talks on phone: Patient refused    Gets together: Patient refused    Attends religious service: Patient refused    Active member of club or organization: Patient refused    Attends meetings of clubs or organizations: Patient refused    Relationship status: Patient refused  . Intimate partner violence:    Fear of current or ex partner: Patient refused    Emotionally abused: Patient refused    Physically abused: Patient refused    Forced sexual activity: Patient refused  Other Topics Concern  . Not on file  Social History Narrative   Has living will   Son Mikeal Hawthorne is health care POA.     Requests DNR--done 05/07/16   No feeding tube if cognitively unaware   Review of Systems Thinking about going back on tractor--begged him not to Okay for riding mower though Still has sleep problems---RLS Voids okay Hoping to get cortisone shots in his knees    Objective:   Physical Exam  Constitutional: He appears well-developed. No distress.  Neck: No thyromegaly present.  Cardiovascular: Normal rate, regular rhythm and normal heart sounds. Exam reveals no gallop.  No murmur heard. Respiratory: Effort normal and breath sounds normal. No respiratory distress. He has no wheezes. He has no rales.  GI: Soft. There is no abdominal  tenderness.  Lymphadenopathy:    He has no cervical adenopathy.  Psychiatric: He has a normal mood and affect. His behavior is normal.           Assessment & Plan:

## 2018-06-04 NOTE — Patient Instructions (Signed)
Please increase the gabapentin to 1 capsule in the morning and 2 at bedtime.

## 2018-06-04 NOTE — Assessment & Plan Note (Signed)
Will set up for knee injections

## 2018-06-05 LAB — COMPREHENSIVE METABOLIC PANEL
ALT: 24 U/L (ref 0–53)
AST: 22 U/L (ref 0–37)
Albumin: 3.5 g/dL (ref 3.5–5.2)
Alkaline Phosphatase: 149 U/L — ABNORMAL HIGH (ref 39–117)
BUN: 10 mg/dL (ref 6–23)
CO2: 26 mEq/L (ref 19–32)
Calcium: 8.7 mg/dL (ref 8.4–10.5)
Chloride: 107 mEq/L (ref 96–112)
Creatinine, Ser: 0.88 mg/dL (ref 0.40–1.50)
GFR: 80.9 mL/min (ref >60.00)
Glucose, Bld: 109 mg/dL — ABNORMAL HIGH (ref 70–99)
Potassium: 4.5 mEq/L (ref 3.5–5.1)
Sodium: 139 mEq/L (ref 135–145)
Total Bilirubin: 0.6 mg/dL (ref 0.2–1.2)
Total Protein: 6.1 g/dL (ref 6.0–8.3)

## 2018-06-05 LAB — CBC
HCT: 31.7 % — ABNORMAL LOW (ref 39.0–52.0)
Hemoglobin: 10.9 g/dL — ABNORMAL LOW (ref 13.0–17.0)
MCHC: 34.3 g/dL (ref 30.0–36.0)
MCV: 99.5 fl (ref 78.0–100.0)
Platelets: 112 10*3/uL — ABNORMAL LOW (ref 150.0–400.0)
RBC: 3.18 Mil/uL — ABNORMAL LOW (ref 4.22–5.81)
RDW: 17.5 % — ABNORMAL HIGH (ref 11.5–15.5)
WBC: 4.6 10*3/uL (ref 4.0–10.5)

## 2018-06-12 ENCOUNTER — Other Ambulatory Visit: Payer: Self-pay | Admitting: Gastroenterology

## 2018-06-18 ENCOUNTER — Ambulatory Visit (INDEPENDENT_AMBULATORY_CARE_PROVIDER_SITE_OTHER): Payer: Medicare Other | Admitting: Internal Medicine

## 2018-06-18 ENCOUNTER — Encounter: Payer: Self-pay | Admitting: Internal Medicine

## 2018-06-18 VITALS — BP 110/72 | HR 79 | Temp 97.7°F | Ht 70.0 in | Wt 130.0 lb

## 2018-06-18 DIAGNOSIS — M170 Bilateral primary osteoarthritis of knee: Secondary | ICD-10-CM | POA: Diagnosis not present

## 2018-06-18 MED ORDER — METHYLPREDNISOLONE ACETATE 40 MG/ML IJ SUSP
80.0000 mg | Freq: Once | INTRAMUSCULAR | Status: AC
  Administered 2018-06-18: 80 mg via INTRA_ARTICULAR

## 2018-06-18 MED ORDER — PRAMIPEXOLE DIHYDROCHLORIDE 0.125 MG PO TABS: 0.1250 mg | ORAL_TABLET | Freq: Every evening | ORAL | 5 refills | Status: AC | PRN

## 2018-06-18 NOTE — Assessment & Plan Note (Signed)
Both knees bad PROCEDURE Verbal consent Sterile prep anteromedial approach bilaterally 2cc 1% lidocaine then ethyl chloride  Joint entered easily on each side 5cc 1% lidocaine and 40mg  depomedrol instilled each side Tolerated well with immediate improvement in pain Discussed home care

## 2018-06-18 NOTE — Progress Notes (Signed)
Subjective:    Patient ID: Jose [name], male    DOB: [birth year], 85 y.o.   MRN: [id number]  HPI Here for injections in both knees I didn't have time last week Also has some separate concerns  It has been many months since his last injections He generally gets extended relief from the cortisone injecitions  Concerned about edema in feet/calves Fairly stable  Restless legs has been ongoing Worse in hospital---pramipexole helped Current Outpatient Medications on File Prior to Visit  Medication Sig Dispense Refill  . feeding supplement, ENSURE ENLIVE, (ENSURE ENLIVE) LIQD Take 6 mLs by mouth 2 (two) times daily between meals. 60 Bottle 0  . ferrous sulfate 325 (65 FE) MG EC tablet Take 325 mg by mouth daily with breakfast.    . gabapentin (NEURONTIN) 600 MG tablet Take 1 tablet (600 mg total) by mouth every morning. And 2 at bedtime 180 tablet 3  . ketoconazole (NIZORAL) 2 % cream Apply 1 application topically 2 (two) times daily as needed for irritation. 60 g 1  . metoprolol succinate (TOPROL-XL) 25 MG 24 hr tablet TAKE ONE TABLET BY MOUTH DAILY 30 tablet 11  . Multiple Vitamin (MULTIVITAMIN WITH MINERALS) TABS tablet Take 1 tablet by mouth daily. 30 tablet 0  . pramipexole (MIRAPEX) 0.125 MG tablet Take 0.125 mg by mouth at bedtime as needed.    . tamsulosin (FLOMAX) 0.4 MG CAPS capsule TAKE 1 CAPSULE (0.4 MG TOTAL) BY MOUTH DAILY. (Patient taking differently: Take 0.4 mg by mouth daily. ) 90 capsule 3  . triamcinolone cream (KENALOG) 0.1 % Apply 1 application topically 2 (two) times daily as needed. (Patient taking differently: Apply 1 application topically 2 (two) times daily as needed (irritation). ) 30 g 1  . vitamin B-12 (CYANOCOBALAMIN) 500 MCG tablet Take 500 mcg by mouth daily.     No current facility-administered medications on file prior to visit.     No Known Allergies  Past Medical History:  Diagnosis Date  . Anxiety   . Back problem 1960  . BPH (benign  prostatic hypertrophy)   . Cervical radiculopathy    with neuropathy  . Dyspnea   . GERD (gastroesophageal reflux disease)   . Hypertension   . Hypokalemia   . Melanoma in situ ([place]) 12/2016   neck    Past Surgical History:  Procedure Laterality Date  . BILIARY STENT PLACEMENT  03/30/2018   Procedure: BILIARY STENT PLACEMENT;  Surgeon: Clarene Essex, MD;  Location: [place];  Service: Endoscopy;;  . CATARACT EXTRACTION     OD  . CHOLECYSTECTOMY N/A 01/26/2018   Procedure: LAPAROSCOPIC CHOLECYSTECTOMY;  Surgeon: Coralie Keens, MD;  Location: [place];  Service: General;  Laterality: N/A;  . ENDOSCOPIC RETROGRADE CHOLANGIOPANCREATOGRAPHY (ERCP) WITH PROPOFOL N/A 03/13/2018   Procedure: ENDOSCOPIC RETROGRADE CHOLANGIOPANCREATOGRAPHY (ERCP) WITH PROPOFOL;  Surgeon: Lucilla Lame, MD;  Location: [facility] ENDOSCOPY;  Service: Endoscopy;  Laterality: N/A;  . ERCP N/A 03/29/2018   Procedure: ENDOSCOPIC RETROGRADE CHOLANGIOPANCREATOGRAPHY (ERCP);  Surgeon: Ronnette Juniper, MD;  Location: [place];  Service: Gastroenterology;  Laterality: N/A;  . ERCP N/A 03/30/2018   Procedure: ENDOSCOPIC RETROGRADE CHOLANGIOPANCREATOGRAPHY (ERCP);  Surgeon: Clarene Essex, MD;  Location: [place];  Service: Endoscopy;  Laterality: N/A;  . IR PERC CHOLECYSTOSTOMY  12/28/2017  . LAPAROSCOPIC CHOLECYSTECTOMY  01/26/2018  . MELANOMA EXCISION  12/2016   in situ---Dr Dasher  . SPHINCTEROTOMY  03/30/2018   Procedure: SPHINCTEROTOMY;  Surgeon: Clarene Essex, MD;  Location: [place];  Service: Endoscopy;;  .  STENT REMOVAL  03/29/2018   Procedure: STENT REMOVAL;  Surgeon: Ronnette Juniper, MD;  Location: [facility] ENDOSCOPY;  Service: Gastroenterology;;  . TONSILLECTOMY      Family History  Problem Relation Age of Onset  . Cancer Son   . Coronary artery disease Neg Hx   . Diabetes Neg Hx     Social History   Socioeconomic History  . Marital status: Widowed    Spouse name: Not on file  . Number of children: 2  .  Years of education: Not on file  . Highest education level: Not on file  Occupational History  . Occupation: retiredOccupational psychologist [employer]    Employer: RETIRED  Social Needs  . Financial resource strain: Not on file  . Food insecurity:    Worry: Not on file    Inability: Not on file  . Transportation needs:    Medical: Not on file    Non-medical: Not on file  Tobacco Use  . Smoking status: Never Smoker  . Smokeless tobacco: Never Used  Substance and Sexual Activity  . Alcohol use: No  . Drug use: No  . Sexual activity: Not on file  Lifestyle  . Physical activity:    Days per week: Not on file    Minutes per session: Not on file  . Stress: Not on file  Relationships  . Social connections:    Talks on phone: Patient refused    Gets together: Patient refused    Attends religious service: Patient refused    Active member of club or organization: Patient refused    Attends meetings of clubs or organizations: Patient refused    Relationship status: Patient refused  . Intimate partner violence:    Fear of current or ex partner: Patient refused    Emotionally abused: Patient refused    Physically abused: Patient refused    Forced sexual activity: Patient refused  Other Topics Concern  . Not on file  Social History Narrative   Has living will   Son Mikeal Hawthorne is health care POA.     Requests DNR--done 05/07/16   No feeding tube if cognitively unaware   Review of Systems Appetite is fine Weight stable    Objective:   Physical Exam  Musculoskeletal:     Comments: Minimal edema----just indentation from socks (I reassured him no action needed) Knees thickened but no effusions           Assessment & Plan:

## 2018-06-18 NOTE — Addendum Note (Signed)
Addended by: Pilar Grammes on: 06/18/2018 02:41 PM   Modules accepted: Orders

## 2018-06-24 ENCOUNTER — Telehealth: Payer: Self-pay | Admitting: *Deleted

## 2018-06-24 NOTE — Telephone Encounter (Addendum)
Spoke to pts son, Inocente Salles, who is requesting a call back to discuss pts condition. There is no DPR or POA on file to speak with him, but he insist on call back. Sam is also wanting to know what type of hearing aids Dr Silvio Pate wears, personally. pls advise

## 2018-06-25 ENCOUNTER — Other Ambulatory Visit: Payer: Self-pay | Admitting: Internal Medicine

## 2018-06-25 NOTE — Telephone Encounter (Signed)
There is a DPR signed under Media from 2016 along with the permanent comments section.

## 2018-06-25 NOTE — Telephone Encounter (Signed)
Spoke to Jose [name] per DPR. Told him the hearing aid and where Dr Silvio Pate got it.   He wanted to talk to Dr Silvio Pate about his Dad's mental changes. They will send a MyChart with the details in it.

## 2018-06-25 NOTE — Telephone Encounter (Signed)
I know that he has permission from patient to discuss his medical issues. Find out what he is asking about I have a [place] hearing aide

## 2018-08-07 ENCOUNTER — Other Ambulatory Visit: Payer: Self-pay | Admitting: Gastroenterology

## 2018-08-10 ENCOUNTER — Ambulatory Visit (HOSPITAL_COMMUNITY)
Admission: RE | Admit: 2018-08-10 | Discharge: 2018-08-10 | Disposition: A | Discharge status: Home / Self Care | Payer: Medicare Other | Attending: Gastroenterology | Admitting: Gastroenterology

## 2018-08-10 ENCOUNTER — Ambulatory Visit (HOSPITAL_COMMUNITY): Payer: Medicare Other | Admitting: Anesthesiology

## 2018-08-10 ENCOUNTER — Encounter (HOSPITAL_COMMUNITY)
Admission: RE | Disposition: A | Discharge status: Home / Self Care | Payer: Self-pay | Source: Home / Self Care | Attending: Gastroenterology

## 2018-08-10 ENCOUNTER — Other Ambulatory Visit: Payer: Self-pay

## 2018-08-10 ENCOUNTER — Encounter (HOSPITAL_COMMUNITY): Payer: Self-pay | Admitting: Anesthesiology

## 2018-08-10 ENCOUNTER — Telehealth: Payer: Self-pay | Admitting: Internal Medicine

## 2018-08-10 ENCOUNTER — Ambulatory Visit (HOSPITAL_COMMUNITY): Payer: Medicare Other

## 2018-08-10 DIAGNOSIS — D649 Anemia, unspecified: Secondary | ICD-10-CM | POA: Diagnosis not present

## 2018-08-10 DIAGNOSIS — K8050 Calculus of bile duct without cholangitis or cholecystitis without obstruction: Secondary | ICD-10-CM

## 2018-08-10 DIAGNOSIS — I10 Essential (primary) hypertension: Secondary | ICD-10-CM | POA: Insufficient documentation

## 2018-08-10 DIAGNOSIS — Z4689 Encounter for fitting and adjustment of other specified devices: Secondary | ICD-10-CM

## 2018-08-10 DIAGNOSIS — R748 Abnormal levels of other serum enzymes: Secondary | ICD-10-CM | POA: Insufficient documentation

## 2018-08-10 DIAGNOSIS — G629 Polyneuropathy, unspecified: Secondary | ICD-10-CM | POA: Insufficient documentation

## 2018-08-10 DIAGNOSIS — C249 Malignant neoplasm of biliary tract, unspecified: Secondary | ICD-10-CM | POA: Insufficient documentation

## 2018-08-10 DIAGNOSIS — K219 Gastro-esophageal reflux disease without esophagitis: Secondary | ICD-10-CM | POA: Insufficient documentation

## 2018-08-10 DIAGNOSIS — Z79899 Other long term (current) drug therapy: Secondary | ICD-10-CM | POA: Insufficient documentation

## 2018-08-10 HISTORY — PX: ERCP: SHX5425

## 2018-08-10 HISTORY — PX: STENT REMOVAL: SHX6421

## 2018-08-10 LAB — CBC
HCT: 27.8 % — ABNORMAL LOW (ref 39.0–52.0)
Hemoglobin: 8.9 g/dL — ABNORMAL LOW (ref 13.0–17.0)
MCH: 35.2 pg — ABNORMAL HIGH (ref 26.0–34.0)
MCHC: 32 g/dL (ref 30.0–36.0)
MCV: 109.9 fL — ABNORMAL HIGH (ref 80.0–100.0)
Platelets: 115 10*3/uL — ABNORMAL LOW (ref 150–400)
RBC: 2.53 MIL/uL — ABNORMAL LOW (ref 4.22–5.81)
RDW: 19.6 % — ABNORMAL HIGH (ref 11.5–15.5)
WBC: 5.1 10*3/uL (ref 4.0–10.5)
nRBC: 0 % (ref 0.0–0.2)

## 2018-08-10 LAB — COMPREHENSIVE METABOLIC PANEL
ALT: 77 U/L — ABNORMAL HIGH (ref 0–44)
AST: 48 U/L — ABNORMAL HIGH (ref 15–41)
Albumin: 2.8 g/dL — ABNORMAL LOW (ref 3.5–5.0)
Alkaline Phosphatase: 416 U/L — ABNORMAL HIGH (ref 38–126)
Anion gap: 8 (ref 5–15)
BUN: 8 mg/dL (ref 8–23)
CO2: 25 mmol/L (ref 22–32)
Calcium: 8.2 mg/dL — ABNORMAL LOW (ref 8.9–10.3)
Chloride: 103 mmol/L (ref 98–111)
Creatinine, Ser: 0.91 mg/dL (ref 0.61–1.24)
GFR calc Af Amer: >60 mL/min (ref >60)
GFR calc non Af Amer: >60 mL/min (ref >60)
Glucose, Bld: 104 mg/dL — ABNORMAL HIGH (ref 70–99)
POTASSIUM: 3.7 mmol/L (ref 3.5–5.1)
SODIUM: 136 mmol/L (ref 135–145)
Total Bilirubin: 3.3 mg/dL — ABNORMAL HIGH (ref 0.3–1.2)
Total Protein: 5.3 g/dL — ABNORMAL LOW (ref 6.5–8.1)

## 2018-08-10 SURGERY — ERCP, WITH INTERVENTION IF INDICATED
Anesthesia: General

## 2018-08-10 MED ORDER — PHENYLEPHRINE 40 MCG/ML (10ML) SYRINGE FOR IV PUSH (FOR BLOOD PRESSURE SUPPORT)
PREFILLED_SYRINGE | INTRAVENOUS | Status: DC | PRN
  Administered 2018-08-10 (×5): 80 ug via INTRAVENOUS

## 2018-08-10 MED ORDER — CIPROFLOXACIN IN D5W 400 MG/200ML IV SOLN
INTRAVENOUS | Status: DC | PRN
  Administered 2018-08-10: 400 mg via INTRAVENOUS

## 2018-08-10 MED ORDER — INDOMETHACIN 50 MG RE SUPP
RECTAL | Status: AC
  Filled 2018-08-10: qty 2

## 2018-08-10 MED ORDER — GLUCAGON HCL RDNA (DIAGNOSTIC) 1 MG IJ SOLR
INTRAMUSCULAR | Status: AC
  Filled 2018-08-10: qty 1

## 2018-08-10 MED ORDER — FENTANYL CITRATE (PF) 100 MCG/2ML IJ SOLN
INTRAMUSCULAR | Status: DC | PRN
  Administered 2018-08-10 (×4): 25 ug via INTRAVENOUS

## 2018-08-10 MED ORDER — PROPOFOL 10 MG/ML IV BOLUS
INTRAVENOUS | Status: DC | PRN
  Administered 2018-08-10: 60 mg via INTRAVENOUS

## 2018-08-10 MED ORDER — LIDOCAINE 2% (20 MG/ML) 5 ML SYRINGE
INTRAMUSCULAR | Status: DC | PRN
  Administered 2018-08-10: 100 mg via INTRAVENOUS

## 2018-08-10 MED ORDER — CIPROFLOXACIN IN D5W 400 MG/200ML IV SOLN
INTRAVENOUS | Status: AC
  Filled 2018-08-10: qty 200

## 2018-08-10 MED ORDER — SUCCINYLCHOLINE CHLORIDE 200 MG/10ML IV SOSY
PREFILLED_SYRINGE | INTRAVENOUS | Status: DC | PRN
  Administered 2018-08-10: 120 mg via INTRAVENOUS

## 2018-08-10 MED ORDER — ONDANSETRON HCL 4 MG/2ML IJ SOLN
INTRAMUSCULAR | Status: DC | PRN
  Administered 2018-08-10: 4 mg via INTRAVENOUS

## 2018-08-10 MED ORDER — LACTATED RINGERS IV SOLN
INTRAVENOUS | Status: DC | PRN
  Administered 2018-08-10: 09:00:00 via INTRAVENOUS

## 2018-08-10 MED ORDER — LACTATED RINGERS IV SOLN
INTRAVENOUS | Status: DC
  Administered 2018-08-10: 09:00:00 via INTRAVENOUS

## 2018-08-10 MED ORDER — SODIUM CHLORIDE 0.9 % IV SOLN: INTRAVENOUS | Status: DC

## 2018-08-10 MED ORDER — FENTANYL CITRATE (PF) 100 MCG/2ML IJ SOLN
INTRAMUSCULAR | Status: AC
  Filled 2018-08-10: qty 2

## 2018-08-10 MED ORDER — SUCCINYLCHOLINE CHLORIDE 200 MG/10ML IV SOSY
PREFILLED_SYRINGE | INTRAVENOUS | Status: AC
  Filled 2018-08-10: qty 20

## 2018-08-10 MED ORDER — SUGAMMADEX SODIUM 200 MG/2ML IV SOLN
INTRAVENOUS | Status: AC
  Filled 2018-08-10: qty 4

## 2018-08-10 MED ORDER — SODIUM CHLORIDE 0.9 % IV SOLN
INTRAVENOUS | Status: DC | PRN
  Administered 2018-08-10: 30 mL

## 2018-08-10 MED ORDER — ROCURONIUM BROMIDE 10 MG/ML (PF) SYRINGE
PREFILLED_SYRINGE | INTRAVENOUS | Status: AC
  Filled 2018-08-10: qty 20

## 2018-08-10 NOTE — Discharge Instructions (Signed)
YOU HAD AN ENDOSCOPIC PROCEDURE TODAY: Refer to the procedure report and other information in the discharge instructions given to you for any specific questions about what was found during the examination. If this information does not answer your questions, please call [facility] office at [phone] to clarify.   YOU SHOULD EXPECT: Some feelings of bloating in the abdomen. Passage of more gas than usual. Walking can help get rid of the air that was put into your GI tract during the procedure and reduce the bloating. If you had a lower endoscopy (such as a colonoscopy or flexible sigmoidoscopy) you may notice spotting of blood in your stool or on the toilet paper. Some abdominal soreness may be present for a day or two, also.  DIET: Your first meal following the procedure should be a light meal and then it is ok to progress to your normal diet. A half-sandwich or bowl of soup is an example of a good first meal. Heavy or fried foods are harder to digest and may make you feel nauseous or bloated. Drink plenty of fluids but you should avoid alcoholic beverages for 24 hours. If you had a esophageal dilation, please see attached instructions for diet.   ACTIVITY: Your care partner should take you home directly after the procedure. You should plan to take it easy, moving slowly for the rest of the day. You can resume normal activity the day after the procedure however YOU SHOULD NOT DRIVE, use power tools, machinery or perform tasks that involve climbing or major physical exertion for 24 hours (because of the sedation medicines used during the test).   SYMPTOMS TO REPORT IMMEDIATELY: A gastroenterologist can be reached at any hour. Please call [phone]  for any of the following symptoms:   Following lower endoscopy (colonoscopy, flexible sigmoidoscopy) Excessive amounts of blood in the stool  Significant tenderness, worsening of abdominal pains  Swelling of the abdomen that is new, acute  Fever of 100  or higher   Following upper endoscopy (EGD, EUS, ERCP, esophageal dilation) Vomiting of blood or coffee ground material  New, significant abdominal pain  New, significant chest pain or pain under the shoulder blades  Painful or persistently difficult swallowing  New shortness of breath  Black, tarry-looking or red, bloody stools  FOLLOW UP:  If any biopsies were taken you will be contacted by phone or by letter within the next 1-3 weeks. Call [phone]  if you have not heard about the biopsies in 3 weeks.  Please also call with any specific questions about appointments or follow up tests. Clear liquid diet for 6 hours then may slowly advance as tolerated and call if question or problem otherwise call in 1 week for pathology results and have primary doctor repeat liver tests in 2 weeks and fax me results and follow-up with me as needed

## 2018-08-10 NOTE — Anesthesia Postprocedure Evaluation (Signed)
Anesthesia Post Note  Patient: Jose [name]  Procedure(s) Performed: ENDOSCOPIC RETROGRADE CHOLANGIOPANCREATOGRAPHY (ERCP) (N/A )     Patient location during evaluation: PACU Anesthesia Type: General Level of consciousness: awake Pain management: pain level controlled Vital Signs Assessment: post-procedure vital signs reviewed and stable Cardiovascular status: stable Postop Assessment: no headache Anesthetic complications: no    Last Vitals:  Vitals:   08/10/18 1120 08/10/18 1122  BP:  (!) 152/97  Pulse: (!) 107 (!) 112  Resp: (!) 21 (!) 28  Temp:    SpO2: 97% 98%    Last Pain:  Vitals:   08/10/18 1122  TempSrc:   PainSc: 0-No pain                 Angelie Kram

## 2018-08-10 NOTE — Transfer of Care (Signed)
Immediate Anesthesia Transfer of Care Note  Patient: Jose [name]  Procedure(s) Performed: Procedure(s) with comments: ENDOSCOPIC RETROGRADE CHOLANGIOPANCREATOGRAPHY (ERCP) (N/A) - stent change or removal  Patient Location: PACU  Anesthesia Type:General  Level of Consciousness:  sedated, patient cooperative and responds to stimulation  Airway & Oxygen Therapy:Patient Spontanous Breathing and Patient connected to face mask oxgen  Post-op Assessment:  Report given to PACU RN and Post -op Vital signs reviewed and stable  Post vital signs:  Reviewed and stable  Last Vitals:  Vitals:   08/10/18 0858  BP: (!) 146/73  Pulse: 94  Resp: 20  Temp: 36.9 C  SpO2: 33%    Complications: No apparent anesthesia complications

## 2018-08-10 NOTE — Op Note (Addendum)
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 Estimated Blood Loss:     Estimated blood loss: none. Procedure:                Pre-Anesthesia Assessment:                           - Prior to the procedure, a History and Physical                            was performed, and patient medications and                            allergies were reviewed. The patient's tolerance of                            previous anesthesia was also reviewed. The risks                            and benefits of the procedure and the sedation                            options and risks were discussed with the patient.                            All questions were answered, and informed consent                            was obtained. Prior Anticoagulants: The patient has                            taken no previous anticoagulant or antiplatelet                            agents.  ASA Grade Assessment: III - A patient with                            severe systemic disease. After reviewing the risks                            and benefits, the patient was deemed in                            satisfactory condition to undergo the procedure.                           After obtaining informed consent, the scope was                            passed under direct vision.  Throughout the                            procedure, the patient's blood pressure, pulse, and                            oxygen saturations were monitored continuously. The                            TJF-Q180V ([id number] Olympus duodenoscope was                            introduced through the mouth, and used to inject                            contrast into and used to locate the major papilla.                            The ERCP was accomplished without difficulty. The                            patient tolerated the procedure well. Scope In: Scope Out: Findings:      One stent was removed from the biliary tree using a rat-toothed forceps       and sent for cytology. The stent was found to be partially occluded on       visual appearance. On repeat scope advancement after the stent was       recovered the previous sphincterotomy site was widely patent and deep       selective cannulation was readily obtained using the balloon catheter       loaded with the wire and the biliary tree was swept with a 12 mm balloon       starting at the bifurcation. All stones were removed. Nothing was found       at the end of the procedure with occlusion cholangiogram x2. And there       was adequate biliary drainage and there was no obvious stricture seen       and the 12 mm balloon passed readily through the duct without assistance       and there was  no pancreatic duct injection or wire advancement       throughout the procedure Impression:               - Choledocholithiasis was found. Complete removal                            was accomplished by balloon extraction.                           - One stent was removed from the biliary tree.                            There was no pancreatic duct injection or wire                            advancement throughout the procedure                           - The biliary tree was swept and nothing was found                            at the end of the  procedure and there was adequate                            biliary drainage and no obvious stricture remain. Moderate Sedation:      Not Applicable - Patient had care per Anesthesia. Recommendation:           - Clear liquid diet for 6 hours. Slowly advance                            diet as tolerated                           - Continue present medications.                           - Return to GI clinic PRN.                           - Telephone GI clinic for pathology results in 1                            week.                           - Telephone GI clinic if symptomatic PRN.                           - Check liver enzymes (AST, ALT, alkaline                            phosphatase, bilirubin) in 2 weeks. Probably at                            primary care doctor's office due  to convenience Procedure Code(s):        --- Professional ---                           [phone], Esophagogastroduodenoscopy, flexible,                            transoral; with removal of foreign body(s) Diagnosis Code(s):        --- Professional ---                           K80.50, Calculus of bile duct without cholangitis                            or cholecystitis without obstruction                           Z46.59, Encounter for fitting and adjustment of                            other gastrointestinal appliance and device                           R74.8, Abnormal levels of other serum enzymes CPT copyright 2018 American Medical Association. All rights reserved. The codes documented in this report are preliminary and upon coder review may  be revised to meet current compliance requirements. Clarene Essex, MD 08/10/2018 10:49:25 AM This report has been signed electronically. Number of Addenda: 0

## 2018-08-10 NOTE — Telephone Encounter (Signed)
[name] called to request 2 wk liver tests that Dr Watt Climes ordered. I advised her we can only do labs from [place] offices and suggested [facility] but she wants you to review the note and call back with what you suggest. [phone]

## 2018-08-10 NOTE — Anesthesia Preprocedure Evaluation (Addendum)
Anesthesia Evaluation  Patient identified by MRN, date of birth, ID band Patient awake    Reviewed: Allergy & Precautions, NPO status , Patient's Chart, lab work & pertinent test results  Airway Mallampati: II  TM Distance: >3 FB     Dental   Pulmonary shortness of breath,    breath sounds clear to auscultation       Cardiovascular hypertension,  Rhythm:Regular Rate:Normal     Neuro/Psych    GI/Hepatic Neg liver ROS, GERD  ,  Endo/Other  negative endocrine ROS  Renal/GU negative Renal ROS     Musculoskeletal   Abdominal   Peds  Hematology  (+) anemia ,   Anesthesia Other Findings   Reproductive/Obstetrics                             Anesthesia Physical Anesthesia Plan  ASA: III  Anesthesia Plan: General   Post-op Pain Management:    Induction: Intravenous  PONV Risk Score and Plan: 2 and Ondansetron, Dexamethasone and Midazolam  Airway Management Planned: LMA  Additional Equipment:   Intra-op Plan:   Post-operative Plan:   Informed Consent: I have reviewed the patients History and Physical, chart, labs and discussed the procedure including the risks, benefits and alternatives for the proposed anesthesia with the patient or authorized representative who has indicated his/her understanding and acceptance.     Dental advisory given  Plan Discussed with: CRNA and Anesthesiologist  Anesthesia Plan Comments:         Anesthesia Quick Evaluation

## 2018-08-10 NOTE — Progress Notes (Signed)
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

## 2018-08-10 NOTE — Anesthesia Procedure Notes (Signed)
Procedure Name: Intubation Date/Time: 08/10/2018 10:13 AM Performed by: Lavina Hamman, CRNA Pre-anesthesia Checklist: Patient identified, Emergency Drugs available, Suction available, Patient being monitored and Timeout performed Patient Re-evaluated:Patient Re-evaluated prior to induction Oxygen Delivery Method: Circle system utilized Preoxygenation: Pre-oxygenation with 100% oxygen Induction Type: IV induction, Rapid sequence and Cricoid Pressure applied Ventilation: Mask ventilation without difficulty Laryngoscope Size: Mac and 4 Grade View: Grade II Tube type: Oral Tube size: 7.5 mm Number of attempts: 1 Airway Equipment and Method: Stylet Placement Confirmation: ETT inserted through vocal cords under direct vision,  positive ETCO2,  CO2 detector and breath sounds checked- equal and bilateral Secured at: 22 cm Tube secured with: Tape Dental Injury: Teeth and Oropharynx as per pre-operative assessment  Comments: ATOI.  RSI due to risk of Covid 19 transmission.  Pt asymptomatic and wearing N95 mask in preop.

## 2018-08-11 NOTE — Telephone Encounter (Signed)
Spoke to [place]. Made appt 08-26-18 at 11 for labs.

## 2018-08-11 NOTE — Addendum Note (Signed)
Addended by: Viviana Simpler I on: 08/11/2018 08:08 AM   Modules accepted: Orders

## 2018-08-11 NOTE — Telephone Encounter (Signed)
I ordered the study here for future Please set him up in 2 weeks for phlebotomy and I will forward it to Dr Watt Climes

## 2018-08-12 ENCOUNTER — Other Ambulatory Visit: Payer: Self-pay | Admitting: Gastroenterology

## 2018-08-12 ENCOUNTER — Other Ambulatory Visit: Payer: Self-pay | Admitting: Internal Medicine

## 2018-08-12 DIAGNOSIS — C249 Malignant neoplasm of biliary tract, unspecified: Secondary | ICD-10-CM

## 2018-08-12 DIAGNOSIS — C259 Malignant neoplasm of pancreas, unspecified: Secondary | ICD-10-CM

## 2018-08-13 ENCOUNTER — Other Ambulatory Visit: Payer: Medicare Other

## 2018-08-18 ENCOUNTER — Ambulatory Visit
Admission: RE | Admit: 2018-08-18 | Discharge: 2018-08-18 | Disposition: A | Discharge status: Home / Self Care | Payer: Medicare Other | Source: Ambulatory Visit | Attending: Gastroenterology | Admitting: Gastroenterology

## 2018-08-18 ENCOUNTER — Other Ambulatory Visit: Payer: Self-pay

## 2018-08-18 DIAGNOSIS — C259 Malignant neoplasm of pancreas, unspecified: Secondary | ICD-10-CM

## 2018-08-18 MED ORDER — IOPAMIDOL (ISOVUE-300) INJECTION 61%
100.0000 mL | Freq: Once | INTRAVENOUS | Status: AC | PRN
  Administered 2018-08-18: 100 mL via INTRAVENOUS

## 2018-08-21 ENCOUNTER — Telehealth: Payer: Self-pay | Admitting: Internal Medicine

## 2018-08-21 NOTE — Telephone Encounter (Signed)
Best number [phone] [name] (daughter in law) called to get ct results from 3/31

## 2018-08-22 NOTE — Telephone Encounter (Signed)
Phone call with DIL Discussed the findings of apparent pancreatic cancer He continues to feel good and eat well----actually has his grandson helping him put in his garden.  Shannon---please call her to schedule video visit with him Monday afternoon or Tuesday morning to discuss this  Will send note to Dr Magod---not sure how they hadn't heard Will need stent put back in permanently for palliation Not sure we need to recheck his LFTs as scheduled on Wednesday---since he needs stent replaced regardless. Will await Dr Perley Jain advice about this

## 2018-08-24 ENCOUNTER — Ambulatory Visit (INDEPENDENT_AMBULATORY_CARE_PROVIDER_SITE_OTHER): Payer: Medicare Other | Admitting: Internal Medicine

## 2018-08-24 ENCOUNTER — Encounter: Payer: Self-pay | Admitting: Internal Medicine

## 2018-08-24 ENCOUNTER — Other Ambulatory Visit: Payer: Self-pay | Admitting: Gastroenterology

## 2018-08-24 ENCOUNTER — Other Ambulatory Visit: Payer: Self-pay

## 2018-08-24 DIAGNOSIS — C259 Malignant neoplasm of pancreas, unspecified: Secondary | ICD-10-CM | POA: Diagnosis not present

## 2018-08-24 NOTE — Assessment & Plan Note (Signed)
Reviewed all his scans and he had mostly inflammatory changes, somewhat less indistinct in December---but then clear and much bigger now He asked about surgery---I told him that wasn't an option since it was already in the bile duct. (and may be in his lung---but I didn't discuss that at this visit) He is interested in seeing if chemotherapy is an option--will set up urgent oncology visit. Despite his age, he has good functional status now (but I would hate to ruin the end of his life if the chemo is not easy to tolerate) Will need the biliary stent replaced to prevent blockage again----DIL did speak to Dr Watt Climes this morning and he is going to get this set up soon

## 2018-08-24 NOTE — Progress Notes (Signed)
Subjective:    Patient ID: Jose [name], male    DOB: May 09, 1924, 85 y.o.   MRN: [id number]  HPI Video visit with DIL [name], son and patient to review last week's CT scan results Identification done and reviewed billing  Has been eating Hasn't lost any weight  He is putting in his garden (with help)  Current Outpatient Medications on File Prior to Visit  Medication Sig Dispense Refill   feeding supplement, ENSURE ENLIVE, (ENSURE ENLIVE) LIQD Take 6 mLs by mouth 2 (two) times daily between meals. (Patient taking differently: Take 237 mLs by mouth daily. ) 60 Bottle 0   ferrous sulfate 325 (65 FE) MG EC tablet Take 325 mg by mouth daily with breakfast.     gabapentin (NEURONTIN) 600 MG tablet Take 1 tablet (600 mg total) by mouth every morning. And 2 at bedtime (Patient taking differently: Take 600 mg by mouth 2 (two) times daily. ) 180 tablet 3   metoprolol succinate (TOPROL-XL) 25 MG 24 hr tablet TAKE ONE TABLET BY MOUTH DAILY 30 tablet 11   Multiple Vitamin (MULTIVITAMIN WITH MINERALS) TABS tablet Take 1 tablet by mouth daily. 30 tablet 0   omeprazole (PRILOSEC) 20 MG capsule Take 20 mg by mouth every morning.     pramipexole (MIRAPEX) 0.125 MG tablet Take 1 tablet (0.125 mg total) by mouth at bedtime as needed. (Patient taking differently: Take 0.125 mg by mouth at bedtime as needed (restless legs). ) 30 tablet 5   Probiotic Product (ALIGN) 4 MG CAPS Take 4 mg by mouth daily.     tamsulosin (FLOMAX) 0.4 MG CAPS capsule Take 1 capsule (0.4 mg total) by mouth daily. (Patient taking differently: Take 0.4 mg by mouth 2 (two) times daily. ) 90 capsule 3   vitamin B-12 (CYANOCOBALAMIN) 500 MCG tablet Take 500 mcg by mouth daily.     No current facility-administered medications on file prior to visit.     No Known Allergies  Past Medical History:  Diagnosis Date   Anxiety    Back problem 1960   BPH (benign prostatic hypertrophy)    Cervical radiculopathy    with  neuropathy   Dyspnea    GERD (gastroesophageal reflux disease)    Hypertension    Hypokalemia    Melanoma in situ ([place]) 12/2016   neck    Past Surgical History:  Procedure Laterality Date   BILIARY STENT PLACEMENT  03/30/2018   Procedure: BILIARY STENT PLACEMENT;  Surgeon: Clarene Essex, MD;  Location: [place];  Service: Endoscopy;;   CATARACT EXTRACTION     OD   CHOLECYSTECTOMY N/A 01/26/2018   Procedure: LAPAROSCOPIC CHOLECYSTECTOMY;  Surgeon: Coralie Keens, MD;  Location: [place];  Service: General;  Laterality: N/A;   ENDOSCOPIC RETROGRADE CHOLANGIOPANCREATOGRAPHY (ERCP) WITH PROPOFOL N/A 03/13/2018   Procedure: ENDOSCOPIC RETROGRADE CHOLANGIOPANCREATOGRAPHY (ERCP) WITH PROPOFOL;  Surgeon: Lucilla Lame, MD;  Location: [facility] ENDOSCOPY;  Service: Endoscopy;  Laterality: N/A;   ERCP N/A 03/29/2018   Procedure: ENDOSCOPIC RETROGRADE CHOLANGIOPANCREATOGRAPHY (ERCP);  Surgeon: Ronnette Juniper, MD;  Location: [place];  Service: Gastroenterology;  Laterality: N/A;   ERCP N/A 03/30/2018   Procedure: ENDOSCOPIC RETROGRADE CHOLANGIOPANCREATOGRAPHY (ERCP);  Surgeon: Clarene Essex, MD;  Location: [place];  Service: Endoscopy;  Laterality: N/A;   ERCP N/A 08/10/2018   Procedure: ENDOSCOPIC RETROGRADE CHOLANGIOPANCREATOGRAPHY (ERCP);  Surgeon: Clarene Essex, MD;  Location: [facility];  Service: Endoscopy;  Laterality: N/A;  Balloon sweep for stones   IR PERC CHOLECYSTOSTOMY  12/28/2017   LAPAROSCOPIC CHOLECYSTECTOMY  01/26/2018   MELANOMA EXCISION  12/2016   in situ---Dr Dasher   SPHINCTEROTOMY  03/30/2018   Procedure: Joan Mayans;  Surgeon: Clarene Essex, MD;  Location: [facility] ENDOSCOPY;  Service: Endoscopy;;   STENT REMOVAL  03/29/2018   Procedure: STENT REMOVAL;  Surgeon: Ronnette Juniper, MD;  Location: [place];  Service: Gastroenterology;;   Lavell Islam REMOVAL  08/10/2018   Procedure: STENT REMOVAL;  Surgeon: Clarene Essex, MD;  Location: [facility] ENDOSCOPY;  Service: Endoscopy;;    TONSILLECTOMY      Family History  Problem Relation Age of Onset   Cancer Son    Coronary artery disease Neg Hx    Diabetes Neg Hx     Social History   Socioeconomic History   Marital status: Widowed    Spouse name: Not on file   Number of children: 2   Years of education: Not on file   Highest education level: Not on file  Occupational History   Occupation: retired- Psychologist, prison and probation services: [place] resource strain: Not on file   Food insecurity:    Worry: Not on file    Inability: Not on file   Transportation needs:    Medical: Not on file    Non-medical: Not on file  Tobacco Use   Smoking status: Never Smoker   Smokeless tobacco: Never Used  Substance and Sexual Activity   Alcohol use: No   Drug use: No   Sexual activity: Not on file  Lifestyle   Physical activity:    Days per week: Not on file    Minutes per session: Not on file   Stress: Not on file  Relationships   Social connections:    Talks on phone: Patient refused    Gets together: Patient refused    Attends religious service: Patient refused    Active member of club or organization: Patient refused    Attends meetings of clubs or organizations: Patient refused    Relationship status: Patient refused   Intimate partner violence:    Fear of current or ex partner: Patient refused    Emotionally abused: Patient refused    Physically abused: Patient refused    Forced sexual activity: Patient refused  Other Topics Concern   Not on file  Social History Narrative   Has living will   Son Mikeal Hawthorne is health care POA.     Requests DNR--done 05/07/16   No feeding tube if cognitively unaware   Review of Systems No N/V Bowels are loose ----no incontinence (if careful)--has to rush if he gets the urge    Objective:   Physical Exam  Constitutional: No distress.  Appears well  Skin:  No jaundice           Assessment & Plan:

## 2018-08-24 NOTE — Telephone Encounter (Signed)
Appt scheduled today at 330.

## 2018-08-25 ENCOUNTER — Telehealth: Payer: Self-pay

## 2018-08-25 ENCOUNTER — Encounter (HOSPITAL_COMMUNITY): Payer: Self-pay | Admitting: Emergency Medicine

## 2018-08-25 ENCOUNTER — Other Ambulatory Visit: Payer: Self-pay

## 2018-08-25 ENCOUNTER — Telehealth: Payer: Self-pay | Admitting: Nurse Practitioner

## 2018-08-25 NOTE — Telephone Encounter (Signed)
A new patient appt has been scheduled for the pt to see Ned Card on 4/8 at 10am. Per staff msg, pt's daughter has been notified.

## 2018-08-25 NOTE — Telephone Encounter (Signed)
Called and spoke with patient's daughter-in-law, Brigg Cape,  to advise of initial consult with Dr. Benay Spice,  tomorrow morning, 08/26/18 at 10 AM with arrival 20 minutes prior.   Visitor instructions explained. [name] would like to be included by phone. I encouraged her to reach out to me with questions or concerns. She has my direct contact information.

## 2018-08-26 ENCOUNTER — Telehealth: Payer: Self-pay | Admitting: *Deleted

## 2018-08-26 ENCOUNTER — Other Ambulatory Visit: Payer: Self-pay | Admitting: Nurse Practitioner

## 2018-08-26 ENCOUNTER — Other Ambulatory Visit: Payer: Self-pay | Admitting: Gastroenterology

## 2018-08-26 ENCOUNTER — Encounter: Payer: Self-pay | Admitting: Nurse Practitioner

## 2018-08-26 ENCOUNTER — Inpatient Hospital Stay: Payer: Medicare Other

## 2018-08-26 ENCOUNTER — Inpatient Hospital Stay: Payer: Medicare Other | Attending: Nurse Practitioner | Admitting: Nurse Practitioner

## 2018-08-26 ENCOUNTER — Telehealth: Payer: Self-pay | Admitting: Nurse Practitioner

## 2018-08-26 ENCOUNTER — Other Ambulatory Visit: Payer: Self-pay

## 2018-08-26 ENCOUNTER — Other Ambulatory Visit: Payer: Medicare Other

## 2018-08-26 VITALS — BP 133/71 | HR 76 | Temp 98.1°F | Resp 17 | Ht 70.0 in | Wt 125.7 lb

## 2018-08-26 DIAGNOSIS — I10 Essential (primary) hypertension: Secondary | ICD-10-CM | POA: Diagnosis not present

## 2018-08-26 DIAGNOSIS — K810 Acute cholecystitis: Secondary | ICD-10-CM | POA: Diagnosis not present

## 2018-08-26 DIAGNOSIS — C259 Malignant neoplasm of pancreas, unspecified: Secondary | ICD-10-CM

## 2018-08-26 DIAGNOSIS — Z803 Family history of malignant neoplasm of breast: Secondary | ICD-10-CM

## 2018-08-26 DIAGNOSIS — D539 Nutritional anemia, unspecified: Secondary | ICD-10-CM | POA: Insufficient documentation

## 2018-08-26 DIAGNOSIS — Z9049 Acquired absence of other specified parts of digestive tract: Secondary | ICD-10-CM

## 2018-08-26 DIAGNOSIS — C250 Malignant neoplasm of head of pancreas: Secondary | ICD-10-CM | POA: Insufficient documentation

## 2018-08-26 DIAGNOSIS — R590 Localized enlarged lymph nodes: Secondary | ICD-10-CM

## 2018-08-26 DIAGNOSIS — Z801 Family history of malignant neoplasm of trachea, bronchus and lung: Secondary | ICD-10-CM

## 2018-08-26 DIAGNOSIS — Z79899 Other long term (current) drug therapy: Secondary | ICD-10-CM | POA: Insufficient documentation

## 2018-08-26 DIAGNOSIS — Z809 Family history of malignant neoplasm, unspecified: Secondary | ICD-10-CM

## 2018-08-26 DIAGNOSIS — Z86006 Personal history of melanoma in-situ: Secondary | ICD-10-CM

## 2018-08-26 DIAGNOSIS — N400 Enlarged prostate without lower urinary tract symptoms: Secondary | ICD-10-CM | POA: Insufficient documentation

## 2018-08-26 LAB — CBC WITH DIFFERENTIAL (CANCER CENTER ONLY)
Abs Immature Granulocytes: 0.01 10*3/uL (ref 0.00–0.07)
Basophils Absolute: 0 10*3/uL (ref 0.0–0.1)
Basophils Relative: 1 %
Eosinophils Absolute: 0.2 10*3/uL (ref 0.0–0.5)
Eosinophils Relative: 5 %
HCT: 29.6 % — ABNORMAL LOW (ref 39.0–52.0)
Hemoglobin: 9.8 g/dL — ABNORMAL LOW (ref 13.0–17.0)
Immature Granulocytes: 0 %
Lymphocytes Relative: 28 %
Lymphs Abs: 1 10*3/uL (ref 0.7–4.0)
MCH: 36 pg — ABNORMAL HIGH (ref 26.0–34.0)
MCHC: 33.1 g/dL (ref 30.0–36.0)
MCV: 108.8 fL — ABNORMAL HIGH (ref 80.0–100.0)
Monocytes Absolute: 0.5 10*3/uL (ref 0.1–1.0)
Monocytes Relative: 13 %
Neutro Abs: 1.8 10*3/uL (ref 1.7–7.7)
Neutrophils Relative %: 53 %
Platelet Count: 123 10*3/uL — ABNORMAL LOW (ref 150–400)
RBC: 2.72 MIL/uL — ABNORMAL LOW (ref 4.22–5.81)
RDW: 19.4 % — ABNORMAL HIGH (ref 11.5–15.5)
WBC Count: 3.5 10*3/uL — ABNORMAL LOW (ref 4.0–10.5)
nRBC: 0 % (ref 0.0–0.2)

## 2018-08-26 LAB — RETICULOCYTES
Immature Retic Fract: 12.4 % (ref 2.3–15.9)
RBC.: 2.76 MIL/uL — ABNORMAL LOW (ref 4.22–5.81)
Retic Count, Absolute: 48.6 10*3/uL (ref 19.0–186.0)
Retic Ct Pct: 1.8 % (ref 0.4–3.1)

## 2018-08-26 LAB — CMP (CANCER CENTER ONLY)
ALT: 21 U/L (ref 0–44)
AST: 24 U/L (ref 15–41)
Albumin: 2.9 g/dL — ABNORMAL LOW (ref 3.5–5.0)
Alkaline Phosphatase: 239 U/L — ABNORMAL HIGH (ref 38–126)
Anion gap: 7 (ref 5–15)
BUN: 12 mg/dL (ref 8–23)
CO2: 25 mmol/L (ref 22–32)
Calcium: 8 mg/dL — ABNORMAL LOW (ref 8.9–10.3)
Chloride: 110 mmol/L (ref 98–111)
Creatinine: 1.05 mg/dL (ref 0.61–1.24)
GFR, Est AFR Am: >60 mL/min (ref >60)
GFR, Estimated: >60 mL/min (ref >60)
Glucose, Bld: 109 mg/dL — ABNORMAL HIGH (ref 70–99)
Potassium: 3.8 mmol/L (ref 3.5–5.1)
Sodium: 142 mmol/L (ref 135–145)
Total Bilirubin: 0.8 mg/dL (ref 0.3–1.2)
Total Protein: 5.6 g/dL — ABNORMAL LOW (ref 6.5–8.1)

## 2018-08-26 NOTE — Telephone Encounter (Signed)
Scheduled appt per 4/8 los.  Patient aware of scheduled appt.

## 2018-08-26 NOTE — Progress Notes (Addendum)
New Hematology/Oncology Consult   Requesting MD: Dr. Silvio Pate  [phone]      Reason for Consult: Pancreas cancer  HPI: Jose [name] is a 85 year old man who was hospitalized in October and November 2019 with obstructive jaundice.  ERCP on 03/13/2018 showed a single segmental biliary stricture in the lower third of the main bile duct.  A plastic stent was placed into the common bile duct.  ERCP 03/30/2018 showed the major papilla to be edematous and erythematous.  Biliary tree was swept and pus was found.  Plastic stent was removed.  Metal stent was placed into the common bile duct.  Pathology on the removed stent was negative for malignant cells.  ERCP 08/10/2018 showed choledocholithiasis with complete removal by balloon extraction.  A stent was removed from the biliary tree.  The stent was partially occluded.  Biliary tree was swept with nothing found at the end of the procedure, there was adequate biliary drainage and no obvious stricture remained.  Pathology on the bile duct stent showed malignant cells present consistent with adenocarcinoma.  Most recent CT imaging 08/18/2018 showed an infiltrative mass in the head of the pancreas increased in volume; mass abutted the posterior half of the superior mesenteric artery; new 1.1 x 0.9 smooth pulmonary nodule right lower lobe; mild right infrahilar adenopathy; no definite liver lesions identified; biliary stent no longer present; pneumobilia noted.     Past Medical History:  Diagnosis Date  . Anxiety   . Back problem 1960  . BPH (benign prostatic hypertrophy)   . Cervical radiculopathy    with neuropathy  . Dyspnea   . GERD (gastroesophageal reflux disease)   . HOH (hard of hearing)   . Hypertension   . Hypokalemia   . Melanoma in situ ([place]) 12/2016   neck  . Pancreatic adenocarcinoma [facility])    per patient D-I-L [name] , dx after last ercp, first oncology appt on 08-26-2018 at [facility]  :   Past Surgical History:  Procedure  Laterality Date  . BILIARY STENT PLACEMENT  03/30/2018   Procedure: BILIARY STENT PLACEMENT;  Surgeon: Clarene Essex, MD;  Location: [place];  Service: Endoscopy;;  . CATARACT EXTRACTION     OD  . CHOLECYSTECTOMY N/A 01/26/2018   Procedure: LAPAROSCOPIC CHOLECYSTECTOMY;  Surgeon: Coralie Keens, MD;  Location: [place];  Service: General;  Laterality: N/A;  . ENDOSCOPIC RETROGRADE CHOLANGIOPANCREATOGRAPHY (ERCP) WITH PROPOFOL N/A 03/13/2018   Procedure: ENDOSCOPIC RETROGRADE CHOLANGIOPANCREATOGRAPHY (ERCP) WITH PROPOFOL;  Surgeon: Lucilla Lame, MD;  Location: [facility] ENDOSCOPY;  Service: Endoscopy;  Laterality: N/A;  . ERCP N/A 03/29/2018   Procedure: ENDOSCOPIC RETROGRADE CHOLANGIOPANCREATOGRAPHY (ERCP);  Surgeon: Ronnette Juniper, MD;  Location: [place];  Service: Gastroenterology;  Laterality: N/A;  . ERCP N/A 03/30/2018   Procedure: ENDOSCOPIC RETROGRADE CHOLANGIOPANCREATOGRAPHY (ERCP);  Surgeon: Clarene Essex, MD;  Location: [place];  Service: Endoscopy;  Laterality: N/A;  . ERCP N/A 08/10/2018   Procedure: ENDOSCOPIC RETROGRADE CHOLANGIOPANCREATOGRAPHY (ERCP);  Surgeon: Clarene Essex, MD;  Location: [facility];  Service: Endoscopy;  Laterality: N/A;  Balloon sweep for stones  . IR PERC CHOLECYSTOSTOMY  12/28/2017  . LAPAROSCOPIC CHOLECYSTECTOMY  01/26/2018  . MELANOMA EXCISION  12/2016   in situ---Dr Dasher  . SPHINCTEROTOMY  03/30/2018   Procedure: SPHINCTEROTOMY;  Surgeon: Clarene Essex, MD;  Location: [facility] ENDOSCOPY;  Service: Endoscopy;;  . STENT REMOVAL  03/29/2018   Procedure: STENT REMOVAL;  Surgeon: Ronnette Juniper, MD;  Location: [place];  Service: Gastroenterology;;  . Lavell Islam REMOVAL  08/10/2018  Procedure: STENT REMOVAL;  Surgeon: Clarene Essex, MD;  Location: [facility] ENDOSCOPY;  Service: Endoscopy;;  . TONSILLECTOMY    :   Current Outpatient Medications:  .  feeding supplement, ENSURE ENLIVE, (ENSURE ENLIVE) LIQD, Take 6 mLs by mouth 2 (two) times daily between meals. (Patient  taking differently: Take 237 mLs by mouth daily. ), Disp: 60 Bottle, Rfl: 0 .  ferrous sulfate 325 (65 FE) MG EC tablet, Take 325 mg by mouth daily with breakfast., Disp: , Rfl:  .  gabapentin (NEURONTIN) 600 MG tablet, Take 1 tablet (600 mg total) by mouth every morning. And 2 at bedtime (Patient taking differently: Take 600 mg by mouth 2 (two) times daily. ), Disp: 180 tablet, Rfl: 3 .  metoprolol succinate (TOPROL-XL) 25 MG 24 hr tablet, TAKE ONE TABLET BY MOUTH DAILY, Disp: 30 tablet, Rfl: 11 .  Multiple Vitamin (MULTIVITAMIN WITH MINERALS) TABS tablet, Take 1 tablet by mouth daily., Disp: 30 tablet, Rfl: 0 .  omeprazole (PRILOSEC) 20 MG capsule, Take 20 mg by mouth every morning., Disp: , Rfl:  .  pramipexole (MIRAPEX) 0.125 MG tablet, Take 1 tablet (0.125 mg total) by mouth at bedtime as needed. (Patient taking differently: Take 0.125 mg by mouth at bedtime as needed (restless legs). ), Disp: 30 tablet, Rfl: 5 .  Probiotic Product (ALIGN) 4 MG CAPS, Take 4 mg by mouth daily., Disp: , Rfl:  .  tamsulosin (FLOMAX) 0.4 MG CAPS capsule, Take 1 capsule (0.4 mg total) by mouth daily. (Patient taking differently: Take 0.4 mg by mouth 2 (two) times daily. ), Disp: 90 capsule, Rfl: 3 .  vitamin B-12 (CYANOCOBALAMIN) 500 MCG tablet, Take 500 mcg by mouth daily., Disp: , Rfl: :  :  No Known Allergies:  FH: Mother deceased with breast cancer; father deceased with lung cancer; brother deceased with metastatic cancer  SOCIAL HISTORY: He lives in [place] by himself.  His wife died several years ago.  He has family that live within 1/4 mile of his home.  He has many of his meals with family.  He is retired.  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  No tobacco use.  Occasional alcohol intake.  Review of Systems: He reports his appetite is stable.  He does think he has lost some weight.  He denies pain.  He feels weak.  He reports he spends the majority  of the day out of bed and yesterday planted a garden.  No fever.  No bleeding.  He denies shortness of breath.  No cough.  No dysphagia.  No chest pain.  He has loose bowel movements since undergoing gallbladder removal several months ago.  He notes difficulty initiating the urine stream at times.  He takes Flomax.  Physical Exam:  Blood pressure 133/71, pulse 76, temperature 98.1 F (36.7 C), temperature source Oral, resp. rate 17, height 5\' 10"  (1.778 m), weight 125 lb 11.2 oz (57 kg), SpO2 100 %. Elderly man, extremely hard of hearing. HEENT: PERRLA.  Sclera anicteric.  Poor dentition, multiple missing teeth. Lungs: Lungs clear bilaterally. Cardiac: Regular rate and rhythm. Abdomen: Abdomen soft and nontender.  No hepatomegaly. Vascular: Trace edema at the lower legs bilaterally. Lymph nodes: No palpable cervical, supraclavicular, axillary lymph nodes. Neurologic: Alert and oriented.  LABS:  RADIOLOGY:  Ct Abdomen Pelvis W Contrast  Result Date: 08/18/2018 CLINICAL DATA:  Pancreatic cancer follow up EXAM: CT ABDOMEN AND PELVIS WITH CONTRAST TECHNIQUE: Multidetector CT imaging  of the abdomen and pelvis was performed using the standard protocol following bolus administration of intravenous contrast. CONTRAST:  148mL ISOVUE-300 IOPAMIDOL (ISOVUE-300) INJECTION 61% COMPARISON:  04/19/2018 CT scan FINDINGS: Lower chest: Dependent ground-glass opacities in both lungs but with improvement of the prior basilar airspace opacities. Small bilateral pleural effusions. In the posterior basal segment left lower lobe there is a 1.1 by 0.9 cm smooth pulmonary nodule on image 25/5. This is not seen on the prior exam, although it could have been present but obscured by the surrounding airspace opacity. Potential small loculated component of the left basilar pleural effusion. Upper most images demonstrate a right infrahilar lymph node measuring 1.1 cm in short axis on image 1/2. Hepatobiliary: Pneumobilia. No  compelling findings of a patent metastatic disease. The previous biliary stent is no longer present. Pancreas: An infiltrative mass in the head of the pancreas by my measurements measures 3.4 by 2.1 by 2.4 cm (volume = 9 cm^3) on image 32/2, and by my measurements was previously about 2.7 by 1.7 by 2.0 cm (volume = 4.8 cm^3). The mass has clearly enlarged and there is rind of tumor extending along the posterior 180 degrees of the superior mesenteric artery as shown on images 32 through 33 of series 2. There is probably an intact fat plane between the mass and the superior mesenteric vein. Prominent dilatation of the dorsal pancreatic duct noted. The proper hepatic artery appears to arise in the standard fashion from the celiac trunk. Spleen: Unremarkable Adrenals/Urinary Tract: The adrenal glands appear normal. Urinary bladder unremarkable. Small left renal cysts are present. Stomach/Bowel: There are 2 small diverticula of the transverse duodenum. The pancreatic mass abuts a small portion of the transverse duodenum. Vascular/Lymphatic: Aortoiliac atherosclerotic vascular disease. As noted above, the pancreatic mass appears to abut the posterior 180 degrees of the superior mesenteric artery. No pathologic adenopathy is identified. Reproductive: Mild prostatomegaly. Other: Low-grade mesenteric and subcutaneous edema. Trace ascites in the right lower paracolic gutter. Musculoskeletal: Grade 1 degenerative retrolisthesis at L2-3. IMPRESSION: 1. The infiltrative mass in the pancreatic head has increased in volume by about 88% in the last 4 months. The mass abuts the posterior half of the superior mesenteric artery. Prominent dorsal pancreatic duct dilatation. 2. There is a newly visible 1.1 by 0.9 cm smooth pulmonary nodule in the right lower lobe. Although this may have been present previously but obscured by previous airspace opacity, I suspect that this is a new pulmonary nodule and quite possibly metastatic. Mild  right infrahilar adenopathy. 3. No definite liver lesions are currently identified. The biliary stent is no longer present. Pneumobilia is noted without current significant biliary dilatation. 4. Other imaging findings of potential clinical significance: Improved bibasilar airspace opacities although dependent ground-glass opacities are still present. Small bilateral pleural effusions potentially with some mild loculation on the left. Aortic Atherosclerosis (ICD10-I70.0). Mild prostatomegaly. Trace ascites with low-grade mesenteric and subcutaneous edema. Electronically Signed   By: Van Clines M.D.   On: 08/18/2018 15:12   Dg Ercp  Result Date: 08/10/2018 CLINICAL DATA:  Removal of biliary stent EXAM: ERCP TECHNIQUE: Multiple spot images obtained with the fluoroscopic device and submitted for interpretation post-procedure. FLUOROSCOPY TIME:  4 minutes 58 seconds (21 mGy). COMPARISON:  CT abdomen and pelvis - 04/19/2018; ERCP with biliary stent placement-03/30/2018 FINDINGS: Ten spot intraoperative fluoroscopic images of the right upper abdominal quadrant during ERCP are provided for review Initial image demonstrates an ERCP probe overlying the right upper abdominal quadrant. An internal metallic biliary  stent overlies expected location of the CBD. Subsequent images demonstrate removal of the metallic biliary stent with opacification the central aspect of the common bile duct and intrahepatic biliary system which appears mild to moderately dilated. Subsequent images demonstrate insufflation a balloon within peripheral aspect of the CBD with subsequent presumed biliary sweeping and sphincterotomy. IMPRESSION: ERCP with internal biliary stent retrieval with subsequent biliary sweeping and presumed sphincterotomy as above. These images were submitted for radiologic interpretation only. Please see the procedural report for the amount of contrast and the fluoroscopy time utilized. Electronically Signed   By:  Sandi Mariscal M.D.   On: 08/10/2018 10:58    Assessment:   1. Pancreas cancer  Hospitalization October 2019 with obstructive jaundice/sepsis (blood culture positive for E. Coli)  CT abdomen/pelvis 03/11/2018- indistinct appearance of pancreas with mild surrounding edema/stranding suspicious for acute pancreatitis; interval cholecystectomy with development of intra-and extrahepatic biliary enlargement; sigmoid colon diverticular disease   ERCP 03/13/2018-single segmental biliary stricture in the lower third of the main bile duct.  A plastic stent was placed into the common bile duct.  Hospitalization November 2019 with obstructive jaundice  CT abdomen/pelvis 03/27/2018-diffuse soft tissue inflammation about the pancreas compatible with acute pancreatitis; diffuse dilatation of the intrahepatic biliary ducts and common bile duct with a common bile duct stent in the expected position; diffuse dilatation of the pancreatic duct  ERCP 03/30/2018-major papilla to be edematous and erythematous.  Biliary tree was swept and pus was found.  Plastic stent was removed.  Metal stent was placed into the common bile duct.  Pathology on the removed stent was negative for malignant cells.    ERCP 08/10/2018-choledocholithiasis with complete removal by balloon extraction.  A stent was removed from the biliary tree.  The stent was partially occluded.  Biliary tree was swept with nothing found at the end of the procedure, there was adequate biliary drainage and no obvious stricture remained.  Pathology on the bile duct stent showed malignant cells present consistent with adenocarcinoma.    CT abdomen/pelvis 08/18/2018-infiltrative mass in the head of the pancreas increased in volume; mass abuts the posterior half of the superior mesenteric artery; new 1.1 x 0.9 smooth pulmonary nodule right lower lobe; mild right infrahilar adenopathy; no definite liver lesions identified; biliary stent no longer present; pneumobilia  noted. 2. Acute cholecystitis August 2019 status post drain placement; status post laparoscopic cholecystectomy September 2019 3. Hospitalization December 2019 with sepsis/bilateral lower lobe pneumonia 4. BPH on Flomax 5. Hypertension 6. Macrocytic anemia  Disposition: Jose [name] appears to have locally advanced unresectable pancreas cancer versus pancreas cancer metastatic to lung.  Dr. Benay Spice reviewed the diagnosis, prognosis and treatment options with him at today's visit.  He understands that no therapy will be curative.  Dr. Benay Spice discussed supportive/comfort measures versus a trial of chemotherapy.  The recommendation is for supportive/comfort measures.  Jose [name] agrees with this plan.  Dr. Benay Spice contacted Jose [name] daughter-in-law, Jose [name], by phone and reviewed the above with her as well.  We reviewed the most recent labs in the electronic medical record.  He had anemia, mild thrombocytopenia and LFT abnormalities on 08/10/2018.  We will obtain repeat labs while he is in the office today.  His daughter-in-law reports he is scheduled to have a new stent placed tomorrow.  He will return for a follow-up visit in approximately 1 month.  We are available to see him sooner if needed.  Patient seen with Dr. Benay Spice.  60 minutes were spent face-to-face at  today's visit with the majority of that time involved in counseling/coordination of care.  Jose Card, NP 08/26/2018, 10:24 AM   This was a shared visit with Jose [name].  I reviewed the treatment history and imaging studies.  Jose [name] has been diagnosed with pancreas cancer.  He appears to have locally advanced disease with vascular involvement.  There is also a right lower lung nodule and he therefore may have stage IV disease.  I discussed the prognosis with Jose [name].  We discussed treatment options.  I explained no therapy will be curative.  I discussed the case with his daughter-in-law and son by telephone. They  understand the pancreas cancer has likely been present for many months.  We discussed the expected prognosis for surviving months as compared to years.  I explained the potential survival benefit with chemotherapy.  Jose [name] does not appear symptomatic from the cancer at present.  I think it would be difficult for him to tolerate chemotherapy based on his age, hearing deficit, and need for frequent travel to the Cancer center.  It would be more difficult and risky for him to receive chemotherapy with the current viral pandemic.  I recommend observation.  They are in agreement.  He will return for office visit in 3 to 4 weeks for further discussion.  He is scheduled for biliary stent placement with Dr. Watt [name].  He has macrocytic anemia of unclear etiology.  We will evaluate this further.  Julieanne Manson, MD

## 2018-08-26 NOTE — Telephone Encounter (Signed)
Needs additional labs that could not be run from samples taken today. Left VM for daughter to call with time he could come tomorrow for labs prior to his stent placement at [facility].

## 2018-08-27 ENCOUNTER — Inpatient Hospital Stay: Payer: Medicare Other

## 2018-08-27 ENCOUNTER — Ambulatory Visit (HOSPITAL_COMMUNITY): Payer: Medicare Other

## 2018-08-27 ENCOUNTER — Other Ambulatory Visit: Payer: Self-pay

## 2018-08-27 ENCOUNTER — Ambulatory Visit (HOSPITAL_COMMUNITY): Payer: Medicare Other | Admitting: Physician Assistant

## 2018-08-27 ENCOUNTER — Encounter (HOSPITAL_COMMUNITY)
Admission: RE | Disposition: A | Discharge status: Home / Self Care | Payer: Self-pay | Source: Home / Self Care | Attending: Gastroenterology

## 2018-08-27 ENCOUNTER — Encounter (HOSPITAL_COMMUNITY): Payer: Self-pay | Admitting: *Deleted

## 2018-08-27 ENCOUNTER — Ambulatory Visit (HOSPITAL_COMMUNITY)
Admission: RE | Admit: 2018-08-27 | Discharge: 2018-08-27 | Disposition: A | Discharge status: Home / Self Care | Payer: Medicare Other | Attending: Gastroenterology | Admitting: Gastroenterology

## 2018-08-27 DIAGNOSIS — D649 Anemia, unspecified: Secondary | ICD-10-CM | POA: Diagnosis not present

## 2018-08-27 DIAGNOSIS — K831 Obstruction of bile duct: Secondary | ICD-10-CM | POA: Diagnosis not present

## 2018-08-27 DIAGNOSIS — I10 Essential (primary) hypertension: Secondary | ICD-10-CM | POA: Diagnosis not present

## 2018-08-27 DIAGNOSIS — C250 Malignant neoplasm of head of pancreas: Secondary | ICD-10-CM | POA: Diagnosis present

## 2018-08-27 DIAGNOSIS — D539 Nutritional anemia, unspecified: Secondary | ICD-10-CM

## 2018-08-27 DIAGNOSIS — F419 Anxiety disorder, unspecified: Secondary | ICD-10-CM | POA: Diagnosis not present

## 2018-08-27 DIAGNOSIS — C801 Malignant (primary) neoplasm, unspecified: Secondary | ICD-10-CM

## 2018-08-27 DIAGNOSIS — K219 Gastro-esophageal reflux disease without esophagitis: Secondary | ICD-10-CM | POA: Insufficient documentation

## 2018-08-27 DIAGNOSIS — M1990 Unspecified osteoarthritis, unspecified site: Secondary | ICD-10-CM | POA: Insufficient documentation

## 2018-08-27 HISTORY — DX: Malignant neoplasm of pancreas, unspecified: C25.9

## 2018-08-27 HISTORY — PX: ERCP: SHX5425

## 2018-08-27 HISTORY — DX: Unspecified hearing loss, unspecified ear: H91.90

## 2018-08-27 HISTORY — PX: BILIARY STENT PLACEMENT: SHX5538

## 2018-08-27 LAB — VITAMIN B12: Vitamin B-12: 721 pg/mL (ref 180–914)

## 2018-08-27 LAB — CANCER ANTIGEN 19-9: CA 19-9: 60 U/mL — ABNORMAL HIGH (ref 0–35)

## 2018-08-27 SURGERY — ERCP, WITH INTERVENTION IF INDICATED
Anesthesia: General

## 2018-08-27 MED ORDER — CIPROFLOXACIN IN D5W 400 MG/200ML IV SOLN
400.0000 mg | Freq: Two times a day (BID) | INTRAVENOUS | Status: DC
  Administered 2018-08-27: 400 mg via INTRAVENOUS

## 2018-08-27 MED ORDER — EPHEDRINE SULFATE-NACL 50-0.9 MG/10ML-% IV SOSY
PREFILLED_SYRINGE | INTRAVENOUS | Status: DC | PRN
  Administered 2018-08-27 (×2): 10 mg via INTRAVENOUS

## 2018-08-27 MED ORDER — FENTANYL CITRATE (PF) 100 MCG/2ML IJ SOLN
INTRAMUSCULAR | Status: AC
  Filled 2018-08-27: qty 2

## 2018-08-27 MED ORDER — LACTATED RINGERS IV SOLN
INTRAVENOUS | Status: DC | PRN
  Administered 2018-08-27: 12:00:00 via INTRAVENOUS

## 2018-08-27 MED ORDER — FENTANYL CITRATE (PF) 100 MCG/2ML IJ SOLN
INTRAMUSCULAR | Status: DC | PRN
  Administered 2018-08-27: 25 ug via INTRAVENOUS
  Administered 2018-08-27: 50 ug via INTRAVENOUS
  Administered 2018-08-27: 25 ug via INTRAVENOUS

## 2018-08-27 MED ORDER — SODIUM CHLORIDE 0.9 % IV SOLN: INTRAVENOUS | Status: DC

## 2018-08-27 MED ORDER — PROPOFOL 10 MG/ML IV BOLUS
INTRAVENOUS | Status: DC | PRN
  Administered 2018-08-27: 70 mg via INTRAVENOUS

## 2018-08-27 MED ORDER — PHENYLEPHRINE HCL 10 MG/ML IJ SOLN
INTRAMUSCULAR | Status: DC | PRN
  Administered 2018-08-27 (×2): 80 ug via INTRAVENOUS

## 2018-08-27 MED ORDER — LIDOCAINE 2% (20 MG/ML) 5 ML SYRINGE
INTRAMUSCULAR | Status: DC | PRN
  Administered 2018-08-27: 50 mg via INTRAVENOUS

## 2018-08-27 MED ORDER — CIPROFLOXACIN IN D5W 400 MG/200ML IV SOLN
INTRAVENOUS | Status: AC
  Filled 2018-08-27: qty 200

## 2018-08-27 MED ORDER — PROPOFOL 10 MG/ML IV BOLUS
INTRAVENOUS | Status: AC
  Filled 2018-08-27: qty 40

## 2018-08-27 MED ORDER — ONDANSETRON HCL 4 MG/2ML IJ SOLN
INTRAMUSCULAR | Status: DC | PRN
  Administered 2018-08-27: 4 mg via INTRAVENOUS

## 2018-08-27 MED ORDER — SODIUM CHLORIDE 0.9 % IV SOLN
INTRAVENOUS | Status: DC | PRN
  Administered 2018-08-27: 10 mL

## 2018-08-27 MED ORDER — LACTATED RINGERS IV SOLN: INTRAVENOUS | Status: DC

## 2018-08-27 MED ORDER — SUCCINYLCHOLINE CHLORIDE 200 MG/10ML IV SOSY
PREFILLED_SYRINGE | INTRAVENOUS | Status: DC | PRN
  Administered 2018-08-27: 100 mg via INTRAVENOUS

## 2018-08-27 NOTE — Telephone Encounter (Signed)
Daughter called back and agreed to lab at 1045 today prior to his check in for endo at 1130.

## 2018-08-27 NOTE — Discharge Instructions (Signed)
YOU HAD AN ENDOSCOPIC PROCEDURE TODAY: Refer to the procedure report and other information in the discharge instructions given to you for any specific questions about what was found during the examination. If this information does not answer your questions, please call [facility] office at [phone] to clarify.   YOU SHOULD EXPECT: Some feelings of bloating in the abdomen. Passage of more gas than usual. Walking can help get rid of the air that was put into your GI tract during the procedure and reduce the bloating. If you had a lower endoscopy (such as a colonoscopy or flexible sigmoidoscopy) you may notice spotting of blood in your stool or on the toilet paper. Some abdominal soreness may be present for a day or two, also.  DIET: Clear Liquids until 6pm tonight, if tolerated then soft solids for dinner. Advance diet as tolerated tomorrow.  ACTIVITY: Your care partner should take you home directly after the procedure. You should plan to take it easy, moving slowly for the rest of the day. You can resume normal activity the day after the procedure however YOU SHOULD NOT DRIVE, use power tools, machinery or perform tasks that involve climbing or major physical exertion for 24 hours (because of the sedation medicines used during the test).   SYMPTOMS TO REPORT IMMEDIATELY: A gastroenterologist can be reached at any hour. Please call [phone]  for any of the following symptoms:   Following lower endoscopy (colonoscopy, flexible sigmoidoscopy) Excessive amounts of blood in the stool  Significant tenderness, worsening of abdominal pains  Swelling of the abdomen that is new, acute  Fever of 100 or higher   Following upper endoscopy (EGD, EUS, ERCP, esophageal dilation) Vomiting of blood or coffee ground material  New, significant abdominal pain  New, significant chest pain or pain under the shoulder blades  Painful or persistently difficult swallowing  New shortness of breath  Black,  tarry-looking or red, bloody stools  FOLLOW UP:   Call [phone] with any specific questions about appointments or follow up tests. Clear liquids only until 6 PM and if doing well at that point may have soft solids and do not operate equipment mowers etc. today and call me if any GI question or problem and follow-up with me as needed

## 2018-08-27 NOTE — Progress Notes (Signed)
Wilson Singer [place] 12:44 PM  Subjective: Patient doing great since we last saw him and his case discussed with his primary care physician via the hospital computer as well as his daughter-in-law multiple times on the phone he has no GI complaints and he has been working in the yard  Objective: Vital signs stable afebrile no acute distress exam please see preassessment evaluation recent labs improved CBC stable CA 19 mildly elevated  Assessment: Pancreatic cancer  Plan: Okay to proceed with ERCP and metal stenting of CBD prior to obstruction with anesthesia assistance  [facility]  Pager [phone] After 5PM or if no answer call [phone]

## 2018-08-27 NOTE — Transfer of Care (Signed)
Immediate Anesthesia Transfer of Care Note  Patient: Shaunte Weissinger [facility]  Procedure(s) Performed: ENDOSCOPIC RETROGRADE CHOLANGIOPANCREATOGRAPHY (ERCP) with STENT PLACEMENT (N/A ) BILIARY STENT PLACEMENT (N/A )  Patient Location: PACU and Endoscopy Unit  Anesthesia Type:General  Level of Consciousness: awake, alert  and oriented  Airway & Oxygen Therapy: Patient Spontanous Breathing and Patient connected to face mask oxygen  Post-op Assessment: Report given to RN and Post -op Vital signs reviewed and stable  Post vital signs: Reviewed and stable  Last Vitals:  Vitals Value Taken Time  BP    Temp    Pulse 71 08/27/2018  2:29 PM  Resp 20 08/27/2018  2:29 PM  SpO2 94 % 08/27/2018  2:29 PM  Vitals shown include unvalidated device data.  Last Pain:  Vitals:   08/27/18 1420  TempSrc:   PainSc: 0-No pain         Complications: No apparent anesthesia complications

## 2018-08-27 NOTE — Anesthesia Procedure Notes (Signed)
Procedure Name: Intubation Date/Time: 08/27/2018 1:14 PM Performed by: Lavina Hamman, CRNA Pre-anesthesia Checklist: Patient identified, Emergency Drugs available, Suction available, Patient being monitored and Timeout performed Patient Re-evaluated:Patient Re-evaluated prior to induction Oxygen Delivery Method: Circle system utilized Preoxygenation: Pre-oxygenation with 100% oxygen Induction Type: IV induction, Rapid sequence and Cricoid Pressure applied Laryngoscope Size: Mac and 4 Grade View: Grade I Tube type: Oral Tube size: 7.0 mm Number of attempts: 1 Airway Equipment and Method: Stylet Placement Confirmation: ETT inserted through vocal cords under direct vision,  positive ETCO2,  CO2 detector and breath sounds checked- equal and bilateral Secured at: 22 cm Tube secured with: Tape Dental Injury: Teeth and Oropharynx as per pre-operative assessment  Comments: ATOI.  RSI due to Covid 19 pandemic, patient asymptomatic.

## 2018-08-27 NOTE — Op Note (Signed)
[facility] Patient Name: Jose [name] Procedure Date: 08/27/2018 MRN: [id number] Attending MD: Clarene Essex , MD Date of Birth: [birth year] CSN: [id number] Age: 85 Admit Type: Outpatient Procedure:                ERCP Indications:              Malignant tumor of the head of pancreas, distal CBD                            stricture Providers:                Clarene Essex, MD, Burtis Junes, RN, Cletis Athens,                            Technician, Arnoldo Hooker, CRNA Referring MD:              Medicines:                General Anesthesia Complications:            No immediate complications. Estimated Blood Loss:     Estimated blood loss: none. Procedure:                Pre-Anesthesia Assessment:                           - Prior to the procedure, a History and Physical                            was performed, and patient medications and                            allergies were reviewed. The patient's tolerance of                            previous anesthesia was also reviewed. The risks                            and benefits of the procedure and the sedation                            options and risks were discussed with the patient.                            All questions were answered, and informed consent                            was obtained. Prior Anticoagulants: The patient has                            taken no previous anticoagulant or antiplatelet                            agents. ASA Grade Assessment: III - A patient with  severe systemic disease. After reviewing the risks                            and benefits, the patient was deemed in                            satisfactory condition to undergo the procedure.                           After obtaining informed consent, the scope was                            passed under direct vision. Throughout the                            procedure, the patient's blood pressure, pulse, and                        oxygen saturations were monitored continuously. The                            [id number]-Q180V ([id number] Olympus duodenoscope was                            introduced through the mouth, and used to inject                            contrast into and used to cannulate the bile duct.                            The ERCP was accomplished without difficulty. The                            patient tolerated the procedure well. Scope In: Scope Out: Findings:      A biliary sphincterotomy had been performed. The sphincterotomy appeared       open. The major papilla was normal. Deep selective cannulation was       readily obtained and there was no pancreatic duct injection or wire       advancement throughout the procedure and on initial cholangiogram and an       obvious distal stricture was seen without much biliary drainage and we       placed one 10 Fr by 6 cm uncovered metal stent with no internal flaps       was placed 5.5 cm into the common bile duct. The stent was in good       position. The wire and introducer were removed the scope was removed       patient tolerated the procedure well there was no obvious immediate       complication Impression:               - Prior biliary sphincterotomy appeared open.                           - The major papilla appeared normal.                           -  One uncovered metal stent was placed into the                            distal common bile duct. Moderate Sedation:      Not Applicable - Patient had care per Anesthesia. Recommendation:           - Clear liquid diet for 6 hours.                           - Continue present medications.                           - Return to GI clinic PRN.                           - Telephone GI clinic if symptomatic PRN. Procedure Code(s):        --- Professional ---                           [phone], Endoscopic retrograde                            cholangiopancreatography (ERCP); with  placement of                            endoscopic stent into biliary or pancreatic duct,                            including pre- and post-dilation and guide wire                            passage, when performed, including sphincterotomy,                            when performed, each stent Diagnosis Code(s):        --- Professional ---                           C25.0, Malignant neoplasm of head of pancreas CPT copyright 2019 American Medical Association. All rights reserved. The codes documented in this report are preliminary and upon coder review may  be revised to meet current compliance requirements. Clarene Essex, MD 08/27/2018 1:36:21 PM This report has been signed electronically. Number of Addenda: 0

## 2018-08-27 NOTE — Anesthesia Preprocedure Evaluation (Addendum)
Anesthesia Evaluation  Patient identified by MRN, date of birth, ID band Patient awake    Reviewed: Allergy & Precautions, NPO status , Patient's Chart, lab work & pertinent test results, reviewed documented beta blocker date and time   Airway Mallampati: II  TM Distance: >3 FB Neck ROM: Full    Dental no notable dental hx. (+) Edentulous Upper, Missing,    Pulmonary neg pulmonary ROS,    Pulmonary exam normal breath sounds clear to auscultation       Cardiovascular hypertension, Pt. on home beta blockers and Pt. on medications negative cardio ROS Normal cardiovascular exam Rhythm:Regular Rate:Normal     Neuro/Psych PSYCHIATRIC DISORDERS Anxiety negative neurological ROS     GI/Hepatic Neg liver ROS, GERD  Medicated,  Endo/Other  negative endocrine ROS  Renal/GU negative Renal ROS  negative genitourinary   Musculoskeletal  (+) Arthritis ,   Abdominal   Peds  Hematology  (+) Blood dyscrasia (Hgb 9.8), anemia ,   Anesthesia Other Findings Pancreatic cancer  Reproductive/Obstetrics                            Anesthesia Physical Anesthesia Plan  ASA: III  Anesthesia Plan: General   Post-op Pain Management:    Induction: Intravenous  PONV Risk Score and Plan: 2 and Ondansetron and Treatment may vary due to age or medical condition  Airway Management Planned: Oral ETT  Additional Equipment:   Intra-op Plan:   Post-operative Plan: Extubation in OR  Informed Consent: I have reviewed the patients History and Physical, chart, labs and discussed the procedure including the risks, benefits and alternatives for the proposed anesthesia with the patient or authorized representative who has indicated his/her understanding and acceptance.     Dental advisory given  Plan Discussed with: CRNA  Anesthesia Plan Comments:         Anesthesia Quick Evaluation

## 2018-08-27 NOTE — Anesthesia Postprocedure Evaluation (Signed)
Anesthesia Post Note  Patient: Toriano Aikey  Procedure(s) Performed: ENDOSCOPIC RETROGRADE CHOLANGIOPANCREATOGRAPHY (ERCP) with STENT PLACEMENT (N/A ) BILIARY STENT PLACEMENT (N/A )     Patient location during evaluation: Endoscopy Anesthesia Type: General Level of consciousness: awake and alert Pain management: pain level controlled Vital Signs Assessment: post-procedure vital signs reviewed and stable Respiratory status: spontaneous breathing, nonlabored ventilation, respiratory function stable and patient connected to nasal cannula oxygen Cardiovascular status: blood pressure returned to baseline and stable Postop Assessment: no apparent nausea or vomiting Anesthetic complications: no    Last Vitals:  Vitals:   08/27/18 1400 08/27/18 1405  BP: (!) 153/61 (!) 153/61  Pulse: 65 68  Resp: 14 14  Temp:    SpO2: 100% 100%    Last Pain:  Vitals:   08/27/18 1420  TempSrc:   PainSc: 0-No pain                 Chelsey L Woodrum

## 2018-08-28 LAB — FOLATE RBC
Folate, Hemolysate: >620 ng/mL
Folate, RBC: >2238 ng/mL (ref >498)
Hematocrit: 27.7 % — ABNORMAL LOW (ref 37.5–51.0)

## 2018-09-02 ENCOUNTER — Telehealth: Payer: Self-pay | Admitting: *Deleted

## 2018-09-02 ENCOUNTER — Other Ambulatory Visit: Payer: Self-pay | Admitting: Nurse Practitioner

## 2018-09-02 ENCOUNTER — Telehealth: Payer: Self-pay

## 2018-09-02 DIAGNOSIS — C259 Malignant neoplasm of pancreas, unspecified: Secondary | ICD-10-CM

## 2018-09-02 MED ORDER — TRAMADOL HCL 50 MG PO TABS: 50.0000 mg | ORAL_TABLET | Freq: Two times a day (BID) | ORAL | 0 refills | Status: DC | PRN

## 2018-09-02 NOTE — Telephone Encounter (Signed)
Jose [name] called to discuss process of transitioning her father-in-law to palliative care/hospice. After discussion she feels that Jose [name] would like Dr. Silvio Pate to start the referral process. Patient is also having increased pain. She will call Dr. Alla German office to discuss both issues. I relayed that Dr. Benay Spice can be attending on record if they change their mind and also address pain.  Jose [name] has my direct number for future questions or concerns.

## 2018-09-02 NOTE — Telephone Encounter (Signed)
Thanks, we will be glad to help as needed

## 2018-09-02 NOTE — Telephone Encounter (Signed)
Patient's daughter-in-law called stating that he has recently been diagnosed with pancreatic cancer. [name] stated that patient started Friday having pain from rib to rib. [name] wants to know if Dr. Silvio Pate thinks that the pain is related to his cancer? [name] stated that she called his oncologist office and she was told to contact his PCP. [name] stated that his hemoglobin is 9.8 and he taking iron. [name] stated that she wants to know if Dr. Silvio Pate may give him something to take the edge off of his pain?  [name] stated that patient is getting a little depressed. [facility] [place] Advised [name] that Dr. Silvio Pate is out of the office this afternoon, but she requested that the message go to him.

## 2018-09-02 NOTE — Telephone Encounter (Signed)
Daughter-in-law reports he is starting to have pain in rib cage area that radiates back. Informed her this is a common site of pain for pancreas cancer. She is asking who should manage his pain and cancer issues. Informed her that the medical oncologist usually will manage this and Dr. Benay Spice will do so if they want him to and we will make any referrals such as Hospice or home health that he may need, unless she prefers her PCP to do so. Informed her that even when patient is in hospice Dr. Benay Spice will continue to be the attending. She does not feel he is ready for hospice yet, but does need something for pain. Suggested trying Tramadol, which is a very low grade narcotic. She agrees to this medication. Notified Ned Card, NP and she will escribe the prescription. Will start a low dose of 50 mg every 12 hours as needed. Hold for excess sedation. Also may try Tylenol or Ibuprofen as well first and give Tramadol if the OTCs don't work. She will relay all this to him when she goes to his home this evening.

## 2018-09-03 ENCOUNTER — Telehealth: Payer: Self-pay

## 2018-09-03 NOTE — Telephone Encounter (Signed)
Spoke to DIL He did get Rx for tramadol from the NP at [facility] will get that I will plan home visit next week to review consideration of hospice and symptom management

## 2018-09-03 NOTE — Telephone Encounter (Signed)
Called and spoke with son and daughter-in-law to follow-up on phone call from 4/15. She was able to reach Dr. Silvio Pate who will do an in home visit with patient next week. Pain management by Dr. Benay Spice. She voiced appreciation for help from Dr. Benay Spice and team. WIll offer support as needed.

## 2018-09-09 ENCOUNTER — Encounter: Payer: Self-pay | Admitting: Internal Medicine

## 2018-09-09 ENCOUNTER — Ambulatory Visit: Payer: Medicare Other | Admitting: Internal Medicine

## 2018-09-09 ENCOUNTER — Other Ambulatory Visit: Payer: Self-pay

## 2018-09-09 VITALS — BP 142/66 | HR 78 | Resp 24 | Wt 126.0 lb

## 2018-09-09 DIAGNOSIS — C259 Malignant neoplasm of pancreas, unspecified: Secondary | ICD-10-CM | POA: Diagnosis not present

## 2018-09-09 DIAGNOSIS — N401 Enlarged prostate with lower urinary tract symptoms: Secondary | ICD-10-CM

## 2018-09-09 DIAGNOSIS — I10 Essential (primary) hypertension: Secondary | ICD-10-CM

## 2018-09-09 DIAGNOSIS — E441 Mild protein-calorie malnutrition: Secondary | ICD-10-CM

## 2018-09-09 DIAGNOSIS — N138 Other obstructive and reflux uropathy: Secondary | ICD-10-CM

## 2018-09-09 NOTE — Assessment & Plan Note (Signed)
His weight has now stabilized Recommended he use the ensure daily

## 2018-09-09 NOTE — Progress Notes (Signed)
Subjective:    Patient ID: Jose [name], male    DOB: [birth year], 85 y.o.   MRN: [id number]  HPI Initial home visit since diagnosis of pancreatic  DIL and grandson are here  We were all shocked by the cancer diagnosis Saw Dr Watt Climes after---stent put back in [place] to see Dr Benay Spice for consultation---decision not to take action Bowels are loose and watery at times---- 5-6 times a day. No blood  Having some gas/pain along both flanks/ribs Tylenol and ibuprofen has helped some Not sure if he even tried the tramadol  Eats with DIL and son--they are very close--for dinner Has food in the house for other meals--he fixes his breakfast Has once a week cleaning person Eating   Slight chest  Not related to eating or activity Breathing is   No other joint pains Occasional "stomach pain" Still has spot on on left calf---still not better. Using ointment from dermatologist (TAC)---no sure if he is using it much  . Current Outpatient Medications on File Prior to Visit  Medication Sig Dispense Refill  . feeding supplement, ENSURE ENLIVE, (ENSURE ENLIVE) LIQD Take 6 mLs by mouth 2 (two) times daily between meals. (Patient taking differently: Take 237 mLs by mouth daily. ) 60 Bottle 0  . ferrous sulfate 325 (65 FE) MG EC tablet Take 325 mg by mouth daily with breakfast.    . gabapentin (NEURONTIN) 600 MG tablet Take 1 tablet (600 mg total) by mouth every morning. And 2 at bedtime (Patient taking differently: Take 600 mg by mouth 2 (two) times daily. ) 180 tablet 3  . metoprolol succinate (TOPROL-XL) 25 MG 24 hr tablet TAKE ONE TABLET BY MOUTH DAILY 30 tablet 11  . Multiple Vitamin (MULTIVITAMIN WITH MINERALS) TABS tablet Take 1 tablet by mouth daily. 30 tablet 0  . omeprazole (PRILOSEC) 20 MG capsule Take 20 mg by mouth every morning.    . pramipexole (MIRAPEX) 0.125 MG tablet Take 1 tablet (0.125 mg total) by mouth at bedtime as needed. (Patient taking differently: Take 0.125 mg by  mouth at bedtime as needed (restless legs). ) 30 tablet 5  . Probiotic Product (ALIGN) 4 MG CAPS Take 4 mg by mouth daily.    . tamsulosin (FLOMAX) 0.4 MG CAPS capsule Take 1 capsule (0.4 mg total) by mouth daily. (Patient taking differently: Take 0.4 mg by mouth 2 (two) times daily. ) 90 capsule 3  . traMADol (ULTRAM) 50 MG tablet Take 1 tablet (50 mg total) by mouth every 12 (twelve) hours as needed for moderate pain. 30 tablet 0  . vitamin B-12 (CYANOCOBALAMIN) 500 MCG tablet Take 500 mcg by mouth daily.     No current facility-administered medications on file prior to visit.     No Known Allergies  Past Medical History:  Diagnosis Date  . Anxiety   . Back problem 1960  . BPH (benign prostatic hypertrophy)   . Cervical radiculopathy    with neuropathy  . Dyspnea   . GERD (gastroesophageal reflux disease)   . HOH (hard of hearing)   . Hypertension   . Hypokalemia   . Melanoma in situ ([place]) 12/2016   neck  . Pancreatic adenocarcinoma [facility])    per patient D-I-L [name] , dx after last ercp, first oncology appt on 08-26-2018 at [facility]    Past Surgical History:  Procedure Laterality Date  . BILIARY STENT PLACEMENT  03/30/2018   Procedure: BILIARY STENT PLACEMENT;  Surgeon: Clarene Essex, MD;  Location: [facility]  ENDOSCOPY;  Service: Endoscopy;;  . BILIARY STENT PLACEMENT N/A 08/27/2018   Procedure: BILIARY STENT PLACEMENT;  Surgeon: Clarene Essex, MD;  Location: [facility] ENDOSCOPY;  Service: Endoscopy;  Laterality: N/A;  . CATARACT EXTRACTION     OD  . CHOLECYSTECTOMY N/A 01/26/2018   Procedure: LAPAROSCOPIC CHOLECYSTECTOMY;  Surgeon: Coralie Keens, MD;  Location: [place];  Service: General;  Laterality: N/A;  . ENDOSCOPIC RETROGRADE CHOLANGIOPANCREATOGRAPHY (ERCP) WITH PROPOFOL N/A 03/13/2018   Procedure: ENDOSCOPIC RETROGRADE CHOLANGIOPANCREATOGRAPHY (ERCP) WITH PROPOFOL;  Surgeon: Lucilla Lame, MD;  Location: [facility] ENDOSCOPY;  Service: Endoscopy;  Laterality: N/A;  . ERCP N/A 03/29/2018    Procedure: ENDOSCOPIC RETROGRADE CHOLANGIOPANCREATOGRAPHY (ERCP);  Surgeon: Ronnette Juniper, MD;  Location: [place];  Service: Gastroenterology;  Laterality: N/A;  . ERCP N/A 03/30/2018   Procedure: ENDOSCOPIC RETROGRADE CHOLANGIOPANCREATOGRAPHY (ERCP);  Surgeon: Clarene Essex, MD;  Location: [place];  Service: Endoscopy;  Laterality: N/A;  . ERCP N/A 08/10/2018   Procedure: ENDOSCOPIC RETROGRADE CHOLANGIOPANCREATOGRAPHY (ERCP);  Surgeon: Clarene Essex, MD;  Location: [facility] ENDOS[facility];  Service: Endoscopy;  Laterality: N/A;  Balloon sweep for stones  . ERCP N/A 08/27/2018   Procedure: ENDOSCOPIC RETROGRADE CHOLANGIOPANCREATOGRAPHY (ERCP) with STENT PLACEMENT;  Surgeon: Clarene Essex, MD;  Location: [facility] ENDOSCOPY;  Service: Endoscopy;  Laterality: N/A;  . IR PERC CHOLECYSTOSTOMY  12/28/2017  . LAPAROSCOPIC CHOLECYSTECTOMY  01/26/2018  . MELANOMA EXCISION  12/2016   in situ---Dr Dasher  . SPHINCTEROTOMY  03/30/2018   Procedure: SPHINCTEROTOMY;  Surgeon: Clarene Essex, MD;  Location: [facility] ENDOSCOPY;  Service: Endoscopy;;  . STENT REMOVAL  03/29/2018   Procedure: STENT REMOVAL;  Surgeon: Ronnette Juniper, MD;  Location: [place];  Service: Gastroenterology;;  . Lavell Islam REMOVAL  08/10/2018   Procedure: STENT REMOVAL;  Surgeon: Clarene Essex, MD;  Location: [facility] ENDOSCOPY;  Service: Endoscopy;;  . TONSILLECTOMY      Family History  Problem Relation Age of Onset  . Cancer Son   . Coronary artery disease Neg Hx   . Diabetes Neg Hx     Social History   Socioeconomic History  . Marital status: Widowed    Spouse name: Not on file  . Number of children: 2  . Years of education: Not on file  . Highest education level: Not on file  Occupational History  . Occupation: retiredOccupational psychologist [employer]    Employer: RETIRED  Social Needs  . Financial resource strain: Not on file  . Food insecurity:    Worry: Not on file    Inability: Not on file  . Transportation needs:    Medical: Not on file    Non-medical: Not  on file  Tobacco Use  . Smoking status: Never Smoker  . Smokeless tobacco: Never Used  Substance and Sexual Activity  . Alcohol use: No  . Drug use: No  . Sexual activity: Not on file  Lifestyle  . Physical activity:    Days per week: Not on file    Minutes per session: Not on file  . Stress: Not on file  Relationships  . Social connections:    Talks on phone: Patient refused    Gets together: Patient refused    Attends religious service: Patient refused    Active member of club or organization: Patient refused    Attends meetings of clubs or organizations: Patient refused    Relationship status: Patient refused  . Intimate partner violence:    Fear of current or ex partner: Patient refused    Emotionally abused: Patient refused  Physically abused: Patient refused    Forced sexual activity: Patient refused  Other Topics Concern  . Not on file  Social History Narrative   Has living will   Son Mikeal Hawthorne is health care POA.     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  Neck: No thyromegaly present.  Cardiovascular: Normal rate and regular rhythm.  Gr 2/6 coarse systolic murmur  Respiratory: Effort normal and breath sounds normal. No respiratory distress. He has no wheezes. He has no rales.  GI: He exhibits no distension. There is no abdominal tenderness. There is no rebound and no guarding.  Scaphoid but no mass  Psychiatric: He has a  normal mood and affect. His behavior is normal.           Assessment & Plan:

## 2018-09-09 NOTE — Assessment & Plan Note (Signed)
BP Readings from Last 3 Encounters:  09/09/18 (!) 142/66  08/27/18 (!) 153/61  08/26/18 133/71   Acceptable control

## 2018-09-09 NOTE — Assessment & Plan Note (Signed)
With known spread to biliary tract Doing fair right now Stent replaced so hopefully will not obstruct Family determined to keep him home---will hire the [place] group or family will stay with him Discussed hospice--he wishes to hold off for now (especially since they can't really come out much right now)

## 2018-09-09 NOTE — Assessment & Plan Note (Signed)
Some problems but doesn't seem to have retention

## 2018-09-11 ENCOUNTER — Emergency Department: Payer: Medicare Other

## 2018-09-11 ENCOUNTER — Inpatient Hospital Stay
Admission: EM | Admit: 2018-09-11 | Discharge: 2018-09-14 | DRG: 871 | Disposition: A | Discharge status: Home Health | Payer: Medicare Other | Attending: Internal Medicine | Admitting: Internal Medicine

## 2018-09-11 ENCOUNTER — Other Ambulatory Visit: Payer: Self-pay

## 2018-09-11 DIAGNOSIS — R509 Fever, unspecified: Secondary | ICD-10-CM | POA: Diagnosis not present

## 2018-09-11 DIAGNOSIS — Z681 Body mass index (BMI) 19 or less, adult: Secondary | ICD-10-CM

## 2018-09-11 DIAGNOSIS — B961 Klebsiella pneumoniae [K. pneumoniae] as the cause of diseases classified elsewhere: Secondary | ICD-10-CM | POA: Diagnosis present

## 2018-09-11 DIAGNOSIS — Z86006 Personal history of melanoma in-situ: Secondary | ICD-10-CM

## 2018-09-11 DIAGNOSIS — E876 Hypokalemia: Secondary | ICD-10-CM | POA: Diagnosis present

## 2018-09-11 DIAGNOSIS — A4159 Other Gram-negative sepsis: Secondary | ICD-10-CM | POA: Diagnosis not present

## 2018-09-11 DIAGNOSIS — R748 Abnormal levels of other serum enzymes: Secondary | ICD-10-CM | POA: Diagnosis present

## 2018-09-11 DIAGNOSIS — L89151 Pressure ulcer of sacral region, stage 1: Secondary | ICD-10-CM | POA: Diagnosis present

## 2018-09-11 DIAGNOSIS — A419 Sepsis, unspecified organism: Secondary | ICD-10-CM | POA: Diagnosis present

## 2018-09-11 DIAGNOSIS — R011 Cardiac murmur, unspecified: Secondary | ICD-10-CM | POA: Diagnosis present

## 2018-09-11 DIAGNOSIS — L8990 Pressure ulcer of unspecified site, unspecified stage: Secondary | ICD-10-CM

## 2018-09-11 DIAGNOSIS — Z8619 Personal history of other infectious and parasitic diseases: Secondary | ICD-10-CM

## 2018-09-11 DIAGNOSIS — Z79899 Other long term (current) drug therapy: Secondary | ICD-10-CM

## 2018-09-11 DIAGNOSIS — H9190 Unspecified hearing loss, unspecified ear: Secondary | ICD-10-CM | POA: Diagnosis present

## 2018-09-11 DIAGNOSIS — C259 Malignant neoplasm of pancreas, unspecified: Secondary | ICD-10-CM | POA: Diagnosis present

## 2018-09-11 DIAGNOSIS — M5412 Radiculopathy, cervical region: Secondary | ICD-10-CM | POA: Diagnosis present

## 2018-09-11 DIAGNOSIS — E46 Unspecified protein-calorie malnutrition: Secondary | ICD-10-CM | POA: Diagnosis present

## 2018-09-11 DIAGNOSIS — Z66 Do not resuscitate: Secondary | ICD-10-CM | POA: Diagnosis present

## 2018-09-11 DIAGNOSIS — G629 Polyneuropathy, unspecified: Secondary | ICD-10-CM | POA: Diagnosis present

## 2018-09-11 DIAGNOSIS — K831 Obstruction of bile duct: Secondary | ICD-10-CM | POA: Diagnosis present

## 2018-09-11 DIAGNOSIS — J9811 Atelectasis: Secondary | ICD-10-CM | POA: Diagnosis present

## 2018-09-11 DIAGNOSIS — I10 Essential (primary) hypertension: Secondary | ICD-10-CM | POA: Diagnosis present

## 2018-09-11 DIAGNOSIS — R7989 Other specified abnormal findings of blood chemistry: Secondary | ICD-10-CM

## 2018-09-11 DIAGNOSIS — K219 Gastro-esophageal reflux disease without esophagitis: Secondary | ICD-10-CM | POA: Diagnosis present

## 2018-09-11 DIAGNOSIS — N400 Enlarged prostate without lower urinary tract symptoms: Secondary | ICD-10-CM | POA: Diagnosis present

## 2018-09-11 DIAGNOSIS — R945 Abnormal results of liver function studies: Secondary | ICD-10-CM

## 2018-09-11 DIAGNOSIS — Z85820 Personal history of malignant melanoma of skin: Secondary | ICD-10-CM

## 2018-09-11 DIAGNOSIS — D61818 Other pancytopenia: Secondary | ICD-10-CM | POA: Diagnosis present

## 2018-09-11 DIAGNOSIS — Z20828 Contact with and (suspected) exposure to other viral communicable diseases: Secondary | ICD-10-CM | POA: Diagnosis present

## 2018-09-11 DIAGNOSIS — R0902 Hypoxemia: Secondary | ICD-10-CM | POA: Diagnosis present

## 2018-09-11 DIAGNOSIS — R7881 Bacteremia: Secondary | ICD-10-CM

## 2018-09-11 LAB — BLOOD GAS, VENOUS
Acid-base deficit: 1.3 mmol/L (ref 0.0–2.0)
Bicarbonate: 24.8 mmol/L (ref 20.0–28.0)
O2 Saturation: 88.9 %
Patient temperature: 37
pCO2, Ven: 46 mmHg (ref 44.0–60.0)
pH, Ven: 7.34 (ref 7.250–7.430)
pO2, Ven: 60 mmHg — ABNORMAL HIGH (ref 32.0–45.0)

## 2018-09-11 LAB — COMPREHENSIVE METABOLIC PANEL
ALT: 91 U/L — ABNORMAL HIGH (ref 0–44)
AST: 228 U/L — ABNORMAL HIGH (ref 15–41)
Albumin: 2.1 g/dL — ABNORMAL LOW (ref 3.5–5.0)
Alkaline Phosphatase: 401 U/L — ABNORMAL HIGH (ref 38–126)
Anion gap: 7 (ref 5–15)
BUN: 9 mg/dL (ref 8–23)
CO2: 19 mmol/L — ABNORMAL LOW (ref 22–32)
Calcium: 6.6 mg/dL — ABNORMAL LOW (ref 8.9–10.3)
Chloride: 111 mmol/L (ref 98–111)
Creatinine, Ser: 0.78 mg/dL (ref 0.61–1.24)
GFR calc Af Amer: >60 mL/min (ref >60)
GFR calc non Af Amer: >60 mL/min (ref >60)
Glucose, Bld: 126 mg/dL — ABNORMAL HIGH (ref 70–99)
Potassium: 3.2 mmol/L — ABNORMAL LOW (ref 3.5–5.1)
Sodium: 137 mmol/L (ref 135–145)
Total Bilirubin: 2.2 mg/dL — ABNORMAL HIGH (ref 0.3–1.2)
Total Protein: 4.1 g/dL — ABNORMAL LOW (ref 6.5–8.1)

## 2018-09-11 LAB — URINALYSIS, COMPLETE (UACMP) WITH MICROSCOPIC
Bacteria, UA: NONE SEEN
Bilirubin Urine: NEGATIVE
Glucose, UA: NEGATIVE mg/dL
Hgb urine dipstick: NEGATIVE
Ketones, ur: NEGATIVE mg/dL
Leukocytes,Ua: NEGATIVE
Nitrite: NEGATIVE
Protein, ur: NEGATIVE mg/dL
Specific Gravity, Urine: 1.01 (ref 1.005–1.030)
Squamous Epithelial / HPF: NONE SEEN (ref 0–5)
pH: 6 (ref 5.0–8.0)

## 2018-09-11 LAB — CBC WITH DIFFERENTIAL/PLATELET
Abs Immature Granulocytes: 0.06 10*3/uL (ref 0.00–0.07)
Basophils Absolute: 0 10*3/uL (ref 0.0–0.1)
Basophils Relative: 1 %
Eosinophils Absolute: 0.1 10*3/uL (ref 0.0–0.5)
Eosinophils Relative: 2 %
HCT: 31.3 % — ABNORMAL LOW (ref 39.0–52.0)
Hemoglobin: 10.5 g/dL — ABNORMAL LOW (ref 13.0–17.0)
Immature Granulocytes: 2 %
Lymphocytes Relative: 18 %
Lymphs Abs: 0.7 10*3/uL (ref 0.7–4.0)
MCH: 36.5 pg — ABNORMAL HIGH (ref 26.0–34.0)
MCHC: 33.5 g/dL (ref 30.0–36.0)
MCV: 108.7 fL — ABNORMAL HIGH (ref 80.0–100.0)
Monocytes Absolute: 0.3 10*3/uL (ref 0.1–1.0)
Monocytes Relative: 7 %
Neutro Abs: 2.7 10*3/uL (ref 1.7–7.7)
Neutrophils Relative %: 70 %
Platelets: 143 10*3/uL — ABNORMAL LOW (ref 150–400)
RBC: 2.88 MIL/uL — ABNORMAL LOW (ref 4.22–5.81)
RDW: 18 % — ABNORMAL HIGH (ref 11.5–15.5)
WBC: 3.9 10*3/uL — ABNORMAL LOW (ref 4.0–10.5)
nRBC: 0 % (ref 0.0–0.2)

## 2018-09-11 LAB — INFLUENZA PANEL BY PCR (TYPE A & B)
Influenza A By PCR: NEGATIVE
Influenza B By PCR: NEGATIVE

## 2018-09-11 LAB — APTT: aPTT: 27 seconds (ref 24–36)

## 2018-09-11 LAB — PROTIME-INR
INR: 1.3 — ABNORMAL HIGH (ref 0.8–1.2)
Prothrombin Time: 16 seconds — ABNORMAL HIGH (ref 11.4–15.2)

## 2018-09-11 LAB — TROPONIN I: Troponin I: <0.03 ng/mL (ref <0.03)

## 2018-09-11 LAB — SARS CORONAVIRUS 2 BY RT PCR (HOSPITAL ORDER, PERFORMED IN ~~LOC~~ HOSPITAL LAB): SARS Coronavirus 2: NEGATIVE

## 2018-09-11 LAB — LACTIC ACID, PLASMA: Lactic Acid, Venous: 3.1 mmol/L (ref 0.5–1.9)

## 2018-09-11 MED ORDER — VANCOMYCIN HCL 10 G IV SOLR
1500.0000 mg | Freq: Once | INTRAVENOUS | Status: AC
  Administered 2018-09-11: 23:00:00 1500 mg via INTRAVENOUS
  Filled 2018-09-11: qty 1500

## 2018-09-11 MED ORDER — VANCOMYCIN HCL IN DEXTROSE 1-5 GM/200ML-% IV SOLN: 1000.0000 mg | Freq: Once | INTRAVENOUS | Status: DC

## 2018-09-11 MED ORDER — SODIUM CHLORIDE 0.9 % IV BOLUS
1000.0000 mL | Freq: Once | INTRAVENOUS | Status: AC
  Administered 2018-09-11: 22:00:00 1000 mL via INTRAVENOUS

## 2018-09-11 MED ORDER — SODIUM CHLORIDE 0.9 % IV SOLN
1.0000 g | Freq: Two times a day (BID) | INTRAVENOUS | Status: DC
  Administered 2018-09-11 – 2018-09-14 (×6): 1 g via INTRAVENOUS
  Filled 2018-09-11 (×8): qty 1

## 2018-09-11 MED ORDER — SODIUM CHLORIDE 0.9 % IV BOLUS
500.0000 mL | Freq: Once | INTRAVENOUS | Status: AC
  Administered 2018-09-11: 500 mL via INTRAVENOUS

## 2018-09-11 MED ORDER — ACETAMINOPHEN 325 MG PO TABS
650.0000 mg | ORAL_TABLET | Freq: Once | ORAL | Status: AC
  Administered 2018-09-11: 650 mg via ORAL
  Filled 2018-09-11: qty 2

## 2018-09-11 NOTE — ED Triage Notes (Signed)
Ems called for flu like symptoms. Pt arrives with tachycardia, pox on ra 92%, fsbs 147. Pt, per family symptoms began suddenly one hour pta. Pt with hot dry skin. Per ems pt with diminished lung sounds. Pt normally lives alone and is active.

## 2018-09-11 NOTE — ED Notes (Signed)
Pt ringing call bell, pulling at iv lines, states "I need to get out of here". Pt reoriented to situation. Pt immediately calms.

## 2018-09-11 NOTE — ED Notes (Signed)
Critical lactic of 3.1 called from lab. Dr. Burlene Arnt notified, no new verbal orders received.

## 2018-09-11 NOTE — ED Provider Notes (Addendum)
[name] Provider Note  ____________________________________________   I have reviewed the triage vital signs and the nursing notes. Where available I have reviewed prior notes and, if possible and indicated, outside hospital notes.    HISTORY  Chief Complaint Code Sepsis    HPI [name] is a 85 y.o. male  Who presents today complaining of fever.  Patient is not giving me a good history.  He is awake guarding his airway but it is unclear exactly what happened prior to his arrival.  According to EMS, he became acutely ill over the last hour.  Review of prior notes shows that the patient does have a history of extended spectrum beta-lactamase E. coli bacteremia in October.  He denies cough.  Sats are in the mid to high 90s for [place] here.  He denies abdominal pain fever or diarrhea or vomiting.  Very limited history however.  Per EMS he is full code.  Vitals and sugar were reassuring for EMS.  Did have a fever 102 for them.  Level 5 chart caveat; no further history available due to patient status.  ----------------------------------------- 10:37 PM on 09/11/2018 -----------------------------------------  Per family he was doing ok till five when he said that he did not feel well. Started having shaking and felt bad. No vomiting no cough no other localized sx.  Has biliary stent but no sx with that.  Pt is DNR per family. Daughter Seeley Hissong  Past Medical History:  Diagnosis Date  . Anxiety   . Back problem 1960  . BPH (benign prostatic hypertrophy)   . Cervical radiculopathy    with neuropathy  . Dyspnea   . GERD (gastroesophageal reflux disease)   . HOH (hard of hearing)   . Hypertension   . Hypokalemia   . Melanoma in situ ([place]) 12/2016   neck  . Pancreatic adenocarcinoma [facility])    per patient D-I-L [name] , dx after last ercp, first oncology appt on 08-26-2018 at [facility]    Patient Active Problem List    Diagnosis Date Noted  . Pancreatic adenocarcinoma ([place]) 08/24/2018  . BPH (benign prostatic hyperplasia) 04/23/2018  . Obstructive jaundice 03/28/2018  . Tachycardia 03/24/2018  . Malnutrition of moderate degree 03/14/2018  . Pancreatitis due to biliary obstruction 03/12/2018  . Malnutrition of mild degree ([place]) 01/06/2018  . Melanoma of neck ([place])   . Rash 09/05/2016  . Neuropathy 05/07/2016  . Anemia 05/11/2015  . Preventative health care 04/29/2014  . Advanced directives, counseling/discussion 04/29/2014  . Osteoarthrosis involving multiple sites 10/31/2010  . Osteoarthrosis of knee 10/31/2010  . ACTINIC KERATOSIS 12/04/2007  . BPH with obstruction/lower urinary tract symptoms 10/01/2006  . Essential hypertension, benign 09/17/2006  . GERD 09/17/2006  . Cervical radiculopathy 09/17/2006    Past Surgical History:  Procedure Laterality Date  . BILIARY STENT PLACEMENT  03/30/2018   Procedure: BILIARY STENT PLACEMENT;  Surgeon: Clarene Essex, MD;  Location[facility] END[facility]COPY;  Service: Endoscopy;;  . BILIARY STENT PLACEMENT N/A 08/27/2018   Procedure: BILIARY STENT PLACEMENT;  Surgeon: Clarene Essex, MD;  Location: [facility] ENDOSCOPY;  Service: Endoscopy;  Laterality: N/A;  . CATARACT EXTRACTION     OD  . CHOLECYSTECTOMY N/A 01/26/2018   Procedure: LAPAROSCOPIC CHOLECYSTECTOMY;  Surgeon: Coralie Keens, MD;  Location: [place];  Service: General;  Laterality: N/A;  . ENDOSCOPIC RETROGRADE CHOLANGIOPANCREATOGRAPHY (ERCP) WITH PROPOFOL N/A 03/13/2018   Procedure: ENDOSCOPIC RETROGRADE CHOLANGIOPANCREATOGRAPHY (ERCP) WITH PROPOFOL;  Surgeon: Lucilla Lame, MD;  Location: [facility]  ENDOSCOPY;  Service: Endoscopy;  Laterality: N/A;  . ERCP N/A 03/29/2018   Procedure: ENDOSCOPIC RETROGRADE CHOLANGIOPANCREATOGRAPHY (ERCP);  Surgeon: Ronnette Juniper, MD;  Location: [place];  Service: Gastroenterology;  Laterality: N/A;  . ERCP N/A 03/30/2018   Procedure: ENDOSCOPIC RETROGRADE CHOLANGIOPANCREATOGRAPHY (ERCP);   Surgeon: Clarene Essex, MD;  Location: [place];  Service: Endoscopy;  Laterality: N/A;  . ERCP N/A 08/10/2018   Procedure: ENDOSCOPIC RETROGRADE CHOLANGIOPANCREATOGRAPHY (ERCP);  Surgeon: Clarene Essex, MD;  Location: [facility] ENDOSCOPY;  Service: Endoscopy;  Laterality: N/A;  Balloon sweep for stones  . ERCP N/A 08/27/2018   Procedure: ENDOSCOPIC RETROGRADE CHOLANGIOPANCREATOGRAPHY (ERCP) with STENT PLACEMENT;  Surgeon: Clarene Essex, MD;  Location: [facility] ENDOSCOPY;  Service: Endoscopy;  Laterality: N/A;  . IR PERC CHOLECYSTOSTOMY  12/28/2017  . LAPAROSCOPIC CHOLECYSTECTOMY  01/26/2018  . MELANOMA EXCISION  12/2016   in situ---Dr Dasher  . SPHINCTEROTOMY  03/30/2018   Procedure: SPHINCTEROTOMY;  Surgeon: Clarene Essex, MD;  Location: [facility] ENDOSCOPY;  Service: Endoscopy;;  . STENT REMOVAL  03/29/2018   Procedure: STENT REMOVAL;  Surgeon: Ronnette Juniper, MD;  Location: [facility] ENDOSCOPY;  Service: Gastroenterology;;  . Lavell Islam REMOVAL  08/10/2018   Procedure: STENT REMOVAL;  Surgeon: Clarene Essex, MD;  Location: [facility] ENDOSCOPY;  Service: Endoscopy;;  . TONSILLECTOMY      Prior to Admission medications   Medication Sig Start Date End Date Taking? Authorizing Provider  feeding supplement, ENSURE ENLIVE, (ENSURE ENLIVE) LIQD Take 6 mLs by mouth 2 (two) times daily between meals. Patient taking differently: Take 237 mLs by mouth daily.  03/17/18   Loletha Grayer, MD  ferrous sulfate 325 (65 FE) MG EC tablet Take 325 mg by mouth daily with breakfast.    [provider]  gabapentin (NEURONTIN) 600 MG tablet Take 1 tablet (600 mg total) by mouth every morning. And 2 at bedtime Patient taking differently: Take 600 mg by mouth 2 (two) times daily.  06/04/18   [name], MD  metoprolol succinate (TOPROL-XL) 25 MG 24 hr tablet TAKE ONE TABLET BY MOUTH DAILY 05/28/18   [name], MD  Multiple Vitamin (MULTIVITAMIN WITH MINERALS) TABS tablet Take 1 tablet by mouth daily. 03/17/18   Loletha Grayer, MD   omeprazole (PRILOSEC) 20 MG capsule Take 20 mg by mouth every morning.    [provider]  pramipexole (MIRAPEX) 0.125 MG tablet Take 1 tablet (0.125 mg total) by mouth at bedtime as needed. Patient taking differently: Take 0.125 mg by mouth at bedtime as needed (restless legs).  06/18/18   [name], MD  Probiotic Product (ALIGN) 4 MG CAPS Take 4 mg by mouth daily.    [provider]  tamsulosin (FLOMAX) 0.4 MG CAPS capsule Take 1 capsule (0.4 mg total) by mouth daily. Patient taking differently: Take 0.4 mg by mouth 2 (two) times daily.  06/26/18   [name], MD  traMADol (ULTRAM) 50 MG tablet Take 1 tablet (50 mg total) by mouth every 12 (twelve) hours as needed for moderate pain. 09/02/18   Owens Shark, NP  vitamin B-12 (CYANOCOBALAMIN) 500 MCG tablet Take 500 mcg by mouth daily.    [provider]    Allergies Patient has no known allergies.  Family History  Problem Relation Age of Onset  . Cancer Son   . Coronary artery disease Neg Hx   . Diabetes Neg Hx     Social History Social History   Tobacco Use  . Smoking status: Never Smoker  . Smokeless tobacco:  Never Used  Substance Use Topics  . Alcohol use: No  . Drug use: No    Review of Systems Level 5 chart caveat; no further history available due to patient status.  See HPI   ____________________________________________   PHYSICAL EXAM:  VITAL SIGNS: ED Triage Vitals  Enc Vitals Group     BP 09/11/18 2151 (!) 159/66     Pulse Rate 09/11/18 2148 (!) 144     Resp 09/11/18 2148 (!) 30     Temp 09/11/18 2206 (!) 105.8 F (41 C)     Temp Source 09/11/18 2148 Rectal     SpO2 --      Weight 09/11/18 2149 123 lb 7.3 oz (56 kg)     Height 09/11/18 2149 5\' 10"  (1.778 m)     Head Circumference --      Peak Flow --      Pain Score --      Pain Loc --      Pain Edu? --      Excl. in [place]? --     Constitutional: Alert ill-appearing feels warm Eyes: Conjunctivae are  normal Head: Atraumatic HEENT: No congestion/rhinnorhea. Mucous membranes are moist.  Oropharynx non-erythematous Neck:   Nontender with no meningismus, no masses, no stridor Cardiovascular: Tachycardia noted. Grossly normal heart sounds.  Good peripheral circulation. Respiratory: Normal respiratory effort.  No retractions.  Initial in the bases no rales or rhonchi tachypnea noted Abdominal: Soft and nontender to the extent that I can determine. No distention. No guarding no rebound Back:  There is no focal tenderness or step off.  there is no midline tenderness there are no lesions noted. there is no CVA tenderness Musculoskeletal: No lower extremity tenderness, no upper extremity tenderness. No joint effusions, no DVT signs strong distal pulses no edema Neurologic:  Normal speech and language. No gross focal neurologic deficits are appreciated.  Skin:  Skin is warm, dry and intact. No rash noted. Psychiatric: Mood and affect are normal. Speech and behavior are normal.  ____________________________________________   LABS (all labs ordered are listed, but only abnormal results are displayed)  Labs Reviewed  CULTURE, BLOOD (ROUTINE X 2)  CULTURE, BLOOD (ROUTINE X 2)  URINE CULTURE  SARS CORONAVIRUS 2 (HOSPITAL ORDER, PERFORMED IN [place] LAB)  LACTIC ACID, PLASMA  LACTIC ACID, PLASMA  COMPREHENSIVE METABOLIC PANEL  CBC WITH DIFFERENTIAL/PLATELET  URINALYSIS, ROUTINE W REFLEX MICROSCOPIC  BLOOD GAS, VENOUS  PROCALCITONIN  APTT  PROTIME-INR  URINALYSIS, COMPLETE (UACMP) WITH MICROSCOPIC  TROPONIN I  INFLUENZA PANEL BY PCR (TYPE A & B)    Pertinent labs  results that were available during my care of the patient were reviewed by me and considered in my medical decision making (see chart for details). ____________________________________________  EKG  I personally interpreted any EKGs ordered by me or  triage  ____________________________________________  RADIOLOGY  Pertinent labs & imaging results that were available during my care of the patient were reviewed by me and considered in my medical decision making (see chart for details). If possible, patient and/or family made aware of any abnormal findings.  No results found. ____________________________________________    PROCEDURES  Procedure(s) performed: None  Procedures  Critical Care performed: CRITICAL CARE Performed by: Schuyler Amor   Total critical care time: 55 minutes  Critical care time was exclusive of separately billable procedures and treating other patients.  Critical care was necessary to treat or prevent imminent or life-threatening deterioration.  Critical care was time  spent personally by me on the following activities: development of treatment plan with patient and/or surrogate as well as nursing, discussions with consultants, evaluation of patient's response to treatment, examination of patient, obtaining history from patient or surrogate, ordering and performing treatments and interventions, ordering and review of laboratory studies, ordering and review of radiographic studies, pulse oximetry and re-evaluation of patient's condition.   ____________________________________________   INITIAL IMPRESSION / ASSESSMENT AND PLAN / ED COURSE  Pertinent labs & imaging results that were available during my care of the patient were reviewed by me and considered in my medical decision making (see chart for details).  Patient likely bacteremic at least clinically appears to be so. unclear source. has been bacteremic before. giving antibiotics after consultation with pharmacy and review of prior cultures, which hopefully will cover him.  He is DNR/DNI.  Chest x-ray is reassuring multiple blood work pending at this time we will see if we can get him admitted, signed out to Dr. Beather Arbour at the end of my shift     ____________________________________________   FINAL CLINICAL IMPRESSION(S) / ED DIAGNOSES  Final diagnoses:  None      This chart was dictated using voice recognition software.  Despite best efforts to proofread,  errors can occur which can change meaning.      Schuyler Amor, MD 09/11/18 2258    Schuyler Amor, MD 09/12/18 [id number]

## 2018-09-11 NOTE — ED Notes (Addendum)
Pt reoriented to situation mulitple times, states he does not believe his temperature is high. Cooling measures in place, waiting on cooling blanket and cooling water circulating machine from in house.

## 2018-09-11 NOTE — ED Notes (Signed)
Dr. Burlene Arnt notified of temp 105.8.

## 2018-09-11 NOTE — ED Provider Notes (Signed)
-----------------------------------------   11:58 PM on 09/11/2018 -----------------------------------------  Discussed with hospitalist Dr. Sidney Ace and updated with negative COVID results.  He is at bedside to evaluate the patient for admission.   Paulette Blanch, MD 09/12/18 0130

## 2018-09-12 ENCOUNTER — Inpatient Hospital Stay: Payer: Medicare Other

## 2018-09-12 ENCOUNTER — Inpatient Hospital Stay
Admit: 2018-09-12 | Discharge: 2018-09-12 | Disposition: A | Discharge status: Home / Self Care | Payer: Medicare Other | Attending: Family Medicine | Admitting: Family Medicine

## 2018-09-12 DIAGNOSIS — Z20828 Contact with and (suspected) exposure to other viral communicable diseases: Secondary | ICD-10-CM | POA: Diagnosis present

## 2018-09-12 DIAGNOSIS — A419 Sepsis, unspecified organism: Secondary | ICD-10-CM | POA: Diagnosis present

## 2018-09-12 DIAGNOSIS — I10 Essential (primary) hypertension: Secondary | ICD-10-CM | POA: Diagnosis present

## 2018-09-12 DIAGNOSIS — R509 Fever, unspecified: Secondary | ICD-10-CM | POA: Diagnosis present

## 2018-09-12 DIAGNOSIS — Z681 Body mass index (BMI) 19 or less, adult: Secondary | ICD-10-CM | POA: Diagnosis not present

## 2018-09-12 DIAGNOSIS — R011 Cardiac murmur, unspecified: Secondary | ICD-10-CM | POA: Diagnosis present

## 2018-09-12 DIAGNOSIS — L8990 Pressure ulcer of unspecified site, unspecified stage: Secondary | ICD-10-CM

## 2018-09-12 DIAGNOSIS — N400 Enlarged prostate without lower urinary tract symptoms: Secondary | ICD-10-CM | POA: Diagnosis present

## 2018-09-12 DIAGNOSIS — E46 Unspecified protein-calorie malnutrition: Secondary | ICD-10-CM | POA: Diagnosis present

## 2018-09-12 DIAGNOSIS — R0902 Hypoxemia: Secondary | ICD-10-CM | POA: Diagnosis present

## 2018-09-12 DIAGNOSIS — A4159 Other Gram-negative sepsis: Secondary | ICD-10-CM | POA: Diagnosis present

## 2018-09-12 DIAGNOSIS — C259 Malignant neoplasm of pancreas, unspecified: Secondary | ICD-10-CM | POA: Diagnosis present

## 2018-09-12 DIAGNOSIS — Z8619 Personal history of other infectious and parasitic diseases: Secondary | ICD-10-CM | POA: Diagnosis not present

## 2018-09-12 DIAGNOSIS — Z7189 Other specified counseling: Secondary | ICD-10-CM | POA: Diagnosis not present

## 2018-09-12 DIAGNOSIS — H9190 Unspecified hearing loss, unspecified ear: Secondary | ICD-10-CM | POA: Diagnosis present

## 2018-09-12 DIAGNOSIS — B961 Klebsiella pneumoniae [K. pneumoniae] as the cause of diseases classified elsewhere: Secondary | ICD-10-CM | POA: Diagnosis present

## 2018-09-12 DIAGNOSIS — M5412 Radiculopathy, cervical region: Secondary | ICD-10-CM | POA: Diagnosis present

## 2018-09-12 DIAGNOSIS — Z66 Do not resuscitate: Secondary | ICD-10-CM | POA: Diagnosis present

## 2018-09-12 DIAGNOSIS — Z515 Encounter for palliative care: Secondary | ICD-10-CM | POA: Diagnosis not present

## 2018-09-12 DIAGNOSIS — G629 Polyneuropathy, unspecified: Secondary | ICD-10-CM | POA: Diagnosis present

## 2018-09-12 DIAGNOSIS — E876 Hypokalemia: Secondary | ICD-10-CM | POA: Diagnosis present

## 2018-09-12 DIAGNOSIS — Z85820 Personal history of malignant melanoma of skin: Secondary | ICD-10-CM | POA: Diagnosis not present

## 2018-09-12 DIAGNOSIS — D61818 Other pancytopenia: Secondary | ICD-10-CM | POA: Diagnosis present

## 2018-09-12 DIAGNOSIS — K219 Gastro-esophageal reflux disease without esophagitis: Secondary | ICD-10-CM | POA: Diagnosis present

## 2018-09-12 DIAGNOSIS — J9811 Atelectasis: Secondary | ICD-10-CM | POA: Diagnosis present

## 2018-09-12 DIAGNOSIS — K831 Obstruction of bile duct: Secondary | ICD-10-CM | POA: Diagnosis present

## 2018-09-12 DIAGNOSIS — R748 Abnormal levels of other serum enzymes: Secondary | ICD-10-CM | POA: Diagnosis present

## 2018-09-12 DIAGNOSIS — L89151 Pressure ulcer of sacral region, stage 1: Secondary | ICD-10-CM | POA: Diagnosis present

## 2018-09-12 DIAGNOSIS — Z86006 Personal history of melanoma in-situ: Secondary | ICD-10-CM | POA: Diagnosis not present

## 2018-09-12 LAB — COMPREHENSIVE METABOLIC PANEL
ALT: 86 U/L — ABNORMAL HIGH (ref 0–44)
AST: 150 U/L — ABNORMAL HIGH (ref 15–41)
Albumin: 1.9 g/dL — ABNORMAL LOW (ref 3.5–5.0)
Alkaline Phosphatase: 388 U/L — ABNORMAL HIGH (ref 38–126)
Anion gap: 6 (ref 5–15)
BUN: 9 mg/dL (ref 8–23)
CO2: 22 mmol/L (ref 22–32)
Calcium: 7.1 mg/dL — ABNORMAL LOW (ref 8.9–10.3)
Chloride: 109 mmol/L (ref 98–111)
Creatinine, Ser: 0.71 mg/dL (ref 0.61–1.24)
GFR calc Af Amer: >60 mL/min (ref >60)
GFR calc non Af Amer: >60 mL/min (ref >60)
Glucose, Bld: 104 mg/dL — ABNORMAL HIGH (ref 70–99)
Potassium: 3.5 mmol/L (ref 3.5–5.1)
Sodium: 137 mmol/L (ref 135–145)
Total Bilirubin: 2.6 mg/dL — ABNORMAL HIGH (ref 0.3–1.2)
Total Protein: 4 g/dL — ABNORMAL LOW (ref 6.5–8.1)

## 2018-09-12 LAB — BLOOD CULTURE ID PANEL (REFLEXED)

## 2018-09-12 LAB — CBC
HCT: 22.2 % — ABNORMAL LOW (ref 39.0–52.0)
Hemoglobin: 7.3 g/dL — ABNORMAL LOW (ref 13.0–17.0)
MCH: 35.3 pg — ABNORMAL HIGH (ref 26.0–34.0)
MCHC: 32.9 g/dL (ref 30.0–36.0)
MCV: 107.2 fL — ABNORMAL HIGH (ref 80.0–100.0)
Platelets: 98 10*3/uL — ABNORMAL LOW (ref 150–400)
RBC: 2.07 MIL/uL — ABNORMAL LOW (ref 4.22–5.81)
RDW: 18.1 % — ABNORMAL HIGH (ref 11.5–15.5)
WBC: 5.4 10*3/uL (ref 4.0–10.5)
nRBC: 0 % (ref 0.0–0.2)

## 2018-09-12 LAB — MAGNESIUM: Magnesium: 1.3 mg/dL — ABNORMAL LOW (ref 1.7–2.4)

## 2018-09-12 LAB — OCCULT BLOOD X 1 CARD TO LAB, STOOL: Fecal Occult Bld: NEGATIVE

## 2018-09-12 LAB — PROTIME-INR
INR: 1.6 — ABNORMAL HIGH (ref 0.8–1.2)
Prothrombin Time: 18.7 seconds — ABNORMAL HIGH (ref 11.4–15.2)

## 2018-09-12 LAB — CORTISOL-AM, BLOOD: Cortisol - AM: 15.1 ug/dL (ref 6.7–22.6)

## 2018-09-12 LAB — PREPARE RBC (CROSSMATCH)

## 2018-09-12 LAB — HEMOGLOBIN AND HEMATOCRIT, BLOOD
HCT: 21.2 % — ABNORMAL LOW (ref 39.0–52.0)
Hemoglobin: 7 g/dL — ABNORMAL LOW (ref 13.0–17.0)

## 2018-09-12 LAB — LACTIC ACID, PLASMA
Lactic Acid, Venous: 3.2 mmol/L (ref 0.5–1.9)
Lactic Acid, Venous: 2.1 mmol/L (ref 0.5–1.9)
Lactic Acid, Venous: 1.9 mmol/L (ref 0.5–1.9)

## 2018-09-12 LAB — PROCALCITONIN
Procalcitonin: 0.6 ng/mL
Procalcitonin: 5.84 ng/mL

## 2018-09-12 LAB — FIBRINOGEN: Fibrinogen: 249 mg/dL (ref 210–475)

## 2018-09-12 MED ORDER — PRAMIPEXOLE DIHYDROCHLORIDE 0.25 MG PO TABS
0.1250 mg | ORAL_TABLET | Freq: Every evening | ORAL | Status: DC | PRN
  Administered 2018-09-13: 0.125 mg via ORAL
  Filled 2018-09-12: qty 1

## 2018-09-12 MED ORDER — GABAPENTIN 300 MG PO CAPS
600.0000 mg | ORAL_CAPSULE | Freq: Two times a day (BID) | ORAL | Status: DC
  Administered 2018-09-12 – 2018-09-14 (×5): 600 mg via ORAL
  Filled 2018-09-12 (×5): qty 2

## 2018-09-12 MED ORDER — POTASSIUM CHLORIDE IN NACL 20-0.9 MEQ/L-% IV SOLN
INTRAVENOUS | Status: DC
  Administered 2018-09-12 – 2018-09-14 (×5): via INTRAVENOUS
  Filled 2018-09-12 (×9): qty 1000

## 2018-09-12 MED ORDER — KETOROLAC TROMETHAMINE 30 MG/ML IJ SOLN: 15.0000 mg | Freq: Four times a day (QID) | INTRAMUSCULAR | Status: DC | PRN

## 2018-09-12 MED ORDER — MAGNESIUM SULFATE 2 GM/50ML IV SOLN
2.0000 g | Freq: Once | INTRAVENOUS | Status: AC
  Administered 2018-09-12: 15:00:00 2 g via INTRAVENOUS
  Filled 2018-09-12: qty 50

## 2018-09-12 MED ORDER — TAMSULOSIN HCL 0.4 MG PO CAPS
0.4000 mg | ORAL_CAPSULE | Freq: Every day | ORAL | Status: DC
  Administered 2018-09-12 – 2018-09-14 (×3): 0.4 mg via ORAL
  Filled 2018-09-12 (×3): qty 1

## 2018-09-12 MED ORDER — SODIUM CHLORIDE 0.9 % IV SOLN: 2.0000 g | Freq: Once | INTRAVENOUS | Status: DC

## 2018-09-12 MED ORDER — ADULT MULTIVITAMIN W/MINERALS CH
1.0000 | ORAL_TABLET | Freq: Every day | ORAL | Status: DC
  Administered 2018-09-12 – 2018-09-14 (×3): 1 via ORAL
  Filled 2018-09-12 (×3): qty 1

## 2018-09-12 MED ORDER — MAGNESIUM HYDROXIDE 400 MG/5ML PO SUSP
30.0000 mL | Freq: Every day | ORAL | Status: DC | PRN
  Filled 2018-09-12: qty 30

## 2018-09-12 MED ORDER — TRAMADOL HCL 50 MG PO TABS
50.0000 mg | ORAL_TABLET | Freq: Two times a day (BID) | ORAL | Status: DC | PRN
  Administered 2018-09-12 – 2018-09-13 (×2): 50 mg via ORAL
  Filled 2018-09-12 (×2): qty 1

## 2018-09-12 MED ORDER — GABAPENTIN 600 MG PO TABS: 600.0000 mg | ORAL_TABLET | ORAL | Status: DC

## 2018-09-12 MED ORDER — PROMETHAZINE HCL 25 MG PO TABS
12.5000 mg | ORAL_TABLET | Freq: Four times a day (QID) | ORAL | Status: DC | PRN
  Filled 2018-09-12: qty 1

## 2018-09-12 MED ORDER — VANCOMYCIN HCL 10 G IV SOLR
1250.0000 mg | INTRAVENOUS | Status: DC
  Filled 2018-09-12: qty 1250

## 2018-09-12 MED ORDER — ENOXAPARIN SODIUM 40 MG/0.4ML ~~LOC~~ SOLN
40.0000 mg | SUBCUTANEOUS | Status: DC
  Administered 2018-09-12: 40 mg via SUBCUTANEOUS
  Filled 2018-09-12: qty 0.4

## 2018-09-12 MED ORDER — IOHEXOL 350 MG/ML SOLN
75.0000 mL | Freq: Once | INTRAVENOUS | Status: AC | PRN
  Administered 2018-09-12: 75 mL via INTRAVENOUS

## 2018-09-12 MED ORDER — METRONIDAZOLE IN NACL 5-0.79 MG/ML-% IV SOLN: 500.0000 mg | Freq: Three times a day (TID) | INTRAVENOUS | Status: DC

## 2018-09-12 MED ORDER — POTASSIUM CHLORIDE 20 MEQ PO PACK
40.0000 meq | PACK | Freq: Once | ORAL | Status: DC
  Filled 2018-09-12: qty 2

## 2018-09-12 MED ORDER — VANCOMYCIN HCL IN DEXTROSE 1-5 GM/200ML-% IV SOLN: 1000.0000 mg | Freq: Once | INTRAVENOUS | Status: DC

## 2018-09-12 MED ORDER — TRAZODONE HCL 50 MG PO TABS: 25.0000 mg | ORAL_TABLET | Freq: Every evening | ORAL | Status: DC | PRN

## 2018-09-12 MED ORDER — VITAMIN B-12 1000 MCG PO TABS
500.0000 ug | ORAL_TABLET | Freq: Every day | ORAL | Status: DC
  Administered 2018-09-12 – 2018-09-14 (×3): 500 ug via ORAL
  Filled 2018-09-12 (×3): qty 1

## 2018-09-12 MED ORDER — ENSURE ENLIVE PO LIQD
6.0000 mL | Freq: Two times a day (BID) | ORAL | Status: DC
  Administered 2018-09-12 – 2018-09-14 (×6): 6 mL via ORAL

## 2018-09-12 MED ORDER — SODIUM CHLORIDE 0.9 % IV BOLUS
500.0000 mL | Freq: Once | INTRAVENOUS | Status: AC
  Administered 2018-09-12: 01:00:00 500 mL via INTRAVENOUS

## 2018-09-12 MED ORDER — SODIUM CHLORIDE 0.9% IV SOLUTION
Freq: Once | INTRAVENOUS | Status: AC
  Administered 2018-09-12: 15:00:00 via INTRAVENOUS

## 2018-09-12 MED ORDER — PANTOPRAZOLE SODIUM 40 MG PO TBEC
40.0000 mg | DELAYED_RELEASE_TABLET | Freq: Every day | ORAL | Status: DC
  Administered 2018-09-12 – 2018-09-14 (×3): 40 mg via ORAL
  Filled 2018-09-12 (×3): qty 1

## 2018-09-12 MED ORDER — METOPROLOL SUCCINATE ER 50 MG PO TB24
25.0000 mg | ORAL_TABLET | Freq: Every day | ORAL | Status: DC
  Administered 2018-09-12: 10:00:00 25 mg via ORAL
  Filled 2018-09-12: qty 1

## 2018-09-12 MED ORDER — FERROUS SULFATE 325 (65 FE) MG PO TABS
325.0000 mg | ORAL_TABLET | Freq: Every day | ORAL | Status: DC
  Administered 2018-09-12 – 2018-09-14 (×3): 325 mg via ORAL
  Filled 2018-09-12 (×3): qty 1

## 2018-09-12 MED ORDER — RISAQUAD PO CAPS
1.0000 | ORAL_CAPSULE | Freq: Every day | ORAL | Status: DC
  Administered 2018-09-12 – 2018-09-14 (×3): 1 via ORAL
  Filled 2018-09-12 (×3): qty 1

## 2018-09-12 NOTE — ED Notes (Signed)
Lab notified of need for venipuncture assist for repeat lactic acid.

## 2018-09-12 NOTE — TOC Initial Note (Signed)
Transition of Care [facility]) - Initial/Assessment Note    Patient Details  Name: [name] MRN: [id number] Date of Birth: [birth year]  [facility]) CM/SW Contact:    Shelbie Hutching, RN Phone Number: 09/12/2018, 12:33 PM  Clinical Narrative:                 RNCM reached out to patient's son and daughter in law to explain CM role in plan of care.  Daughter in law reports that patient lives alone and is very independent.  Patient has a walker and cane and a wheelchair if needed.  Daughter in law and son provide transportation.  Patient is current with PCP and gets prescriptions at [facility], no trouble obtaining meds.  RNCM will cont to follow, no discharge needs at this time.   Expected Discharge Plan: Home/Self Care Barriers to Discharge: Continued Medical Work up   Patient Goals and CMS Choice        Expected Discharge Plan and Services Expected Discharge Plan: Home/Self Care   Discharge Planning Services: CM Consult   Living arrangements for the past 2 months: Single Family Home                                      Prior Living Arrangements/Services Living arrangements for the past 2 months: Single Family Home Lives with:: Self Patient language and need for interpreter reviewed:: Yes Do you feel safe going back to the place where you live?: Yes      Need for Family Participation in Patient Care: Yes (Comment) Care giver support system in place?: Yes (comment)(son and daughter in law live close by)   Criminal Activity/Legal Involvement Pertinent to Current Situation/Hospitalization: No - Comment as needed  Activities of Daily Living Home Assistive Devices/Equipment: Cane (specify quad or straight)    Permission Sought/Granted                  Emotional Assessment Appearance:: Appears stated age       Alcohol / Substance Use: Not Applicable Psych Involvement: No (comment)  Admission diagnosis:  Bacteremia [R78.81] Elevated  LFTs [R94.5] Fever [R50.9] Fever, unspecified fever cause [R50.9] Sepsis, due to unspecified organism, unspecified whether acute organ dysfunction present [facility]) [A41.9] Patient Active Problem List   Diagnosis Date Noted  . Sepsis ([place]) 09/12/2018  . Pressure injury of skin 09/12/2018  . Pancreatic adenocarcinoma ([place]) 08/24/2018  . BPH (benign prostatic hyperplasia) 04/23/2018  . Obstructive jaundice 03/28/2018  . Tachycardia 03/24/2018  . Malnutrition of moderate degree 03/14/2018  . Pancreatitis due to biliary obstruction 03/12/2018  . Malnutrition of mild degree ([place]) 01/06/2018  . Melanoma of neck ([place])   . Rash 09/05/2016  . Neuropathy 05/07/2016  . Anemia 05/11/2015  . Preventative health care 04/29/2014  . Advanced directives, counseling/discussion 04/29/2014  . Osteoarthrosis involving multiple sites 10/31/2010  . Osteoarthrosis of knee 10/31/2010  . ACTINIC KERATOSIS 12/04/2007  . BPH with obstruction/lower urinary tract symptoms 10/01/2006  . Essential hypertension, benign 09/17/2006  . GERD 09/17/2006  . Cervical radiculopathy 09/17/2006   PCP:  [name], MD Pharmacy:   [facility] [place] [address] Phone: [phone] Fax: [phone]     Social Determinants of Health (SDOH) Interventions    Readmission Risk Interventions No flowsheet data found.

## 2018-09-12 NOTE — ED Notes (Signed)
Pt's family member [name] updated on condition and results of labs with pt's permission.

## 2018-09-12 NOTE — Progress Notes (Signed)
Pharmacy Antibiotic Note  [name] is a 85 y.o. male admitted on 09/11/2018 with pneumonia.  Pharmacy has been consulted for vanc/meropenem dosing.  Plan: Patient received vanc 1.5g IV load and meropenem 1g IV x 1 in ED  Vancomycin 1250 mg IV Q 24 hrs. Goal AUC 400-550. Expected AUC: 551.8 SCr used: 0.78 Cssmin: 11.8 Will continue meropenem 1g IV q12h per CrCl 26 - 50 ml/min. Will continue to monitor for s/sx infx and defervescence of infx and renal function.  Height: 5\' 10"  (177.8 cm) Weight: 123 lb 7.3 oz (56 kg) IBW/kg (Calculated) : 73  Temp (24hrs), Avg:103.8 F (39.9 C), Min:102.4 F (39.1 C), Max:105.8 F (41 C)  Recent Labs  Lab 09/11/18 2210 09/11/18 2303 09/11/18 2304  WBC 3.9*  --   --   CREATININE  --   --  0.78  LATICACIDVEN  --  3.1*  --     Estimated Creatinine Clearance: 46.7 mL/min (by C-G formula based on SCr of 0.78 mg/dL).    No Known Allergies   Thank you for allowing pharmacy to be a part of this patient's care.  Tobie Lords, PharmD, BCPS Clinical Pharmacist 09/12/2018

## 2018-09-12 NOTE — Progress Notes (Signed)
PHARMACY - PHYSICIAN COMMUNICATION CRITICAL VALUE ALERT - BLOOD CULTURE IDENTIFICATION (BCID)  Jose [name] is an 85 y.o. male who presented to [facility] on 09/11/2018 with a chief complaint of fever and chills  Assessment:  1/4 aerobic bottle only Klebsiella pneumoniae (carbapenem resistance not detected) (include suspected source if known)  Name of physician (or Provider) ContactedMolli Hazard  Current antibiotics: Merrem + Vancomycin  Changes to prescribed antibiotics recommended:  Continue Merrem until susceptibilities and discontinue Vancomycin  Results for orders placed or performed during the hospital encounter of 09/11/18  Blood Culture ID Panel (Reflexed) (Collected: 09/11/2018 10:10 PM)  Result Value Ref Range   Enterococcus species NOT DETECTED NOT DETECTED   Listeria monocytogenes NOT DETECTED NOT DETECTED   Staphylococcus species NOT DETECTED NOT DETECTED   Staphylococcus aureus (BCID) NOT DETECTED NOT DETECTED   Streptococcus species NOT DETECTED NOT DETECTED   Streptococcus agalactiae NOT DETECTED NOT DETECTED   Streptococcus pneumoniae NOT DETECTED NOT DETECTED   Streptococcus pyogenes NOT DETECTED NOT DETECTED   Acinetobacter baumannii NOT DETECTED NOT DETECTED   Enterobacteriaceae species DETECTED (A) NOT DETECTED   Enterobacter cloacae complex NOT DETECTED NOT DETECTED   Escherichia coli NOT DETECTED NOT DETECTED   Klebsiella oxytoca NOT DETECTED NOT DETECTED   Klebsiella pneumoniae DETECTED (A) NOT DETECTED   Proteus species NOT DETECTED NOT DETECTED   Serratia marcescens NOT DETECTED NOT DETECTED   Carbapenem resistance NOT DETECTED NOT DETECTED   Haemophilus influenzae NOT DETECTED NOT DETECTED   Neisseria meningitidis NOT DETECTED NOT DETECTED   Pseudomonas aeruginosa NOT DETECTED NOT DETECTED   Candida albicans NOT DETECTED NOT DETECTED   Candida glabrata NOT DETECTED NOT DETECTED   Candida krusei NOT DETECTED NOT DETECTED   Candida parapsilosis  NOT DETECTED NOT DETECTED   Candida tropicalis NOT DETECTED NOT [place], PharmD Pharmacy Resident  09/12/2018 1:27 PM

## 2018-09-12 NOTE — Progress Notes (Signed)
CODE SEPSIS - PHARMACY COMMUNICATION  **Broad Spectrum Antibiotics should be administered within 1 hour of Sepsis diagnosis**  Time Code Sepsis Called/Page Received: 2155  Antibiotics Ordered: vanc/meropenem  Time of 1st antibiotic administration: 2227  Additional action taken by pharmacy:   If necessary, Name of Provider/Nurse Contacted:     Tobie Lords ,PharmD Clinical Pharmacist  09/12/2018  1:01 AM

## 2018-09-12 NOTE — ED Notes (Signed)
Cooling blanket initiated and set at 98.6.

## 2018-09-12 NOTE — H&P (Addendum)
[name] [place] NAME: Jose [name]    MR#:  [id number]  DATE OF BIRTH:  Oct 05, 1923  DATE OF ADMISSION:  09/11/2018  PRIMARY CARE PHYSICIAN: [name], MD   REQUESTING/REFERRING PHYSICIAN: Charlotte Crumb, MD CHIEF COMPLAINT:   Chief Complaint  Patient presents with  . Code Sepsis  High fever and chills  HISTORY OF PRESENT ILLNESS:  Jose [name]  is a 85 y.o. Caucasian male with a known history of multiple medical problems that will be mentioned below including pancreatic adenocarcinoma status post recent ERCP on 08/27/2018 and metal stent placement to the distal common bile duct for stricture with open prior sphincterotomy, who presented to the emergency room with acute onset of fever and chills.  Patient has not been feeling well today and within an hour of such complaint he was found on his front porch with Reiger's and chills.  He was noted to be febrile by EMS and his pulse oximetry was 92% on room air.  He was slightly confused and tachypneic.  He was initially thought to be highly suspicious for COVID-19.  He denied any nausea or vomiting or abdominal pain.  No dysuria, oliguria or hematuria or flank pain.  No cough or wheezing or dyspnea.  No recent travels or sick exposure.    Upon arrival to the emergency room, his blood pressure was 142/66, pulse 144 and respiratory to 24 and a temperature of 105.8 rectally with a oximetry of 88% on room air and 99% on 4 L of O2 by nasal cannula.  His pulse x-ray showed bibasilar atelectasis with no acute abnormalities otherwise.  Urinalysis was unremarkable and his CBC showed mild leukopenia of 3.9 with hemoglobin of 10.5 hematocrit of 31.3 and platelets 143, therefore consistent with mild pancytopenia.  His INR is 1.3 and PT 16.  His lactic acid came back elevated at 3.1.  Venous blood gas showed a pH 7.34, PCO2 46, PO2 of 60 O2 sat of 88.9% initially on room air.  EKG showed sinus tachycardia  with rate 147 with Q waves inferiorly.  His rapid COVID-19 test came back negative and his influenza a and B antigens came back negative as well.  The patient was given 1/2 L bolus of IV normal saline as well as IV meropenem and vancomycin.  He will be admitted to a medical monitored bed for further evaluation and management. PAST MEDICAL HISTORY:   Past Medical History:  Diagnosis Date  . Anxiety   . Back problem 1960  . BPH (benign prostatic hypertrophy)   . Cervical radiculopathy    with neuropathy  . Dyspnea   . GERD (gastroesophageal reflux disease)   . HOH (hard of hearing)   . Hypertension   . Hypokalemia   . Melanoma in situ ([place]) 12/2016   neck  . Pancreatic adenocarcinoma [facility])    per patient D-I-L [name] , dx after last ercp, first oncology appt on 08-26-2018 at [facility] center    PAST SURGICAL HISTORY:   Past Surgical History:  Procedure Laterality Date  . BILIARY STENT PLACEMENT  03/30/2018   Procedure: BILIARY STENT PLACEMENT;  Surgeon: Clarene Essex, MD;  Location: [facility] ENDOSCOPY;  Service: Endoscopy;;  . BILIARY STENT PLACEMENT N/A 08/27/2018   Procedure: BILIARY STENT PLACEMENT;  Surgeon: Clarene Essex, MD;  Location: [facility] ENDOSCOPY;  Service: Endoscopy;  Laterality: N/A;  . CATARACT EXTRACTION     OD  . CHOLECYSTECTOMY N/A 01/26/2018   Procedure: LAPAROSCOPIC  CHOLECYSTECTOMY;  Surgeon: Coralie Keens, MD;  Location: [facility];  Service: General;  Laterality: N/A;  . ENDOSCOPIC RETROGRADE CHOLANGIOPANCREATOGRAPHY (ERCP) WITH PROPOFOL N/A 03/13/2018   Procedure: ENDOSCOPIC RETROGRADE CHOLANGIOPANCREATOGRAPHY (ERCP) WITH PROPOFOL;  Surgeon: Lucilla Lame, MD;  Location: [facility] ENDOSCOPY;  Service: Endoscopy;  Laterality: N/A;  . ERCP N/A 03/29/2018   Procedure: ENDOSCOPIC RETROGRADE CHOLANGIOPANCREATOGRAPHY (ERCP);  Surgeon: Ronnette Juniper, MD;  Location: [facility];  Service: Gastroenterology;  Laterality: N/A;  . ERCP N/A 03/30/2018   Procedure: ENDOSCOPIC RETROGRADE  CHOLANGIOPANCREATOGRAPHY (ERCP);  Surgeon: Clarene Essex, MD;  Location: [facility];  Service: Endoscopy;  Laterality: N/A;  . ERCP N/A 08/10/2018   Procedure: ENDOSCOPIC RETROGRADE CHOLANGIOPANCREATOGRAPHY (ERCP);  Surgeon: Clarene Essex, MD;  Location: [facility] ENDOSCOPY;  Service: Endoscopy;  Laterality: N/A;  Balloon sweep for stones  . ERCP N/A 08/27/2018   Procedure: ENDOSCOPIC RETROGRADE CHOLANGIOPANCREATOGRAPHY (ERCP) with STENT PLACEMENT;  Surgeon: Clarene Essex, MD;  Location: [facility] ENDOSCOPY;  Service: Endoscopy;  Laterality: N/A;  . IR PERC CHOLECYSTOSTOMY  12/28/2017  . LAPAROSCOPIC CHOLECYSTECTOMY  01/26/2018  . MELANOMA EXCISION  12/2016   in situ---Dr Dasher  . SPHINCTEROTOMY  03/30/2018   Procedure: SPHINCTEROTOMY;  Surgeon: Clarene Essex, MD;  Location: [facility] ENDOSCOPY;  Service: Endoscopy;;  . STENT REMOVAL  03/29/2018   Procedure: STENT REMOVAL;  Surgeon: Ronnette Juniper, MD;  [facility] END[facility]COPY;  Service: Gastroenterology;;  . Lavell Islam REMOVAL  08/10/2018   Procedure: STENT REMOVAL;  Surgeon: Clarene Essex, MD;  Location: [facility] ENDOSCOPY;  Service: Endoscopy;;  . TONSILLECTOMY      SOCIAL HISTORY:   Social History   Tobacco Use  . Smoking status: Never Smoker  . Smokeless tobacco: Never Used  Substance Use Topics  . Alcohol use: No    FAMILY HISTORY:   Family History  Problem Relation Age of Onset  . Cancer Son   . Coronary artery disease Neg Hx   . Diabetes Neg Hx     DRUG ALLERGIES:  No Known Allergies  REVIEW OF SYSTEMS:   ROS As per history of present illness. All pertinent systems were reviewed above. Constitutional, HEENT, cardiovascular, respiratory, GI, GU, musculoskeletal, neuro, psychiatric, endocrine,integumentary and hematologic systems were reviewed and are otherwise negative/unremarkable except for positive findings mentioned above in the HPI.   MEDICATIONS AT HOME:   Prior to Admission medications   Medication Sig Start Date End Date Taking? Authorizing Provider   acetaminophen (TYLENOL) 325 MG tablet Take 650 mg by mouth every 6 (six) hours as needed.   Yes [provider]  feeding supplement, ENSURE ENLIVE, (ENSURE ENLIVE) LIQD Take 6 mLs by mouth 2 (two) times daily between meals. Patient taking differently: Take 237 mLs by mouth daily.  03/17/18  Yes [name], Richard, MD  ferrous sulfate 325 (65 FE) MG EC tablet Take 325 mg by mouth daily with breakfast.   Yes [provider]  gabapentin (NEURONTIN) 600 MG tablet Take 1 tablet (600 mg total) by mouth every morning. And 2 at bedtime Patient taking differently: Take 600 mg by mouth 2 (two) times daily.  06/04/18  Yes [name], MD  metoprolol succinate (TOPROL-XL) 25 MG 24 hr tablet TAKE ONE TABLET BY MOUTH DAILY 05/28/18  Yes [name], MD  Multiple Vitamin (MULTIVITAMIN WITH MINERALS) TABS tablet Take 1 tablet by mouth daily. 03/17/18  Yes [name], Richard, MD  omeprazole (PRILOSEC) 20 MG capsule Take 20 mg by mouth every morning.   Yes [provider]  pramipexole (MIRAPEX) 0.125 MG tablet Take 1  tablet (0.125 mg total) by mouth at bedtime as needed. Patient taking differently: Take 0.125 mg by mouth at bedtime as needed (restless legs).  06/18/18  Yes [name], MD  Probiotic Product (ALIGN) 4 MG CAPS Take 4 mg by mouth daily.   Yes [provider]  tamsulosin (FLOMAX) 0.4 MG CAPS capsule Take 1 capsule (0.4 mg total) by mouth daily. 06/26/18  Yes [name], MD  traMADol (ULTRAM) 50 MG tablet Take 1 tablet (50 mg total) by mouth every 12 (twelve) hours as needed for moderate pain. 09/02/18  Yes Owens Shark, NP  vitamin B-12 (CYANOCOBALAMIN) 500 MCG tablet Take 500 mcg by mouth daily.   Yes [provider]      VITAL SIGNS:  Blood pressure 115/64, pulse (!) 110, temperature (!) 102.6 F (39.2 C), resp. rate (!) 26, height 5\' 10"  (1.778 m), weight 56 kg, SpO2 100 %.  PHYSICAL EXAMINATION:  Physical Exam  GENERAL:  85  y.o.-year-old slim Caucasian male patient lying in the bed with no acute distress.  EYES: Pupils equal, round, reactive to light and accommodation. No scleral icterus. Extraocular muscles intact.  HEENT: Head atraumatic, normocephalic. Oropharynx and nasopharynx clear.  NECK:  Supple, no jugular venous distention. No thyroid enlargement, no tenderness.  LUNGS: Normal breath sounds bilaterally, no wheezing, rales,rhonchi or crepitation. No use of accessory muscles of respiration.  CARDIOVASCULAR: Regular rate and rhythm, S1, S2 normal. No murmurs, rubs, or gallops.  ABDOMEN: Soft, nondistended, nontender. Bowel sounds present. No organomegaly or mass.  EXTREMITIES: No pedal edema, cyanosis, or clubbing.  NEUROLOGIC: Cranial nerves II through XII are intact. Muscle strength 5/5 in all extremities. Sensation intact. Gait not checked.  PSYCHIATRIC: The patient is alert and oriented x 3.  Normal affect and good eye contact. SKIN: No obvious rash, lesion, or ulcer.   LABORATORY PANEL:   CBC Recent Labs  Lab 09/11/18 2210  WBC 3.9*  HGB 10.5*  HCT 31.3*  PLT 143*   ------------------------------------------------------------------------------------------------------------------  Chemistries  Recent Labs  Lab 09/11/18 2304  NA 137  K 3.2*  CL 111  CO2 19*  GLUCOSE 126*  BUN 9  CREATININE 0.78  CALCIUM 6.6*  AST 228*  ALT 91*  ALKPHOS 401*  BILITOT 2.2*   ------------------------------------------------------------------------------------------------------------------  Cardiac Enzymes Recent Labs  Lab 09/11/18 2304  TROPONINI <0.03   ------------------------------------------------------------------------------------------------------------------  RADIOLOGY:  Dg Chest Port 1 View  Result Date: 09/11/2018 CLINICAL DATA:  Shortness of breath EXAM: PORTABLE CHEST 1 VIEW COMPARISON:  04/26/2018 FINDINGS: Bibasilar atelectasis. No pleural effusion or pneumothorax. Calcific  aortic atherosclerosis. No focal consolidation. IMPRESSION: Bibasilar atelectasis without acute airspace disease. Electronically Signed   By: Ulyses Jarred M.D.   On: 09/11/2018 22:50      IMPRESSION AND PLAN:   #1.  High fever and suspected sepsis possibly bacteremia.  The patient has a previous history of ESBL bacteremia.   -He will be admitted to a medical monitored bed.   -We will continue antibiotic therapy with IV meropenem and vancomycin based on his previous culture in October 2019.  - We will obtain a right upper quadrant ultrasound given his slightly elevated LFTs.  - Will follow blood and urine cultures as well as obtain sputum culture and sensitivity. -Given his systolic murmur will obtain a 2D transthoracic echo.  If he has no source for his fever and does have bacteremia he may need TEE. -He will be placed on PRN antipyretics and cooling blanket as needed.  2.  Hypoxia with only bibasilar atelectasis on his chest x-ray.  We will obtain a stat chest CTA to rule out PE given hypercoagulability associated with pancreatic CA.  3.  Pancreatic adenocarcinoma status post ERCP and metal stent as well as prior sphincterotomy.  Pain management will be provided.  Right upper quadrant ultrasound was ordered as mentioned above.  4.  Hypokalemia.  Potassium will be replaced and magnesium level will be checked.  5.  Hypertension.  Toprol-XL will be resumed.  6.  GERD.  PPI therapy will be resumed.  7.  BPH.  Flomax will be resumed.  8.  Cervical radiculopathy with neuropathy.  Neurontin will be resumed.  9.  DVT prophylaxis.  Subcutaneous Lovenox will be provided.   All the records are reviewed and case discussed with ED provider. The plan of care was discussed in details with the patient and his daughter-in-law Ms. Cathy at [phone].  I answered all questions. The patient agreed to proceed with the above mentioned plan. Further management will depend upon hospital course.    CODE STATUS: DNR/DNI  TOTAL TIME TAKING CARE OF THIS PATIENT: 60 minutes.    Christel Mormon M.D on 09/12/2018 at 12:47 AM  Pager - [phone]  After 6pm go to [email or web address] - Proofreader  [facility]  Office  [phone]  CC: Primary care physician; [name], MD   Note: This dictation was prepared with Dragon dictation along with smaller phrase technology. Any transcriptional errors that result from this process are unintentional.

## 2018-09-12 NOTE — ED Notes (Signed)
Lab here for venipuncture.  

## 2018-09-12 NOTE — ED Notes (Addendum)
Temp 101.8 prior to transfer to floor. Cooling blanket in place. Pt's overalls, fleece shirt, white long john shirt and pants taken to floor by stephanie, ed tech.

## 2018-09-12 NOTE — Progress Notes (Signed)
[facility] [place] NAME: Jose [name]    MR#:  [id number]  DATE OF BIRTH:  06/17/1923  SUBJECTIVE:  CHIEF COMPLAINT:   Chief Complaint  Patient presents with  . Code Sepsis   Brought with sepsis.  Had drop in hemoglobin and received blood transfusion.  Patient has no complaint except for feeling very cold.  He appears somewhat confused.  REVIEW OF SYSTEMS:  CONSTITUTIONAL: No fever, fatigue or weakness.  EYES: No blurred or double vision.  EARS, NOSE, AND THROAT: No tinnitus or ear pain.  RESPIRATORY: No cough, shortness of breath, wheezing or hemoptysis.  CARDIOVASCULAR: No chest pain, orthopnea, edema.  GASTROINTESTINAL: No nausea, vomiting, diarrhea or abdominal pain.  GENITOURINARY: No dysuria, hematuria.  ENDOCRINE: No polyuria, nocturia,  HEMATOLOGY: No anemia, easy bruising or bleeding SKIN: No rash or lesion. MUSCULOSKELETAL: No joint pain or arthritis.   NEUROLOGIC: No tingling, numbness, weakness.  PSYCHIATRY: No anxiety or depression.   ROS  DRUG ALLERGIES:  No Known Allergies  VITALS:  Blood pressure (!) 91/56, pulse 94, temperature 98.6 F (37 C), temperature source Oral, resp. rate 16, height 5\' 10"  (1.778 m), weight 56 kg, SpO2 100 %.  PHYSICAL EXAMINATION:  GENERAL:  85 y.o.-year-old thin patient lying in the bed with no acute distress.  EYES: Pupils equal, round, reactive to light and accommodation. No scleral icterus. Extraocular muscles intact.  HEENT: Head atraumatic, normocephalic. Oropharynx and nasopharynx clear.  NECK:  Supple, no jugular venous distention. No thyroid enlargement, no tenderness.  LUNGS: Normal breath sounds bilaterally, no wheezing, rales,rhonchi or crepitation. No use of accessory muscles of respiration.  CARDIOVASCULAR: S1, S2 normal. No murmurs, rubs, or gallops.  ABDOMEN: Soft, mild tender, nondistended. Bowel sounds present. No organomegaly or mass.  EXTREMITIES: No pedal edema,  cyanosis, or clubbing.  NEUROLOGIC: Cranial nerves II through XII are intact. Muscle strength 3-4/5 in all extremities. Sensation intact. Gait not checked.  PSYCHIATRIC: The patient is alert and oriented x 1-2.  SKIN: No obvious rash, lesion, or ulcer.   Physical Exam LABORATORY PANEL:   CBC Recent Labs  Lab 09/12/18 0453 09/12/18 0614  WBC 5.4  --   HGB 7.3* 7.0*  HCT 22.2* 21.2*  PLT 98*  --    ------------------------------------------------------------------------------------------------------------------  Chemistries  Recent Labs  Lab 09/11/18 2304 09/12/18 0453  NA 137 137  K 3.2* 3.5  CL 111 109  CO2 19* 22  GLUCOSE 126* 104*  BUN 9 9  CREATININE 0.78 0.71  CALCIUM 6.6* 7.1*  MG 1.3*  --   AST 228* 150*  ALT 91* 86*  ALKPHOS 401* 388*  BILITOT 2.2* 2.6*   ------------------------------------------------------------------------------------------------------------------  Cardiac Enzymes Recent Labs  Lab 09/11/18 2304  TROPONINI <0.03   ------------------------------------------------------------------------------------------------------------------  RADIOLOGY:  Ct Angio Chest Pe W Or Wo Contrast  Result Date: 09/12/2018 CLINICAL DATA:  Fever with hypoxia. History of pancreatic adenocarcinoma. EXAM: CT ANGIOGRAPHY CHEST WITH CONTRAST TECHNIQUE: Multidetector CT imaging of the chest was performed using the standard protocol during bolus administration of intravenous contrast. Multiplanar CT image reconstructions and MIPs were obtained to evaluate the vascular anatomy. CONTRAST:  87mL OMNIPAQUE IOHEXOL 350 MG/ML SOLN COMPARISON:  None. FINDINGS: Cardiovascular: --Pulmonary arteries: Contrast injection is sufficient to demonstrate satisfactory opacification of the pulmonary arteries to the segmental level, with attenuation of 400 HU at the main pulmonary artery. There is no pulmonary embolus. The main pulmonary artery is within normal limits for size. --Aorta:  Satisfactory opacification of  the thoracic aorta. No aortic dissection or other acute aortic syndrome. Conventional 3 vessel aortic branching pattern. The aortic course and caliber are normal. There is mild aortic atherosclerosis. --Heart: Normal size. No pericardial effusion. Mediastinum/Nodes: No mediastinal, hilar or axillary lymphadenopathy. The visualized thyroid and thoracic esophageal course are unremarkable. Lungs/Pleura: Small pleural effusions with atelectasis. There are multiple small pulmonary nodules, measuring up to 6 mm. Upper Abdomen: Contrast bolus timing is not optimized for evaluation of the abdominal organs. There is pneumobilia with a metallic stents in the common bile duct. Limited visualization of known pancreatic adenocarcinoma. Musculoskeletal: No chest wall abnormality. No acute or significant osseous findings. Review of the MIP images confirms the above findings. IMPRESSION: 1. No pulmonary embolus or acute aortic syndrome. 2.  Aortic atherosclerosis (ICD10-I70.0). 3. Pneumobilia with metallic stent in the common bile duct. Electronically Signed   By: Ulyses Jarred M.D.   On: 09/12/2018 03:45   Dg Chest Port 1 View  Result Date: 09/11/2018 CLINICAL DATA:  Shortness of breath EXAM: PORTABLE CHEST 1 VIEW COMPARISON:  04/26/2018 FINDINGS: Bibasilar atelectasis. No pleural effusion or pneumothorax. Calcific aortic atherosclerosis. No focal consolidation. IMPRESSION: Bibasilar atelectasis without acute airspace disease. Electronically Signed   By: Ulyses Jarred M.D.   On: 09/11/2018 22:50   US Abdomen Limited Ruq  Result Date: 09/12/2018 CLINICAL DATA:  85 year old male with history of elevated liver function tests. Status post ERCP with placement of biliary stent on 08/27/2018. EXAM: ULTRASOUND ABDOMEN LIMITED RIGHT UPPER QUADRANT COMPARISON:  Abdominal ultrasound 03/12/2018. FINDINGS: Gallbladder: Status post cholecystectomy. Common bile duct: Diameter: 8.2 mm Liver: Mild diffuse  heterogeneous echotexture. No focal lesion identified. Within normal limits in parenchymal echogenicity. Portal vein is patent on color Doppler imaging with normal direction of blood flow towards the liver. IMPRESSION: 1. Mild diffuse heterogeneous echotexture throughout the hepatic parenchyma, nonspecific. 2. No common bile duct dilatation to suggest biliary tract obstruction. Electronically Signed   By: Vinnie Langton M.D.   On: 09/12/2018 10:18    ASSESSMENT AND PLAN:   Active Problems:   Sepsis ([place])   Pressure injury of skin  1.  High fever and  sepsis due to bacteremia.  The patient has a previous history of ESBL bacteremia.   -We will continue antibiotic therapy with IV meropenem based on his previous culture in October 2019.   Vancomycin stopped as initial identification from culture showing Enterobacter and Klebsiella. - No acute findings or ductal dilation in the right upper quadrant sonogram which was done due to slightly elevated LFTs.  Will follow serial LFTs daily. -Given his systolic murmur will obtain a 2D transthoracic echo.  -We might get ID consult on Monday once we have final identification on bacteria.  Mostly it is coming from the intra-abdominal procedure he had few weeks ago.  2.  Hypoxia with only bibasilar atelectasis on his chest x-ray.   No pulmonary embolism or other abnormalities identified on CT chest angiogram.  3.  Pancreatic adenocarcinoma status post ERCP and metal stent as well as prior sphincterotomy.  Pain management will be provided.  Right upper quadrant ultrasound as above. By oncologist, patient was advised to have no further interventions or chemotherapy but to consider palliative care.  4.  Hypokalemia.  Potassium replaced and magnesium level is low so giving IV.  5.  Hypertension.  Toprol-XL will be held due to sepsis.  6.  GERD.  PPI therapy resumed.  7.  BPH.  Flomax resumed.  8.  Cervical radiculopathy with neuropathy.  Neurontin  resumed.  9.  DVT prophylaxis.    No chemical prophylaxis due to drop in hemoglobin.  10.  Chronic anemia-acute drop due to possible blood loss    Baseline hemoglobin around 10 sudden drop up to 7. Hold anticoagulants, transfuse, check stool for guaiac.     All the records are reviewed and case discussed with Care Management/Social Workerr. Management plans discussed with the patient, family and they are in agreement.  CODE STATUS: DNR  TOTAL TIME TAKING CARE OF THIS PATIENT: 35 minutes.   Spoke to patient's daughter-in-law on the phone and updated about the plan.  POSSIBLE D/C IN 1-2 DAYS, DEPENDING ON CLINICAL CONDITION.   Vaughan Basta M.D on 09/12/2018   Between 7am to 6pm - Pager - [phone]  After 6pm go to [email or web address] - password [email or web address]  Office  [phone]  CC: Primary care physician; [name], MD  Note: This dictation was prepared with Dragon dictation along with smaller phrase technology. Any transcriptional errors that result from this process are unintentional.

## 2018-09-12 NOTE — Progress Notes (Signed)
Family Meeting Note  Advance Directive:yes  Today a meeting took place with the Daughter-in-law.  Patient is unable to participate due [id number] capacity Confusion   The following clinical team members were present during this meeting:MD  The following were discussed:Patient's diagnosis: Pancreatic cancer, GI bleed with anemia, bacteremia and sepsis, Patient's progosis: < 12 months and Goals for treatment: DNR  Patient had seen oncologist in last few weeks , who advised to involve delirium care.  Now presented with sepsis due to bacteremia.  I talked to his family on the phone due to policies of no in-hospital visitors to prevent COVID-19 spread. I discussed at length about his diagnosis of cancer and no significant treatment options for that as already cleared by oncologist in the past.  Now with the new finding of bacteremia and sepsis educated them about poor prognosis.  If his condition does not improve in next 1 to 2 days then we might call palliative care in the hospital to come in help with the goals of care. Additional follow-up to be provided: PMD and palliative care  Time spent during discussion:20 minutes  Vaughan Basta, MD

## 2018-09-12 NOTE — ED Notes (Signed)
This tech in to sit with pt for safety reasons

## 2018-09-12 NOTE — ED Notes (Signed)
ED TO INPATIENT HANDOFF REPORT  ED Nurse Name and Phone #: Lesha Jager [phone]   S Name/Age/Gender Jose [name] 85 y.o. male Room/Bed: ED06A/ED06A  Code Status   Code Status: DNR  Home/SNF/Other Home Patient oriented to: self Is this baseline? No   Triage Complete: Triage complete  Chief Complaint poss Sepsis  Triage Note Ems called for flu like symptoms. Pt arrives with tachycardia, pox on ra 92%, fsbs 147. Pt, per family symptoms began suddenly one hour pta. Pt with hot dry skin. Per ems pt with diminished lung sounds. Pt normally lives alone and is active.    Allergies No Known Allergies  Level of Care/Admitting Diagnosis ED Disposition    ED Disposition Condition [facility]: [place] [[id number]]  Level of Care: Med-Surg [16]  Covid Evaluation: N/A  Diagnosis: Sepsis [facility]) [[id number]]  Admitting Physician: Christel Mormon [[id number]]  Attending Physician: Christel Mormon [[id number]]  Estimated length of stay: 3 - 4 days  Certification:: I certify this patient will need inpatient services for at least 2 midnights  PT Class (Do Not Modify): Inpatient [101]  PT Acc Code (Do Not Modify): Private [1]       B Medical/Surgery History Past Medical History:  Diagnosis Date  . Anxiety   . Back problem 1960  . BPH (benign prostatic hypertrophy)   . Cervical radiculopathy    with neuropathy  . Dyspnea   . GERD (gastroesophageal reflux disease)   . HOH (hard of hearing)   . Hypertension   . Hypokalemia   . Melanoma in situ ([place]) 12/2016   neck  . Pancreatic adenocarcinoma [facility])    per patient D-I-L [name] , dx after last ercp, first oncology appt on 08-26-2018 at [facility] cancer [facility]   Past Surgical History:  Procedure Laterality Date  . BILIARY STENT PLACEMENT  03/30/2018   Procedure: BILIARY STENT PLACEMENT;  Surgeon: Clarene Essex, MD;  Location: [facility] ENDOSCOPY;  Service: Endoscopy;;  . BILIARY STENT PLACEMENT N/A 08/27/2018   Procedure:  BILIARY STENT PLACEMENT;  Surgeon: Clarene Essex, MD;  Location: [facility] ENDOSCOPY;  Service: Endoscopy;  Laterality: N/A;  . CATARACT EXTRACTION     OD  . CHOLECYSTECTOMY N/A 01/26/2018   Procedure: LAPAROSCOPIC CHOLECYSTECTOMY;  Surgeon: Coralie Keens, MD;  Location: [place];  Service: General;  Laterality: N/A;  . ENDOSCOPIC RETROGRADE CHOLANGIOPANCREATOGRAPHY (ERCP) WITH PROPOFOL N/A 03/13/2018   Procedure: ENDOSCOPIC RETROGRADE CHOLANGIOPANCREATOGRAPHY (ERCP) WITH PROPOFOL;  Surgeon: Lucilla Lame, MD;  Location: [facility] ENDOSCOPY;  Service: Endoscopy;  Laterality: N/A;  . ERCP N/A 03/29/2018   Procedure: ENDOSCOPIC RETROGRADE CHOLANGIOPANCREATOGRAPHY (ERCP);  Surgeon: Ronnette Juniper, MD;  Location: [place];  Service: Gastroenterology;  Laterality: N/A;  . ERCP N/A 03/30/2018   Procedure: ENDOSCOPIC RETROGRADE CHOLANGIOPANCREATOGRAPHY (ERCP);  Surgeon: Clarene Essex, MD;  Location: [place];  Service: Endoscopy;  Laterality: N/A;  . ERCP N/A 08/10/2018   Procedure: ENDOSCOPIC RETROGRADE CHOLANGIOPANCREATOGRAPHY (ERCP);  Surgeon: Clarene Essex, MD;  Location: [facility] ENDOS[facility];  Service: Endoscopy;  Laterality: N/A;  Balloon sweep for stones  . ERCP N/A 08/27/2018   Procedure: ENDOSCOPIC RETROGRADE CHOLANGIOPANCREATOGRAPHY (ERCP) with STENT PLACEMENT;  Surgeon: Clarene Essex, MD;  Location: [facility] ENDOSCOPY;  Service: Endoscopy;  Laterality: N/A;  . IR PERC CHOLECYSTOSTOMY  12/28/2017  . LAPAROSCOPIC CHOLECYSTECTOMY  01/26/2018  . MELANOMA EXCISION  12/2016   in situ---Dr Dasher  . SPHINCTEROTOMY  03/30/2018   Procedure: SPHINCTEROTOMY;  Surgeon: Clarene Essex, MD;  Location: [place];  Service: Endoscopy;;  .  STENT REMOVAL  03/29/2018   Procedure: STENT REMOVAL;  Surgeon: Ronnette Juniper, MD;  Location: [facility] ENDOSCOPY;  Service: Gastroenterology;;  . Lavell Islam REMOVAL  08/10/2018   Procedure: STENT REMOVAL;  Surgeon: Clarene Essex, MD;  Location: [facility] ENDOSCOPY;  Service: Endoscopy;;  . TONSILLECTOMY       A IV  Location/Drains/Wounds Patient Lines/Drains/Airways Status   Active Line/Drains/Airways    Name:   Placement date:   Placement time:   Site:   Days:   Peripheral IV 09/11/18 Left Hand   09/11/18    -    Hand   1   Peripheral IV 09/11/18 Right Wrist   09/11/18    2210    Wrist   1   Urethral Catheter Non-latex;Straight-tip   09/11/18    2231    Non-latex;Straight-tip   1   GI Stent 10 Fr.   08/27/18    1329    -   16          Intake/Output Last 24 hours  Intake/Output Summary (Last 24 hours) at 09/12/2018 0053 Last data filed at 09/11/2018 2359 Gross per 24 hour  Intake 1100 ml  Output -  Net 1100 ml    Labs/Imaging Results for orders placed or performed during the hospital encounter of 09/11/18 (from the past 48 hour(s))  CBC WITH DIFFERENTIAL     Status: Abnormal   Collection Time: 09/11/18 10:10 PM  Result Value Ref Range   WBC 3.9 (L) 4.0 - 10.5 K/uL   RBC 2.88 (L) 4.22 - 5.81 MIL/uL   Hemoglobin 10.5 (L) 13.0 - 17.0 g/dL   HCT 31.3 (L) 39.0 - 52.0 %   MCV 108.7 (H) 80.0 - 100.0 fL   MCH 36.5 (H) 26.0 - 34.0 pg   MCHC 33.5 30.0 - 36.0 g/dL   RDW 18.0 (H) 11.5 - 15.5 %   Platelets 143 (L) 150 - 400 K/uL   nRBC 0.0 0.0 - 0.2 %   Neutrophils Relative % 70 %   Neutro Abs 2.7 1.7 - 7.7 K/uL   Lymphocytes Relative 18 %   Lymphs Abs 0.7 0.7 - 4.0 K/uL   Monocytes Relative 7 %   Monocytes Absolute 0.3 0.1 - 1.0 K/uL   Eosinophils Relative 2 %   Eosinophils Absolute 0.1 0.0 - 0.5 K/uL   Basophils Relative 1 %   Basophils Absolute 0.0 0.0 - 0.1 K/uL   Immature Granulocytes 2 %   Abs Immature Granulocytes 0.06 0.00 - 0.07 K/uL    Comment: Performed at [facility], [address] [place], [place] [address]  APTT     Status: None   Collection Time: 09/11/18 10:10 PM  Result Value Ref Range   aPTT 27 24 - 36 seconds    Comment: Performed at [facility], [address] [place], [place] [address]  Protime-INR     Status: Abnormal   Collection Time:  09/11/18 10:10 PM  Result Value Ref Range   Prothrombin Time 16.0 (H) 11.4 - 15.2 seconds   INR 1.3 (H) 0.8 - 1.2    Comment: (NOTE) INR goal varies based on device and disease states. Performed at [facility], [address] [place], [place] [address]   Urinalysis, Complete w Microscopic     Status: Abnormal   Collection Time: 09/11/18 10:10 PM  Result Value Ref Range   Color, Urine YELLOW (A) YELLOW   APPearance CLEAR (A) CLEAR   Specific Gravity, Urine 1.010 1.005 - 1.030  pH 6.0 5.0 - 8.0   Glucose, UA NEGATIVE NEGATIVE mg/dL   Hgb urine dipstick NEGATIVE NEGATIVE   Bilirubin Urine NEGATIVE NEGATIVE   Ketones, ur NEGATIVE NEGATIVE mg/dL   Protein, ur NEGATIVE NEGATIVE mg/dL   Nitrite NEGATIVE NEGATIVE   Leukocytes,Ua NEGATIVE NEGATIVE   RBC / HPF 0-5 0 - 5 RBC/hpf   WBC, UA 0-5 0 - 5 WBC/hpf   Bacteria, UA NONE SEEN NONE SEEN   Squamous Epithelial / LPF NONE SEEN 0 - 5   Mucus PRESENT     Comment: Performed at [facility], [address], [place], [place] [address]  SARS Coronavirus 2 [facility] order, Performed in [place] hospital lab)     Status: None   Collection Time: 09/11/18 10:10 PM  Result Value Ref Range   SARS Coronavirus 2 NEGATIVE NEGATIVE    Comment: (NOTE) If result is NEGATIVE SARS-CoV-2 target nucleic acids are NOT DETECTED. The SARS-CoV-2 RNA is generally detectable in upper and lower  respiratory specimens during the acute phase of infection. The lowest  concentration of SARS-CoV-2 viral copies this assay can detect is 250  copies / mL. A negative result does not preclude SARS-CoV-2 infection  and should not be used as the sole basis for treatment or other  patient management decisions.  A negative result may occur with  improper specimen collection / handling, submission of specimen other  than nasopharyngeal swab, presence of viral mutation(s) within the  areas targeted by this assay, and inadequate number of viral copies   (<250 copies / mL). A negative result must be combined with clinical  observations, patient history, and epidemiological information. If result is POSITIVE SARS-CoV-2 target nucleic acids are DETECTED. The SARS-CoV-2 RNA is generally detectable in upper and lower  respiratory specimens dur ing the acute phase of infection.  Positive  results are indicative of active infection with SARS-CoV-2.  Clinical  correlation with patient history and other diagnostic information is  necessary to determine patient infection status.  Positive results do  not rule out bacterial infection or co-infection with other viruses. If result is PRESUMPTIVE POSTIVE SARS-CoV-2 nucleic acids MAY BE PRESENT.   A presumptive positive result was obtained on the submitted specimen  and confirmed on repeat testing.  While 2019 novel coronavirus  (SARS-CoV-2) nucleic acids may be present in the submitted sample  additional confirmatory testing may be necessary for epidemiological  and / or clinical management purposes  to differentiate between  SARS-CoV-2 and other Sarbecovirus currently known to infect humans.  If clinically indicated additional testing with an alternate test  methodology [phone]) is advised. The SARS-CoV-2 RNA is generally  detectable in upper and lower respiratory sp ecimens during the acute  phase of infection. The expected result is Negative. Fact Sheet for Patients:  StrictlyIdeas.no Fact Sheet for Healthcare Providers: BankingDealers.co.za This test is not yet approved or cleared by the [place] FDA and has been authorized for detection and/or diagnosis of SARS-CoV-2 by FDA under an Emergency Use Authorization (EUA).  This EUA will remain in effect (meaning this test can be used) for the duration of the COVID-19 declaration under Section 564(b)(1) of the Act, 21 U.S.C. section 360bbb-3(b)(1), unless the authorization is terminated  or revoked sooner. Performed at [facility], [address] [place],  [address]   Influenza panel by PCR (type A & B)     Status: None   Collection Time: 09/11/18 10:10 PM  Result Value Ref Range   Influenza  A By PCR NEGATIVE NEGATIVE   Influenza B By PCR NEGATIVE NEGATIVE    Comment: (NOTE) The Xpert Xpress Flu assay is intended as an aid in the diagnosis of  influenza and should not be used as a sole basis for treatment.  This  assay is FDA approved for nasopharyngeal swab specimens only. Nasal  washings and aspirates are unacceptable for Xpert Xpress Flu testing. Performed at [facility], [address] [place], [place] [address]   Lactic acid, plasma     Status: Abnormal   Collection Time: 09/11/18 11:03 PM  Result Value Ref Range   Lactic Acid, Venous 3.1 (HH) 0.5 - 1.9 mmol/L    Comment: CRITICAL RESULT CALLED TO, READ BACK BY AND VERIFIED WITH Raia Amico RN 09/11/2018 @ 6761 RDW Performed at [facility], [address] [place], [place] [address]   Blood gas, venous ([facility], [facility], [facility])     Status: Abnormal   Collection Time: 09/11/18 11:03 PM  Result Value Ref Range   pH, Ven 7.34 7.250 - 7.430   pCO2, Ven 46 44.0 - 60.0 mmHg   pO2, Ven 60.0 (H) 32.0 - 45.0 mmHg   Bicarbonate 24.8 20.0 - 28.0 mmol/L   Acid-base deficit 1.3 0.0 - 2.0 mmol/L   O2 Saturation 88.9 %   Patient temperature 37.0    Drawn by VENOUS    Sample type VENOUS     Comment: Performed at [facility], [address] [place], [place] [address]  Comprehensive metabolic panel     Status: Abnormal   Collection Time: 09/11/18 11:04 PM  Result Value Ref Range   Sodium 137 135 - 145 mmol/L   Potassium 3.2 (L) 3.5 - 5.1 mmol/L   Chloride 111 98 - 111 mmol/L   CO2 19 (L) 22 - 32 mmol/L   Glucose, Bld 126 (H) 70 - 99 mg/dL   BUN 9 8 - 23 mg/dL   Creatinine, Ser 0.78 0.61 - 1.24 mg/dL   Calcium 6.6 (L) 8.9 - 10.3 mg/dL   Total Protein 4.1 (L) 6.5 - 8.1  g/dL   Albumin 2.1 (L) 3.5 - 5.0 g/dL   AST 228 (H) 15 - 41 U/L   ALT 91 (H) 0 - 44 U/L   Alkaline Phosphatase 401 (H) 38 - 126 U/L   Total Bilirubin 2.2 (H) 0.3 - 1.2 mg/dL   GFR calc non Af Amer >60 >60 mL/min   GFR calc Af Amer >60 >60 mL/min   Anion gap 7 5 - 15    Comment: Performed at [facility], [place], [place] [address]  Procalcitonin     Status: None   Collection Time: 09/11/18 11:04 PM  Result Value Ref Range   Procalcitonin 0.60 ng/mL    Comment:        Interpretation: PCT > 0.5 ng/mL and <= 2 ng/mL: Systemic infection (sepsis) is possible, but other conditions are known to elevate PCT as well. (NOTE)       Sepsis PCT Algorithm           Lower Respiratory Tract                                      Infection PCT Algorithm    ----------------------------     ----------------------------         PCT < 0.25 ng/mL  PCT < 0.10 ng/mL         Strongly encourage             Strongly discourage   discontinuation of antibiotics    initiation of antibiotics    ----------------------------     -----------------------------       PCT 0.25 - 0.50 ng/mL            PCT 0.10 - 0.25 ng/mL               OR       >80% decrease in PCT            Discourage initiation of                                            antibiotics      Encourage discontinuation           of antibiotics    ----------------------------     -----------------------------         PCT >= 0.50 ng/mL              PCT 0.26 - 0.50 ng/mL                AND       <80% decrease in PCT             Encourage initiation of                                             antibiotics       Encourage continuation           of antibiotics    ----------------------------     -----------------------------        PCT >= 0.50 ng/mL                  PCT > 0.50 ng/mL               AND         increase in PCT                  Strongly encourage                                      initiation  of antibiotics    Strongly encourage escalation           of antibiotics                                     -----------------------------                                           PCT <= 0.25 ng/mL                                                 OR                                        >  80% decrease in PCT                                     Discontinue / Do not initiate                                             antibiotics Performed at [facility], [address] [place], [place] [address]   Troponin I -     Status: None   Collection Time: 09/11/18 11:04 PM  Result Value Ref Range   Troponin I <0.03 <0.03 ng/mL    Comment: Performed at [facility], [address] [place], [place] [address]   Dg Chest Port 1 View  Result Date: 09/11/2018 CLINICAL DATA:  Shortness of breath EXAM: PORTABLE CHEST 1 VIEW COMPARISON:  04/26/2018 FINDINGS: Bibasilar atelectasis. No pleural effusion or pneumothorax. Calcific aortic atherosclerosis. No focal consolidation. IMPRESSION: Bibasilar atelectasis without acute airspace disease. Electronically Signed   By: Ulyses Jarred M.D.   On: 09/11/2018 22:50    Pending Labs Unresulted Labs (From admission, onward)    Start     Ordered   09/12/18 0500  Protime-INR  Tomorrow morning,   STAT     09/12/18 0044   09/12/18 0500  Cortisol-am, blood  Tomorrow morning,   STAT     09/12/18 0044   09/12/18 0500  Procalcitonin  Tomorrow morning,   STAT     09/12/18 0044   09/12/18 0500  Comprehensive metabolic panel  Tomorrow morning,   STAT     09/12/18 0044   09/12/18 0500  CBC  Tomorrow morning,   STAT     09/12/18 0044   09/12/18 0200  Lactic acid, plasma  Once,   R     09/12/18 0200   09/12/18 0047  CULTURE, BLOOD (ROUTINE X 2) w Reflex to ID Panel  BLOOD CULTURE X 2,   STAT     09/12/18 0047   09/11/18 2153  Blood Culture (routine x 2)  BLOOD CULTURE X 2,   STAT    Question:  Patient immune status  Answer:  Normal   09/11/18  2153   09/11/18 2153  Urine culture  ONCE - STAT,   STAT    Question:  Patient immune status  Answer:  Normal   09/11/18 2153          Vitals/Pain Today's Vitals   09/12/18 0000 09/12/18 0015 09/12/18 0045 09/12/18 0051  BP: 115/64     Pulse:      Resp: (!) 26 (!) 26 (!) 21   Temp: (!) 102.7 F (39.3 C) (!) 102.6 F (39.2 C) (!) 102.4 F (39.1 C)   TempSrc:      SpO2:    96%  Weight:      Height:      PainSc:        Isolation Precautions Contact precautions  Medications Medications  vancomycin (VANCOCIN) 1,500 mg in sodium chloride 0.9 % 500 mL IVPB (1,500 mg Intravenous New Bag/Given 09/11/18 2306)  meropenem (MERREM) 1 g in sodium chloride 0.9 % 100 mL IVPB (0 g Intravenous Stopped 09/11/18 2257)  traMADol (ULTRAM) tablet 50 mg (has no administration in time range)  metoprolol succinate (TOPROL-XL) 24 hr tablet 25 mg (has no administration in time range)  pantoprazole (PROTONIX) EC tablet 40 mg (has no administration in time range)  Align CAPS 4 mg (has no administration in time range)  tamsulosin (FLOMAX) capsule 0.4 mg (has no administration in time range)  ferrous sulfate EC tablet 325 mg (has no administration in time range)  vitamin B-12 (CYANOCOBALAMIN) tablet 500 mcg (has no administration in time range)  gabapentin (NEURONTIN) tablet 600 mg (has no administration in time range)  pramipexole (MIRAPEX) tablet 0.125 mg (has no administration in time range)  feeding supplement (ENSURE ENLIVE) (ENSURE ENLIVE) liquid 6 mL (has no administration in time range)  multivitamin with minerals tablet 1 tablet (has no administration in time range)  enoxaparin (LOVENOX) injection 40 mg (has no administration in time range)  0.9 % NaCl with KCl 20 mEq/ L  infusion (has no administration in time range)  ceFEPIme (MAXIPIME) 2 g in sodium chloride 0.9 % 100 mL IVPB (has no administration in time range)  metroNIDAZOLE (FLAGYL) IVPB 500 mg (has no administration in time range)   vancomycin (VANCOCIN) IVPB 1000 mg/200 mL premix (has no administration in time range)  ketorolac (TORADOL) 15 MG/ML injection 15 mg (has no administration in time range)  traZODone (DESYREL) tablet 25 mg (has no administration in time range)  magnesium hydroxide (MILK OF MAGNESIA) suspension 30 mL (has no administration in time range)  promethazine (PHENERGAN) tablet 12.5 mg (has no administration in time range)  sodium chloride 0.9 % bolus 500 mL (500 mLs Intravenous New Bag/Given 09/11/18 2359)  acetaminophen (TYLENOL) tablet 650 mg (650 mg Oral Given 09/11/18 2208)  sodium chloride 0.9 % bolus 1,000 mL (0 mLs Intravenous Stopped 09/11/18 2359)  sodium chloride 0.9 % bolus 500 mL (500 mLs Intravenous New Bag/Given 09/12/18 0033)    Mobility walks with person assist High fall risk   Focused Assessments Cardiac Assessment Handoff:  Cardiac Rhythm: Sinus tachycardia Lab Results  Component Value Date   TROPONINI <0.03 09/11/2018   No results found for: DDIMER Does the Patient currently have chest pain? No  , Pulmonary Assessment Handoff:  Lung sounds:   O2 Device: Nasal Cannula O2 Flow Rate (L/min): 3 L/min      R Recommendations: See Admitting Provider Note  Report given to:   Additional Notes:

## 2018-09-13 LAB — TYPE AND SCREEN
ABO/RH(D): A NEG
Antibody Screen: NEGATIVE
Unit division: 0
Unit division: 0

## 2018-09-13 LAB — BPAM RBC
Blood Product Expiration Date: 202004252359
Blood Product Expiration Date: 202005072359
ISSUE DATE / TIME: 202004251442
ISSUE DATE / TIME: 202004252110
Unit Type and Rh: 600
Unit Type and Rh: 9500

## 2018-09-13 LAB — PROTIME-INR
INR: 1.4 — ABNORMAL HIGH (ref 0.8–1.2)
Prothrombin Time: 17.3 seconds — ABNORMAL HIGH (ref 11.4–15.2)

## 2018-09-13 LAB — COMPREHENSIVE METABOLIC PANEL
ALT: 65 U/L — ABNORMAL HIGH (ref 0–44)
AST: 65 U/L — ABNORMAL HIGH (ref 15–41)
Albumin: 2 g/dL — ABNORMAL LOW (ref 3.5–5.0)
Alkaline Phosphatase: 358 U/L — ABNORMAL HIGH (ref 38–126)
Anion gap: 3 — ABNORMAL LOW (ref 5–15)
BUN: 14 mg/dL (ref 8–23)
CO2: 22 mmol/L (ref 22–32)
Calcium: 7.1 mg/dL — ABNORMAL LOW (ref 8.9–10.3)
Chloride: 110 mmol/L (ref 98–111)
Creatinine, Ser: 0.7 mg/dL (ref 0.61–1.24)
GFR calc Af Amer: >60 mL/min (ref >60)
GFR calc non Af Amer: >60 mL/min (ref >60)
Glucose, Bld: 117 mg/dL — ABNORMAL HIGH (ref 70–99)
Potassium: 4.2 mmol/L (ref 3.5–5.1)
Sodium: 135 mmol/L (ref 135–145)
Total Bilirubin: 8.9 mg/dL — ABNORMAL HIGH (ref 0.3–1.2)
Total Protein: 4.2 g/dL — ABNORMAL LOW (ref 6.5–8.1)

## 2018-09-13 LAB — CBC
HCT: 28.1 % — ABNORMAL LOW (ref 39.0–52.0)
Hemoglobin: 9.6 g/dL — ABNORMAL LOW (ref 13.0–17.0)
MCH: 33.6 pg (ref 26.0–34.0)
MCHC: 34.2 g/dL (ref 30.0–36.0)
MCV: 98.3 fL (ref 80.0–100.0)
Platelets: 95 10*3/uL — ABNORMAL LOW (ref 150–400)
RBC: 2.86 MIL/uL — ABNORMAL LOW (ref 4.22–5.81)
RDW: 23.6 % — ABNORMAL HIGH (ref 11.5–15.5)
WBC: 6 10*3/uL (ref 4.0–10.5)
nRBC: 0 % (ref 0.0–0.2)

## 2018-09-13 LAB — URINE CULTURE
Culture: NO GROWTH
Special Requests: NORMAL

## 2018-09-13 MED ORDER — ENOXAPARIN SODIUM 40 MG/0.4ML ~~LOC~~ SOLN
40.0000 mg | SUBCUTANEOUS | Status: DC
  Administered 2018-09-13: 40 mg via SUBCUTANEOUS
  Filled 2018-09-13: qty 0.4

## 2018-09-13 NOTE — Evaluation (Signed)
Physical Therapy Evaluation Patient Details Name: Jose [name] MRN: [id number] DOB: 1923-06-01 Today's Date: 09/13/2018   History of Present Illness  Jose [name] is a 85yo male who comes to [facility] on 4/24, found on porch with rigors and fever, admitted with sepsis 2/2 ESBL bactremia. Covid Negative. PMH: HTN, dyspnea, cervical radiculopathy, ECRP, billary stent.   Clinical Impression  Pt admitted with above diagnosis. Pt currently with functional limitations due to the deficits listed below (see "PT Problem List"). Upon entry, pt in bed, awake and agreeable to participate. The pt is alert and oriented x3, pleasant, conversational but tangential, and not a very good historian. Pt immediately moves to EOB upon entry then to standing, all without explanation, then trips over his foley tube before being asked his intentions- tube intact, pt lands in sitting position on EOB. Pt reaching for items in room for stabilization, with 2-3 LOB, including while washing hands at sink. Pt given a RW for 151ft AMB where his gait appears more safe and without instability. Functional mobility assessment demonstrates poor safety awareness, limited awareness of deficits, fair tolerance, and need for supervision for safety. PLOF is unclear at this time. Communication is facilitated with written text at times when patient's [facility] interferes. Pt will benefit from skilled PT intervention to increase independence and safety with basic mobility in preparation for discharge to the venue listed below.       Follow Up Recommendations Supervision/Assistance - 24 hour;Home health PT;Supervision for mobility/OOB    Equipment Recommendations  None recommended by PT    Recommendations for Other Services       Precautions / Restrictions Precautions Precautions: Fall Precaution Comments: *pt is impulsive and unsafe Restrictions Weight Bearing Restrictions: No      Mobility  Bed Mobility Overal bed mobility:  Independent                Transfers Overall transfer level: Independent Equipment used: None;Rolling walker (2 wheeled)             General transfer comment: much safe with RW use; 3 transfers without all with almost immediate LOB and need for external objects for stabilization   Ambulation/Gait Ambulation/Gait assistance: Supervision;Min guard Gait Distance (Feet): 160 Feet Assistive device: Rolling walker (2 wheeled)       General Gait Details: ready to 'rest' after AMB in room. no LOB with turns. RW improves stability substantially. Pt attends to foley tube on RW frame.   Stairs            Wheelchair Mobility    Modified Rankin (Stroke Patients Only)       Balance Overall balance assessment: History of Falls;Needs assistance Sitting-balance support: No upper extremity supported;Feet supported Sitting balance-Leahy Scale: Good     Standing balance support: Single extremity supported;During functional activity Standing balance-Leahy Scale: Poor Standing balance comment: multiple LOB during basic mobility in room.                              Pertinent Vitals/Pain Pain Assessment: No/denies pain    Home Living Family/patient expects to be discharged to:: Private residence Living Arrangements: Alone Available Help at Discharge: Family(DIL on one side and son on the other) Type of Home: House Home Access: Ramped entrance     Home Layout: One level Home Equipment: Walker - 2 wheels Additional Comments: have 24/7 care in the home per note from admission 4MA    Prior Function Level  of Independence: Needs assistance   Gait / Transfers Assistance Needed: walks with RW at night for toiletting   ADL's / Homemaking Assistance Needed: Pt mod I with ADLs- family completed IADLs  Comments: Ambulates with SPC at baseline, occasionally drives but most often needs assistance/supervision with IADLs.  Recently using walker     Hand Dominance    Dominant Hand: Right    Extremity/Trunk Assessment   Upper Extremity Assessment Upper Extremity Assessment: Overall WFL for tasks assessed    Lower Extremity Assessment Lower Extremity Assessment: Generalized weakness;Overall WFL for tasks assessed       Communication   Communication: HOH  Cognition Arousal/Alertness: Awake/alert Behavior During Therapy: Impulsive;Agitated(very low on agitation scale, but a little hypervigilant, loud with large body language and facial expressions. ) Overall Cognitive Status: Difficult to assess                                 General Comments: impulsive, poor awareness of deficits, inappropriate response to multiple LOB in session.       General Comments      Exercises     Assessment/Plan    PT Assessment Patient needs continued PT services  PT Problem List Decreased activity tolerance;Decreased balance;Decreased mobility;Decreased cognition;Decreased coordination;Decreased knowledge of use of DME;Decreased safety awareness;Decreased knowledge of precautions       PT Treatment Interventions DME instruction;Therapeutic exercise;Stair training;Functional mobility training;Therapeutic activities;Balance training;Patient/family education;Cognitive remediation    PT Goals (Current goals can be found in the Care Plan section)  Acute Rehab PT Goals Patient Stated Goal: wants to go home to his dog  PT Goal Formulation: With patient Time For Goal Achievement: 09/27/18 Potential to Achieve Goals: Good    Frequency Min 2X/week   Barriers to discharge        Co-evaluation               AM-PAC PT "6 Clicks" Mobility  Outcome Measure Help needed turning from your back to your side while in a flat bed without using bedrails?: A Little Help needed moving from lying on your back to sitting on the side of a flat bed without using bedrails?: A Little Help needed moving to and from a bed to a chair (including a  wheelchair)?: A Little Help needed standing up from a chair using your arms (e.g., wheelchair or bedside chair)?: A Little Help needed to walk in hospital room?: A Little Help needed climbing 3-5 steps with a railing? : A Little 6 Click Score: 18    End of Session Equipment Utilized During Treatment: Gait belt Activity Tolerance: Patient limited by fatigue Patient left: in chair;with chair alarm set;with call bell/phone within reach Nurse Communication: Mobility status PT Visit Diagnosis: Unsteadiness on feet (R26.81);Other abnormalities of gait and mobility (R26.89);Difficulty in walking, not elsewhere classified (R26.2)    Time: 7035-0093 PT Time Calculation (min) (ACUTE ONLY): 30 min   Charges:   PT Evaluation $PT Eval Moderate Complexity: 1 Mod PT Treatments $Therapeutic Exercise: 8-22 mins       3:09 PM, 09/13/18 Etta Grandchild, PT, DPT Physical Therapist - [facility]  [phone] ([place])    [place] 09/13/2018, 3:05 PM

## 2018-09-13 NOTE — Plan of Care (Signed)

## 2018-09-13 NOTE — Consult Note (Signed)
[facility] GI Inpatient Consult Note   Consultant: Kathline Magic, M.D.  Reason for Consult: Sepsis, presumptive biliary obstruction   Attending Requesting Consult: Vaughan Basta, MD   History of Present Illness: Jose [name] is a 85 y.o. male with a history of unresectable pancreatic adenocarcinoma.  He has undergone about 4 ERCPs in the past year for stent placement and removal.  His last ERCP was performed by Dr. Lizbeth Bark at [facility] on 08/27/2018. An uncovered distal bile duct metal stent was placed at that time with apparent good bile flow and no complications.  Patient is currently admitted with sepsis and elevated liver enzymes suggesting recurrent cholestasis. Patient denies abdominal pain but seems to feel weak. GI service has been contacted to make recommendations in the setting of DNR status, reported recommendations for palliative care by patient's oncologist, and desire of family to pursue other avenues short of strict palliative care options. A family conference discussing Hospice care is slated for tomorrow.  The patient appears tearful given his current depressed mood and  States that he 'just want to go home'. The patient seems to carte blanche agree with any procedures or tests that will hurry his discharge from the hospital.  Past Medical History:  Past Medical History:  Diagnosis Date  . Anxiety   . Back problem 1960  . BPH (benign prostatic hypertrophy)   . Cervical radiculopathy    with neuropathy  . Dyspnea   . GERD (gastroesophageal reflux disease)   . HOH (hard of hearing)   . Hypertension   . Hypokalemia   . Melanoma in situ ([place]) 12/2016   neck  . Pancreatic adenocarcinoma [facility])    per patient D-I-L [name] , dx after last ercp, first oncology appt on 08-26-2018 at [facility]    Problem List: Patient Active Problem List   Diagnosis Date Noted  . Sepsis ([place]) 09/12/2018  . Pressure injury of skin  09/12/2018  . Pancreatic adenocarcinoma ([place]) 08/24/2018  . BPH (benign prostatic hyperplasia) 04/23/2018  . Obstructive jaundice 03/28/2018  . Tachycardia 03/24/2018  . Malnutrition of moderate degree 03/14/2018  . Pancreatitis due to biliary obstruction 03/12/2018  . Malnutrition of mild degree ([place]) 01/06/2018  . Melanoma of neck ([place])   . Rash 09/05/2016  . Neuropathy 05/07/2016  . Anemia 05/11/2015  . Preventative health care 04/29/2014  . Advanced directives, counseling/discussion 04/29/2014  . Osteoarthrosis involving multiple sites 10/31/2010  . Osteoarthrosis of knee 10/31/2010  . ACTINIC KERATOSIS 12/04/2007  . BPH with obstruction/lower urinary tract symptoms 10/01/2006  . Essential hypertension, benign 09/17/2006  . GERD 09/17/2006  . Cervical radiculopathy 09/17/2006    Past Surgical History: Past Surgical History:  Procedure Laterality Date  . BILIARY STENT PLACEMENT  03/30/2018   Procedure: BILIARY STENT PLACEMENT;  Surgeon: Clarene Essex, MD;  Location: [facility] ENDOSCOPY;  Service: Endoscopy;;  . BILIARY STENT PLACEMENT N/A 08/27/2018   Procedure: BILIARY STENT PLACEMENT;  Surgeon: Clarene Essex, MD;  Location: [facility] ENDOSCOPY;  Service: Endoscopy;  Laterality: N/A;  . CATARACT EXTRACTION     OD  . CHOLECYSTECTOMY N/A 01/26/2018   Procedure: LAPAROSCOPIC CHOLECYSTECTOMY;  Surgeon: Coralie Keens, MD;  Location: [place];  Service: General;  Laterality: N/A;  . ENDOSCOPIC RETROGRADE CHOLANGIOPANCREATOGRAPHY (ERCP) WITH PROPOFOL N/A 03/13/2018   Procedure: ENDOSCOPIC RETROGRADE CHOLANGIOPANCREATOGRAPHY (ERCP) WITH PROPOFOL;  Surgeon: Lucilla Lame, MD;  Location: [facility] ENDOSCOPY;  Service: Endoscopy;  Laterality: N/A;  . ERCP N/A 03/29/2018   Procedure: ENDOSCOPIC RETROGRADE CHOLANGIOPANCREATOGRAPHY (  ERCP);  Surgeon: Ronnette Juniper, MD;  Location: [place];  Service: Gastroenterology;  Laterality: N/A;  . ERCP N/A 03/30/2018   Procedure: ENDOSCOPIC RETROGRADE  CHOLANGIOPANCREATOGRAPHY (ERCP);  Surgeon: Clarene Essex, MD;  Location: [place];  Service: Endoscopy;  Laterality: N/A;  . ERCP N/A 08/10/2018   Procedure: ENDOSCOPIC RETROGRADE CHOLANGIOPANCREATOGRAPHY (ERCP);  Surgeon: Clarene Essex, MD;  Location: [facility] END[facility];  Service: Endoscopy;  Laterality: N/A;  Balloon sweep for stones  . ERCP N/A 08/27/2018   Procedure: ENDOSCOPIC RETROGRADE CHOLANGIOPANCREATOGRAPHY (ERCP) with STENT PLACEMENT;  Surgeon: Clarene Essex, MD;  Location: [facility] ENDOSCOPY;  Service: Endoscopy;  Laterality: N/A;  . IR PERC CHOLECYSTOSTOMY  12/28/2017  . LAPAROSCOPIC CHOLECYSTECTOMY  01/26/2018  . MELANOMA EXCISION  12/2016   in situ---Dr Dasher  . SPHINCTEROTOMY  03/30/2018   Procedure: SPHINCTEROTOMY;  Surgeon: Clarene Essex, MD;  Location[facility] ENDOSCOPY;  Service: Endoscopy;;  . STENT REMOVAL  03/29/2018   Procedure: STENT REMOVAL;  Surgeon: Ronnette Juniper, MD;  Location: [facility] ENDOSCOPY;  Service: Gastroenterology;;  . Lavell Islam REMOVAL  08/10/2018   Procedure: STENT REMOVAL;  Surgeon: Clarene Essex, MD;  Location: [facility] ENDOSCOPY;  Service: Endoscopy;;  . TONSILLECTOMY      Allergies: No Known Allergies  Home Medications: Medications Prior to Admission  Medication Sig Dispense Refill Last Dose  . acetaminophen (TYLENOL) 325 MG tablet Take 650 mg by mouth every 6 (six) hours as needed.   prn at prn  . feeding supplement, ENSURE ENLIVE, (ENSURE ENLIVE) LIQD Take 6 mLs by mouth 2 (two) times daily between meals. (Patient taking differently: Take 237 mLs by mouth daily. ) 60 Bottle 0 Unknown at Unknown  . ferrous sulfate 325 (65 FE) MG EC tablet Take 325 mg by mouth daily with breakfast.   09/11/2018 at 1000  . gabapentin (NEURONTIN) 600 MG tablet Take 1 tablet (600 mg total) by mouth every morning. And 2 at bedtime (Patient taking differently: Take 600 mg by mouth 2 (two) times daily. ) 180 tablet 3 09/11/2018 at 1900  . metoprolol succinate (TOPROL-XL) 25 MG 24 hr tablet TAKE ONE TABLET BY MOUTH  DAILY 30 tablet 11 09/11/2018 at 1000  . Multiple Vitamin (MULTIVITAMIN WITH MINERALS) TABS tablet Take 1 tablet by mouth daily. 30 tablet 0 09/11/2018 at 1000  . omeprazole (PRILOSEC) 20 MG capsule Take 20 mg by mouth every morning.   09/11/2018 at 1000  . pramipexole (MIRAPEX) 0.125 MG tablet Take 1 tablet (0.125 mg total) by mouth at bedtime as needed. (Patient taking differently: Take 0.125 mg by mouth at bedtime as needed (restless legs). ) 30 tablet 5 prn at prn  . Probiotic Product (ALIGN) 4 MG CAPS Take 4 mg by mouth daily.   09/11/2018 at 1000  . tamsulosin (FLOMAX) 0.4 MG CAPS capsule Take 1 capsule (0.4 mg total) by mouth daily. 90 capsule 3 09/11/2018 at 1000  . traMADol (ULTRAM) 50 MG tablet Take 1 tablet (50 mg total) by mouth every 12 (twelve) hours as needed for moderate pain. 30 tablet 0 prn at prn  . vitamin B-12 (CYANOCOBALAMIN) 500 MCG tablet Take 500 mcg by mouth daily.   09/11/2018 at [place] medication reconciliation was completed with the patient.   Scheduled Inpatient Medications:   . acidophilus  1 capsule Oral Daily  . enoxaparin (LOVENOX) injection  40 mg Subcutaneous Q24H  . feeding supplement (ENSURE ENLIVE)  6 mL Oral BID BM  . ferrous sulfate  325 mg Oral Q breakfast  . gabapentin  600  mg Oral BID  . multivitamin with minerals  1 tablet Oral Daily  . pantoprazole  40 mg Oral Daily  . potassium chloride  40 mEq Oral Once  . tamsulosin  0.4 mg Oral Daily  . vitamin B-12  500 mcg Oral Daily    Continuous Inpatient Infusions:   . 0.9 % NaCl with KCl 20 mEq / L 100 mL/hr at 09/13/18 1337  . meropenem (MERREM) IV Stopped (09/13/18 1331)    PRN Inpatient Medications:  ketorolac, magnesium hydroxide, pramipexole, promethazine, traMADol, traZODone  Family History: family history includes Cancer in his son.   GI Family History: Negative.   Social History:   reports that he has never smoked. He has never used smokeless tobacco. He reports that he does not  drink alcohol or use drugs. The patient denies ETOH, tobacco, or drug use.    Review of Systems: Review of Systems - History obtained from the patient General ROS: positive for  - fatigue, sleep disturbance and weight loss negative for - sleep disturbance Allergy and Immunology ROS: negative Hematological and Lymphatic ROS: negative for - bleeding problems, blood clots, bruising or jaundice Respiratory ROS: no cough, shortness of breath, or wheezing Cardiovascular ROS: no chest pain or dyspnea on exertion Genito-Urinary ROS: no dysuria, trouble voiding, or hematuria Musculoskeletal ROS: negative Neurological ROS: no TIA or stroke symptoms Dermatological ROS: negative  Physical Examination: BP 130/61 (BP Location: Right Arm)   Pulse 89   Temp 99.6 F (37.6 C) (Oral)   Resp 18   Ht 5\' 10"  (1.778 m)   Wt 56 kg   SpO2 91%   BMI 17.71 kg/m  Physical Exam Constitutional:      General: He is not in acute distress.    Appearance: Normal appearance. He is not toxic-appearing.  HENT:     Head: Normocephalic.  Eyes:     Conjunctiva/sclera: Conjunctivae normal.     Pupils: Pupils are equal, round, and reactive to light.  Cardiovascular:     Rate and Rhythm: Normal rate.  Pulmonary:     Effort: Pulmonary effort is normal.     Breath sounds: No rhonchi.  Chest:     Chest wall: No tenderness.  Abdominal:     General: Abdomen is flat. Bowel sounds are normal. There is no distension.     Palpations: There is no mass.     Tenderness: There is no abdominal tenderness.  Skin:    General: Skin is warm and dry.  Neurological:     Mental Status: He is alert.     Comments: Patient very hard of hearing  Psychiatric:     Comments: Somewhat depressed mood. Not suicidal. Answers questions appropriately.     Data: Lab Results  Component Value Date   WBC 6.0 09/13/2018   HGB 9.6 (L) 09/13/2018   HCT 28.1 (L) 09/13/2018   MCV 98.3 09/13/2018   PLT 95 (L) 09/13/2018   Recent Labs   Lab 09/12/18 0453 09/12/18 0614 09/13/18 0124  HGB 7.3* 7.0* 9.6*   Lab Results  Component Value Date   NA 135 09/13/2018   K 4.2 09/13/2018   CL 110 09/13/2018   CO2 22 09/13/2018   BUN 14 09/13/2018   CREATININE 0.70 09/13/2018   Lab Results  Component Value Date   ALT 65 (H) 09/13/2018   AST 65 (H) 09/13/2018   ALKPHOS 358 (H) 09/13/2018   BILITOT 8.9 (H) 09/13/2018   Recent Labs  Lab 09/11/18 2210  09/13/18 0813  APTT 27  --   --   INR 1.3*   < > 1.4*   < > = values in this interval not displayed.   CBC Latest Ref Rng & Units 09/13/2018 09/12/2018 09/12/2018  WBC 4.0 - 10.5 K/uL 6.0 - 5.4  Hemoglobin 13.0 - 17.0 g/dL 9.6(L) 7.0(L) 7.3(L)  Hematocrit 39.0 - 52.0 % 28.1(L) 21.2(L) 22.2(L)  Platelets 150 - 400 K/uL 95(L) - 98(L)    STUDIES: Ct Angio Chest Pe W Or Wo Contrast  Result Date: 09/12/2018 CLINICAL DATA:  Fever with hypoxia. History of pancreatic adenocarcinoma. EXAM: CT ANGIOGRAPHY CHEST WITH CONTRAST TECHNIQUE: Multidetector CT imaging of the chest was performed using the standard protocol during bolus administration of intravenous contrast. Multiplanar CT image reconstructions and MIPs were obtained to evaluate the vascular anatomy. CONTRAST:  48mL OMNIPAQUE IOHEXOL 350 MG/ML SOLN COMPARISON:  None. FINDINGS: Cardiovascular: --Pulmonary arteries: Contrast injection is sufficient to demonstrate satisfactory opacification of the pulmonary arteries to the segmental level, with attenuation of 400 HU at the main pulmonary artery. There is no pulmonary embolus. The main pulmonary artery is within normal limits for size. --Aorta: Satisfactory opacification of the thoracic aorta. No aortic dissection or other acute aortic syndrome. Conventional 3 vessel aortic branching pattern. The aortic course and caliber are normal. There is mild aortic atherosclerosis. --Heart: Normal size. No pericardial effusion. Mediastinum/Nodes: No mediastinal, hilar or axillary lymphadenopathy.  The visualized thyroid and thoracic esophageal course are unremarkable. Lungs/Pleura: Small pleural effusions with atelectasis. There are multiple small pulmonary nodules, measuring up to 6 mm. Upper Abdomen: Contrast bolus timing is not optimized for evaluation of the abdominal organs. There is pneumobilia with a metallic stents in the common bile duct. Limited visualization of known pancreatic adenocarcinoma. Musculoskeletal: No chest wall abnormality. No acute or significant osseous findings. Review of the MIP images confirms the above findings. IMPRESSION: 1. No pulmonary embolus or acute aortic syndrome. 2.  Aortic atherosclerosis (ICD10-I70.0). 3. Pneumobilia with metallic stent in the common bile duct. Electronically Signed   By: Ulyses Jarred M.D.   On: 09/12/2018 03:45   Dg Chest Port 1 View  Result Date: 09/11/2018 CLINICAL DATA:  Shortness of breath EXAM: PORTABLE CHEST 1 VIEW COMPARISON:  04/26/2018 FINDINGS: Bibasilar atelectasis. No pleural effusion or pneumothorax. Calcific aortic atherosclerosis. No focal consolidation. IMPRESSION: Bibasilar atelectasis without acute airspace disease. Electronically Signed   By: Ulyses Jarred M.D.   On: 09/11/2018 22:50   US Abdomen Limited Ruq  Result Date: 09/12/2018 CLINICAL DATA:  85 year old male with history of elevated liver function tests. Status post ERCP with placement of biliary stent on 08/27/2018. EXAM: ULTRASOUND ABDOMEN LIMITED RIGHT UPPER QUADRANT COMPARISON:  Abdominal ultrasound 03/12/2018. FINDINGS: Gallbladder: Status post cholecystectomy. Common bile duct: Diameter: 8.2 mm Liver: Mild diffuse heterogeneous echotexture. No focal lesion identified. Within normal limits in parenchymal echogenicity. Portal vein is patent on color Doppler imaging with normal direction of blood flow towards the liver. IMPRESSION: 1. Mild diffuse heterogeneous echotexture throughout the hepatic parenchyma, nonspecific. 2. No common bile duct dilatation to  suggest biliary tract obstruction. Electronically Signed   By: Vinnie Langton M.D.   On: 09/12/2018 10:18   @IMAGES @  Assessment: 1. Pancreatic adenocarcinoma - Sees oncologist Dr. Benay Spice at [facility]. Due to advanced age, no chemotherapy is advised. S/p ERCP with uncovered biliary metal stent placement by Dr. Clarene Essex in [place] on 08/27/2018.   2. Elevated liver enzymes - suggests recurrent extrahepatic biliary duct obstruction from tumour.  3. Hypoxia, currently stable.  4. Advanced age   78. Malnutrition  6. Sepsis - possibly from cholestasis. IV anbx started.   Recommendations:  1. May consider repeat ERCP with stent placement based upon results of family meeting with Hospice on 09/14/2018. Could possibly do here with Dr. Allen Norris who is scheduled for GI call tomorrow evening. IT would require Dr. Allen Norris, who has performed one of this patient's ERCP's in the past, to agree after evaluation of patient's risks. In some ways, ERCP is considered palliative care with some attendant risks bleeding, bowel perforation, pancreatitis (5-9%), cholangitis and/or death. Overall prognosis is poor. Even with repeat ERCP, tumour ingrowth into the stent will happen, and appears to have occurred less than three weeks after a palliative ERCP with stent placement.   2. May also consider Hospice care as previously considered.  3. Will follow along.  Thank you for the consult. Please call with questions or concerns.  Olean Ree, "Lanny Hurst MD [facility] [place], [place] [address] [phone]  09/13/2018 3:23 PM

## 2018-09-13 NOTE — Progress Notes (Signed)
[facility] NAME: Jose [name]    MR#:  [id number]  DATE OF BIRTH:  1923/07/05  SUBJECTIVE:  CHIEF COMPLAINT:   Chief Complaint  Patient presents with  . Code Sepsis   Brought with sepsis.  Received blood transfusion yesterday.  Feels completely fine and was asking me" when can I go home?"  Have no new complaints today.  REVIEW OF SYSTEMS:   CONSTITUTIONAL: No fever, fatigue or weakness.  EYES: No blurred or double vision.  EARS, NOSE, AND THROAT: No tinnitus or ear pain.  RESPIRATORY: No cough, shortness of breath, wheezing or hemoptysis.  CARDIOVASCULAR: No chest pain, orthopnea, edema.  GASTROINTESTINAL: No nausea, vomiting, diarrhea or abdominal pain.  GENITOURINARY: No dysuria, hematuria.  ENDOCRINE: No polyuria, nocturia,  HEMATOLOGY: No anemia, easy bruising or bleeding SKIN: No rash or lesion. MUSCULOSKELETAL: No joint pain or arthritis.   NEUROLOGIC: No tingling, numbness, weakness.  PSYCHIATRY: No anxiety or depression.   ROS  DRUG ALLERGIES:  No Known Allergies  VITALS:  Blood pressure 130/61, pulse 89, temperature 99.6 F (37.6 C), temperature source Oral, resp. rate 18, height 5\' 10"  (1.778 m), weight 56 kg, SpO2 91 %.  PHYSICAL EXAMINATION:  GENERAL:  85 y.o.-year-old thin patient lying in the bed with no acute distress.  EYES: Pupils equal, round, reactive to light and accommodation.  Positive for scleral icterus. Extraocular muscles intact.  HEENT: Head atraumatic, normocephalic. Oropharynx and nasopharynx clear.  NECK:  Supple, no jugular venous distention. No thyroid enlargement, no tenderness.  LUNGS: Normal breath sounds bilaterally, no wheezing, rales,rhonchi or crepitation. No use of accessory muscles of respiration.  CARDIOVASCULAR: S1, S2 normal. No murmurs, rubs, or gallops.  ABDOMEN: Soft, no tender, nondistended. Bowel sounds present. No organomegaly or mass.  EXTREMITIES: No pedal edema,  cyanosis, or clubbing.  NEUROLOGIC: Cranial nerves II through XII are intact. Muscle strength 3-4/5 in all extremities. Sensation intact. Gait not checked.  PSYCHIATRIC: The patient is alert and oriented x 3.  SKIN: No obvious rash, lesion, or ulcer.   Physical Exam LABORATORY PANEL:   CBC Recent Labs  Lab 09/13/18 0124  WBC 6.0  HGB 9.6*  HCT 28.1*  PLT 95*   ------------------------------------------------------------------------------------------------------------------  Chemistries  Recent Labs  Lab 09/11/18 2304  09/13/18 0124  NA 137   < > 135  K 3.2*   < > 4.2  CL 111   < > 110  CO2 19*   < > 22  GLUCOSE 126*   < > 117*  BUN 9   < > 14  CREATININE 0.78   < > 0.70  CALCIUM 6.6*   < > 7.1*  MG 1.3*  --   --   AST 228*   < > 65*  ALT 91*   < > 65*  ALKPHOS 401*   < > 358*  BILITOT 2.2*   < > 8.9*   < > = values in this interval not displayed.   ------------------------------------------------------------------------------------------------------------------  Cardiac Enzymes Recent Labs  Lab 09/11/18 2304  TROPONINI <0.03   ------------------------------------------------------------------------------------------------------------------  RADIOLOGY:  Ct Angio Chest Pe W Or Wo Contrast  Result Date: 09/12/2018 CLINICAL DATA:  Fever with hypoxia. History of pancreatic adenocarcinoma. EXAM: CT ANGIOGRAPHY CHEST WITH CONTRAST TECHNIQUE: Multidetector CT imaging of the chest was performed using the standard protocol during bolus administration of intravenous contrast. Multiplanar CT image reconstructions and MIPs were obtained to evaluate the vascular anatomy. CONTRAST:  57mL OMNIPAQUE IOHEXOL 350  MG/ML SOLN COMPARISON:  None. FINDINGS: Cardiovascular: --Pulmonary arteries: Contrast injection is sufficient to demonstrate satisfactory opacification of the pulmonary arteries to the segmental level, with attenuation of 400 HU at the main pulmonary artery. There is no  pulmonary embolus. The main pulmonary artery is within normal limits for size. --Aorta: Satisfactory opacification of the thoracic aorta. No aortic dissection or other acute aortic syndrome. Conventional 3 vessel aortic branching pattern. The aortic course and caliber are normal. There is mild aortic atherosclerosis. --Heart: Normal size. No pericardial effusion. Mediastinum/Nodes: No mediastinal, hilar or axillary lymphadenopathy. The visualized thyroid and thoracic esophageal course are unremarkable. Lungs/Pleura: Small pleural effusions with atelectasis. There are multiple small pulmonary nodules, measuring up to 6 mm. Upper Abdomen: Contrast bolus timing is not optimized for evaluation of the abdominal organs. There is pneumobilia with a metallic stents in the common bile duct. Limited visualization of known pancreatic adenocarcinoma. Musculoskeletal: No chest wall abnormality. No acute or significant osseous findings. Review of the MIP images confirms the above findings. IMPRESSION: 1. No pulmonary embolus or acute aortic syndrome. 2.  Aortic atherosclerosis (ICD10-I70.0). 3. Pneumobilia with metallic stent in the common bile duct. Electronically Signed   By: Ulyses Jarred M.D.   On: 09/12/2018 03:45   Dg Chest Port 1 View  Result Date: 09/11/2018 CLINICAL DATA:  Shortness of breath EXAM: PORTABLE CHEST 1 VIEW COMPARISON:  04/26/2018 FINDINGS: Bibasilar atelectasis. No pleural effusion or pneumothorax. Calcific aortic atherosclerosis. No focal consolidation. IMPRESSION: Bibasilar atelectasis without acute airspace disease. Electronically Signed   By: Ulyses Jarred M.D.   On: 09/11/2018 22:50   US Abdomen Limited Ruq  Result Date: 09/12/2018 CLINICAL DATA:  85 year old male with history of elevated liver function tests. Status post ERCP with placement of biliary stent on 08/27/2018. EXAM: ULTRASOUND ABDOMEN LIMITED RIGHT UPPER QUADRANT COMPARISON:  Abdominal ultrasound 03/12/2018. FINDINGS: Gallbladder:  Status post cholecystectomy. Common bile duct: Diameter: 8.2 mm Liver: Mild diffuse heterogeneous echotexture. No focal lesion identified. Within normal limits in parenchymal echogenicity. Portal vein is patent on color Doppler imaging with normal direction of blood flow towards the liver. IMPRESSION: 1. Mild diffuse heterogeneous echotexture throughout the hepatic parenchyma, nonspecific. 2. No common bile duct dilatation to suggest biliary tract obstruction. Electronically Signed   By: Vinnie Langton M.D.   On: 09/12/2018 10:18    ASSESSMENT AND PLAN:   Active Problems:   Sepsis ([place])   Pressure injury of skin  1.  High fever and  sepsis due to bacteremia.  The patient has a previous history of ESBL bacteremia.   -We will continue antibiotic therapy with IV meropenem based on his previous culture in October 2019.   Vancomycin stopped as initial identification from culture showing Enterobacter and Klebsiella. - No acute findings or ductal dilation in the right upper quadrant sonogram which was done due to slightly elevated LFTs.  Will follow serial LFTs daily.  Liver enzymes are coming down but bilirubin is still rising so called GI consult. -Given his systolic murmur  have obtain a 2D transthoracic echo.  - We might get ID consult on Monday once we have final identification on bacteria.  Mostly it is coming from the intra-abdominal procedure he had few weeks ago.  2.  Hypoxia with only bibasilar atelectasis on his chest x-ray.   No pulmonary embolism or other abnormalities identified on CT chest angiogram.  3.  Pancreatic adenocarcinoma status post ERCP and metal stent as well as prior sphincterotomy.  Pain management will be provided.  Right upper quadrant ultrasound as above. By oncologist, patient was advised to have no further interventions or chemotherapy but to consider palliative care.  4.  Hypokalemia.  Potassium replaced and magnesium level is low so giving IV.  5.   Hypertension.  Toprol-XL will be held due to sepsis.  6.  GERD.  PPI therapy resumed.  7.  BPH.  Flomax resumed.  8.  Cervical radiculopathy with neuropathy.  Neurontin resumed.  9.  DVT prophylaxis.    Stop Lovenox initially due to drop in hemoglobin but is guaiac is negative and hemoglobin came up will resume Lovenox.  10.  Chronic anemia-acute drop due to possible blood loss    Baseline hemoglobin around 10 sudden drop up to 7.  May be dilutional Hold anticoagulants, transfuse, check stool for guaiac- negative. Will resume Lovenox now.  85.  Jaundice-bilirubin is rising while other liver enzymes are gradually coming down. GI consult to discuss options and prognosis with family.    All the records are reviewed and case discussed with Care Management/Social Workerr. Management plans discussed with the patient, family and they are in agreement.  CODE STATUS: DNR  TOTAL TIME TAKING CARE OF THIS PATIENT: 35 minutes.   Spoke to patient's daughter-in-law on the phone and updated about the plan.  POSSIBLE D/C IN 1-2 DAYS, DEPENDING ON CLINICAL CONDITION.   Vaughan Basta M.D on 09/13/2018   Between 7am to 6pm - Pager - [phone]  After 6pm go to [email or web address] - password [email or web address] [facility]  Office  [phone]  CC: Primary care physician; [name], MD  Note: This dictation was prepared with Dragon dictation along with smaller phrase technology. Any transcriptional errors that result from this process are unintentional.

## 2018-09-14 ENCOUNTER — Other Ambulatory Visit: Payer: Self-pay | Admitting: Internal Medicine

## 2018-09-14 DIAGNOSIS — Z515 Encounter for palliative care: Secondary | ICD-10-CM

## 2018-09-14 DIAGNOSIS — Z7189 Other specified counseling: Secondary | ICD-10-CM

## 2018-09-14 LAB — ECHOCARDIOGRAM COMPLETE
Height: 70 in
Weight: 1975.32 oz

## 2018-09-14 LAB — COMPREHENSIVE METABOLIC PANEL
ALT: 45 U/L — ABNORMAL HIGH (ref 0–44)
AST: 33 U/L (ref 15–41)
Albumin: 2.2 g/dL — ABNORMAL LOW (ref 3.5–5.0)
Alkaline Phosphatase: 355 U/L — ABNORMAL HIGH (ref 38–126)
Anion gap: 4 — ABNORMAL LOW (ref 5–15)
BUN: 12 mg/dL (ref 8–23)
CO2: 22 mmol/L (ref 22–32)
Calcium: 7.5 mg/dL — ABNORMAL LOW (ref 8.9–10.3)
Chloride: 109 mmol/L (ref 98–111)
Creatinine, Ser: 0.66 mg/dL (ref 0.61–1.24)
GFR calc Af Amer: >60 mL/min (ref >60)
GFR calc non Af Amer: >60 mL/min (ref >60)
Glucose, Bld: 118 mg/dL — ABNORMAL HIGH (ref 70–99)
Potassium: 4.3 mmol/L (ref 3.5–5.1)
Sodium: 135 mmol/L (ref 135–145)
Total Bilirubin: 2.5 mg/dL — ABNORMAL HIGH (ref 0.3–1.2)
Total Protein: 4.4 g/dL — ABNORMAL LOW (ref 6.5–8.1)

## 2018-09-14 LAB — CULTURE, BLOOD (ROUTINE X 2)

## 2018-09-14 LAB — CBC
HCT: 29.6 % — ABNORMAL LOW (ref 39.0–52.0)
Hemoglobin: 10.2 g/dL — ABNORMAL LOW (ref 13.0–17.0)
MCH: 34.1 pg — ABNORMAL HIGH (ref 26.0–34.0)
MCHC: 34.5 g/dL (ref 30.0–36.0)
MCV: 99 fL (ref 80.0–100.0)
Platelets: 90 10*3/uL — ABNORMAL LOW (ref 150–400)
RBC: 2.99 MIL/uL — ABNORMAL LOW (ref 4.22–5.81)
RDW: 23.7 % — ABNORMAL HIGH (ref 11.5–15.5)
WBC: 4.3 10*3/uL (ref 4.0–10.5)
nRBC: 0 % (ref 0.0–0.2)

## 2018-09-14 MED ORDER — CIPROFLOXACIN HCL 500 MG PO TABS: 500.0000 mg | ORAL_TABLET | Freq: Two times a day (BID) | ORAL | 0 refills | Status: AC

## 2018-09-14 MED ORDER — METRONIDAZOLE 500 MG PO TABS: 500.0000 mg | ORAL_TABLET | Freq: Three times a day (TID) | ORAL | 0 refills | Status: AC

## 2018-09-14 NOTE — Discharge Summary (Signed)
[facility] [place] NAME: Jose [name]    MR#:  [id number]  DATE OF BIRTH:  1923/12/28  DATE OF ADMISSION:  09/11/2018 ADMITTING PHYSICIAN: Christel Mormon, MD  DATE OF DISCHARGE: 09/14/2018   PRIMARY CARE PHYSICIAN: [name], MD    ADMISSION DIAGNOSIS:  Bacteremia [R78.81] Elevated LFTs [R94.5] Fever [R50.9] Fever, unspecified fever cause [R50.9] Sepsis, due to unspecified organism, unspecified whether acute organ dysfunction present ([place]) [A41.9]  DISCHARGE DIAGNOSIS:  Active Problems:   Sepsis ([place])   Pressure injury of skin   SECONDARY DIAGNOSIS:   Past Medical History:  Diagnosis Date  . Anxiety   . Back problem 1960  . BPH (benign prostatic hypertrophy)   . Cervical radiculopathy    with neuropathy  . Dyspnea   . GERD (gastroesophageal reflux disease)   . HOH (hard of hearing)   . Hypertension   . Hypokalemia   . Melanoma in situ ([place]) 12/2016   neck  . Pancreatic adenocarcinoma [facility])    per patient D-I-L [name] , dx after last ercp, first oncology appt on 08-26-2018 at [facility]:  1. High fever and  sepsis due to bacteremia. The patient has a previous history of ESBL bacteremia.  -We will continue antibiotic therapy with IVmeropenembased on his previous culture in October 2019.  Vancomycin stopped as initial identification from culture showing Enterobacter and Klebsiella. -No acute findings or ductal dilation in the right upper quadrant sonogram which was done due to slightlyelevated LFTs.  Will follow serial LFTs daily.  Liver enzymes are coming down but bilirubin was still rising so called GI consult. Patient did not had any pain in his bilirubin level came down so GI suggested no further work-up at this time but advised to follow-up with his primary GI in [place] to consider need for ERCP. -Given his systolic murmur have obtain a 2D transthoracic echo.  -ID consult  done, suggested to give oral Cipro and Flagyl for 10 days.  2. Hypoxia with only bibasilar atelectasis on his chest x-ray. No pulmonary embolism or other abnormalities identified on CT chest angiogram. Patient improved and stable.  Able to walk without any difficulties around nursing station.  3. Pancreatic adenocarcinoma status post ERCP and metal stent as well as prior sphincterotomy. Pain management will be provided. Right upper quadrant ultrasound as above. By oncologist, patient was advised to have no further interventions or chemotherapy but to consider palliative care.  4. Hypokalemia.Potassium replaced and magnesium level is low so giving IV.  5. Hypertension.Toprol-XL will be held due to sepsis.  6. GERD.PPI therapy resumed.  7. BPH. Flomax resumed.  8. Cervical radiculopathy with neuropathy. Neurontin resumed.  9. DVT prophylaxis.  Stop Lovenox initially due to drop in hemoglobin but is guaiac is negative and hemoglobin came up will resume Lovenox.  10.  Chronic anemia-acute drop due to possible blood loss    Baseline hemoglobin around 10 sudden drop up to 7.  May be dilutional Hold anticoagulants, transfuse, check stool for guaiac- negative. Will resume Lovenox now.  76.  Jaundice-bilirubin is rising while other liver enzymes are gradually coming down. GI consult to discuss options and prognosis with family. As bilirubin improved, GI suggested no further work-up.  Suggested to involve palliative care in after discharge.   DISCHARGE CONDITIONS:   Stable.  CONSULTS OBTAINED:  Treatment Team:  Efrain Sella, MD Leonel Ramsay, MD  DRUG ALLERGIES:  No  Known Allergies  DISCHARGE MEDICATIONS:   Allergies as of 09/14/2018   No Known Allergies     Medication List    TAKE these medications   acetaminophen 325 MG tablet Commonly known as:  TYLENOL Take 650 mg by mouth every 6 (six) hours as needed.   Align 4 MG  Caps Take 4 mg by mouth daily.   ciprofloxacin 500 MG tablet Commonly known as:  Cipro Take 1 tablet (500 mg total) by mouth 2 (two) times daily for 10 days.   feeding supplement (ENSURE ENLIVE) Liqd Take 6 mLs by mouth 2 (two) times daily between meals. What changed:    how much to take  when to take this   ferrous sulfate 325 (65 FE) MG EC tablet Take 325 mg by mouth daily with breakfast.   gabapentin 600 MG tablet Commonly known as:  NEURONTIN Take 1 tablet (600 mg total) by mouth every morning. And 2 at bedtime What changed:    when to take this  additional instructions   metoprolol succinate 25 MG 24 hr tablet Commonly known as:  TOPROL-XL TAKE ONE TABLET BY MOUTH DAILY   metroNIDAZOLE 500 MG tablet Commonly known as:  Flagyl Take 1 tablet (500 mg total) by mouth 3 (three) times daily for 10 days.   multivitamin with minerals Tabs tablet Take 1 tablet by mouth daily.   omeprazole 20 MG capsule Commonly known as:  PRILOSEC Take 20 mg by mouth every morning.   pramipexole 0.125 MG tablet Commonly known as:  MIRAPEX Take 1 tablet (0.125 mg total) by mouth at bedtime as needed. What changed:  reasons to take this   tamsulosin 0.4 MG Caps capsule Commonly known as:  FLOMAX Take 1 capsule (0.4 mg total) by mouth daily.   traMADol 50 MG tablet Commonly known as:  ULTRAM Take 1 tablet (50 mg total) by mouth every 12 (twelve) hours as needed for moderate pain.   vitamin B-12 500 MCG tablet Commonly known as:  CYANOCOBALAMIN Take 500 mcg by mouth daily.        DISCHARGE INSTRUCTIONS:    Follow with primary GI and palliative care to follow as outpatient.  If you experience worsening of your admission symptoms, develop shortness of breath, life threatening emergency, suicidal or homicidal thoughts you must seek medical attention immediately by calling 911 or calling your MD immediately  if symptoms less severe.  You Must read complete  instructions/literature along with all the possible adverse reactions/side effects for all the Medicines you take and that have been prescribed to you. Take any new Medicines after you have completely understood and accept all the possible adverse reactions/side effects.   Please note  You were cared for by a hospitalist during your hospital stay. If you have any questions about your discharge medications or the care you received while you were in the hospital after you are discharged, you can call the unit and asked to speak with the hospitalist on call if the hospitalist that took care of you is not available. Once you are discharged, your primary care physician will handle any further medical issues. Please note that NO REFILLS for any discharge medications will be authorized once you are discharged, as it is imperative that you return to your primary care physician (or establish a relationship with a primary care physician if you do not have one) for your aftercare needs so that they can reassess your need for medications and monitor your lab values.  Today   CHIEF COMPLAINT:   Chief Complaint  Patient presents with  . Code Sepsis    HISTORY OF PRESENT ILLNESS:  Jose [name]  is a 85 y.o. male with a known history of multiple medical problems that will be mentioned below including pancreatic adenocarcinoma status post recent ERCP on 08/27/2018 and metal stent placement to the distal common bile duct for stricture with open prior sphincterotomy, who presented to the emergency room with acute onset of fever and chills.  Patient has not been feeling well today and within an hour of such complaint he was found on his front porch with Reiger's and chills.  He was noted to be febrile by EMS and his pulse oximetry was 92% on room air.  He was slightly confused and tachypneic.  He was initially thought to be highly suspicious for COVID-19.  He denied any nausea or vomiting or abdominal pain.  No  dysuria, oliguria or hematuria or flank pain.  No cough or wheezing or dyspnea.  No recent travels or sick exposure.    Upon arrival to the emergency room, his blood pressure was 142/66, pulse 144 and respiratory to 24 and a temperature of 105.8 rectally with a oximetry of 88% on room air and 99% on 4 L of O2 by nasal cannula.  His pulse x-ray showed bibasilar atelectasis with no acute abnormalities otherwise.  Urinalysis was unremarkable and his CBC showed mild leukopenia of 3.9 with hemoglobin of 10.5 hematocrit of 31.3 and platelets 143, therefore consistent with mild pancytopenia.  His INR is 1.3 and PT 16.  His lactic acid came back elevated at 3.1.  Venous blood gas showed a pH 7.34, PCO2 46, PO2 of 60 O2 sat of 88.9% initially on room air.  EKG showed sinus tachycardia with rate 147 with Q waves inferiorly.  His rapid COVID-19 test came back negative and his influenza a and B antigens came back negative as well.  The patient was given 1/2 L bolus of IV normal saline as well as IV meropenem and vancomycin.  He will be admitted to a medical monitored bed for further evaluation and management.   VITAL SIGNS:  Blood pressure 137/68, pulse 80, temperature 98.2 F (36.8 C), temperature source Oral, resp. rate 19, height 5\' 10"  (1.778 m), weight 56 kg, SpO2 98 %.  I/O:    Intake/Output Summary (Last 24 hours) at 09/14/2018 1430 Last data filed at 09/14/2018 1048 Gross per 24 hour  Intake 700 ml  Output 2050 ml  Net -1350 ml    PHYSICAL EXAMINATION:   GENERAL:  85 y.o.-year-old thin patient lying in the bed with no acute distress.  EYES: Pupils equal, round, reactive to light and accommodation.  Positive for scleral icterus. Extraocular muscles intact.  HEENT: Head atraumatic, normocephalic. Oropharynx and nasopharynx clear.  NECK:  Supple, no jugular venous distention. No thyroid enlargement, no tenderness.  LUNGS: Normal breath sounds bilaterally, no wheezing, rales,rhonchi or  crepitation. No use of accessory muscles of respiration.  CARDIOVASCULAR: S1, S2 normal. No murmurs, rubs, or gallops.  ABDOMEN: Soft, no tender, nondistended. Bowel sounds present. No organomegaly or mass.  EXTREMITIES: No pedal edema, cyanosis, or clubbing.  NEUROLOGIC: Cranial nerves II through XII are intact. Muscle strength 3-4/5 in all extremities. Sensation intact. Gait not checked.  PSYCHIATRIC: The patient is alert and oriented x 3.  SKIN: No obvious rash, lesion, or ulcer.   DATA REVIEW:   CBC Recent Labs  Lab 09/14/18 0446  WBC 4.3  HGB 10.2*  HCT 29.6*  PLT 90*    Chemistries  Recent Labs  Lab 09/11/18 2304  09/14/18 0446  NA 137   < > 135  K 3.2*   < > 4.3  CL 111   < > 109  CO2 19*   < > 22  GLUCOSE 126*   < > 118*  BUN 9   < > 12  CREATININE 0.78   < > 0.66  CALCIUM 6.6*   < > 7.5*  MG 1.3*  --   --   AST 228*   < > 33  ALT 91*   < > 45*  ALKPHOS 401*   < > 355*  BILITOT 2.2*   < > 2.5*   < > = values in this interval not displayed.    Cardiac Enzymes Recent Labs  Lab 09/11/18 2304  TROPONINI <0.03    Microbiology Results  Results for orders placed or performed during the hospital encounter of 09/11/18  Blood Culture (routine x 2)     Status: None (Preliminary result)   Collection Time: 09/11/18 10:10 PM  Result Value Ref Range Status   Specimen Description BLOOD LEFT ANTECUBITAL  Final   Special Requests   Final    BOTTLES DRAWN AEROBIC AND ANAEROBIC Blood Culture results may not be optimal due to an inadequate volume of blood received in culture bottles   Culture   Final    NO GROWTH 3 DAYS Performed at [facility], [address], [place], [place] [address]    Report Status PENDING  Incomplete  Blood Culture (routine x 2)     Status: Abnormal   Collection Time: 09/11/18 10:10 PM  Result Value Ref Range Status   Specimen Description   Final    BLOOD RIGHT WRIST Performed at [facility], [address]  [place], [place] [address]    Special Requests   Final    BOTTLES DRAWN AEROBIC AND ANAEROBIC Blood Culture results may not be optimal due to an inadequate volume of blood received in culture bottles Performed at [facility], [address] [place], [place] [address]    Culture  Setup Time   Final    Organism ID to follow [place] TO, READ BACK BY AND VERIFIED WITH: [facility] MARTIN AT 1236 09/12/2018.PMF GRAM STAIN REVIEWED-AGREE WITH RESULT T. TYSOR Performed at [facility], [place] [address] [place], [place] [address]    Culture KLEBSIELLA PNEUMONIAE (A)  Final   Report Status 09/14/2018 FINAL  Final   Organism ID, Bacteria KLEBSIELLA PNEUMONIAE  Final      Susceptibility   Klebsiella pneumoniae - MIC*    AMPICILLIN >=32 RESISTANT Resistant     CEFAZOLIN <=4 SENSITIVE Sensitive     CEFEPIME <=1 SENSITIVE Sensitive     CEFTAZIDIME <=1 SENSITIVE Sensitive     CEFTRIAXONE <=1 SENSITIVE Sensitive     CIPROFLOXACIN <=0.25 SENSITIVE Sensitive     GENTAMICIN <=1 SENSITIVE Sensitive     IMIPENEM <=0.25 SENSITIVE Sensitive     TRIMETH/SULFA <=20 SENSITIVE Sensitive     AMPICILLIN/SULBACTAM 16 INTERMEDIATE Intermediate     PIP/TAZO 16 SENSITIVE Sensitive     Extended ESBL NEGATIVE Sensitive     * KLEBSIELLA PNEUMONIAE  Urine culture     Status: None   Collection Time: 09/11/18 10:10 PM  Result Value Ref Range Status   Specimen Description   Final    URINE,  RANDOM Performed at [facility], [address], [place], [place] [address]    Special Requests   Final    Normal Performed at [facility], [address] [place], [place] [address]    Culture   Final    NO GROWTH Performed at [facility], [place] [address], [place], [place] [address]    Report Status 09/13/2018 FINAL  Final  SARS Coronavirus 2 [facility] order, Performed in [place] hospital lab)     Status: None   Collection Time:  09/11/18 10:10 PM  Result Value Ref Range Status   SARS Coronavirus 2 NEGATIVE NEGATIVE Final    Comment: (NOTE) If result is NEGATIVE SARS-CoV-2 target nucleic acids are NOT DETECTED. The SARS-CoV-2 RNA is generally detectable in upper and lower  respiratory specimens during the acute phase of infection. The lowest  concentration of SARS-CoV-2 viral copies this assay can detect is 250  copies / mL. A negative result does not preclude SARS-CoV-2 infection  and should not be used as the sole basis for treatment or other  patient management decisions.  A negative result may occur with  improper specimen collection / handling, submission of specimen other  than nasopharyngeal swab, presence of viral mutation(s) within the  areas targeted by this assay, and inadequate number of viral copies  (<250 copies / mL). A negative result must be combined with clinical  observations, patient history, and epidemiological information. If result is POSITIVE SARS-CoV-2 target nucleic acids are DETECTED. The SARS-CoV-2 RNA is generally detectable in upper and lower  respiratory specimens dur ing the acute phase of infection.  Positive  results are indicative of active infection with SARS-CoV-2.  Clinical  correlation with patient history and other diagnostic information is  necessary to determine patient infection status.  Positive results do  not rule out bacterial infection or co-infection with other viruses. If result is PRESUMPTIVE POSTIVE SARS-CoV-2 nucleic acids MAY BE PRESENT.   A presumptive positive result was obtained on the submitted specimen  and confirmed on repeat testing.  While 2019 novel coronavirus  (SARS-CoV-2) nucleic acids may be present in the submitted sample  additional confirmatory testing may be necessary for epidemiological  and / or clinical management purposes  to differentiate between  SARS-CoV-2 and other Sarbecovirus currently known to infect humans.  If clinically  indicated additional testing with an alternate test  methodology [id number]) is advised. The SARS-CoV-2 RNA is generally  detectable in upper and lower respiratory sp ecimens during the acute  phase of infection. The expected result is Negative. Fact Sheet for Patients:  StrictlyIdeas.no Fact Sheet for Healthcare Providers: BankingDealers.co.za This test is not yet approved or cleared by the [place] FDA and has been authorized for detection and/or diagnosis of SARS-CoV-2 by FDA under an Emergency Use Authorization (EUA).  This EUA will remain in effect (meaning this test can be used) for the duration of the COVID-19 declaration under Section 564(b)(1) of the Act, 21 U.S.C. section 360bbb-3(b)(1), unless the authorization is terminated or revoked sooner. Performed at [facility], [address] [place], [place] [address]   Blood Culture ID Panel (Reflexed)     Status: Abnormal   Collection Time: 09/11/18 10:10 PM  Result Value Ref Range Status   Enterococcus species NOT DETECTED NOT DETECTED Final   Listeria monocytogenes NOT DETECTED NOT DETECTED Final   Staphylococcus species NOT DETECTED NOT DETECTED Final   Staphylococcus aureus (BCID) NOT DETECTED NOT DETECTED Final   Streptococcus  species NOT DETECTED NOT DETECTED Final   Streptococcus agalactiae NOT DETECTED NOT DETECTED Final   Streptococcus pneumoniae NOT DETECTED NOT DETECTED Final   Streptococcus pyogenes NOT DETECTED NOT DETECTED Final   Acinetobacter baumannii NOT DETECTED NOT DETECTED Final   Enterobacteriaceae species DETECTED (A) NOT DETECTED Final    Comment: Enterobacteriaceae represent a large family of gram-negative bacteria, not a single organism. CRITICAL RESULT CALLED TO, READ BACK BY AND VERIFIED WITH: [facility] MARTIN AT 1236 09/12/2018.PMF    Enterobacter cloacae complex NOT DETECTED NOT DETECTED Final   Escherichia coli NOT DETECTED NOT DETECTED  Final   Klebsiella oxytoca NOT DETECTED NOT DETECTED Final   Klebsiella pneumoniae DETECTED (A) NOT DETECTED Final    Comment: CRITICAL RESULT CALLED TO, READ BACK BY AND VERIFIED WITH: [facility] MARTIN AT 1236 09/12/2018.PMF    Proteus species NOT DETECTED NOT DETECTED Final   Serratia marcescens NOT DETECTED NOT DETECTED Final   Carbapenem resistance NOT DETECTED NOT DETECTED Final   Haemophilus influenzae NOT DETECTED NOT DETECTED Final   Neisseria meningitidis NOT DETECTED NOT DETECTED Final   Pseudomonas aeruginosa NOT DETECTED NOT DETECTED Final   Candida albicans NOT DETECTED NOT DETECTED Final   Candida glabrata NOT DETECTED NOT DETECTED Final   Candida krusei NOT DETECTED NOT DETECTED Final   Candida parapsilosis NOT DETECTED NOT DETECTED Final   Candida tropicalis NOT DETECTED NOT DETECTED Final    Comment: Performed at [facility], [address] [place], [place] [address]  Culture, blood (single) w Reflex to ID Panel     Status: None (Preliminary result)   Collection Time: 09/14/18  4:46 AM  Result Value Ref Range Status   Specimen Description BLOOD LEFT ARM  Final   Special Requests   Final    BOTTLES DRAWN AEROBIC AND ANAEROBIC Blood Culture adequate volume   Culture   Final    NO GROWTH < 12 HOURS Performed at [facility], [address] [place], [place] [address]    Report Status PENDING  Incomplete    RADIOLOGY:  No results found.  EKG:   Orders placed or performed during the hospital encounter of 09/11/18  . EKG 12-Lead  . EKG 12-Lead  . ED EKG 12-Lead  . ED EKG 12-Lead  . EKG 12-Lead  . EKG 12-Lead   Management plans discussed with the patient, family and they are in agreement.  CODE STATUS:     Code Status Orders  (From admission, onward)         Start     Ordered   09/12/18 0038  Do not attempt resuscitation (DNR)  Continuous    Question Answer Comment  In the event of cardiac or respiratory ARREST Do not call a "code  blue"   In the event of cardiac or respiratory ARREST Do not perform Intubation, CPR, defibrillation or ACLS   In the event of cardiac or respiratory ARREST Use medication by any route, position, wound care, and other measures to relive pain and suffering. May use oxygen, suction and manual treatment of airway obstruction as needed for comfort.   Comments DNR/DNI      09/12/18 0044        Code Status History    Date Active Date Inactive Code Status Order ID Comments User Context   04/23/2018 1423 04/26/2018 1726 DNR [id number]  Louellen Molder, MD ED   04/23/2018 0739 04/23/2018 1423 DNR [id number]  Louellen Molder, MD ED   04/20/2018 0124 04/22/2018 1614 DNR [id number]  Rise Patience, MD ED   04/20/2018 0022 04/20/2018 0124 DNR [id number]  Roxanne Mins, MD ED   03/28/2018 0046 04/03/2018 1557 Full Code [id number]  [name], MD ED   03/12/2018 0244 03/18/2018 1929 Full Code [id number]  Arta Silence, MD ED   01/26/2018 1208 01/27/2018 1759 Full Code [id number]  Coralie Keens, MD Inpatient   01/04/2018 0821 01/05/2018 1940 Full Code [id number]  Herbert Pun, MD ED   12/27/2017 1338 12/30/2017 1723 Full Code [id number]  Benjamine Sprague, DO Inpatient      TOTAL TIME TAKING CARE OF THIS PATIENT: 35 minutes.    Vaughan Basta M.D on 09/14/2018 at 2:30 PM  Between 7am to 6pm - Pager - [phone]  After 6pm go to [email or web address] - password [email or web address] [place] [facility]  Office  [phone]  CC: Primary care physician; [name], MD   Note: This dictation was prepared with Dragon dictation along with smaller phrase technology. Any transcriptional errors that result from this process are unintentional.

## 2018-09-14 NOTE — Progress Notes (Signed)
*  PRELIMINARY RESULTS* Echocardiogram 2D Echocardiogram has been performed.  Sherrie Sport 09/14/2018, 6:50 AM

## 2018-09-14 NOTE — Progress Notes (Signed)
Discharge instructions given to pt and went over with daughter in law [name]) on the phone. IVs removed. Pt dressed and will discharge home with son and daughter in law.

## 2018-09-14 NOTE — Progress Notes (Signed)
Initial Nutrition Assessment  DOCUMENTATION CODES:   Underweight  INTERVENTION:   Ensure Enlive po BID, each supplement provides 350 kcal and 20 grams of protein  Liberalize diet   MVI daily   NUTRITION DIAGNOSIS:   Increased nutrient needs related to cancer and cancer related treatments as evidenced by increased estimated needs  GOAL:   Patient will meet greater than or equal to 90% of their needs  MONITOR:   PO intake, Supplement acceptance, Labs, Skin, I & O's, Weight trends  REASON FOR ASSESSMENT:   Other (Comment)(low BMI)    ASSESSMENT:   85 y.o. Caucasian male with a known history of multiple medical problems that will be mentioned below including pancreatic adenocarcinoma status post recent ERCP on 08/27/2018 and metal stent placement to the distal common bile duct for stricture with open prior sphincterotomy, who presented to the emergency room with acute onset of fever and chills.   RD working remotely.  RD familiar with this patient from previous admit in October 2019. Pt is HOH but family reported at that time that patient has poor appetite and oral intake at baseline. Pt does enjoy vanilla Ensure and previously reported drinking 1 per day. Per chart, pt is weight stable since his last admit. Pt documented to be eating 50% of meals in hospital. Pt already ordered for Ensure and MVI. RD will liberalize patient's diet.  Pt previously diagnosed with moderate chronic malnutrition; unable to diagnose at this time as exam can not be performed today.    Medications reviewed and include: risaquad, lovenox, ferrous sulfate, MVI, protonix, KCl, B12, NaCl w/ KCl '@100ml'$ /hr, meropenem  Labs reviewed: alk phos 355(H), ALT 45(H), tbili 45(H) Hgb 10.2(L), Hct 29.6(L), MCH 34.1(H)  Unable to complete Nutrition-Focused physical exam at this time.   Diet Order:   Diet Order            Diet regular Room service appropriate? Yes; Fluid consistency: Thin  Diet effective now         Diet - low sodium heart healthy        Diet - low sodium heart healthy             EDUCATION NEEDS:   No education needs have been identified at this time  Skin:  Skin Assessment: Reviewed RN Assessment(Stage I sacrum )  Last BM:  4/26  Height:   Ht Readings from Last 1 Encounters:  09/11/18 '5\' 10"'$  (1.778 m)    Weight:   Wt Readings from Last 1 Encounters:  09/11/18 56 kg    Ideal Body Weight:  75.4 kg  BMI:  Body mass index is 17.71 kg/m.  Estimated Nutritional Needs:   Kcal:  1600-1800kcal/day  Protein:  80-90g/day   Fluid:  >1.4L/day   Koleen Distance MS, RD, LDN Pager #- [phone] Office#- [phone] After Hours Pager: [phone]

## 2018-09-14 NOTE — TOC Transition Note (Signed)
Transition of Care [facility]) - CM/SW Discharge Note   Patient Details  Name: Jose [name] MRN: [id number] Date of Birth: 1923/12/19  Transition of Care [facility]) CM/SW Contact:  Su Hilt, RN Phone Number: 09/14/2018, 3:00 PM   Clinical Narrative:    Patient to dc today with PT thru [place] with [facility] stated that it should not be a problem getting Drema Halon PT for this patient as he has had him before The patient has no DME needs  Will be followed by Outpatient Palliative Nurse to call family for Transport home  Patient is current with PCP Dr. Silvio Pate [facility] Can afford medication   Final next level of care: [place] Barriers to Discharge: Barriers Resolved   Patient Goals and CMS Choice Patient states their goals for this hospitalization and ongoing recovery are:: go home CMS Medicare.gov Compare Post Acute Care list provided to:: Patient Represenative (must comment)(Kathy Daughter in law)    Discharge Placement                       Discharge Plan and Services   Discharge Planning Services: CM Consult                      HH Arranged: PT [facility] Agency: [place] ([place]) Date Nettie: 09/14/18 Time [place]: [place] Representative spoke with at [place]: [place] ([place]) Interventions     Readmission Risk Interventions No flowsheet data found.

## 2018-09-14 NOTE — Progress Notes (Addendum)
GI Inpatient Follow-up Note  Subjective:  Patient seen in f/u for elevated liver enzymes and pancreatic adenocarcinoma. He reports no acute events overnight. He is tolerating a regular diet without any difficulties. He denies any abdominal pain, nausea, vomiting. Family meeting with hospice is scheduled for later this afternoon. Pt is adamant he is ready to go home.   Scheduled Inpatient Medications:  . acidophilus  1 capsule Oral Daily  . enoxaparin (LOVENOX) injection  40 mg Subcutaneous Q24H  . feeding supplement (ENSURE ENLIVE)  6 mL Oral BID BM  . ferrous sulfate  325 mg Oral Q breakfast  . gabapentin  600 mg Oral BID  . multivitamin with minerals  1 tablet Oral Daily  . pantoprazole  40 mg Oral Daily  . potassium chloride  40 mEq Oral Once  . tamsulosin  0.4 mg Oral Daily  . vitamin B-12  500 mcg Oral Daily    Continuous Inpatient Infusions:   . 0.9 % NaCl with KCl 20 mEq / L 100 mL/hr at 09/14/18 0017  . meropenem (MERREM) IV 1 g (09/14/18 1025)    PRN Inpatient Medications:  ketorolac, magnesium hydroxide, pramipexole, promethazine, traMADol, traZODone  Review of Systems: Constitutional: Weight is stable.  Eyes: No changes in vision. ENT: No oral lesions, sore throat.  GI: see HPI.  Heme/Lymph: No easy bruising.  CV: No chest pain.  GU: No hematuria.  Integumentary: No rashes.  Neuro: No headaches.  Psych: No depression/anxiety.  Endocrine: No heat/cold intolerance.  Allergic/Immunologic: No urticaria.  Resp: No cough, SOB.  Musculoskeletal: No joint swelling.    Physical Examination: BP 137/68 (BP Location: Right Arm)   Pulse 80   Temp 98.2 F (36.8 C) (Oral)   Resp 19   Ht _0  (1.778 m)   Wt 56 kg   SpO2 98%   BMI 17.71 kg/m  Gen: NAD, alert and oriented x 4 HEENT: PEERLA, EOMI, Neck: supple, no JVD or thyromegaly Chest: CTA bilaterally, no wheezes, crackles, or other adventitious sounds CV: RRR, no m/g/c/r Abd: soft, NT, ND, +BS in all four  quadrants; no HSM, guarding, ridigity, or rebound tenderness Ext: no edema, well perfused with 2+ pulses, Skin: no rash or lesions noted Lymph: no LAD  Data: Lab Results  Component Value Date   WBC 4.3 09/14/2018   HGB 10.2 (L) 09/14/2018   HCT 29.6 (L) 09/14/2018   MCV 99.0 09/14/2018   PLT 90 (L) 09/14/2018   Recent Labs  Lab 09/12/18 0614 09/13/18 0124 09/14/18 0446  HGB 7.0* 9.6* 10.2*   Lab Results  Component Value Date   NA 135 09/14/2018   K 4.3 09/14/2018   CL 109 09/14/2018   CO2 22 09/14/2018   BUN 12 09/14/2018   CREATININE 0.66 09/14/2018   Lab Results  Component Value Date   ALT 45 (H) 09/14/2018   AST 33 09/14/2018   ALKPHOS 355 (H) 09/14/2018   BILITOT 2.5 (H) 09/14/2018   Recent Labs  Lab 09/11/18 2210  09/13/18 0813  APTT 27  --   --   INR 1.3*   < > 1.4*   < > = values in this interval not displayed.   Assessment: 1. Pancreatic adenocarcinoma - Sees oncologist Dr. Benay Spice at [facility]. Due to advanced age, no chemotherapy is advised. S/p ERCP with uncovered biliary metal stent placement by Dr. Clarene Essex in [place] on 08/27/2018.   2. Elevated liver enzymes - Total bilirubin improved this morning to 2.5 (  8.9 yesterday), alk phos 355, RUQ [place] 04/25 showed no CBD dilatation to suggest biliary tract obstruction  3. Hypoxia, currently stable.  4. Advanced age   34. Malnutrition  6. Sepsis - possibly from cholestasis. Continue antibiotics per primary team   Recommendations:  1. Total bilirubin has greatly improved to 2.5, still elevated. RUQ US showed no CBD dilatation. I do not see the need for any urgent ERCP at this time. He is advised to follow up with Dr. Clarene Essex  2. Patient and family will need further discussion and guidance from his oncology and palliative care team about short-term goals and his overall poor prognosis  3. GI will sign off at this time  Please call with questions or  concerns.   Octavia Bruckner, PA-C [facility] [phone] (Cell)

## 2018-09-14 NOTE — TOC Progression Note (Signed)
Transition of Care [facility]) - Progression Note    Patient Details  Name: Jose [name] MRN: [id number] Date of Birth: [birth year]  Transition of Care [facility]) CM/SW [place], RN Phone Number: 09/14/2018, 2:22 PM  Clinical Narrative:    Hulen Skains and Spoke to [facility] Daughter-inlaw The patient has no equpment needs and will agree to [facility] and would prefer to have Drema Halon as a PT therapist I called Corene Cornea with [facility] to set up. Awaiting a call back from [facility]      Expected Discharge Plan: Home/Self Care Barriers to Discharge: Continued Medical Work up  Expected Discharge Plan and Services Expected Discharge Plan: Home/Self Care   Discharge Planning Services: CM Consult   Living arrangements for the past 2 months: Single Family Home                                       Social Determinants of Health (SDOH) Interventions    Readmission Risk Interventions No flowsheet data found.

## 2018-09-14 NOTE — Progress Notes (Signed)
Pt A&Ox4. Urinary catheter removed per MD Vachhani. Assisted pt to stand and void in urinal. Pt up in chair resting quietly in room. No other complaints at this time.

## 2018-09-14 NOTE — Consult Note (Signed)
Infectious Disease     Reason for Consult:  Gram negative bacteremia   Referring Physician: Anselm Jungling Date of Admission:  09/11/2018   Active Problems:   Sepsis ([place])   Pressure injury of skin   HPI: Jose [name] is a 85 y.o. male with prior ESBL bactereima, end stage pancreatic cancer leading to repeat biliary obstruction despite multiple ERCPs and stent placement.  Currently admitted with acute onset fevers and chills. On admit temp 106 and tachy to 144.  LFTs elevated and imaging with USS showed no evidence of obstruction. Has been seen by GI and palliative care.  GI suggests with improving LFTs and no pain that there is no need for urgent ERCP. Patient wishes to be dced hom.   Past Medical History:  Diagnosis Date  . Anxiety   . Back problem 1960  . BPH (benign prostatic hypertrophy)   . Cervical radiculopathy    with neuropathy  . Dyspnea   . GERD (gastroesophageal reflux disease)   . HOH (hard of hearing)   . Hypertension   . Hypokalemia   . Melanoma in situ ([place]) 12/2016   neck  . Pancreatic adenocarcinoma [facility])    per patient D-I-L [name] , dx after last ercp, first oncology appt on 08-26-2018 at [facility] cancer center   Past Surgical History:  Procedure Laterality Date  . BILIARY STENT PLACEMENT  03/30/2018   Procedure: BILIARY STENT PLACEMENT;  Surgeon: Clarene Essex, MD;  Location: [facility] ENDOSCOPY;  Service: Endoscopy;;  . BILIARY STENT PLACEMENT N/A 08/27/2018   Procedure: BILIARY STENT PLACEMENT;  Surgeon: Clarene Essex, MD;  Location: [facility] ENDOSCOPY;  Service: Endoscopy;  Laterality: N/A;  . CATARACT EXTRACTION     OD  . CHOLECYSTECTOMY N/A 01/26/2018   Procedure: LAPAROSCOPIC CHOLECYSTECTOMY;  Surgeon: Coralie Keens, MD;  Location: [place];  Service: General;  Laterality: N/A;  . ENDOSCOPIC RETROGRADE CHOLANGIOPANCREATOGRAPHY (ERCP) WITH PROPOFOL N/A 03/13/2018   Procedure: ENDOSCOPIC RETROGRADE CHOLANGIOPANCREATOGRAPHY (ERCP) WITH PROPOFOL;  Surgeon: Lucilla Lame, MD;   Location: [facility] ENDOSCOPY;  Service: Endoscopy;  Laterality: N/A;  . ERCP N/A 03/29/2018   Procedure: ENDOSCOPIC RETROGRADE CHOLANGIOPANCREATOGRAPHY (ERCP);  Surgeon: Ronnette Juniper, MD;  Location: [place];  Service: Gastroenterology;  Laterality: N/A;  . ERCP N/A 03/30/2018   Procedure: ENDOSCOPIC RETROGRADE CHOLANGIOPANCREATOGRAPHY (ERCP);  Surgeon: Clarene Essex, MD;  Location: [place];  Service: Endoscopy;  Laterality: N/A;  . ERCP N/A 08/10/2018   Procedure: ENDOSCOPIC RETROGRADE CHOLANGIOPANCREATOGRAPHY (ERCP);  Surgeon: Clarene Essex, MD;  Location: [facility];  Service: Endoscopy;  Laterality: N/A;  Balloon sweep for stones  . ERCP N/A 08/27/2018   Procedure: ENDOSCOPIC RETROGRADE CHOLANGIOPANCREATOGRAPHY (ERCP) with STENT PLACEMENT;  Surgeon: Clarene Essex, MD;  Location: [facility] ENDOSCOPY;  Service: Endoscopy;  Laterality: N/A;  . IR PERC CHOLECYSTOSTOMY  12/28/2017  . LAPAROSCOPIC CHOLECYSTECTOMY  01/26/2018  . MELANOMA EXCISION  12/2016   in situ---Dr Dasher  . SPHINCTEROTOMY  03/30/2018   Procedure: SPHINCTEROTOMY;  Surgeon: Clarene Essex, MD;  Location: [facility] ENDOSCOPY;  Service: Endoscopy;;  . STENT REMOVAL  03/29/2018   Procedure: STENT REMOVAL;  Surgeon: Ronnette Juniper, MD;  Location: [facility] END[facility]COPY;  Service: Gastroenterology;;  . Lavell Islam REMOVAL  08/10/2018   Procedure: STENT REMOVAL;  Surgeon: Clarene Essex, MD;  Location: [facility] ENDOSCOPY;  Service: Endoscopy;;  . TONSILLECTOMY     Social History   Tobacco Use  . Smoking status: Never Smoker  . Smokeless tobacco: Never Used  Substance Use Topics  . Alcohol use: No  . Drug use: No  Family History  Problem Relation Age of Onset  . Cancer Son   . Coronary artery disease Neg Hx   . Diabetes Neg Hx     Allergies: No Known Allergies  Current antibiotics: Antibiotics Given (last 72 hours)    Date/Time Action Medication Dose Rate   09/11/18 2227 New Bag/Given   meropenem (MERREM) 1 g in sodium chloride 0.9 % 100 mL IVPB 1 g 200 mL/hr    09/11/18 2306 New Bag/Given   vancomycin (VANCOCIN) 1,500 mg in sodium chloride 0.9 % 500 mL IVPB 1,500 mg 250 mL/hr   09/12/18 1016 New Bag/Given   meropenem (MERREM) 1 g in sodium chloride 0.9 % 100 mL IVPB 1 g 200 mL/hr   09/12/18 2044 New Bag/Given  [pts preference]   meropenem (MERREM) 1 g in sodium chloride 0.9 % 100 mL IVPB 1 g 200 mL/hr   09/13/18 0915 New Bag/Given   meropenem (MERREM) 1 g in sodium chloride 0.9 % 100 mL IVPB 1 g 200 mL/hr   09/13/18 2257 New Bag/Given   meropenem (MERREM) 1 g in sodium chloride 0.9 % 100 mL IVPB 1 g 200 mL/hr   09/14/18 1025 New Bag/Given   meropenem (MERREM) 1 g in sodium chloride 0.9 % 100 mL IVPB 1 g 200 mL/hr      MEDICATIONS: . acidophilus  1 capsule Oral Daily  . enoxaparin (LOVENOX) injection  40 mg Subcutaneous Q24H  . feeding supplement (ENSURE ENLIVE)  6 mL Oral BID BM  . ferrous sulfate  325 mg Oral Q breakfast  . gabapentin  600 mg Oral BID  . multivitamin with minerals  1 tablet Oral Daily  . pantoprazole  40 mg Oral Daily  . potassium chloride  40 mEq Oral Once  . tamsulosin  0.4 mg Oral Daily  . vitamin B-12  500 mcg Oral Daily    Review of Systems - 11 systems reviewed and negative per HPI   OBJECTIVE: Temp:  [97.4 F (36.3 C)-98.2 F (36.8 C)] 98.2 F (36.8 C) (04/27 0603) Pulse Rate:  [79-86] 80 (04/27 0603) Resp:  [17-19] 19 (04/27 0603) BP: (134-161)/(54-72) 137/68 (04/27 0603) SpO2:  [92 %-98 %] 98 % (04/27 0603) Physical Exam  Constitutional: He is very HOH-  oriented to person, place, and time. Chronically ill apperaing. HENT: anicteric Mouth/Throat: Oropharynx is clear and moist. No oropharyngeal exudate.  Cardiovascular: Normal rate, regular rhythm and normal heart sounds.Pulmonary/Chest: Effort normal and breath sounds normal. No respiratory distress. He has no wheezes.  Abdominal: Soft. Bowel sounds are normal. Mild distension. There is no tenderness.  Lymphadenopathy: He has no cervical  adenopathy.  Neurological: He is alert and oriented to person, place, and time.  Skin: Skin is warm and dry. No rash noted. No erythema.  Psychiatric: He has a normal mood and affect. His behavior is normal.    LABS: Results for orders placed or performed during the hospital encounter of 09/11/18 (from the past 48 hour(s))  Occult blood card to lab, stool     Status: None   Collection Time: 09/12/18 10:35 PM  Result Value Ref Range   Fecal Occult Bld NEGATIVE NEGATIVE    Comment: Performed at [facility], [address] [place], [place] [address]  Comprehensive metabolic panel     Status: Abnormal   Collection Time: 09/13/18  1:24 AM  Result Value Ref Range   Sodium 135 135 - 145 mmol/L   Potassium 4.2 3.5 - 5.1 mmol/L   Chloride 110  98 - 111 mmol/L   CO2 22 22 - 32 mmol/L   Glucose, Bld 117 (H) 70 - 99 mg/dL   BUN 14 8 - 23 mg/dL   Creatinine, Ser 0.70 0.61 - 1.24 mg/dL   Calcium 7.1 (L) 8.9 - 10.3 mg/dL   Total Protein 4.2 (L) 6.5 - 8.1 g/dL   Albumin 2.0 (L) 3.5 - 5.0 g/dL   AST 65 (H) 15 - 41 U/L   ALT 65 (H) 0 - 44 U/L   Alkaline Phosphatase 358 (H) 38 - 126 U/L   Total Bilirubin 8.9 (H) 0.3 - 1.2 mg/dL   GFR calc non Af Amer >60 >60 mL/min   GFR calc Af Amer >60 >60 mL/min   Anion gap 3 (L) 5 - 15    Comment: Performed at [facility], [address] [place], [place] [address]  CBC     Status: Abnormal   Collection Time: 09/13/18  1:24 AM  Result Value Ref Range   WBC 6.0 4.0 - 10.5 K/uL   RBC 2.86 (L) 4.22 - 5.81 MIL/uL   Hemoglobin 9.6 (L) 13.0 - 17.0 g/dL   HCT 28.1 (L) 39.0 - 52.0 %   MCV 98.3 80.0 - 100.0 fL   MCH 33.6 26.0 - 34.0 pg   MCHC 34.2 30.0 - 36.0 g/dL   RDW 23.6 (H) 11.5 - 15.5 %   Platelets 95 (L) 150 - 400 K/uL    Comment: Immature Platelet Fraction may be clinically indicated, consider ordering this additional test [id number]    nRBC 0.0 0.0 - 0.2 %    Comment: Performed at [facility], [address]  [place], [place] [address]  Protime-INR     Status: Abnormal   Collection Time: 09/13/18  8:13 AM  Result Value Ref Range   Prothrombin Time 17.3 (H) 11.4 - 15.2 seconds   INR 1.4 (H) 0.8 - 1.2    Comment: (NOTE) INR goal varies based on device and disease states. Performed at [facility], [address] [place], [place] [address]   Culture, blood (single) w Reflex to ID Panel     Status: None (Preliminary result)   Collection Time: 09/14/18  4:46 AM  Result Value Ref Range   Specimen Description BLOOD LEFT ARM    Special Requests      BOTTLES DRAWN AEROBIC AND ANAEROBIC Blood Culture adequate volume   Culture      NO GROWTH < 12 HOURS Performed at [facility], [address] [place], [place] [address]    Report Status PENDING   Comprehensive metabolic panel     Status: Abnormal   Collection Time: 09/14/18  4:46 AM  Result Value Ref Range   Sodium 135 135 - 145 mmol/L   Potassium 4.3 3.5 - 5.1 mmol/L   Chloride 109 98 - 111 mmol/L   CO2 22 22 - 32 mmol/L   Glucose, Bld 118 (H) 70 - 99 mg/dL   BUN 12 8 - 23 mg/dL   Creatinine, Ser 0.66 0.61 - 1.24 mg/dL   Calcium 7.5 (L) 8.9 - 10.3 mg/dL   Total Protein 4.4 (L) 6.5 - 8.1 g/dL   Albumin 2.2 (L) 3.5 - 5.0 g/dL   AST 33 15 - 41 U/L   ALT 45 (H) 0 - 44 U/L   Alkaline Phosphatase 355 (H) 38 - 126 U/L   Total Bilirubin 2.5 (H) 0.3 - 1.2 mg/dL   GFR calc non Af Amer >60 >60  mL/min   GFR calc Af Amer >60 >60 mL/min   Anion gap 4 (L) 5 - 15    Comment: Performed at [facility], [address] [name], [place] [address]  CBC     Status: Abnormal   Collection Time: 09/14/18  4:46 AM  Result Value Ref Range   WBC 4.3 4.0 - 10.5 K/uL   RBC 2.99 (L) 4.22 - 5.81 MIL/uL   Hemoglobin 10.2 (L) 13.0 - 17.0 g/dL   HCT 29.6 (L) 39.0 - 52.0 %   MCV 99.0 80.0 - 100.0 fL   MCH 34.1 (H) 26.0 - 34.0 pg   MCHC 34.5 30.0 - 36.0 g/dL   RDW 23.7 (H) 11.5 - 15.5 %   Platelets 90 (L) 150 - 400 K/uL    Comment:  Immature Platelet Fraction may be clinically indicated, consider ordering this additional test [id number]    nRBC 0.0 0.0 - 0.2 %    Comment: Performed at [facility], [address] [place], [place] [address]   No components found for: ESR, C REACTIVE PROTEIN MICRO: Recent Results (from the past 720 hour(s))  Blood Culture (routine x 2)     Status: None (Preliminary result)   Collection Time: 09/11/18 10:10 PM  Result Value Ref Range Status   Specimen Description BLOOD LEFT ANTECUBITAL  Final   Special Requests   Final    BOTTLES DRAWN AEROBIC AND ANAEROBIC Blood Culture results may not be optimal due to an inadequate volume of blood received in culture bottles   Culture   Final    NO GROWTH 3 DAYS Performed at [facility], [address], [place], [place] [address]    Report Status PENDING  Incomplete  Blood Culture (routine x 2)     Status: Abnormal   Collection Time: 09/11/18 10:10 PM  Result Value Ref Range Status   Specimen Description   Final    BLOOD RIGHT WRIST Performed at [facility], [address] [place], [place] [address]    Special Requests   Final    BOTTLES DRAWN AEROBIC AND ANAEROBIC Blood Culture results may not be optimal due to an inadequate volume of blood received in culture bottles Performed at [facility], [address] [place], [place] [address]    Culture  Setup Time   Final    Organism ID to follow [place] TO, READ BACK BY AND VERIFIED WITH: [facility] MARTIN AT 1236 09/12/2018.PMF GRAM STAIN REVIEWED-AGREE WITH RESULT T. TYSOR Performed at [facility], [place] [address], [place], [place] [address]    Culture KLEBSIELLA PNEUMONIAE (A)  Final   Report Status 09/14/2018 FINAL  Final   Organism ID, Bacteria KLEBSIELLA PNEUMONIAE  Final      Susceptibility   Klebsiella pneumoniae - MIC*    AMPICILLIN >=32 RESISTANT Resistant     CEFAZOLIN <=4  SENSITIVE Sensitive     CEFEPIME <=1 SENSITIVE Sensitive     CEFTAZIDIME <=1 SENSITIVE Sensitive     CEFTRIAXONE <=1 SENSITIVE Sensitive     CIPROFLOXACIN <=0.25 SENSITIVE Sensitive     GENTAMICIN <=1 SENSITIVE Sensitive     IMIPENEM <=0.25 SENSITIVE Sensitive     TRIMETH/SULFA <=20 SENSITIVE Sensitive     AMPICILLIN/SULBACTAM 16 INTERMEDIATE Intermediate     PIP/TAZO 16 SENSITIVE Sensitive     Extended ESBL NEGATIVE Sensitive     * KLEBSIELLA PNEUMONIAE  Urine culture     Status:  None   Collection Time: 09/11/18 10:10 PM  Result Value Ref Range Status   Specimen Description   Final    URINE, RANDOM Performed at [facility], [address] [place], [place] [address]    Special Requests   Final    Normal Performed at [facility], [address] [place], [place] [address]    Culture   Final    NO GROWTH Performed at [facility], [place] [address] [place], [place] [address]    Report Status 09/13/2018 FINAL  Final  SARS Coronavirus 2 [facility] order, Performed in [place] hospital lab)     Status: None   Collection Time: 09/11/18 10:10 PM  Result Value Ref Range Status   SARS Coronavirus 2 NEGATIVE NEGATIVE Final    Comment: (NOTE) If result is NEGATIVE SARS-CoV-2 target nucleic acids are NOT DETECTED. The SARS-CoV-2 RNA is generally detectable in upper and lower  respiratory specimens during the acute phase of infection. The lowest  concentration of SARS-CoV-2 viral copies this assay can detect is 250  copies / mL. A negative result does not preclude SARS-CoV-2 infection  and should not be used as the sole basis for treatment or other  patient management decisions.  A negative result may occur with  improper specimen collection / handling, submission of specimen other  than nasopharyngeal swab, presence of viral mutation(s) within the  areas targeted by this assay, and inadequate number of viral copies  (<250 copies / mL). A negative result  must be combined with clinical  observations, patient history, and epidemiological information. If result is POSITIVE SARS-CoV-2 target nucleic acids are DETECTED. The SARS-CoV-2 RNA is generally detectable in upper and lower  respiratory specimens dur ing the acute phase of infection.  Positive  results are indicative of active infection with SARS-CoV-2.  Clinical  correlation with patient history and other diagnostic information is  necessary to determine patient infection status.  Positive results do  not rule out bacterial infection or co-infection with other viruses. If result is PRESUMPTIVE POSTIVE SARS-CoV-2 nucleic acids MAY BE PRESENT.   A presumptive positive result was obtained on the submitted specimen  and confirmed on repeat testing.  While 2019 novel coronavirus  (SARS-CoV-2) nucleic acids may be present in the submitted sample  additional confirmatory testing may be necessary for epidemiological  and / or clinical management purposes  to differentiate between  SARS-CoV-2 and other Sarbecovirus currently known to infect humans.  If clinically indicated additional testing with an alternate test  methodology [phone]) is advised. The SARS-CoV-2 RNA is generally  detectable in upper and lower respiratory sp ecimens during the acute  phase of infection. The expected result is Negative. Fact Sheet for Patients:  StrictlyIdeas.no Fact Sheet for Healthcare Providers: BankingDealers.co.za This test is not yet approved or cleared by the [place] FDA and has been authorized for detection and/or diagnosis of SARS-CoV-2 by FDA under an Emergency Use Authorization (EUA).  This EUA will remain in effect (meaning this test can be used) for the duration of the COVID-19 declaration under Section 564(b)(1) of the Act, 21 U.S.C. section 360bbb-3(b)(1), unless the authorization is terminated or revoked sooner. Performed at [facility], [address] [place], [place] [address]   Blood Culture ID Panel (Reflexed)     Status: Abnormal   Collection Time: 09/11/18 10:10 PM  Result Value Ref Range Status   Enterococcus species NOT DETECTED NOT DETECTED Final   Listeria monocytogenes NOT DETECTED  NOT DETECTED Final   Staphylococcus species NOT DETECTED NOT DETECTED Final   Staphylococcus aureus (BCID) NOT DETECTED NOT DETECTED Final   Streptococcus species NOT DETECTED NOT DETECTED Final   Streptococcus agalactiae NOT DETECTED NOT DETECTED Final   Streptococcus pneumoniae NOT DETECTED NOT DETECTED Final   Streptococcus pyogenes NOT DETECTED NOT DETECTED Final   Acinetobacter baumannii NOT DETECTED NOT DETECTED Final   Enterobacteriaceae species DETECTED (A) NOT DETECTED Final    Comment: Enterobacteriaceae represent a large family of gram-negative bacteria, not a single organism. CRITICAL RESULT CALLED TO, READ BACK BY AND VERIFIED WITH: [facility] MARTIN AT 1236 09/12/2018.PMF    Enterobacter cloacae complex NOT DETECTED NOT DETECTED Final   Escherichia coli NOT DETECTED NOT DETECTED Final   Klebsiella oxytoca NOT DETECTED NOT DETECTED Final   Klebsiella pneumoniae DETECTED (A) NOT DETECTED Final    Comment: CRITICAL RESULT CALLED TO, READ BACK BY AND VERIFIED WITH: [facility] MARTIN AT 1236 09/12/2018.PMF    Proteus species NOT DETECTED NOT DETECTED Final   Serratia marcescens NOT DETECTED NOT DETECTED Final   Carbapenem resistance NOT DETECTED NOT DETECTED Final   Haemophilus influenzae NOT DETECTED NOT DETECTED Final   Neisseria meningitidis NOT DETECTED NOT DETECTED Final   Pseudomonas aeruginosa NOT DETECTED NOT DETECTED Final   Candida albicans NOT DETECTED NOT DETECTED Final   Candida glabrata NOT DETECTED NOT DETECTED Final   Candida krusei NOT DETECTED NOT DETECTED Final   Candida parapsilosis NOT DETECTED NOT DETECTED Final   Candida tropicalis NOT DETECTED NOT DETECTED Final    Comment:  Performed at [facility], [address] [place], [place] [address]  Culture, blood (single) w Reflex to ID Panel     Status: None (Preliminary result)   Collection Time: 09/14/18  4:46 AM  Result Value Ref Range Status   Specimen Description BLOOD LEFT ARM  Final   Special Requests   Final    BOTTLES DRAWN AEROBIC AND ANAEROBIC Blood Culture adequate volume   Culture   Final    NO GROWTH < 12 HOURS Performed at [facility], [place], [place] [address]    Report Status PENDING  Incomplete    IMAGING: Ct Angio Chest Pe W Or Wo Contrast  Result Date: 09/12/2018 CLINICAL DATA:  Fever with hypoxia. History of pancreatic adenocarcinoma. EXAM: CT ANGIOGRAPHY CHEST WITH CONTRAST TECHNIQUE: Multidetector CT imaging of the chest was performed using the standard protocol during bolus administration of intravenous contrast. Multiplanar CT image reconstructions and MIPs were obtained to evaluate the vascular anatomy. CONTRAST:  71m OMNIPAQUE IOHEXOL 350 MG/ML SOLN COMPARISON:  None. FINDINGS: Cardiovascular: --Pulmonary arteries: Contrast injection is sufficient to demonstrate satisfactory opacification of the pulmonary arteries to the segmental level, with attenuation of 400 HU at the main pulmonary artery. There is no pulmonary embolus. The main pulmonary artery is within normal limits for size. --Aorta: Satisfactory opacification of the thoracic aorta. No aortic dissection or other acute aortic syndrome. Conventional 3 vessel aortic branching pattern. The aortic course and caliber are normal. There is mild aortic atherosclerosis. --Heart: Normal size. No pericardial effusion. Mediastinum/Nodes: No mediastinal, hilar or axillary lymphadenopathy. The visualized thyroid and thoracic esophageal course are unremarkable. Lungs/Pleura: Small pleural effusions with atelectasis. There are multiple small pulmonary nodules, measuring up to 6 mm. Upper Abdomen: Contrast bolus  timing is not optimized for evaluation of the abdominal organs. There is pneumobilia with a metallic stents in the common bile duct. Limited visualization of known pancreatic adenocarcinoma. Musculoskeletal:  No chest wall abnormality. No acute or significant osseous findings. Review of the MIP images confirms the above findings. IMPRESSION: 1. No pulmonary embolus or acute aortic syndrome. 2.  Aortic atherosclerosis (ICD10-I70.0). 3. Pneumobilia with metallic stent in the common bile duct. Electronically Signed   By: Ulyses Jarred M.D.   On: 09/12/2018 03:45   Ct Abdomen Pelvis W Contrast  Result Date: 08/18/2018 CLINICAL DATA:  Pancreatic cancer follow up EXAM: CT ABDOMEN AND PELVIS WITH CONTRAST TECHNIQUE: Multidetector CT imaging of the abdomen and pelvis was performed using the standard protocol following bolus administration of intravenous contrast. CONTRAST:  124m ISOVUE-300 IOPAMIDOL (ISOVUE-300) INJECTION 61% COMPARISON:  04/19/2018 CT scan FINDINGS: Lower chest: Dependent ground-glass opacities in both lungs but with improvement of the prior basilar airspace opacities. Small bilateral pleural effusions. In the posterior basal segment left lower lobe there is a 1.1 by 0.9 cm smooth pulmonary nodule on image 25/5. This is not seen on the prior exam, although it could have been present but obscured by the surrounding airspace opacity. Potential small loculated component of the left basilar pleural effusion. Upper most images demonstrate a right infrahilar lymph node measuring 1.1 cm in short axis on image 1/2. Hepatobiliary: Pneumobilia. No compelling findings of a patent metastatic disease. The previous biliary stent is no longer present. Pancreas: An infiltrative mass in the head of the pancreas by my measurements measures 3.4 by 2.1 by 2.4 cm (volume = 9 cm^3) on image 32/2, and by my measurements was previously about 2.7 by 1.7 by 2.0 cm (volume = 4.8 cm^3). The mass has clearly enlarged and there is  rind of tumor extending along the posterior 180 degrees of the superior mesenteric artery as shown on images 32 through 33 of series 2. There is probably an intact fat plane between the mass and the superior mesenteric vein. Prominent dilatation of the dorsal pancreatic duct noted. The proper hepatic artery appears to arise in the standard fashion from the celiac trunk. Spleen: Unremarkable Adrenals/Urinary Tract: The adrenal glands appear normal. Urinary bladder unremarkable. Small left renal cysts are present. Stomach/Bowel: There are 2 small diverticula of the transverse duodenum. The pancreatic mass abuts a small portion of the transverse duodenum. Vascular/Lymphatic: Aortoiliac atherosclerotic vascular disease. As noted above, the pancreatic mass appears to abut the posterior 180 degrees of the superior mesenteric artery. No pathologic adenopathy is identified. Reproductive: Mild prostatomegaly. Other: Low-grade mesenteric and subcutaneous edema. Trace ascites in the right lower paracolic gutter. Musculoskeletal: Grade 1 degenerative retrolisthesis at L2-3. IMPRESSION: 1. The infiltrative mass in the pancreatic head has increased in volume by about 88% in the last 4 months. The mass abuts the posterior half of the superior mesenteric artery. Prominent dorsal pancreatic duct dilatation. 2. There is a newly visible 1.1 by 0.9 cm smooth pulmonary nodule in the right lower lobe. Although this may have been present previously but obscured by previous airspace opacity, I suspect that this is a new pulmonary nodule and quite possibly metastatic. Mild right infrahilar adenopathy. 3. No definite liver lesions are currently identified. The biliary stent is no longer present. Pneumobilia is noted without current significant biliary dilatation. 4. Other imaging findings of potential clinical significance: Improved bibasilar airspace opacities although dependent ground-glass opacities are still present. Small bilateral  pleural effusions potentially with some mild loculation on the left. Aortic Atherosclerosis (ICD10-I70.0). Mild prostatomegaly. Trace ascites with low-grade mesenteric and subcutaneous edema. Electronically Signed   By: WVan ClinesM.D.   On: 08/18/2018 15:12  Dg Chest Port 1 View  Result Date: 09/11/2018 CLINICAL DATA:  Shortness of breath EXAM: PORTABLE CHEST 1 VIEW COMPARISON:  04/26/2018 FINDINGS: Bibasilar atelectasis. No pleural effusion or pneumothorax. Calcific aortic atherosclerosis. No focal consolidation. IMPRESSION: Bibasilar atelectasis without acute airspace disease. Electronically Signed   By: Ulyses Jarred M.D.   On: 09/11/2018 22:50   Dg Ercp  Result Date: 08/27/2018 CLINICAL DATA:  Pancreatic mass and obstruction of the common bile duct. EXAM: ERCP TECHNIQUE: Multiple spot images obtained with the fluoroscopic device and submitted for interpretation post-procedure. COMPARISON:  CT of the abdomen on 08/18/2018 FINDINGS: Imaging obtained with a C-arm demonstrates cannulation of the common bile duct with contrast injection demonstrating high-grade stricture of the distal common bile duct. A metallic self expanding stent was placed across the level of biliary stricture with good positioning noted. 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 These images were submitted for radiologic interpretation only. Please see the procedural report for the amount of contrast and the fluoroscopy time utilized. Electronically Signed   By: Aletta Edouard M.D.   On: 08/27/2018 13:50   US Abdomen Limited Ruq  Result Date: 09/12/2018 CLINICAL DATA:  85 year old male with history of elevated liver function tests. Status post ERCP with placement of biliary stent on 08/27/2018. EXAM: ULTRASOUND ABDOMEN LIMITED RIGHT UPPER QUADRANT COMPARISON:  Abdominal ultrasound 03/12/2018. FINDINGS: Gallbladder: Status post  cholecystectomy. Common bile duct: Diameter: 8.2 mm Liver: Mild diffuse heterogeneous echotexture. No focal lesion identified. Within normal limits in parenchymal echogenicity. Portal vein is patent on color Doppler imaging with normal direction of blood flow towards the liver. IMPRESSION: 1. Mild diffuse heterogeneous echotexture throughout the hepatic parenchyma, nonspecific. 2. No common bile duct dilatation to suggest biliary tract obstruction. Electronically Signed   By: Vinnie Langton M.D.   On: 09/12/2018 10:18    Assessment:   Jose [name] is a 85 y.o. male with prior ESBL bactereima, end stage pancreatic cancer leading to repeat biliary obstruction despite multiple ERCPs and stent placement.  Currently admitted with acute onset fevers and chills. On admit temp 106 and tachy to 144.  LFTs elevated and imaging with USS showed no evidence of obstruction. Has been seen by GI and palliative care.  No need for urgent ERCP with decreasing LFTs.  Recommendations Change to oral cipro and flagyl for 14 days total  Should follow with GI as otpt to monitor LFTs and evaluate for need for repeat stent.   Thank you very much for allowing me to participate in the care of this patient. Please call with questions.   Cheral Marker. Ola Spurr, MD

## 2018-09-14 NOTE — Progress Notes (Signed)
New referral for outpatient Palliative to follow at home received following a Palliative Medicine consult. Patient information faxed to referral. Thank you. Flo Shanks BSN, RN, [place] [phone]

## 2018-09-14 NOTE — Progress Notes (Signed)
PT Cancellation Note  Patient Details Name: Jose [name] MRN: [id number] DOB: 06/08/1923   Cancelled Treatment:    Reason Eval/Treat Not Completed: Patient declined, no reason specified, Patient was going home and waiting for his son to get him.   [address], [place], [place] DPT 09/14/2018, 4:13 PM

## 2018-09-14 NOTE — Consult Note (Signed)
Consultation Note Date: 09/14/2018   Patient Name: Jose [name]  DOB: 1923/12/16  MRN: [id number]  Age / Sex: 85 y.o., male  PCP: [name], MD Referring Physician: Vaughan Basta, *  Reason for Consultation: Establishing goals of care  HPI/Patient Profile:  Jose [name]  is a 85 y.o. Caucasian male with a known history of multiple medical problems that will be mentioned below including pancreatic adenocarcinoma status post recent ERCP on 08/27/2018 and metal stent placement to the distal common bile duct for stricture with open prior sphincterotomy, who presented to the emergency room with acute onset of fever and chills.  Patient has not been feeling well today and within an hour of such complaint he was found on his front porch with Reiger's and chills.  Clinical Assessment and Goals of Care: Patient is sitting in bedside chair. He states he lives alone with his dog. He states he had 2 children, but the youngest died of cancer a year ago. He states he makes decisions for himself. He states he wants to go home as he is not having pain and is eating well. He has eaten over half of his lunch presently.  He states he uses hearing aids in both ears typically, but does not have them here. With increasing volume of voice steadly to speaking very loudly, he is still unable to hear me, even with being a few feet away. Will need hearing aids for a [place] conversation. If D/C today, recommend palliative to follow outpatient for [place] conversation.   Called daughter in law and son to bring hearing aids. They state he is being discharged today, which is now conformed with primary MD. They state they are planning to have someone come in and help around the clock when the time comes he needs it. They would like to be present for [place] conversation.   SUMMARY OF RECOMMENDATIONS   Recommend hearing aids for a [place]  conversation. Recommend palliative to follow at D/C ASAP for GOC as he is discharging today. He is hospice appropriate if his goals align.     Prognosis:   < 6 months without cancer tx.   Discharge Planning: Home with Palliative Services      Primary Diagnoses: Present on Admission: . Sepsis ([place])   I have reviewed the medical record, interviewed the patient and family, and examined the patient. The following aspects are pertinent.  Past Medical History:  Diagnosis Date  . Anxiety   . Back problem 1960  . BPH (benign prostatic hypertrophy)   . Cervical radiculopathy    with neuropathy  . Dyspnea   . GERD (gastroesophageal reflux disease)   . HOH (hard of hearing)   . Hypertension   . Hypokalemia   . Melanoma in situ ([place]) 12/2016   neck  . Pancreatic adenocarcinoma [facility] [place])    per patient D-I-L [name] , dx after last ercp, first oncology appt on 08-26-2018 at [facility]   Social History   Socioeconomic History  . Marital status: Widowed  Spouse name: Not on file  . Number of children: 2  . Years of education: Not on file  . Highest education level: Not on file  Occupational History  . Occupation: retiredOccupational psychologist [employer]    Employer: RETIRED  Social Needs  . Financial resource strain: Not on file  . Food insecurity:    Worry: Not on file    Inability: Not on file  . Transportation needs:    Medical: Not on file    Non-medical: Not on file  Tobacco Use  . Smoking status: Never Smoker  . Smokeless tobacco: Never Used  Substance and Sexual Activity  . Alcohol use: No  . Drug use: No  . Sexual activity: Not on file  Lifestyle  . Physical activity:    Days per week: Not on file    Minutes per session: Not on file  . Stress: Not on file  Relationships  . Social connections:    Talks on phone: Patient refused    Gets together: Patient refused    Attends religious service: Patient refused    Active member of club or organization: Patient refused     Attends meetings of clubs or organizations: Patient refused    Relationship status: Patient refused  Other Topics Concern  . Not on file  Social History Narrative   Has living will   Son Mikeal Hawthorne is health care POA.     Requests DNR--done 05/07/16   No feeding tube if cognitively unaware   Family History  Problem Relation Age of Onset  . Cancer Son   . Coronary artery disease Neg Hx   . Diabetes Neg Hx    Scheduled Meds: . acidophilus  1 capsule Oral Daily  . enoxaparin (LOVENOX) injection  40 mg Subcutaneous Q24H  . feeding supplement (ENSURE ENLIVE)  6 mL Oral BID BM  . ferrous sulfate  325 mg Oral Q breakfast  . gabapentin  600 mg Oral BID  . multivitamin with minerals  1 tablet Oral Daily  . pantoprazole  40 mg Oral Daily  . potassium chloride  40 mEq Oral Once  . tamsulosin  0.4 mg Oral Daily  . vitamin B-12  500 mcg Oral Daily   Continuous Infusions: . 0.9 % NaCl with KCl 20 mEq / L 100 mL/hr at 09/14/18 1323  . meropenem (MERREM) IV 1 g (09/14/18 1025)   PRN Meds:.ketorolac, magnesium hydroxide, pramipexole, promethazine, traMADol, traZODone Medications Prior to Admission:  Prior to Admission medications   Medication Sig Start Date End Date Taking? Authorizing Provider  acetaminophen (TYLENOL) 325 MG tablet Take 650 mg by mouth every 6 (six) hours as needed.   Yes [provider]  feeding supplement, ENSURE ENLIVE, (ENSURE ENLIVE) LIQD Take 6 mLs by mouth 2 (two) times daily between meals. Patient taking differently: Take 237 mLs by mouth daily.  03/17/18  Yes Wieting, Richard, MD  ferrous sulfate 325 (65 FE) MG EC tablet Take 325 mg by mouth daily with breakfast.   Yes [provider]  gabapentin (NEURONTIN) 600 MG tablet Take 1 tablet (600 mg total) by mouth every morning. And 2 at bedtime Patient taking differently: Take 600 mg by mouth 2 (two) times daily.  06/04/18  Yes [name], MD  metoprolol succinate (TOPROL-XL) 25 MG 24 hr tablet TAKE  ONE TABLET BY MOUTH DAILY 05/28/18  Yes [name], MD  Multiple Vitamin (MULTIVITAMIN WITH MINERALS) TABS tablet Take 1 tablet by mouth daily. 03/17/18  Yes Wieting, Richard,  MD  omeprazole (PRILOSEC) 20 MG capsule Take 20 mg by mouth every morning.   Yes [provider]  pramipexole (MIRAPEX) 0.125 MG tablet Take 1 tablet (0.125 mg total) by mouth at bedtime as needed. Patient taking differently: Take 0.125 mg by mouth at bedtime as needed (restless legs).  06/18/18  Yes [name], MD  Probiotic Product (ALIGN) 4 MG CAPS Take 4 mg by mouth daily.   Yes [provider]  tamsulosin (FLOMAX) 0.4 MG CAPS capsule Take 1 capsule (0.4 mg total) by mouth daily. 06/26/18  Yes [name], MD  traMADol (ULTRAM) 50 MG tablet Take 1 tablet (50 mg total) by mouth every 12 (twelve) hours as needed for moderate pain. 09/02/18  Yes Owens Shark, NP  vitamin B-12 (CYANOCOBALAMIN) 500 MCG tablet Take 500 mcg by mouth daily.   Yes [provider]   No Known Allergies Review of Systems  All other systems reviewed and are negative.   Physical Exam Pulmonary:     Effort: Pulmonary effort is normal.  Neurological:     Mental Status: He is alert.     Vital Signs: BP 137/68 (BP Location: Right Arm)   Pulse 80   Temp 98.2 F (36.8 C) (Oral)   Resp 19   Ht 5\' 10"  (1.778 m)   Wt 56 kg   SpO2 98%   BMI 17.71 kg/m  Pain Scale: 0-10   Pain Score: 0-No pain   SpO2: SpO2: 98 % O2 Device:SpO2: 98 % O2 Flow Rate: .O2 Flow Rate (L/min): 1 L/min  IO: Intake/output summary:   Intake/Output Summary (Last 24 hours) at 09/14/2018 1406 Last data filed at 09/14/2018 1048 Gross per 24 hour  Intake 700 ml  Output 2050 ml  Net -1350 ml    LBM: Last BM Date: 09/13/18 Baseline Weight: Weight: 56 kg Most recent weight: Weight: 56 kg     Palliative Assessment/Data:     Time In: 1:50 Time Out: 2:30 Time Total: 35 min Greater than 50%  of this time was spent  counseling and coordinating care related to the above assessment and plan.  Signed by: Asencion Gowda, NP   Please contact Palliative Medicine Team phone at [phone] for questions and concerns.  For individual provider: See Shea Evans

## 2018-09-14 NOTE — Discharge Instructions (Signed)
Follow with GI in [place] in 1-2 weeks. Palliative care to follow at home after discharge.

## 2018-09-14 NOTE — Progress Notes (Signed)
Pharmacy Antibiotic Note  Jose [name] is a 85 y.o. male admitted on 09/11/2018 with pneumonia.  Pharmacy has been consulted for vanc/meropenem dosing.  Plan: Meropenem 1g IV q12h per CrCl 26 - 50 ml/min. Will continue to monitor for s/sx infx and defervescence of infx and renal function.  Height: 5\' 10"  (177.8 cm) Weight: 123 lb 7.3 oz (56 kg) IBW/kg (Calculated) : 73  Temp (24hrs), Avg:97.9 F (36.6 C), Min:97.4 F (36.3 C), Max:98.2 F (36.8 C)  Recent Labs  Lab 09/11/18 2210 09/11/18 2303 09/11/18 2304 09/12/18 0102 09/12/18 0453 09/12/18 0808 09/13/18 0124 09/14/18 0446  WBC 3.9*  --   --   --  5.4  --  6.0 4.3  CREATININE  --   --  0.78  --  0.71  --  0.70 0.66  LATICACIDVEN  --  3.1*  --  3.2* 2.1* 1.9  --   --     Estimated Creatinine Clearance: 46.7 mL/min (by C-G formula based on SCr of 0.66 mg/dL).    No Known Allergies   Thank you for allowing pharmacy to be a part of this patient's care.   Rayna Sexton, PharmD, BCPS Clinical Pharmacist 09/14/2018 11:39 AM

## 2018-09-15 ENCOUNTER — Telehealth: Payer: Self-pay

## 2018-09-15 NOTE — Telephone Encounter (Signed)
Okay for palliative care consultation

## 2018-09-15 NOTE — Telephone Encounter (Signed)
Message left for [facility] with authorization

## 2018-09-15 NOTE — Telephone Encounter (Signed)
Denise with [facility] said pt was discharged from [facility] on 09/14/18 and [facility] recommended palliative care. Denise request verbal order for palliative care.Please advise.

## 2018-09-15 NOTE — Telephone Encounter (Signed)
SW patients DIL, [name].   Transition Care Management Follow-up Telephone Call  DATE OF ADMISSION:  09/11/2018     DATE OF DISCHARGE: 09/14/2018   How have you been since you were released from the hospital? "He's doing fine, still weak"   Do you understand why you were in the hospital? yes   Do you understand the discharge instructions? yes   Where were you discharged to? Home. Lives alone. Family close. PT to visit in home.    Items Reviewed:  Medications reviewed: yes  Allergies reviewed: yes  Dietary changes reviewed: yes  Referrals reviewed: yes   Functional Questionnaire:   Activities of Daily Living (ADLs):   He states they are independent in the following: ambulation, bathing and hygiene, feeding, continence, grooming, toileting and dressing States they require assistance with the following: None.    Any transportation issues/concerns?: no   Any patient concerns? no   Confirmed importance and date/time of follow-up visits scheduled yes  Provider Appointment booked with PCP via virtual visit on 09/28/2018, DIL to be present for visit.   Confirmed with patient if condition begins to worsen call PCP or go to the ER.  Patient was given the office number and encouraged to call back with question or concerns.  : yes

## 2018-09-16 ENCOUNTER — Emergency Department (HOSPITAL_COMMUNITY): Payer: Medicare Other

## 2018-09-16 ENCOUNTER — Ambulatory Visit: Payer: Self-pay | Admitting: *Deleted

## 2018-09-16 ENCOUNTER — Other Ambulatory Visit: Payer: Self-pay

## 2018-09-16 ENCOUNTER — Inpatient Hospital Stay (HOSPITAL_COMMUNITY)
Admission: EM | Admit: 2018-09-16 | Discharge: 2018-09-19 | DRG: 872 | Disposition: A | Discharge status: Home Health | Payer: Medicare Other | Attending: Internal Medicine | Admitting: Internal Medicine

## 2018-09-16 ENCOUNTER — Encounter (HOSPITAL_COMMUNITY): Payer: Self-pay

## 2018-09-16 DIAGNOSIS — A4159 Other Gram-negative sepsis: Secondary | ICD-10-CM | POA: Diagnosis present

## 2018-09-16 DIAGNOSIS — N400 Enlarged prostate without lower urinary tract symptoms: Secondary | ICD-10-CM | POA: Diagnosis present

## 2018-09-16 DIAGNOSIS — N179 Acute kidney failure, unspecified: Secondary | ICD-10-CM | POA: Diagnosis present

## 2018-09-16 DIAGNOSIS — D630 Anemia in neoplastic disease: Secondary | ICD-10-CM | POA: Diagnosis present

## 2018-09-16 DIAGNOSIS — K219 Gastro-esophageal reflux disease without esophagitis: Secondary | ICD-10-CM | POA: Diagnosis present

## 2018-09-16 DIAGNOSIS — Z86006 Personal history of melanoma in-situ: Secondary | ICD-10-CM | POA: Diagnosis not present

## 2018-09-16 DIAGNOSIS — Z8507 Personal history of malignant neoplasm of pancreas: Secondary | ICD-10-CM

## 2018-09-16 DIAGNOSIS — A419 Sepsis, unspecified organism: Secondary | ICD-10-CM | POA: Diagnosis not present

## 2018-09-16 DIAGNOSIS — Z9049 Acquired absence of other specified parts of digestive tract: Secondary | ICD-10-CM | POA: Diagnosis not present

## 2018-09-16 DIAGNOSIS — D696 Thrombocytopenia, unspecified: Secondary | ICD-10-CM | POA: Diagnosis present

## 2018-09-16 DIAGNOSIS — I82409 Acute embolism and thrombosis of unspecified deep veins of unspecified lower extremity: Secondary | ICD-10-CM | POA: Diagnosis not present

## 2018-09-16 DIAGNOSIS — R627 Adult failure to thrive: Secondary | ICD-10-CM | POA: Diagnosis present

## 2018-09-16 DIAGNOSIS — D509 Iron deficiency anemia, unspecified: Secondary | ICD-10-CM | POA: Diagnosis present

## 2018-09-16 DIAGNOSIS — Z20828 Contact with and (suspected) exposure to other viral communicable diseases: Secondary | ICD-10-CM | POA: Diagnosis present

## 2018-09-16 DIAGNOSIS — F419 Anxiety disorder, unspecified: Secondary | ICD-10-CM | POA: Diagnosis present

## 2018-09-16 DIAGNOSIS — K8309 Other cholangitis: Secondary | ICD-10-CM

## 2018-09-16 DIAGNOSIS — R7989 Other specified abnormal findings of blood chemistry: Secondary | ICD-10-CM | POA: Diagnosis present

## 2018-09-16 DIAGNOSIS — R509 Fever, unspecified: Secondary | ICD-10-CM | POA: Diagnosis not present

## 2018-09-16 DIAGNOSIS — R911 Solitary pulmonary nodule: Secondary | ICD-10-CM | POA: Diagnosis present

## 2018-09-16 DIAGNOSIS — R945 Abnormal results of liver function studies: Secondary | ICD-10-CM | POA: Diagnosis present

## 2018-09-16 DIAGNOSIS — G2581 Restless legs syndrome: Secondary | ICD-10-CM | POA: Diagnosis present

## 2018-09-16 DIAGNOSIS — E876 Hypokalemia: Secondary | ICD-10-CM

## 2018-09-16 DIAGNOSIS — I10 Essential (primary) hypertension: Secondary | ICD-10-CM | POA: Diagnosis present

## 2018-09-16 DIAGNOSIS — R6520 Severe sepsis without septic shock: Secondary | ICD-10-CM | POA: Diagnosis present

## 2018-09-16 DIAGNOSIS — Z681 Body mass index (BMI) 19 or less, adult: Secondary | ICD-10-CM

## 2018-09-16 DIAGNOSIS — Z66 Do not resuscitate: Secondary | ICD-10-CM | POA: Diagnosis present

## 2018-09-16 DIAGNOSIS — C257 Malignant neoplasm of other parts of pancreas: Secondary | ICD-10-CM | POA: Diagnosis present

## 2018-09-16 DIAGNOSIS — D638 Anemia in other chronic diseases classified elsewhere: Secondary | ICD-10-CM | POA: Diagnosis not present

## 2018-09-16 DIAGNOSIS — Z79899 Other long term (current) drug therapy: Secondary | ICD-10-CM

## 2018-09-16 DIAGNOSIS — H9190 Unspecified hearing loss, unspecified ear: Secondary | ICD-10-CM | POA: Diagnosis present

## 2018-09-16 DIAGNOSIS — R600 Localized edema: Secondary | ICD-10-CM | POA: Diagnosis present

## 2018-09-16 DIAGNOSIS — J189 Pneumonia, unspecified organism: Secondary | ICD-10-CM | POA: Diagnosis not present

## 2018-09-16 DIAGNOSIS — B961 Klebsiella pneumoniae [K. pneumoniae] as the cause of diseases classified elsewhere: Secondary | ICD-10-CM | POA: Diagnosis present

## 2018-09-16 DIAGNOSIS — R609 Edema, unspecified: Secondary | ICD-10-CM | POA: Diagnosis not present

## 2018-09-16 DIAGNOSIS — M79673 Pain in unspecified foot: Secondary | ICD-10-CM | POA: Diagnosis not present

## 2018-09-16 DIAGNOSIS — Y95 Nosocomial condition: Secondary | ICD-10-CM

## 2018-09-16 LAB — CBC WITH DIFFERENTIAL/PLATELET
Abs Immature Granulocytes: 0.04 10*3/uL (ref 0.00–0.07)
Basophils Absolute: 0 10*3/uL (ref 0.0–0.1)
Basophils Relative: 1 %
Eosinophils Absolute: 0.1 10*3/uL (ref 0.0–0.5)
Eosinophils Relative: 3 %
HCT: 32.5 % — ABNORMAL LOW (ref 39.0–52.0)
Hemoglobin: 10.9 g/dL — ABNORMAL LOW (ref 13.0–17.0)
Immature Granulocytes: 1 %
Lymphocytes Relative: 4 %
Lymphs Abs: 0.2 10*3/uL — ABNORMAL LOW (ref 0.7–4.0)
MCH: 33.6 pg (ref 26.0–34.0)
MCHC: 33.5 g/dL (ref 30.0–36.0)
MCV: 100.3 fL — ABNORMAL HIGH (ref 80.0–100.0)
Monocytes Absolute: 0.4 10*3/uL (ref 0.1–1.0)
Monocytes Relative: 9 %
Neutro Abs: 3.4 10*3/uL (ref 1.7–7.7)
Neutrophils Relative %: 82 %
Platelets: 69 10*3/uL — ABNORMAL LOW (ref 150–400)
RBC: 3.24 MIL/uL — ABNORMAL LOW (ref 4.22–5.81)
RDW: 20.7 % — ABNORMAL HIGH (ref 11.5–15.5)
WBC: 4.1 10*3/uL (ref 4.0–10.5)
nRBC: 0 % (ref 0.0–0.2)

## 2018-09-16 LAB — URINALYSIS, ROUTINE W REFLEX MICROSCOPIC
Bilirubin Urine: NEGATIVE
Glucose, UA: NEGATIVE mg/dL
Hgb urine dipstick: NEGATIVE
Ketones, ur: NEGATIVE mg/dL
Leukocytes,Ua: NEGATIVE
Nitrite: NEGATIVE
Protein, ur: NEGATIVE mg/dL
Specific Gravity, Urine: 1.011 (ref 1.005–1.030)
pH: 6 (ref 5.0–8.0)

## 2018-09-16 LAB — POCT I-STAT 7, (LYTES, BLD GAS, ICA,H+H)
Acid-base deficit: 2 mmol/L (ref 0.0–2.0)
Bicarbonate: 21 mmol/L (ref 20.0–28.0)
Calcium, Ion: 1.15 mmol/L (ref 1.15–1.40)
HCT: 30 % — ABNORMAL LOW (ref 39.0–52.0)
Hemoglobin: 10.2 g/dL — ABNORMAL LOW (ref 13.0–17.0)
O2 Saturation: 98 %
Patient temperature: 101
Potassium: 3.3 mmol/L — ABNORMAL LOW (ref 3.5–5.1)
Sodium: 137 mmol/L (ref 135–145)
TCO2: 22 mmol/L (ref 22–32)
pCO2 arterial: 31.1 mmHg — ABNORMAL LOW (ref 32.0–48.0)
pH, Arterial: 7.443 (ref 7.350–7.450)
pO2, Arterial: 112 mmHg — ABNORMAL HIGH (ref 83.0–108.0)

## 2018-09-16 LAB — COMPREHENSIVE METABOLIC PANEL
ALT: 62 U/L — ABNORMAL HIGH (ref 0–44)
AST: 108 U/L — ABNORMAL HIGH (ref 15–41)
Albumin: 2.5 g/dL — ABNORMAL LOW (ref 3.5–5.0)
Alkaline Phosphatase: 555 U/L — ABNORMAL HIGH (ref 38–126)
Anion gap: 11 (ref 5–15)
BUN: 6 mg/dL — ABNORMAL LOW (ref 8–23)
CO2: 21 mmol/L — ABNORMAL LOW (ref 22–32)
Calcium: 8.1 mg/dL — ABNORMAL LOW (ref 8.9–10.3)
Chloride: 103 mmol/L (ref 98–111)
Creatinine, Ser: 1.15 mg/dL (ref 0.61–1.24)
GFR calc Af Amer: >60 mL/min (ref >60)
GFR calc non Af Amer: 55 mL/min — ABNORMAL LOW (ref >60)
Glucose, Bld: 235 mg/dL — ABNORMAL HIGH (ref 70–99)
Potassium: 3.5 mmol/L (ref 3.5–5.1)
Sodium: 135 mmol/L (ref 135–145)
Total Bilirubin: 3.3 mg/dL — ABNORMAL HIGH (ref 0.3–1.2)
Total Protein: 5.1 g/dL — ABNORMAL LOW (ref 6.5–8.1)

## 2018-09-16 LAB — CULTURE, BLOOD (ROUTINE X 2): Culture: NO GROWTH

## 2018-09-16 LAB — LACTIC ACID, PLASMA
Lactic Acid, Venous: 3.2 mmol/L (ref 0.5–1.9)
Lactic Acid, Venous: 1.7 mmol/L (ref 0.5–1.9)

## 2018-09-16 LAB — LIPASE, BLOOD: Lipase: 14 U/L (ref 11–51)

## 2018-09-16 LAB — SARS CORONAVIRUS 2 BY RT PCR (HOSPITAL ORDER, PERFORMED IN ~~LOC~~ HOSPITAL LAB): SARS Coronavirus 2: NEGATIVE

## 2018-09-16 LAB — MAGNESIUM: Magnesium: 1.6 mg/dL — ABNORMAL LOW (ref 1.7–2.4)

## 2018-09-16 MED ORDER — ACETAMINOPHEN 650 MG RE SUPP: 650.0000 mg | Freq: Four times a day (QID) | RECTAL | Status: DC | PRN

## 2018-09-16 MED ORDER — ADULT MULTIVITAMIN W/MINERALS CH
1.0000 | ORAL_TABLET | Freq: Every day | ORAL | Status: DC
  Administered 2018-09-17 – 2018-09-19 (×3): 1 via ORAL
  Filled 2018-09-16 (×3): qty 1

## 2018-09-16 MED ORDER — SODIUM CHLORIDE 0.9 % IV BOLUS
1000.0000 mL | Freq: Once | INTRAVENOUS | Status: AC
  Administered 2018-09-16: 18:00:00 1000 mL via INTRAVENOUS

## 2018-09-16 MED ORDER — SODIUM CHLORIDE 0.9 % IV BOLUS
1000.0000 mL | Freq: Once | INTRAVENOUS | Status: AC
  Administered 2018-09-16: 21:00:00 1000 mL via INTRAVENOUS

## 2018-09-16 MED ORDER — SODIUM CHLORIDE 0.9 % IV SOLN
2.0000 g | Freq: Once | INTRAVENOUS | Status: AC
  Administered 2018-09-16: 2 g via INTRAVENOUS
  Filled 2018-09-16: qty 2

## 2018-09-16 MED ORDER — METRONIDAZOLE IN NACL 5-0.79 MG/ML-% IV SOLN: 500.0000 mg | Freq: Once | INTRAVENOUS | Status: DC

## 2018-09-16 MED ORDER — PRAMIPEXOLE DIHYDROCHLORIDE 0.125 MG PO TABS
0.1250 mg | ORAL_TABLET | Freq: Every evening | ORAL | Status: DC | PRN
  Administered 2018-09-17: 0.125 mg via ORAL
  Filled 2018-09-16 (×2): qty 1

## 2018-09-16 MED ORDER — SODIUM CHLORIDE 0.9 % IV SOLN
INTRAVENOUS | Status: DC
  Administered 2018-09-16 – 2018-09-17 (×3): via INTRAVENOUS

## 2018-09-16 MED ORDER — VANCOMYCIN HCL 10 G IV SOLR: 1250.0000 mg | INTRAVENOUS | Status: DC

## 2018-09-16 MED ORDER — ACETAMINOPHEN 325 MG PO TABS
650.0000 mg | ORAL_TABLET | Freq: Four times a day (QID) | ORAL | Status: DC | PRN
  Administered 2018-09-18: 650 mg via ORAL
  Filled 2018-09-16 (×3): qty 2

## 2018-09-16 MED ORDER — FERROUS SULFATE 325 (65 FE) MG PO TABS
325.0000 mg | ORAL_TABLET | Freq: Every day | ORAL | Status: DC
  Administered 2018-09-17 – 2018-09-19 (×3): 325 mg via ORAL
  Filled 2018-09-16 (×3): qty 1

## 2018-09-16 MED ORDER — KETOROLAC TROMETHAMINE 30 MG/ML IJ SOLN
30.0000 mg | Freq: Once | INTRAMUSCULAR | Status: DC
  Filled 2018-09-16: qty 1

## 2018-09-16 MED ORDER — VANCOMYCIN HCL IN DEXTROSE 1-5 GM/200ML-% IV SOLN
1000.0000 mg | Freq: Once | INTRAVENOUS | Status: AC
  Administered 2018-09-16: 1000 mg via INTRAVENOUS
  Filled 2018-09-16: qty 200

## 2018-09-16 MED ORDER — IOHEXOL 300 MG/ML  SOLN
100.0000 mL | Freq: Once | INTRAMUSCULAR | Status: AC | PRN
  Administered 2018-09-16: 100 mL via INTRAVENOUS

## 2018-09-16 MED ORDER — ACETAMINOPHEN 325 MG PO TABS
650.0000 mg | ORAL_TABLET | Freq: Once | ORAL | Status: AC
  Administered 2018-09-16: 18:00:00 650 mg via ORAL
  Filled 2018-09-16: qty 2

## 2018-09-16 MED ORDER — SODIUM CHLORIDE 0.9 % IV SOLN
1.0000 g | Freq: Two times a day (BID) | INTRAVENOUS | Status: DC
  Administered 2018-09-17 – 2018-09-18 (×3): 1 g via INTRAVENOUS
  Filled 2018-09-16 (×7): qty 1

## 2018-09-16 MED ORDER — ENSURE ENLIVE PO LIQD
237.0000 mL | Freq: Two times a day (BID) | ORAL | Status: DC
  Administered 2018-09-17 – 2018-09-19 (×6): 237 mL via ORAL

## 2018-09-16 MED ORDER — PANTOPRAZOLE SODIUM 40 MG PO TBEC
40.0000 mg | DELAYED_RELEASE_TABLET | Freq: Every day | ORAL | Status: DC
  Administered 2018-09-17 – 2018-09-19 (×3): 40 mg via ORAL
  Filled 2018-09-16 (×3): qty 1

## 2018-09-16 MED ORDER — SODIUM CHLORIDE 0.9 % IV SOLN: 2.0000 g | Freq: Once | INTRAVENOUS | Status: DC

## 2018-09-16 NOTE — ED Notes (Signed)
Pt able to stand at bedside for UA.  Pulling at condom cath and removing it.  HR increased without dyspnea noted.  Large amounts of semi solid BM while standing.

## 2018-09-16 NOTE — H&P (Signed)
History and Physical    PLEASE NOTE THAT DRAGON DICTATION SOFTWARE WAS USED IN THE CONSTRUCTION OF THIS NOTE.   Jose [name] KGS:[id number] DOB: May 24, 1923 DOA: 09/16/2018  PCP: Jose Carbon, MD Patient coming from: home  I have personally briefly reviewed patient's old medical records in [place]  Chief Complaint: fever   HPI: Jose [name] is a 85 y.o. male with medical history significant for pancreatic adenocarcinoma complicated by common bile duct obstruction status post biliary stent on 08/27/2018, hypertension, chronic iron deficiency anemia, who was admitted to [facility] on 09/16/2018 with severe sepsis due to suspected left lower lobe pneumonia after presenting from home to [facility] emergency department for further evaluation of objective fever.  The following history is obtained via my discussions with the patient as well as my discussions with the patient's daughter-in-law, who is present at bedside, in addition to my discussions with the emergency department physician and via chart review.  The patient was recently hospitalized at [facility] from 09/10/2020 to 09/14/2018 for sepsis after presenting with objective fever.  Per review of discharge summary, source of underlying infection was undetermined, however blood cultures were positive for Enterobacter and Klebsiella for which the patient was initially started on IV vancomycin and meropenem before reported discontinuation of IV vancomycin on 09/12/2018.  He was found to be COVID-19 negative, influenza a and B negative, urinalysis was not suggestive of urinary tract infection, and chest x-ray reportedly demonstrated no evidence of underlying pneumonia. the patient was continued on meropenem for 48 hours following resolution of his objective fever, before being transitioned to ciprofloxacin and Flagyl at the time of discharge, with instructions to complete 10 additional days of these oral  antibiotics per infectious disease consult. Patient's case was discussed with GI regarding elevated liver enzymes during the [place] hospitalization, and reportedly felt that there was no indication for ERCP given that bilirubin was stable, and trending down slightly over hospital course. He follows with Dr. Watt Climes as his outpatient gastroenterologist. On 09/14/2018, the patient was discharged to home, where he lives independently, with home health PT.   Over the course of 09/15/2018, the patient reports feeling slightly weak in a general sense, but otherwise asymptomatic, including no subjective fever, chills, rigors, or generalized myalgias.  He qualifies this by reporting that he felt well enough yesterday to mow his lawn via [employer], [employer].  However, starting this morning, the patient reports development of subjective fever.  At the time of his home health physical therapy appointment, PT provider found patient to exhibit fever of 104 by oral temperature, prompting the patient to be brought to [facility] emergency department for further evaluation.  The patient denies any headache, neck stiffness, rhinitis, rhinorrhea, sore throat, or dysuria.  He reports mild nonproductive cough as well as mild shortness of breath, in the absence of any associated orthopnea, PND, or peripheral edema.  He denies any associated chest pain, palpitations, diaphoresis, or dizziness.  denies any associated nausea, vomiting, diarrhea, or abdominal pain.    Notable medical history includes recent diagnosis of pancreatic adenocarcinoma, which appears to have been made during the first week of April. In setting of CBD obstruction, he underwent placement of biliary stent on 08/27/18. Outpatient oncologist is Dr. Betsy [name], whom patient most recently saw on 08/26/18. Per review of his documentation from that appointment, Dr. Benay [name] recommended refraining from chemotherapy, but rather employment of observation with 4 week follow-up  due to concerns regarding patient's ability to  tolerate chemotherapy given his advanced age. Next follow-up appointment with Dr. Benay [name] scheduled for 09/29/18.    ED Course: VS in ED notable for Tmax 102.9; initial HR 130, which decreased to 107 following interval IVF's, as described below; BP 118/83 - 148/72; RR 20-36; O2 95-100% on RA.   Labs in ED were notable for the following: CMP notable for Na 135, bicarbonate 21, AG 11, BUN 6, Cr 1.15 relative to 0.66 on 09/14/18, alk phos 555 relative to range during recent [place] hospitalization of 355-401, AST 108 relative to range during recent [place] hospitalization of 33-228, ALT 62 relative to range during recent [place] hospitalization of 45-91, T bili 3.3 relative to range during recent [place] hospitalization of 2.2-8.9. CBC showed wbc of 4,100 with 82% neutrophis, Hgb 10.9 compared to 10.2 on 09/14/18, and platelets 69 relative to 90 on 09/14/18. LA 3.2; UA showed no wbc's. Blood cx's x 2 collected prior to initiation of any abx. In-house COVID-19 test found to be negative.   CXR, per final radiology report showed interval left basilar airspace opacity concerning for infiltrate and possible small left pleural effusion in absence of any evidence of edema. CT abd/pelvis with IV contrast this evening, per final radiology report and in comparison to CT abd/pelvis on 08/18/18, showed interval placement of common bile duct stent, with slight increase in intrahepatic/proximal extrahepatic duct dilation, and new small volume of ascites.   ED physician discussed susceptibilities of repeat blood cx's positive for Enterobacter and Klebsiella, confirming susceptibility of both to Meropenem.   While still in the ED, the following were administered: Meropenem 2 g IV x 1, Vancomycin 1 g IV x 1, NS bolus x 2 L, and acetaminophen 650 mg PO x 1.   Subsequently, the patient was admitted to med-tele for further evaluation and management of presenting severe sepsis to  suspected LLL pna.        Review of Systems: As per HPI otherwise 10 point review of systems negative.   Past Medical History:  Diagnosis Date   Anxiety    Back problem 1960   BPH (benign prostatic hypertrophy)    Cervical radiculopathy    with neuropathy   Dyspnea    GERD (gastroesophageal reflux disease)    HOH (hard of hearing)    Hypertension    Hypokalemia    Melanoma in situ ([place]) 12/2016   neck   Pancreatic adenocarcinoma ([place])    per patient D-I-L [name] , dx after last ercp, first oncology appt on 08-26-2018 at [facility]    Past Surgical History:  Procedure Laterality Date   BILIARY STENT PLACEMENT  03/30/2018   Procedure: [place];  Surgeon: Clarene Essex, MD;  Location: [place];  Service: Endoscopy;;   BILIARY STENT PLACEMENT N/A 08/27/2018   Procedure: BILIARY STENT PLACEMENT;  Surgeon: Clarene Essex, MD;  Location: [facility];  Service: Endoscopy;  Laterality: N/A;   CATARACT EXTRACTION     OD   CHOLECYSTECTOMY N/A 01/26/2018   Procedure: LAPAROSCOPIC CHOLECYSTECTOMY;  Surgeon: Coralie Keens, MD;  Location: [place];  Service: General;  Laterality: N/A;   ENDOSCOPIC RETROGRADE CHOLANGIOPANCREATOGRAPHY (ERCP) WITH PROPOFOL N/A 03/13/2018   Procedure: ENDOSCOPIC RETROGRADE CHOLANGIOPANCREATOGRAPHY (ERCP) WITH PROPOFOL;  Surgeon: Lucilla Lame, MD;  Location: [facility] ENDOSCOPY;  Service: Endoscopy;  Laterality: N/A;   ERCP N/A 03/29/2018   Procedure: ENDOSCOPIC RETROGRADE CHOLANGIOPANCREATOGRAPHY (ERCP);  Surgeon: Ronnette Juniper, MD;  Location: [place];  Service: Gastroenterology;  Laterality: N/A;   ERCP N/A 03/30/2018  Procedure: ENDOSCOPIC RETROGRADE CHOLANGIOPANCREATOGRAPHY (ERCP);  Surgeon: Clarene Essex, MD;  Location: [place];  Service: Endoscopy;  Laterality: N/A;   ERCP N/A 08/10/2018   Procedure: ENDOSCOPIC RETROGRADE CHOLANGIOPANCREATOGRAPHY (ERCP);  Surgeon: Clarene Essex, MD;  Location: [facility];  Service:  Endoscopy;  Laterality: N/A;  Balloon sweep for stones   ERCP N/A 08/27/2018   Procedure: ENDOSCOPIC RETROGRADE CHOLANGIOPANCREATOGRAPHY (ERCP) with STENT PLACEMENT;  Surgeon: Clarene Essex, MD;  Location: [facility];  Service: Endoscopy;  Laterality: N/A;   IR PERC CHOLECYSTOSTOMY  12/28/2017   LAPAROSCOPIC CHOLECYSTECTOMY  01/26/2018   MELANOMA EXCISION  12/2016   in situ---Dr Dasher   SPHINCTEROTOMY  03/30/2018   Procedure: Joan Mayans;  Surgeon: Clarene Essex, MD;  Location: [facility] ENDOSCOPY;  Service: Endoscopy;;   STENT REMOVAL  03/29/2018   Procedure: STENT REMOVAL;  Surgeon: Ronnette Juniper, MD;  Location: [facility]COPY;  Service: Gastroenterology;;   Lavell Islam REMOVAL  08/10/2018   Procedure: STENT REMOVAL;  Surgeon: Clarene Essex, MD;  Location: [facility];  Service: Endoscopy;;   TONSILLECTOMY      Social History:  reports that he has never smoked. He has never used smokeless tobacco. He reports that he does not drink alcohol or use drugs.   No Known Allergies  Family History  Problem Relation Age of Onset   Cancer Son    Coronary artery disease Neg Hx    Diabetes Neg Hx      Prior to Admission medications   Medication Sig Start Date End Date Taking? Authorizing Provider  acetaminophen (TYLENOL) 325 MG tablet Take 650 mg by mouth every 6 (six) hours as needed.    [provider]  ciprofloxacin (CIPRO) 500 MG tablet Take 1 tablet (500 mg total) by mouth 2 (two) times daily for 10 days. 09/14/18 09/24/18  Vaughan Basta, MD  feeding supplement, ENSURE ENLIVE, (ENSURE ENLIVE) LIQD Take 6 mLs by mouth 2 (two) times daily between meals. Patient taking differently: Take 237 mLs by mouth daily.  03/17/18   Loletha Grayer, MD  ferrous sulfate 325 (65 FE) MG EC tablet Take 325 mg by mouth daily with breakfast.    [provider]  gabapentin (NEURONTIN) 600 MG tablet Take 1 tablet (600 mg total) by mouth every morning. And 2 at bedtime Patient taking  differently: Take 600 mg by mouth 2 (two) times daily.  06/04/18   Jose Carbon, MD  metoprolol succinate (TOPROL-XL) 25 MG 24 hr tablet TAKE ONE TABLET BY MOUTH DAILY 05/28/18   Jose Carbon, MD  metroNIDAZOLE (FLAGYL) 500 MG tablet Take 1 tablet (500 mg total) by mouth 3 (three) times daily for 10 days. 09/14/18 09/24/18  Vaughan Basta, MD  Multiple Vitamin (MULTIVITAMIN WITH MINERALS) TABS tablet Take 1 tablet by mouth daily. 03/17/18   Loletha Grayer, MD  omeprazole (PRILOSEC) 20 MG capsule TAKE ONE CAPSULE BY MOUTH DAILY 09/15/18   Viviana Simpler I, MD  pramipexole (MIRAPEX) 0.125 MG tablet Take 1 tablet (0.125 mg total) by mouth at bedtime as needed. Patient taking differently: Take 0.125 mg by mouth at bedtime as needed (restless legs).  06/18/18   Jose Carbon, MD  Probiotic Product (ALIGN) 4 MG CAPS Take 4 mg by mouth daily.    [provider]  tamsulosin (FLOMAX) 0.4 MG CAPS capsule Take 1 capsule (0.4 mg total) by mouth daily. 06/26/18   Jose Carbon, MD  traMADol (ULTRAM) 50 MG tablet Take 1 tablet (50 mg total) by mouth every 12 (twelve) hours as needed for  moderate pain. 09/02/18   Owens Shark, NP  vitamin B-12 (CYANOCOBALAMIN) 500 MCG tablet Take 500 mcg by mouth daily.    [provider]     Objective    Physical Exam: Vitals:   09/16/18 1800 09/16/18 1830 09/16/18 1845 09/16/18 1850  BP: 139/77 (!) 143/73 (!) 148/72   Pulse: (!) 124 (!) 120 (!) 117   Resp: (!) 35 (!) 38 (!) 36   Temp:    (!) 102.9 F (39.4 C)  TempSrc:    Oral  SpO2: 100% 100% 100%   Weight:      Height:        General: appears to be stated age; alert and oriented to person, place, time, and self. Skin: warm, dry, no rash Head:  AT/[place] Mouth:  Oral mucosa membranes appear dry, normal dentition Neck: supple; trachea midline Heart:  Tachycardic, but regular; 2/6 systolic murmur noted. Lungs: left basilar rales noted; did not appreciate any wheezes or  rhonchi.  Abdomen: + BS; soft, ND, NT Extremities: no peripheral edema, no muscle wasting   Labs on Admission: I have personally reviewed following labs and imaging studies  CBC: Recent Labs  Lab 09/11/18 2210 09/12/18 0453 09/12/18 0614 09/13/18 0124 09/14/18 0446 09/16/18 1737 09/16/18 1815  WBC 3.9* 5.4  --  6.0 4.3 4.1  --   NEUTROABS 2.7  --   --   --   --  3.4  --   HGB 10.5* 7.3* 7.0* 9.6* 10.2* 10.9* 10.2*  HCT 31.3* 22.2* 21.2* 28.1* 29.6* 32.5* 30.0*  MCV 108.7* 107.2*  --  98.3 99.0 100.3*  --   PLT 143* 98*  --  95* 90* 69*  --    Basic Metabolic Panel: Recent Labs  Lab 09/11/18 2304 09/12/18 0453 09/13/18 0124 09/14/18 0446 09/16/18 1737 09/16/18 1815  NA 137 137 135 135 135 137  K 3.2* 3.5 4.2 4.3 3.5 3.3*  CL 111 109 110 109 103  --   CO2 19* '22 22 22 '$ 21*  --   GLUCOSE 126* 104* 117* 118* 235*  --   BUN '9 9 14 12 '$ 6*  --   CREATININE 0.78 0.71 0.70 0.66 1.15  --   CALCIUM 6.6* 7.1* 7.1* 7.5* 8.1*  --   MG 1.3*  --   --   --   --   --    GFR: Estimated Creatinine Clearance: 32.5 mL/min (by C-G formula based on SCr of 1.15 mg/dL). Liver Function Tests: Recent Labs  Lab 09/11/18 2304 09/12/18 0453 09/13/18 0124 09/14/18 0446 09/16/18 1737  AST 228* 150* 65* 33 108*  ALT 91* 86* 65* 45* 62*  ALKPHOS 401* 388* 358* 355* 555*  BILITOT 2.2* 2.6* 8.9* 2.5* 3.3*  PROT 4.1* 4.0* 4.2* 4.4* 5.1*  ALBUMIN 2.1* 1.9* 2.0* 2.2* 2.5*   Recent Labs  Lab 09/16/18 1737  LIPASE 14   No results for input(s): AMMONIA in the last 168 hours. Coagulation Profile: Recent Labs  Lab 09/11/18 2210 09/12/18 0453 09/13/18 0813  INR 1.3* 1.6* 1.4*   Cardiac Enzymes: Recent Labs  Lab 09/11/18 2304  TROPONINI <0.03   BNP (last 3 results) No results for input(s): PROBNP in the last 8760 hours. HbA1C: No results for input(s): HGBA1C in the last 72 hours. CBG: No results for input(s): GLUCAP in the last 168 hours. Lipid Profile: No results for input(s):  CHOL, HDL, LDLCALC, TRIG, CHOLHDL, LDLDIRECT in the last 72 hours. Thyroid Function Tests: No results for  input(s): TSH, T4TOTAL, FREET4, T3FREE, THYROIDAB in the last 72 hours. Anemia Panel: No results for input(s): VITAMINB12, FOLATE, FERRITIN, TIBC, IRON, RETICCTPCT in the last 72 hours. Urine analysis:    Component Value Date/Time   COLORURINE YELLOW 09/16/2018 1737   APPEARANCEUR CLEAR 09/16/2018 1737   LABSPEC 1.011 09/16/2018 1737   PHURINE 6.0 09/16/2018 1737   GLUCOSEU NEGATIVE 09/16/2018 1737   HGBUR NEGATIVE 09/16/2018 1737   BILIRUBINUR NEGATIVE 09/16/2018 1737   KETONESUR NEGATIVE 09/16/2018 1737   PROTEINUR NEGATIVE 09/16/2018 1737   NITRITE NEGATIVE 09/16/2018 1737   LEUKOCYTESUR NEGATIVE 09/16/2018 1737    Radiological Exams on Admission: Dg Chest Port 1 View  Result Date: 09/16/2018 CLINICAL DATA:  85 year old male with shortness of breath and fever EXAM: PORTABLE CHEST 1 VIEW COMPARISON:  09/11/2018 FINDINGS: Cardiomediastinal silhouette unchanged in size and contour. No evidence of central vascular congestion. No pneumothorax. Low lung volumes with coarsened interstitial markings and no new confluent airspace disease. Left basilar opacity with blunting of the costophrenic angle. IMPRESSION: Low lung volumes with left basilar opacity, potentially small pleural fluid and associated atelectasis/consolidation Electronically Signed   By: Corrie Mckusick D.O.   On: 09/16/2018 18:28     Assessment/Plan   Dickson Kostelnik Shropshire is a 85 y.o. male with medical history significant for pancreatic adenocarcinoma complicated by common bile duct obstruction status post biliary stent on 08/27/2018, hypertension, chronic iron deficiency anemia, who was admitted to [facility] on 09/16/2018 with severe sepsis due to suspected left lower lobe pneumonia after presenting from home to [facility] emergency department for further evaluation of objective fever.   Principal Problem:   HCAP  (healthcare-associated pneumonia) Active Problems:   Essential hypertension, benign   GERD   Anemia   BPH (benign prostatic hyperplasia)   Severe sepsis (HCC)   Fever   AKI (acute kidney injury) (HCC)   Elevated lactic acid level   #) Severe sepsis due to suspected LLL pneumonia: diagnosis on basis of new cough SOB, non-productive cough, with presenting CXR suggestive of interval development of LLL airspace opacity concerning for infiltrate. SIRS criteria met via objective fever, tachycardia, and tachypnea. Patient's sepsis meets criteria to be considered severe in nature on basis of elevated presenting LA of 3.2 as well as additional evidence of end organ damage in the form of AKI. No evidence of hypotension. Will consider patient's pna to be health-care associated given recent hospitalization at [facility]. In the ED patient has received 2L IV NS bolus, meeting threshold for 30 mL/kg IVF bolus. Blood cultures x 2 collected followed by initiation IV Vanc and Meropenem, providing broad coverage with the exception of lacking atypical coverage. However, given radiographic appearance of infiltrate, will refrain from addition of atypical coverage for now.   Of additional note, during recent hospitalization at [place], blood cultures were positive for Enterobacter and Klebsiella, reportedly of unclear origin, and patient was ultimately discharged to home on 10 additional days of Ciprofloxacin and Flagyl, with Day #1 being 09/14/18. As described above, these previous blood cultures/sensitives were discussed with in-house pharmacist, who confirmed that Meropenem should provide adequate coverage.   Regarding other potential infectious sources leading to this evening's presentation: COVID-19 testing was repeated this evening and found to be negative. UA was not suggestive of UTI. Biliary source is a possibility given slight interval increase in alk phos, bilirubin relative to discharge values on 09/14/18. CT abd  today without definitive evidence of biliary stent stenosis, however, will repeat CMP in the morning  to further correlate for this possibility. Of note, patient denies any abdominal pain and physical exam of the abdomen is unremarkable.     Plan: In the setting of HCAP pneumonia, will continue IV vancomycin and meropenem.  Check MRSA PCR, with plan to discontinue vancomycin if PCR negative due to the high negative predictive value associated with this finding.  Repeat lactic acid.  IV normal saline at 100 cc/h.  Will follow for results blood cultures x2 collected in the ED this evening.  Repeat CBC in the morning.  Check strep pneumonia urine antigen.  Droplet precautions.  PRN supplemental oxygen in order to maintain oxygen saturations greater than or equal to 92%. Prn acetaminophen for fever.  Repeat CMP in the morning for monitoring of interval liver enzymes.      #) Acute Kidney Injury: presenting Cr 1.15 relative to most recent prior value of 0.66 on 09/14/18. Suspect prerenal in setting of intravascular depletion secondary to presenting severe sepsis. Of note, UA performed today demonstrated no evidence of casts. Of note, Foley catheter was placed in the ED this evening. Patient denies any historical need for chronic indwelling foley catheter.   Plan: IVF's as above. Work-up/management of severe sepsis due to suspected LLL pna, as above. Check random urine Na and Cr. Monitor strict I&O's. Attempt to avoid nephrotoxic agents. Repeat BMP in the morning.      #) history of Enterobacter/Klebsiella bacteremia: per blood cultures drawn on day of admission at [facility] on 09/11/18. As described above, source not completely clear, although biliary is a possibility. Received 4 days of Meropenem before being discharged to home on 09/14/18 on 10 additional days of Cipro/Flagyl in order to complete a total antibiotic course of 14 days. Meropenem initiated this evening for HCAP pna will provide adequate  Enterobacter/Klebsiella coverage, as confirmed via in-house pharmacy consultation, as above.   Plan: IV Meropenem, as above. Will follow for results of blood cultures x 2 collected today. Repeat CMP in the morning for trending of liver enzymes and correlation of such with clinical picture for determination of necessity for additional GI involvement for ERCP.     #) Pancreatic Adenocarcinoma: appears to have been diagnosed with such in early April 2020, and follows with Dr. Benay [name] as outpatient oncologist, who suspects stage 4 disease in setting of right lung nodule. Per review of documentation from outpatient oncology appointment on 08/26/18, Dr. Benay [name] recommended refraining from chemotherapy, but rather recommended observations with plan for 4 week follow-up given his concerns for patient's ability to tolerate chemotherapy given his advanced age. Oncology follow-up appt scheduled to occur on 09/29/18. Of note, course associated with biliary obstruction, prompting biliary stent placement on 08/27/18.   Plan: management per outpatient oncology, as above. Repeat CMP in the AM.     #) Essential hypertension: outpatient antihypertensive regimen appears to consist of Toprol XL, although it appears that patient is also on Flomax in setting of history of BPH. He has been normotensive since presentation this evening.   Plan: will hold home Toprol XL and Flomax in context of presenting severe sepsis. Close monitoring of ensuing blood pressures.     #) Chronic iron deficiency anemia: on oral iron supplementation as an outpatient, with baseline Hgb appearing to be 10-11. Presenting Hgb of 10.9 consistent with this baseline range, and slightly higher relative to most recent prior Hgb of 10.2 on 09/14/18. No evidence to suggest active bleed at this time.   Plan: repeat CBC in the morning. Continue outpatient  oral iron supplementation.      #) GERD: on omeprazole as outpatient.   Plan: continue home  PPI.     3) BPH: on Flomax at home. As noted above, foley catheter placed in the ED this evening in absence of need for chronic indwelling foley catheter.   Plan: hold home Flomax for now in setting of presenting severe sepsis. Monitor strict I&O's. Repeat BMP in the morning.      DVT prophylaxis: scd's Code Status: DNR/DNI (confirmed per my discussions with the patient this evening). Family Communication: patient's case was discussed with his daughter-in-law, who was present at bedside.  Disposition Plan:  Per Rounding Team Consults called: (none)  Admission status: inpatient; med-tele.    PLEASE NOTE THAT DRAGON DICTATION SOFTWARE WAS USED IN THE CONSTRUCTION OF THIS NOTE.   Rhetta Mura DO [facility] Pager [phone] From [place].    09/16/2018, 7:50 PM

## 2018-09-16 NOTE — ED Provider Notes (Signed)
[place] EMERGENCY DEPARTMENT Provider Note   CSN: [id number] Arrival date & time: 09/16/18  1709    History   Chief Complaint Chief Complaint  Patient presents with   Shortness of Breath   Fever    HPI Jose [name] is a 85 y.o. male with h/o pancreatic cancer with stent in CMD under supportive/comfort measures, cholecystectomy, BPH, HTN, anemia, recent hospital discharge for Enterobacter and Klebsiella bacteremia s/p meropenem brought from home for evaluation of fever up to 104 orally. Associated with heart rate in 120s and drowsiness.  Patient awake and follows simple commands, oriented to name only.  Level 5 caveat due to acuity of condition. Daughter in law at bedside provides history. Patient reportedly feeling weak since hospital discharge on Friday but otherwise doing well.  He lives independently and has been taking ciprofloxacin and flagyl as prescribed.  Yolanda Bonine was with him this morning and he seemed well. PT went to work with him and noticed fever 104. No interventions. No modifying factors.  Patient's son is POA. DNR/DNI per last discharge summary on 09/11/2018 and daughter in law confirms this, states patient does not want aggressive or invasive resuscitation efforts. Agreeable to IVF resuscitation, antibiotics and imaging as needed.      HPI  Past Medical History:  Diagnosis Date   Anxiety    Back problem 1960   BPH (benign prostatic hypertrophy)    Cervical radiculopathy    with neuropathy   Dyspnea    GERD (gastroesophageal reflux disease)    HOH (hard of hearing)    Hypertension    Hypokalemia    Melanoma in situ ([place]) 12/2016   neck   Pancreatic adenocarcinoma ([place])    per patient D-I-L [name] , dx after last ercp, first oncology appt on 08-26-2018 at [facility]    Patient Active Problem List   Diagnosis Date Noted   Severe sepsis ([place]) 09/16/2018   HCAP (healthcare-associated pneumonia) 09/16/2018   Fever  09/16/2018   AKI (acute kidney injury) ([place]) 09/16/2018   Elevated lactic acid level 09/16/2018   Sepsis ([place]) 09/12/2018   Pressure injury of skin 09/12/2018   Pancreatic adenocarcinoma ([place]) 08/24/2018   BPH (benign prostatic hyperplasia) 04/23/2018   Obstructive jaundice 03/28/2018   Tachycardia 03/24/2018   Malnutrition of moderate degree 03/14/2018   Pancreatitis due to biliary obstruction 03/12/2018   Malnutrition of mild degree ([place]) 01/06/2018   Melanoma of neck ([place])    Rash 09/05/2016   Neuropathy 05/07/2016   Anemia 05/11/2015   Preventative health care 04/29/2014   Advanced directives, counseling/discussion 04/29/2014   Osteoarthrosis involving multiple sites 10/31/2010   Osteoarthrosis of knee 10/31/2010   ACTINIC KERATOSIS 12/04/2007   BPH with obstruction/lower urinary tract symptoms 10/01/2006   Essential hypertension, benign 09/17/2006   GERD 09/17/2006   Cervical radiculopathy 09/17/2006    Past Surgical History:  Procedure Laterality Date   BILIARY STENT PLACEMENT  03/30/2018   Procedure: BILIARY STENT PLACEMENT;  Surgeon: Clarene Essex, MD;  Location: [facility];  Service: Endoscopy;;   BILIARY STENT PLACEMENT N/A 08/27/2018   Procedure: BILIARY STENT PLACEMENT;  Surgeon: Clarene Essex, MD;  Location: [facility] ENDOSCOPY;  Service: Endoscopy;  Laterality: N/A;   CATARACT EXTRACTION     OD   CHOLECYSTECTOMY N/A 01/26/2018   Procedure: LAPAROSCOPIC CHOLECYSTECTOMY;  Surgeon: Coralie Keens, MD;  Location: [facility];  Service: General;  Laterality: N/A;   ENDOSCOPIC RETROGRADE CHOLANGIOPANCREATOGRAPHY (ERCP) WITH PROPOFOL N/A 03/13/2018   Procedure: ENDOSCOPIC RETROGRADE CHOLANGIOPANCREATOGRAPHY (  ERCP) WITH PROPOFOL;  Surgeon: Lucilla Lame, MD;  Location: [facility] ENDOSCOPY;  Service: Endoscopy;  Laterality: N/A;   ERCP N/A 03/29/2018   Procedure: ENDOSCOPIC RETROGRADE CHOLANGIOPANCREATOGRAPHY (ERCP);  Surgeon: Ronnette Juniper, MD;  Location: [facility];  Service: Gastroenterology;  Laterality: N/A;   ERCP N/A 03/30/2018   Procedure: ENDOSCOPIC RETROGRADE CHOLANGIOPANCREATOGRAPHY (ERCP);  Surgeon: Clarene Essex, MD;  Location: [facility];  Service: Endoscopy;  Laterality: N/A;   ERCP N/A 08/10/2018   Procedure: ENDOSCOPIC RETROGRADE CHOLANGIOPANCREATOGRAPHY (ERCP);  Surgeon: Clarene Essex, MD;  Location: [facility];  Service: Endoscopy;  Laterality: N/A;  Balloon sweep for stones   ERCP N/A 08/27/2018   Procedure: ENDOSCOPIC RETROGRADE CHOLANGIOPANCREATOGRAPHY (ERCP) with STENT PLACEMENT;  Surgeon: Clarene Essex, MD;  Location: [facility]OS[facility];  Service: Endoscopy;  Laterality: N/A;   IR PERC CHOLECYSTOSTOMY  12/28/2017   LAPAROSCOPIC CHOLECYSTECTOMY  01/26/2018   MELANOMA EXCISION  12/2016   in situ---Dr Dasher   SPHINCTEROTOMY  03/30/2018   Procedure: Joan Mayans;  Surgeon: Clarene Essex, MD;  Location: [facility] ENDOSCOPY;  Service: Endoscopy;;   STENT REMOVAL  03/29/2018   Procedure: STENT REMOVAL;  Surgeon: Ronnette Juniper, MD;  Location: [place];  Service: Gastroenterology;;   Lavell Islam REMOVAL  08/10/2018   Procedure: STENT REMOVAL;  Surgeon: Clarene Essex, MD;  Location: [facility] ENDOSCOPY;  Service: Endoscopy;;   TONSILLECTOMY          Home Medications    Prior to Admission medications   Medication Sig Start Date End Date Taking? Authorizing Provider  acetaminophen (TYLENOL) 325 MG tablet Take 650 mg by mouth every 6 (six) hours as needed.    [provider]  ciprofloxacin (CIPRO) 500 MG tablet Take 1 tablet (500 mg total) by mouth 2 (two) times daily for 10 days. 09/14/18 09/24/18  Vaughan Basta, MD  feeding supplement, ENSURE ENLIVE, (ENSURE ENLIVE) LIQD Take 6 mLs by mouth 2 (two) times daily between meals. Patient taking differently: Take 237 mLs by mouth daily.  03/17/18   Loletha Grayer, MD  ferrous sulfate 325 (65 FE) MG EC tablet Take 325 mg by mouth daily with breakfast.    [provider]    gabapentin (NEURONTIN) 600 MG tablet Take 1 tablet (600 mg total) by mouth every morning. And 2 at bedtime Patient taking differently: Take 600 mg by mouth 2 (two) times daily.  06/04/18   [name], MD  metoprolol succinate (TOPROL-XL) 25 MG 24 hr tablet TAKE ONE TABLET BY MOUTH DAILY 05/28/18   [name], MD  metroNIDAZOLE (FLAGYL) 500 MG tablet Take 1 tablet (500 mg total) by mouth 3 (three) times daily for 10 days. 09/14/18 09/24/18  Vaughan Basta, MD  Multiple Vitamin (MULTIVITAMIN WITH MINERALS) TABS tablet Take 1 tablet by mouth daily. 03/17/18   Loletha Grayer, MD  omeprazole (PRILOSEC) 20 MG capsule TAKE ONE CAPSULE BY MOUTH DAILY 09/15/18   Viviana Simpler I, MD  pramipexole (MIRAPEX) 0.125 MG tablet Take 1 tablet (0.125 mg total) by mouth at bedtime as needed. Patient taking differently: Take 0.125 mg by mouth at bedtime as needed (restless legs).  06/18/18   [name], MD  Probiotic Product (ALIGN) 4 MG CAPS Take 4 mg by mouth daily.    [provider]  tamsulosin (FLOMAX) 0.4 MG CAPS capsule Take 1 capsule (0.4 mg total) by mouth daily. 06/26/18   [name], MD  traMADol (ULTRAM) 50 MG tablet Take 1 tablet (50 mg total) by mouth every 12 (twelve) hours as needed for moderate  pain. 09/02/18   Owens Shark, NP  vitamin B-12 (CYANOCOBALAMIN) 500 MCG tablet Take 500 mcg by mouth daily.    [provider]    Family History Family History  Problem Relation Age of Onset   Cancer Son    Coronary artery disease Neg Hx    Diabetes Neg Hx     Social History Social History   Tobacco Use   Smoking status: Never Smoker   Smokeless tobacco: Never Used  Substance Use Topics   Alcohol use: No   Drug use: No     Allergies   Patient has no known allergies.   Review of Systems Review of Systems  Constitutional: Positive for fever.  Psychiatric/Behavioral: Positive for confusion.  All other systems reviewed and are  negative.    Physical Exam Updated Vital Signs BP (!) 108/54 (BP Location: Left Arm)    Pulse 99    Temp 98.8 F (37.1 C) (Oral)    Resp 18    Ht _0  (1.778 m)    Wt 56 kg    SpO2 96%    BMI 17.71 kg/m   Physical Exam Vitals signs and nursing note reviewed.  Constitutional:      Appearance: He is well-developed. He is ill-appearing.     Comments: Elderly frail appearing. Daughter in law at bedside   HENT:     Head: Normocephalic and atraumatic.     Right Ear: External ear normal.     Left Ear: External ear normal.     Nose: Nose normal.     Mouth/Throat:     Mouth: Mucous membranes are dry.     Comments: Multiple missing teeth  Eyes:     General: No scleral icterus.    Conjunctiva/sclera: Conjunctivae normal.  Neck:     Musculoskeletal: Normal range of motion and neck supple.  Cardiovascular:     Rate and Rhythm: Normal rate and regular rhythm.     Heart sounds: Normal heart sounds. No murmur.     Comments: Feels warm to touch throughout. 1+ radial and DP pulses bilaterally. Trace pitting edema to feet, symmetric.  Pulmonary:     Effort: Pulmonary effort is normal. Tachypnea present.     Breath sounds: Examination of the right-lower field reveals decreased breath sounds. Examination of the left-lower field reveals decreased breath sounds. Decreased breath sounds present.     Comments: RR in 39s. SpO2 > 95% at rest, transient drop to 92% with repositioning. Diminished lungs to lower/lateral lobes bilaterally but worse L>R. No crackles, no wheezing.  Abdominal:     General: Abdomen is flat.     Palpations: Abdomen is soft.     Tenderness: There is no abdominal tenderness.     Comments: No distention. No tenderness or wincing during exam. No suprapubic or CVA tenderness  Genitourinary:    Comments: Incontinent of urine and feces. Minimal erythema to sacral area but no frank wound, break down, oozing.  Musculoskeletal: Normal range of motion.        General: No deformity.    Skin:    General: Skin is warm and dry.     Capillary Refill: Capillary refill takes less than 2 seconds.  Neurological:     Mental Status: He is alert. He is disoriented.     Comments: Awake, follows basic commands. Oriented to name only. PERRL and EOMs intact bilaterally. Weak but symmetric hand grip bilaterally. Can wiggle toes, weak ankle plantar flexion bilaterally but symmetric.  Psychiatric:  Behavior: Behavior normal.        Thought Content: Thought content normal.        Judgment: Judgment normal.      ED Treatments / Results  Labs (all labs ordered are listed, but only abnormal results are displayed) Labs Reviewed  LACTIC ACID, PLASMA - Abnormal; Notable for the following components:      Result Value   Lactic Acid, Venous 3.2 (*)    All other components within normal limits  COMPREHENSIVE METABOLIC PANEL - Abnormal; Notable for the following components:   CO2 21 (*)    Glucose, Bld 235 (*)    BUN 6 (*)    Calcium 8.1 (*)    Total Protein 5.1 (*)    Albumin 2.5 (*)    AST 108 (*)    ALT 62 (*)    Alkaline Phosphatase 555 (*)    Total Bilirubin 3.3 (*)    GFR calc non Af Amer 55 (*)    All other components within normal limits  CBC WITH DIFFERENTIAL/PLATELET - Abnormal; Notable for the following components:   RBC 3.24 (*)    Hemoglobin 10.9 (*)    HCT 32.5 (*)    MCV 100.3 (*)    RDW 20.7 (*)    Platelets 69 (*)    Lymphs Abs 0.2 (*)    All other components within normal limits  MAGNESIUM - Abnormal; Notable for the following components:   Magnesium 1.6 (*)    All other components within normal limits  POCT I-STAT 7, (LYTES, BLD GAS, ICA,H+H) - Abnormal; Notable for the following components:   pCO2 arterial 31.1 (*)    pO2, Arterial 112.0 (*)    Potassium 3.3 (*)    HCT 30.0 (*)    Hemoglobin 10.2 (*)    All other components within normal limits  SARS CORONAVIRUS 2 (HOSPITAL ORDER, [place] [facility])  CULTURE, BLOOD (ROUTINE  X 2)  CULTURE, BLOOD (ROUTINE X 2)  URINE CULTURE  MRSA PCR SCREENING  LACTIC ACID, PLASMA  URINALYSIS, ROUTINE W REFLEX MICROSCOPIC  LIPASE, BLOOD  MAGNESIUM  COMPREHENSIVE METABOLIC PANEL  CBC WITH DIFFERENTIAL/PLATELET  PROTIME-INR  STREP PNEUMONIAE URINARY ANTIGEN  SODIUM, URINE, RANDOM  CREATININE, URINE, RANDOM  I-STAT ARTERIAL BLOOD GAS, ED    EKG EKG Interpretation  Date/Time:  Wednesday September 16 2018 17:24:07 EDT Ventricular Rate:  132 PR Interval:    QRS Duration: 82 QT Interval:  280 QTC Calculation: 415 R Axis:   -42 Text Interpretation:  Sinus tachycardia Inferior infarct, old No significant change since last tracing Confirmed by Deno Etienne [id number]) on 09/16/2018 5:53:54 PM   Radiology Ct Abdomen Pelvis W Contrast  Result Date: 09/16/2018 CLINICAL DATA:  Pancreatic cancer with stent in place. Now with sepsis. On antibiotics. Altered mental status. Fever. EXAM: CT ABDOMEN AND PELVIS WITH CONTRAST TECHNIQUE: Multidetector CT imaging of the abdomen and pelvis was performed using the standard protocol following bolus administration of intravenous contrast. CONTRAST:  184m OMNIPAQUE IOHEXOL 300 MG/ML  SOLN COMPARISON:  09/12/2018 right upper quadrant ultrasound. Most recent CT of 08/18/2018. FINDINGS: Lower chest: Increased left and new right base dependent opacity, most consistent with atelectasis. This likely obscures the previously described right lung base pulmonary nodule. There are right lung base nodules on the order of 2 mm which are felt to be similar to on the prior. Small bilateral pleural effusions, increased. Hepatobiliary: Since 08/18/2018, placement of a common duct stent. This originates in the porta  hepatis and terminates in the descending duodenum. There is minimal pneumobilia. Increased intrahepatic duct dilatation, with the right hepatic duct measuring 8 mm on image 19/3 versus 6 mm on 08/18/2018. The common duct measures 15 mm just proximal to the stent  versus 10 mm at this level on 08/18/2018 CT. Pancreas: Pancreatic atrophy and upstream duct dilatation. This continues to the level of a pancreatic head/uncinate process mass which measures on the order of 4.3 x 2.4 cm on image 31/3. Compare 3.5 x 2.1 cm on the prior. Redemonstration of SMA involvement with tumor on image 31/3. No superimposed pancreatitis. Spleen: Normal in size, without focal abnormality. Adrenals/Urinary Tract: Normal adrenal glands. Bilateral too small to characterize renal lesions. Left renal cysts. Left-sided bladder diverticulum. Stomach/Bowel: The stomach is underdistended. Apparent wall thickening, including proximally on image 18/3, is at least partially felt to be secondary. Extensive colonic diverticulosis. Normal terminal ileum. Normal small bowel. Vascular/Lymphatic: Aortic and branch vessel atherosclerosis. SMA involvement with tumor as detailed above. There is also contact with this SMV. 1.4 cm portacaval node is newly enlarged since the prior, including on image 26/3. No pelvic sidewall adenopathy. Reproductive: Mild prostatomegaly. Other: Tiny fat containing left inguinal hernia. Development of small volume abdominopelvic ascites. Musculoskeletal: No acute osseous abnormality. Lumbosacral spondylosis. IMPRESSION: 1. Since 08/18/2018, mild interval enlargement of the pancreatic head/uncinate process mass. 2. Interval placement of a common duct stent. Slight increase in mild to moderate intrahepatic and proximal extrahepatic duct dilatation. Correlate with stent function. 3. Developing portacaval adenopathy, favored to represent metastatic disease. 4. New small volume abdominopelvic ascites. 5. Increase in small bilateral pleural effusions with developing bibasilar atelectasis. Right base pulmonary nodules as detailed previously. 6. Gastric underdistention. Apparent wall thickening is likely secondary. Gastritis cannot be excluded. Electronically Signed   By: Abigail Miyamoto M.D.   On:  09/16/2018 20:47   Dg Chest Port 1 View  Result Date: 09/16/2018 CLINICAL DATA:  85 year old male with shortness of breath and fever EXAM: PORTABLE CHEST 1 VIEW COMPARISON:  09/11/2018 FINDINGS: Cardiomediastinal silhouette unchanged in size and contour. No evidence of central vascular congestion. No pneumothorax. Low lung volumes with coarsened interstitial markings and no new confluent airspace disease. Left basilar opacity with blunting of the costophrenic angle. IMPRESSION: Low lung volumes with left basilar opacity, potentially small pleural fluid and associated atelectasis/consolidation Electronically Signed   By: Corrie Mckusick D.O.   On: 09/16/2018 18:28    Procedures .Critical Care Performed by: Kinnie Feil, PA-C Authorized by: Kinnie Feil, PA-C   Critical care provider statement:    Critical care time (minutes):  45   Critical care was necessary to treat or prevent imminent or life-threatening deterioration of the following conditions:  Sepsis   Critical care was time spent personally by me on the following activities:  Discussions with consultants, evaluation of patient's response to treatment, examination of patient, ordering and performing treatments and interventions, ordering and review of laboratory studies, ordering and review of radiographic studies, pulse oximetry, re-evaluation of patient's condition, obtaining history from patient or surrogate and review of old charts   I assumed direction of critical care for this patient from another provider in my specialty: no     (including critical care time)  Medications Ordered in ED Medications  meropenem (MERREM) 1 g in sodium chloride 0.9 % 100 mL IVPB (has no administration in time range)  vancomycin (VANCOCIN) 1,250 mg in sodium chloride 0.9 % 250 mL IVPB (has no administration in time range)  pantoprazole (PROTONIX) EC tablet 40 mg (has no administration in time range)  ferrous sulfate tablet 325 mg (has no  administration in time range)  pramipexole (MIRAPEX) tablet 0.125 mg (0.125 mg Oral Given 09/17/18 0000)  feeding supplement (ENSURE ENLIVE) (ENSURE ENLIVE) liquid 237 mL (has no administration in time range)  multivitamin with minerals tablet 1 tablet (has no administration in time range)  acetaminophen (TYLENOL) tablet 650 mg (has no administration in time range)    Or  acetaminophen (TYLENOL) suppository 650 mg (has no administration in time range)  0.9 %  sodium chloride infusion ( Intravenous New Bag/Given 09/16/18 2351)  vancomycin (VANCOCIN) IVPB 1000 mg/200 mL premix (0 mg Intravenous Stopped 09/16/18 1920)  acetaminophen (TYLENOL) tablet 650 mg (650 mg Oral Given 09/16/18 1807)  sodium chloride 0.9 % bolus 1,000 mL (0 mLs Intravenous Stopped 09/16/18 1853)  meropenem (MERREM) 2 g in sodium chloride 0.9 % 100 mL IVPB (0 g Intravenous Stopped 09/16/18 2045)  iohexol (OMNIPAQUE) 300 MG/ML solution 100 mL (100 mLs Intravenous Contrast Given 09/16/18 1941)  sodium chloride 0.9 % bolus 1,000 mL (1,000 mLs Intravenous New Bag/Given 09/16/18 2106)     Initial Impression / Assessment and Plan / ED Course  I have reviewed the triage vital signs and the nursing notes.  Pertinent labs & imaging results that were available during my care of the patient were reviewed by me and considered in my medical decision making (see chart for details).  Clinical Course as of Sep 17 10  Wed Sep 16, 2018  1735 Febrile, confused, tachy HR 130s, tachypnic RR 44. Sepsis code activated. Discussed with ED MD.    [CG]  1742 Last creatinine and echo reviewed, both normal. Will give 1 L IVF for now. Pending lactate. No hypotension.    [CG]  1845 IMPRESSION: Low lung volumes with left basilar opacity, potentially small pleural fluid and associated atelectasis/consolidation  DG Chest Port 1 View [CG]  1923 Total Bilirubin(!): 3.3 [CG]  1923 Alkaline Phosphatase(!): 555 [CG]  1923 AST(!): 108 [CG]  1923 ALT(!): 62  [CG]  1923 Glucose(!): 235 [CG]  1924 Lactic Acid, Venous(!!): 3.2 [CG]    Clinical Course User Index [CG] Kinnie Feil, PA-C      SIRS criteria met with tachynea, fever, tachycardia. Multiple possibilities for source including CBD stent vs bacterial/COVID PNA vs UTI. No skin wounds that appear infected. No report of headache, no meningismus.   1800: last blood cultures grew enterobacter and klebsiella.  Spoke to pharmacy who agrees vancomycin and meropenem reasonable. Tylenol.  No hypotension. No signs of significant fluid overload. Unknown creatinine or lactate. Mild moderate distress with tachypnea but this may be from high fever. Will start with 1 L IVF. 2 L [place] for comfort. Discussed with EDMD.   Final Clinical Impressions(s) / ED Diagnoses   CXR with LLL opacity and effusion.  COVID negative. Lactic 3.2.  No leukocytosis. HD stable. Will give toradol for fever that may be causing tachycardia/tachypnea.  UA negative. Pending CTAP for evaluation of CMD stent. His LFTs and total bilirubin not significantly changes from baseline. He has no abd tenderness. Will consult hospitalist for admission for SIRS with likely source LLL HAP.  Final diagnoses:  HAP (hospital-acquired pneumonia)    ED Discharge Orders    None       Kinnie Feil, PA-C 09/17/18 0012    Deno Etienne, DO 09/17/18 1507

## 2018-09-16 NOTE — ED Triage Notes (Signed)
Pt arrives via POV with daughter in law after being DC'd Monday from [place] with Sepsis.  At the time, he had SOB, fever orally of 105, HR 120s ST, and weakness. Arrives today with recurrent symptoms of elevated HR 120s ST, temp 101 orally, SOB, and weakness. Hx Panc Ca with bili stent Pt unable to sit up on arrival, very weak and SOB.

## 2018-09-16 NOTE — Progress Notes (Signed)
Pharmacy Antibiotic Note  Jose [name] is a 85 y.o. male admitted on 09/16/2018 with sepsis. Pharmacy has been consulted for vancomycin and meropenem dosing. Pt is febrile with Tmax of 102.9 and WBC is WNL. SCr is slightly above patients baseline and lactic acid is elevated. He was recently discharged on cipro and flagyl for a klebsiella bacteremia. He also has a history of ESBL Ecoli bacteremia  Plan: Vancomycin 1gm IV x 1 then 1250mg  IV Q48H Meropenem 2gm IV x 1 then 1gm IV Q12H F/u renal fxn, C&S, clinical status and peak/trough at SS  Height: 5\' 10"  (177.8 cm) Weight: 123 lb 7.3 oz (56 kg) IBW/kg (Calculated) : 73  Temp (24hrs), Avg:101 F (38.3 C), Min:101 F (38.3 C), Max:101 F (38.3 C)  Recent Labs  Lab 09/11/18 2210 09/11/18 2303 09/11/18 2304 09/12/18 0102 09/12/18 0453 09/12/18 0808 09/13/18 0124 09/14/18 0446  WBC 3.9*  --   --   --  5.4  --  6.0 4.3  CREATININE  --   --  0.78  --  0.71  --  0.70 0.66  LATICACIDVEN  --  3.1*  --  3.2* 2.1* 1.9  --   --     Estimated Creatinine Clearance: 46.7 mL/min (by C-G formula based on SCr of 0.66 mg/dL).    No Known Allergies  Antimicrobials this admission: Vanc 4/29>> Mero 4/29>>  Dose adjustments this admission: N/A  Microbiology results: Pending  Thank you for allowing pharmacy to be a part of this patient's care.  Kemi Gell, Rande Lawman 09/16/2018 5:53 PM

## 2018-09-16 NOTE — Telephone Encounter (Signed)
Recent gall bladder stent placed. Temp 104.5 oral, pulse 123 bpm at rest. Appears more lethargic today than usual.   Advised [place] at this time. Therapist reports he has had a negative covid-19 test. Patient's daughter is on the way and will take him to the hospital or call 911. No travels No known exposures.  Called report to [facility] ED charge RN,Brooke. Routing encounter to PCP.  Reason for Disposition . Difficult to awaken or acting confused (e.g., disoriented, slurred speech)  Answer Assessment - Initial Assessment Questions 1. TEMPERATURE: "What is the most recent temperature?"  "How was it measured?"      104.5 oral with pulse 120's. 2. ONSET: "When did the fever start?"      today 3. SYMPTOMS: "Do you have any other symptoms besides the fever?"  (e.g., colds, headache, sore throat, earache, cough, rash, diarrhea, vomiting, abdominal pain)     *No Answer* 4. CAUSE: If there are no symptoms, ask: "What do you think is causing the fever?"      *No Answer* 5. CONTACTS: "Does anyone else in the family have an infection?"     *No Answer* 6. TREATMENT: "What have you done so far to treat this fever?" (e.g., medications)     *No Answer* 7. IMMUNOCOMPROMISE: "Do you have of the following: diabetes, HIV positive, splenectomy, cancer chemotherapy, chronic steroid treatment, transplant patient, etc."     *No Answer* 8. PREGNANCY: "Is there any chance you are pregnant?" "When was your last menstrual period?"     *No Answer* 9. TRAVEL: "Have you traveled out of the country in the last month?" (e.g., travel history, exposures)     *No Answer*  Protocols used: FEVER-A-AH

## 2018-09-17 ENCOUNTER — Encounter (HOSPITAL_COMMUNITY): Payer: Self-pay

## 2018-09-17 DIAGNOSIS — D638 Anemia in other chronic diseases classified elsewhere: Secondary | ICD-10-CM

## 2018-09-17 DIAGNOSIS — D696 Thrombocytopenia, unspecified: Secondary | ICD-10-CM

## 2018-09-17 DIAGNOSIS — C257 Malignant neoplasm of other parts of pancreas: Secondary | ICD-10-CM

## 2018-09-17 LAB — URINE CULTURE: Culture: NO GROWTH

## 2018-09-17 LAB — CBC WITH DIFFERENTIAL/PLATELET
Abs Immature Granulocytes: 0.05 10*3/uL (ref 0.00–0.07)
Basophils Absolute: 0 10*3/uL (ref 0.0–0.1)
Basophils Relative: 1 %
Eosinophils Absolute: 0.1 10*3/uL (ref 0.0–0.5)
Eosinophils Relative: 2 %
HCT: 26 % — ABNORMAL LOW (ref 39.0–52.0)
Hemoglobin: 8.8 g/dL — ABNORMAL LOW (ref 13.0–17.0)
Immature Granulocytes: 1 %
Lymphocytes Relative: 15 %
Lymphs Abs: 0.6 10*3/uL — ABNORMAL LOW (ref 0.7–4.0)
MCH: 33.8 pg (ref 26.0–34.0)
MCHC: 33.8 g/dL (ref 30.0–36.0)
MCV: 100 fL (ref 80.0–100.0)
Monocytes Absolute: 0.4 10*3/uL (ref 0.1–1.0)
Monocytes Relative: 9 %
Neutro Abs: 3 10*3/uL (ref 1.7–7.7)
Neutrophils Relative %: 72 %
Platelets: 63 10*3/uL — ABNORMAL LOW (ref 150–400)
RBC: 2.6 MIL/uL — ABNORMAL LOW (ref 4.22–5.81)
RDW: 20.9 % — ABNORMAL HIGH (ref 11.5–15.5)
WBC: 4.2 10*3/uL (ref 4.0–10.5)
nRBC: 0 % (ref 0.0–0.2)

## 2018-09-17 LAB — COMPREHENSIVE METABOLIC PANEL
ALT: 43 U/L (ref 0–44)
AST: 57 U/L — ABNORMAL HIGH (ref 15–41)
Albumin: 1.9 g/dL — ABNORMAL LOW (ref 3.5–5.0)
Alkaline Phosphatase: 380 U/L — ABNORMAL HIGH (ref 38–126)
Anion gap: 8 (ref 5–15)
BUN: 6 mg/dL — ABNORMAL LOW (ref 8–23)
CO2: 20 mmol/L — ABNORMAL LOW (ref 22–32)
Calcium: 7.5 mg/dL — ABNORMAL LOW (ref 8.9–10.3)
Chloride: 110 mmol/L (ref 98–111)
Creatinine, Ser: 0.93 mg/dL (ref 0.61–1.24)
GFR calc Af Amer: >60 mL/min (ref >60)
GFR calc non Af Amer: >60 mL/min (ref >60)
Glucose, Bld: 138 mg/dL — ABNORMAL HIGH (ref 70–99)
Potassium: 3.3 mmol/L — ABNORMAL LOW (ref 3.5–5.1)
Sodium: 138 mmol/L (ref 135–145)
Total Bilirubin: 2.5 mg/dL — ABNORMAL HIGH (ref 0.3–1.2)
Total Protein: 4 g/dL — ABNORMAL LOW (ref 6.5–8.1)

## 2018-09-17 LAB — SODIUM, URINE, RANDOM: Sodium, Ur: 159 mmol/L

## 2018-09-17 LAB — PROTIME-INR
INR: 1.6 — ABNORMAL HIGH (ref 0.8–1.2)
Prothrombin Time: 18.4 seconds — ABNORMAL HIGH (ref 11.4–15.2)

## 2018-09-17 LAB — MAGNESIUM: Magnesium: 1.5 mg/dL — ABNORMAL LOW (ref 1.7–2.4)

## 2018-09-17 LAB — STREP PNEUMONIAE URINARY ANTIGEN: Strep Pneumo Urinary Antigen: NEGATIVE

## 2018-09-17 LAB — CREATININE, URINE, RANDOM: Creatinine, Urine: 69.93 mg/dL

## 2018-09-17 LAB — MRSA PCR SCREENING: MRSA by PCR: NEGATIVE

## 2018-09-17 MED ORDER — POTASSIUM CHLORIDE CRYS ER 20 MEQ PO TBCR
40.0000 meq | EXTENDED_RELEASE_TABLET | Freq: Once | ORAL | Status: AC
  Administered 2018-09-17: 40 meq via ORAL
  Filled 2018-09-17: qty 2

## 2018-09-17 MED ORDER — METOPROLOL TARTRATE 12.5 MG HALF TABLET
12.5000 mg | ORAL_TABLET | Freq: Two times a day (BID) | ORAL | Status: DC
  Administered 2018-09-17 – 2018-09-19 (×4): 12.5 mg via ORAL
  Filled 2018-09-17 (×4): qty 1

## 2018-09-17 MED ORDER — SODIUM CHLORIDE 0.9 % IV SOLN: INTRAVENOUS | Status: DC | PRN

## 2018-09-17 MED ORDER — MAGNESIUM SULFATE 2 GM/50ML IV SOLN
2.0000 g | Freq: Once | INTRAVENOUS | Status: AC
  Administered 2018-09-17: 2 g via INTRAVENOUS
  Filled 2018-09-17: qty 50

## 2018-09-17 NOTE — Progress Notes (Signed)
Patient has had multiple loose stools today in the commode reports this is normal for him. His bottom slightly red.

## 2018-09-17 NOTE — Consult Note (Signed)
[facility] Consultation Note  Referring Provider: Dr. Florencia Reasons [facility]) Primary Care Physician:  [name], MD Primary Gastroenterologist:  Dr. Clarene Essex  Reason for Consultation:  Fevers, elevated liver enzymes  HPI: Jose [name] is a 85 y.o. male admitted with fevers.  Recent hospitalization for pneumonia, came back to hospital after < 48 hours.  Has CT showing progression of pancreatic disease; lessening pneumobilia.  No abdominal pain.     Past Medical History:  Diagnosis Date  . Anxiety   . Back problem 1960  . BPH (benign prostatic hypertrophy)   . Cervical radiculopathy    with neuropathy  . Dyspnea   . GERD (gastroesophageal reflux disease)   . HOH (hard of hearing)   . Hypertension   . Hypokalemia   . Melanoma in situ ([place]) 12/2016   neck  . Pancreatic adenocarcinoma [facility])    per patient D-I-L [name] , dx after last ercp, first oncology appt on 08-26-2018 at [facility] cancer center    Past Surgical History:  Procedure Laterality Date  . BILIARY STENT PLACEMENT  03/30/2018   Procedure: BILIARY STENT PLACEMENT;  Surgeon: Clarene Essex, MD;  Location: [facility] ENDOSCOPY;  Service: Endoscopy;;  . BILIARY STENT PLACEMENT N/A 08/27/2018   Procedure: BILIARY STENT PLACEMENT;  Surgeon: Clarene Essex, MD;  Location: [facility] ENDOSCOPY;  Service: Endoscopy;  Laterality: N/A;  . CATARACT EXTRACTION     OD  . CHOLECYSTECTOMY N/A 01/26/2018   Procedure: LAPAROSCOPIC CHOLECYSTECTOMY;  Surgeon: Coralie Keens, MD;  Location: [place];  Service: General;  Laterality: N/A;  . ENDOSCOPIC RETROGRADE CHOLANGIOPANCREATOGRAPHY (ERCP) WITH PROPOFOL N/A 03/13/2018   Procedure: ENDOSCOPIC RETROGRADE CHOLANGIOPANCREATOGRAPHY (ERCP) WITH PROPOFOL;  Surgeon: Lucilla Lame, MD;  Location: [facility] ENDOSCOPY;  Service: Endoscopy;  Laterality: N/A;  . ERCP N/A 03/29/2018   Procedure: ENDOSCOPIC RETROGRADE CHOLANGIOPANCREATOGRAPHY (ERCP);  Surgeon: Ronnette Juniper, MD;  Location: [place];  Service:  Gastroenterology;  Laterality: N/A;  . ERCP N/A 03/30/2018   Procedure: ENDOSCOPIC RETROGRADE CHOLANGIOPANCREATOGRAPHY (ERCP);  Surgeon: Clarene Essex, MD;  Location: [place];  Service: Endoscopy;  Laterality: N/A;  . ERCP N/A 08/10/2018   Procedure: ENDOSCOPIC RETROGRADE CHOLANGIOPANCREATOGRAPHY (ERCP);  Surgeon: Clarene Essex, MD;  Location: [facility];  Service: Endoscopy;  Laterality: N/A;  Balloon sweep for stones  . ERCP N/A 08/27/2018   Procedure: ENDOSCOPIC RETROGRADE CHOLANGIOPANCREATOGRAPHY (ERCP) with STENT PLACEMENT;  Surgeon: Clarene Essex, MD;  Location: [facility] ENDOSCOPY;  Service: Endoscopy;  Laterality: N/A;  . IR PERC CHOLECYSTOSTOMY  12/28/2017  . LAPAROSCOPIC CHOLECYSTECTOMY  01/26/2018  . MELANOMA EXCISION  12/2016   in situ---Dr Dasher  . SPHINCTEROTOMY  03/30/2018   Procedure: SPHINCTEROTOMY;  Surgeon: Clarene Essex, MD;  Location: [facility] ENDOSCOPY;  Service: Endoscopy;;  . STENT REMOVAL  03/29/2018   Procedure: STENT REMOVAL;  Surgeon: Ronnette Juniper, MD;  Location[facility]COPY;  Service: Gastroenterology;;  . Lavell Islam REMOVAL  08/10/2018   Procedure: STENT REMOVAL;  Surgeon: Clarene Essex, MD;  Location: [facility] ENDOSCOPY;  Service: Endoscopy;;  . TONSILLECTOMY      Prior to Admission medications   Medication Sig Start Date End Date Taking? Authorizing Provider  acetaminophen (TYLENOL) 325 MG tablet Take 650 mg by mouth every 6 (six) hours as needed for mild pain.    Yes [provider]  ciprofloxacin (CIPRO) 500 MG tablet Take 1 tablet (500 mg total) by mouth 2 (two) times daily for 10 days. 09/14/18 09/24/18 Yes Vaughan Basta, MD  feeding supplement, ENSURE ENLIVE, (ENSURE ENLIVE) LIQD Take 6 mLs by mouth  2 (two) times daily between meals. Patient taking differently: Take 237 mLs by mouth daily.  03/17/18  Yes Wieting, Richard, MD  ferrous sulfate 325 (65 FE) MG EC tablet Take 325 mg by mouth daily with breakfast.   Yes [provider]  gabapentin (NEURONTIN) 600 MG  tablet Take 1 tablet (600 mg total) by mouth every morning. And 2 at bedtime Patient taking differently: Take 600 mg by mouth 2 (two) times daily.  06/04/18  Yes Viviana Simpler I, MD  metoprolol succinate (TOPROL-XL) 25 MG 24 hr tablet TAKE ONE TABLET BY MOUTH DAILY Patient taking differently: Take 25 mg by mouth daily.  05/28/18  Yes [name], MD  metroNIDAZOLE (FLAGYL) 500 MG tablet Take 1 tablet (500 mg total) by mouth 3 (three) times daily for 10 days. 09/14/18 09/24/18 Yes Vaughan Basta, MD  Multiple Vitamin (MULTIVITAMIN WITH MINERALS) TABS tablet Take 1 tablet by mouth daily. 03/17/18  Yes Wieting, Richard, MD  omeprazole (PRILOSEC) 20 MG capsule TAKE ONE CAPSULE BY MOUTH DAILY 09/15/18  Yes [name], MD  pramipexole (MIRAPEX) 0.125 MG tablet Take 1 tablet (0.125 mg total) by mouth at bedtime as needed. Patient taking differently: Take 0.125 mg by mouth at bedtime as needed (restless legs).  06/18/18  Yes [name], MD  Probiotic Product (ALIGN) 4 MG CAPS Take 4 mg by mouth daily.   Yes [provider]  tamsulosin (FLOMAX) 0.4 MG CAPS capsule Take 1 capsule (0.4 mg total) by mouth daily. 06/26/18  Yes [name], MD  traMADol (ULTRAM) 50 MG tablet Take 1 tablet (50 mg total) by mouth every 12 (twelve) hours as needed for moderate pain. 09/02/18  Yes Owens Shark, NP  vitamin B-12 (CYANOCOBALAMIN) 500 MCG tablet Take 500 mcg by mouth daily.   Yes [provider]    Current Facility-Administered Medications  Medication Dose Route Frequency Provider Last Rate Last Dose  . 0.9 %  sodium chloride infusion   Intravenous Continuous Howerter, Justin B, DO 100 mL/hr at 09/17/18 1345    . 0.9 %  sodium chloride infusion   Intravenous PRN Florencia Reasons, MD      . acetaminophen (TYLENOL) tablet 650 mg  650 mg Oral Q6H PRN Howerter, Justin B, DO       Or  . acetaminophen (TYLENOL) suppository 650 mg  650 mg Rectal Q6H PRN Howerter, Justin B, DO      .  feeding supplement (ENSURE ENLIVE) (ENSURE ENLIVE) liquid 237 mL  237 mL Oral BID BM Howerter, Justin B, DO   237 mL at 09/17/18 1041  . ferrous sulfate tablet 325 mg  325 mg Oral Q breakfast Howerter, Justin B, DO   325 mg at 09/17/18 0906  . meropenem (MERREM) 1 g in sodium chloride 0.9 % 100 mL IVPB  1 g Intravenous Q12H Howerter, Justin B, DO   Stopped at 09/17/18 1131  . multivitamin with minerals tablet 1 tablet  1 tablet Oral Daily Howerter, Justin B, DO   1 tablet at 09/17/18 0907  . pantoprazole (PROTONIX) EC tablet 40 mg  40 mg Oral Daily Howerter, Justin B, DO   40 mg at 09/17/18 0907  . pramipexole (MIRAPEX) tablet 0.125 mg  0.125 mg Oral QHS PRN Howerter, Justin B, DO   0.125 mg at 09/17/18 0000    Allergies as of 09/16/2018  . (No Known Allergies)    Family History  Problem Relation Age of Onset  . Cancer Son   .  Coronary artery disease Neg Hx   . Diabetes Neg Hx     Social History   Socioeconomic History  . Marital status: Widowed    Spouse name: Not on file  . Number of children: 2  . Years of education: Not on file  . Highest education level: Not on file  Occupational History  . Occupation: retiredOccupational psychologist [employer]    Employer: RETIRED  Social Needs  . Financial resource strain: Not on file  . Food insecurity:    Worry: Not on file    Inability: Not on file  . Transportation needs:    Medical: Not on file    Non-medical: Not on file  Tobacco Use  . Smoking status: Never Smoker  . Smokeless tobacco: Never Used  Substance and Sexual Activity  . Alcohol use: No  . Drug use: No  . Sexual activity: Not on file  Lifestyle  . Physical activity:    Days per week: Not on file    Minutes per session: Not on file  . Stress: Not on file  Relationships  . Social connections:    Talks on phone: Patient refused    Gets together: Patient refused    Attends religious service: Patient refused    Active member of club or organization: Patient refused    Attends  meetings of clubs or organizations: Patient refused    Relationship status: Patient refused  . Intimate partner violence:    Fear of current or ex partner: Patient refused    Emotionally abused: Patient refused    Physically abused: Patient refused    Forced sexual activity: Patient refused  Other Topics Concern  . Not on file  Social History Narrative   Has living will   Son Mikeal Hawthorne is health care POA.     Requests DNR--done 05/07/16   No feeding tube if cognitively unaware    Review of Systems: As per HPI, all others negative  Physical Exam: Vital signs in last 24 hours: Temp:  [97.4 F (36.3 C)-102.9 F (39.4 C)] 98.5 F (36.9 C) (04/30 1200) Pulse Rate:  [73-130] 81 (04/30 1200) Resp:  [18-44] 20 (04/30 1200) BP: (92-148)/(47-97) 134/66 (04/30 1200) SpO2:  [95 %-100 %] 98 % (04/30 1200) Weight:  [56 kg-59 kg] 59 kg (04/30 0347) Last BM Date: 09/17/18 General:   Alert,  Well-developed, somewhat cachectic-appearing, pleasant and cooperative in NAD Head:  Bitemporal atrophy, otherwise Normocephalic and atraumatic. Eyes:  Sclera slight icteric bilaterally..   Conjunctiva pink. Ears:  Significant presbyacusis Nose:  No deformity, discharge,  or lesions. Mouth:  Poor dentition Neck:  Supple; no masses or thyromegaly. Lungs:  Clear throughout to auscultation.   No wheezes, crackles, or rhonchi. No acute distress. Heart:  Regular rate and rhythm; no murmurs, clicks, rubs,  or gallops. Abdomen:  Soft, nontender and nondistended. No masses, hepatosplenomegaly or hernias noted. Normal bowel sounds, without guarding, and without rebound.     Msk:  Muscular atrophy symmetrical without gross deformities. Normal posture. Pulses:  Normal pulses noted. Extremities: 1-2 + edema bilateral lower extremities Neurologic:  Alert and  oriented x4;  grossly normal neurologically. Skin:  Intact without significant lesions or rashes. Psych:  Alert and cooperative. Normal mood and  affect.   Lab Results: Recent Labs    09/16/18 1737 09/16/18 1815 09/17/18 0400  WBC 4.1  --  4.2  HGB 10.9* 10.2* 8.8*  HCT 32.5* 30.0* 26.0*  PLT 69*  --  63*   BMET Recent Labs  09/16/18 1737 09/16/18 1815 09/17/18 0400  NA 135 137 138  K 3.5 3.3* 3.3*  CL 103  --  110  CO2 21*  --  20*  GLUCOSE 235*  --  138*  BUN 6*  --  6*  CREATININE 1.15  --  0.93  CALCIUM 8.1*  --  7.5*   LFT Recent Labs    09/17/18 0400  PROT 4.0*  ALBUMIN 1.9*  AST 57*  ALT 43  ALKPHOS 380*  BILITOT 2.5*   PT/INR Recent Labs    09/17/18 0400  LABPROT 18.4*  INR 1.6*    Studies/Results: Ct Abdomen Pelvis W Contrast  Result Date: 09/16/2018 CLINICAL DATA:  Pancreatic cancer with stent in place. Now with sepsis. On antibiotics. Altered mental status. Fever. EXAM: CT ABDOMEN AND PELVIS WITH CONTRAST TECHNIQUE: Multidetector CT imaging of the abdomen and pelvis was performed using the standard protocol following bolus administration of intravenous contrast. CONTRAST:  176mL OMNIPAQUE IOHEXOL 300 MG/ML  SOLN COMPARISON:  09/12/2018 right upper quadrant ultrasound. Most recent CT of 08/18/2018. FINDINGS: Lower chest: Increased left and new right base dependent opacity, most consistent with atelectasis. This likely obscures the previously described right lung base pulmonary nodule. There are right lung base nodules on the order of 2 mm which are felt to be similar to on the prior. Small bilateral pleural effusions, increased. Hepatobiliary: Since 08/18/2018, placement of a common duct stent. This originates in the porta hepatis and terminates in the descending duodenum. There is minimal pneumobilia. Increased intrahepatic duct dilatation, with the right hepatic duct measuring 8 mm on image 19/3 versus 6 mm on 08/18/2018. The common duct measures 15 mm just proximal to the stent versus 10 mm at this level on 08/18/2018 CT. Pancreas: Pancreatic atrophy and upstream duct dilatation. This continues  to the level of a pancreatic head/uncinate process mass which measures on the order of 4.3 x 2.4 cm on image 31/3. Compare 3.5 x 2.1 cm on the prior. Redemonstration of SMA involvement with tumor on image 31/3. No superimposed pancreatitis. Spleen: Normal in size, without focal abnormality. Adrenals/Urinary Tract: Normal adrenal glands. Bilateral too small to characterize renal lesions. Left renal cysts. Left-sided bladder diverticulum. Stomach/Bowel: The stomach is underdistended. Apparent wall thickening, including proximally on image 18/3, is at least partially felt to be secondary. Extensive colonic diverticulosis. Normal terminal ileum. Normal small bowel. Vascular/Lymphatic: Aortic and branch vessel atherosclerosis. SMA involvement with tumor as detailed above. There is also contact with this SMV. 1.4 cm portacaval node is newly enlarged since the prior, including on image 26/3. No pelvic sidewall adenopathy. Reproductive: Mild prostatomegaly. Other: Tiny fat containing left inguinal hernia. Development of small volume abdominopelvic ascites. Musculoskeletal: No acute osseous abnormality. Lumbosacral spondylosis. IMPRESSION: 1. Since 08/18/2018, mild interval enlargement of the pancreatic head/uncinate process mass. 2. Interval placement of a common duct stent. Slight increase in mild to moderate intrahepatic and proximal extrahepatic duct dilatation. Correlate with stent function. 3. Developing portacaval adenopathy, favored to represent metastatic disease. 4. New small volume abdominopelvic ascites. 5. Increase in small bilateral pleural effusions with developing bibasilar atelectasis. Right base pulmonary nodules as detailed previously. 6. Gastric underdistention. Apparent wall thickening is likely secondary. Gastritis cannot be excluded. Electronically Signed   By: Abigail Miyamoto M.D.   On: 09/16/2018 20:47   Dg Chest Port 1 View  Result Date: 09/16/2018 CLINICAL DATA:  85 year old male with shortness of  breath and fever EXAM: PORTABLE CHEST 1 VIEW COMPARISON:  09/11/2018 FINDINGS: Cardiomediastinal silhouette unchanged  in size and contour. No evidence of central vascular congestion. No pneumothorax. Low lung volumes with coarsened interstitial markings and no new confluent airspace disease. Left basilar opacity with blunting of the costophrenic angle. IMPRESSION: Low lung volumes with left basilar opacity, potentially small pleural fluid and associated atelectasis/consolidation Electronically Signed   By: Corrie Mckusick D.O.   On: 09/16/2018 18:28    Impression:  1.  Fevers with polymicrobial bacteremia with organisms most typical of GI tract. 2.  Elevated LFTs, cyclical, slightly improving.  Suspect from biliary obstruction; multiple ERCPs done, last done about 3 weeks ago. 3.  Suspected pancreatic cancer, advancing. 4.  Pneumonia; on room air right now without significant respiratory distress. 5.  Imaging study with decreasing degree of pneumobilia, suspicious for partial stent occlusion (debris versus tumor).  Plan:  1.  Supportive care with IVF and antibiotics. 2.  I have discussed case with Dr. Watt Climes.  We would like to proceed with repeat ERCP for possible bile duct stent debridement +/- placement of another stent (covered this time?).  Unfortunately, there is no room on the endoscopy schedule for tomorrow.  So, since patient looks clinically ok and is not decompensated, will have to wait until the weekend. 3.  OK to have diet (clear liquids today, maybe soft diet tomorrow). 4.  [facility] will follow.   LOS: 1 day   Orine Goga M  09/17/2018, 2:54 PM  Cell [phone] If no answer or after 5 PM call [phone]

## 2018-09-17 NOTE — Evaluation (Signed)
Physical Therapy Evaluation Patient Details Name: Jose [name] MRN: [id number] DOB: [birth year] Today's Date: 09/17/2018   History of Present Illness  85 yo admitted with fever and severe sepsis from [facility] PNA. PMHx: HTN, dyspnea, HOH, pancreatic CA with bile duct obstruction s/p biliary stent.  Clinical Impression  Pt admitted <48/hrs after discharge from [facility]. PTA lives alone in single story home with ramped entrance. 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 Family assist for iADLs. Pt limited in safe mobility by decreased awareness of deficits and safety, as well as generalized weakness and instability. Pt is mod I for bed mobility, min A for sit>stand from lower toilet surface and min guard for ambulation with RW. PT recommends 24 hour supervision and HHPT to return to PLOF. PT will continue to follow acutely.     Follow Up Recommendations Supervision/Assistance - 24 hour;Supervision for mobility/OOB;Home health PT    Equipment Recommendations  None recommended by PT    Recommendations for Other Services OT consult     Precautions / Restrictions Precautions Precautions: Fall Precaution Comments: impulsive with movement Restrictions Weight Bearing Restrictions: No      Mobility  Bed Mobility Overal bed mobility: Modified Independent             General bed mobility comments: use of bedrail to pull to EoB  Transfers Overall transfer level: Needs assistance Equipment used: Rolling walker (2 wheeled) Transfers: Sit to/from Stand Sit to Stand: Min assist;Min guard         General transfer comment: min guard for safety with power up from bed and mod A for powerup from lower toilet surface   Ambulation/Gait Ambulation/Gait assistance: Min guard Gait Distance (Feet): 20 Feet Assistive device: Rolling walker (2 wheeled) Gait Pattern/deviations: Step-through  pattern;Decreased step length - right;Decreased step length - left;Trunk flexed;Shuffle Gait velocity: slowed Gait velocity interpretation: <1.31 ft/sec, indicative of household ambulator General Gait Details: hands on min guard for safety, mild instability with no over LoB        Balance Overall balance assessment: History of Falls;Needs assistance Sitting-balance support: No upper extremity supported;Feet supported Sitting balance-Leahy Scale: Fair     Standing balance support: Single extremity supported;During functional activity Standing balance-Leahy Scale: Poor                               Pertinent Vitals/Pain Pain Assessment: No/denies pain    Home Living Family/patient expects to be discharged to:: Private residence Living Arrangements: Alone Available Help at Discharge: Family(DIL on one side and son on other side) Type of Home: House Home Access: Ramped entrance     Home Layout: One level Home Equipment: Environmental consultant - 2 wheels      Prior Function Level of Independence: Needs assistance   Gait / Transfers Assistance Needed: ambulates with Rw  ADL's / Homemaking Assistance Needed: independent with ADLs, family provides assist for iADLs        Hand Dominance   Dominant Hand: Right    Extremity/Trunk Assessment   Upper Extremity Assessment Upper Extremity Assessment: Overall WFL for tasks assessed    Lower Extremity Assessment Lower Extremity Assessment: Generalized weakness;Overall WFL for tasks assessed       Communication   Communication: HOH  Cognition Arousal/Alertness: Awake/alert Behavior During Therapy: Impulsive Overall Cognitive Status: Difficult to assess  General Comments: decreased awareness of deficits      General Comments General comments (skin integrity, edema, etc.): Pt extremely cold, shivering during session and refusing to sit in recliner due to being cold. Found in  bed with no fewer than 10 blankets on, suggested K-pad to nurse        Assessment/Plan    PT Assessment Patient needs continued PT services  PT Problem List Decreased activity tolerance;Decreased balance;Decreased mobility;Decreased cognition;Decreased coordination;Decreased knowledge of use of DME;Decreased safety awareness;Decreased knowledge of precautions       PT Treatment Interventions DME instruction;Therapeutic exercise;Stair training;Functional mobility training;Therapeutic activities;Balance training;Patient/family education;Cognitive remediation    PT Goals (Current goals can be found in the Care Plan section)  Acute Rehab PT Goals Patient Stated Goal: go home and get warm PT Goal Formulation: With patient Time For Goal Achievement: 10/01/18 Potential to Achieve Goals: Good    Frequency Min 3X/week    AM-PAC PT "6 Clicks" Mobility  Outcome Measure Help needed turning from your back to your side while in a flat bed without using bedrails?: A Little Help needed moving from lying on your back to sitting on the side of a flat bed without using bedrails?: A Little Help needed moving to and from a bed to a chair (including a wheelchair)?: A Little Help needed standing up from a chair using your arms (e.g., wheelchair or bedside chair)?: A Little Help needed to walk in hospital room?: A Little Help needed climbing 3-5 steps with a railing? : A Lot 6 Click Score: 17    End of Session Equipment Utilized During Treatment: Gait belt Activity Tolerance: Patient limited by fatigue Patient left: in bed;with bed alarm set Nurse Communication: Mobility status PT Visit Diagnosis: Unsteadiness on feet (R26.81);Other abnormalities of gait and mobility (R26.89);Difficulty in walking, not elsewhere classified (R26.2)    Time: 9211-9417 PT Time Calculation (min) (ACUTE ONLY): 28 min   Charges:   PT Evaluation $PT Eval Moderate Complexity: 1 Mod PT Treatments $Gait Training: 8-22  mins        Dawit Tankard B. Migdalia Dk PT, DPT Acute Rehabilitation Services Pager [phone] Office [phone]   [place] 09/17/2018, 5:12 PM

## 2018-09-17 NOTE — Telephone Encounter (Signed)
I will follow along with his status in hospital Very frustrating that he went right back

## 2018-09-17 NOTE — Telephone Encounter (Signed)
Per chart review tab pt was admitted to [facility]. 

## 2018-09-17 NOTE — Progress Notes (Addendum)
PROGRESS NOTE  Jose [name] IZT:[id number] DOB: 07-05-1923 DOA: 09/16/2018 PCP: [name], MD  Brief summary:  Jose [name] is a 85 y.o. male with medical history significant for pancreatic adenocarcinoma complicated by common bile duct obstruction status post biliary stent on 08/27/2018.  The patient was recently hospitalized at [facility] from 09/10/2020 to 09/14/2018 for sepsis after presenting with objective fever.  Per review of discharge summary, source of underlying infection was undetermined, however blood cultures were positive for Enterobacter and Klebsiella for which the patient was initially started on IV vancomycin and meropenem before reported discontinuation of IV vancomycin on 09/12/2018.  He was found to be COVID-19 negative, influenza a and B negative, urinalysis was not suggestive of urinary tract infection, and chest x-ray reportedly demonstrated no evidence of underlying pneumonia.  the patient was continued on meropenem for 48 hours following resolution of his objective fever, before being transitioned to ciprofloxacin and Flagyl at the time of discharge, with instructions to complete 10 additional days of these oral antibiotics  He was discharged home on 4/27 with plan to follow up [facility], he returned to [facility] and admitted due to fever on 4/29  HPI/Recap of past 24 hours:  Fever resolved, no cough, no hypoxia at rest, no increased work of breathing, no n/v, denies ab pain, he wants to eat He is hard of hearing,   Assessment/Plan: Principal Problem:   HCAP (healthcare-associated pneumonia) Active Problems:   Essential hypertension, benign   GERD   Anemia   BPH (benign prostatic hyperplasia)   Severe sepsis ([place])   Fever   AKI (acute kidney injury) ([place])   Elevated lactic acid level   sepsis on presentation with fever, lactic acidosis, tachycardia,  Tachypnea With abnormal LFT, biliary duct dilatation on CT ,  He does not  appear to have pneumonia clinically, no cough, lung clear on exam He is started on vanc and meropenem on admission,  Blood culture no growth, mrsa screening negative, repeat covid testing negative, ua unremarkable Recent CBD stent, suspect source of infection is intraabdominal, continue meropenem, d/c vanc, continue hold home meds betablocker [facility] consulted  Pm Addendum: bp improving in pm, resume low dose betablocker with holding parameters  history of Enterobacter/Klebsiella bacteremia: per blood cultures drawn on day of admission at [facility] on 09/11/18. As described above, source not completely clear, although biliary is a possibility. Received 4 days of Meropenem before being discharged to home on 09/14/18 on 10 additional days of Cipro/Flagyl in order to complete a total antibiotic course of 14 days    Pancreatic Adenocarcinoma: appears to have been diagnosed with such in early April 2020, and follows with Dr. Benay Spice as outpatient oncologist, who suspects stage 4 disease in setting of right lung nodule. Per review of documentation from outpatient oncology appointment on 08/26/18, Dr. Benay Spice recommended refraining from chemotherapy, but rather recommended observations with plan for 4 week follow-up given his concerns for patient's ability to tolerate chemotherapy given his advanced age. Oncology follow-up appt scheduled to occur on 09/29/18. Of note, course associated with biliary obstruction, prompting biliary stent placement on 08/27/18.   Hypokalemia/hypomagnesemia Replace k and mag, repeat in am  AKI  Likely prerenal Cr baseline 0.7, cr on presentation is 1.15 Cr improving this am Renal dosing meds  Anemia of chronic disease, no overt sign of bleeding Could have intermittent gi bleed due to h/o pancreatic cancer Monitor hgb  Thrombocytopenia Seems to be fluctuating from 2019,  plt was in the 80's in 2019 plt today is 18 He is not on heparin this hospitalization Will  check b12/folate Acute drop of plt, likely due to infection Monitor    mild lower extremity edema, left > Right Will get venous doppler to rule out DVt, clinically he appear dehydrated Recent echo done on 4/27 lvef wnl   FTT, overall poor prognosis Per family patient as baseline walks with a cane 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 He is much weaker than his baseline due to recent procedures and recurrent hospitalization Code Status: DNR  Family Communication: patient and daughter in law over the phone  Disposition Plan: not ready to discharge   Consultants:  [facility]  Procedures:  none  Antibiotics:  As above   Objective: BP 140/78 (BP Location: Left Arm)    Pulse 73    Temp (!) 97.4 F (36.3 C) (Oral)    Resp 18    Ht 5\' 10"  (1.778 m)    Wt 59 kg Comment: scale b   SpO2 96%    BMI 18.65 kg/m   Intake/Output Summary (Last 24 hours) at 09/17/2018 0758 Last data filed at 09/17/2018 0600 Gross per 24 hour  Intake 1240 ml  Output 800 ml  Net 440 ml   Filed Weights   09/16/18 1724 09/16/18 2140 09/17/18 0347  Weight: 56 kg 58.2 kg 59 kg    Exam: Patient is examined daily including today on 09/17/2018, exams remain the same as of yesterday except that has changed    General:  Frail, hard of hearing ,NAD  Cardiovascular: RRR  Respiratory: CTABL  Abdomen: Soft/ND/NT, positive BS  Musculoskeletal: bilateral lower extremity edema, left > right  Neuro: alert, oriented   Data Reviewed: Basic Metabolic Panel: Recent Labs  Lab 09/11/18 2304 09/12/18 0453 09/13/18 0124 09/14/18 0446 09/16/18 1737 09/16/18 1815 09/16/18 2152 09/17/18 0400  NA 137 137 135 135 135 137  --  138  K 3.2* 3.5 4.2 4.3 3.5 3.3*  --  3.3*  CL 111 109 110 109 103  --   --  110  CO2 19* 22 22 22  21*  --   --  20*  GLUCOSE 126* 104* 117* 118* 235*  --   --  138*  BUN 9 9 14 12  6*  --   --  6*  CREATININE 0.78 0.71 0.70 0.66 1.15  --   --  0.93    CALCIUM 6.6* 7.1* 7.1* 7.5* 8.1*  --   --  7.5*  MG 1.3*  --   --   --   --   --  1.6* 1.5*   Liver Function Tests: Recent Labs  Lab 09/12/18 0453 09/13/18 0124 09/14/18 0446 09/16/18 1737 09/17/18 0400  AST 150* 65* 33 108* 57*  ALT 86* 65* 45* 62* 43  ALKPHOS 388* 358* 355* 555* 380*  BILITOT 2.6* 8.9* 2.5* 3.3* 2.5*  PROT 4.0* 4.2* 4.4* 5.1* 4.0*  ALBUMIN 1.9* 2.0* 2.2* 2.5* 1.9*   Recent Labs  Lab 09/16/18 1737  LIPASE 14   No results for input(s): AMMONIA in the last 168 hours. CBC: Recent Labs  Lab 09/11/18 2210 09/12/18 0453  09/13/18 0124 09/14/18 0446 09/16/18 1737 09/16/18 1815 09/17/18 0400  WBC 3.9* 5.4  --  6.0 4.3 4.1  --  4.2  NEUTROABS 2.7  --   --   --   --  3.4  --  3.0  HGB  10.5* 7.3*   < > 9.6* 10.2* 10.9* 10.2* 8.8*  HCT 31.3* 22.2*   < > 28.1* 29.6* 32.5* 30.0* 26.0*  MCV 108.7* 107.2*  --  98.3 99.0 100.3*  --  100.0  PLT 143* 98*  --  95* 90* 69*  --  63*   < > = values in this interval not displayed.   Cardiac Enzymes:   Recent Labs  Lab 09/11/18 2304  TROPONINI <0.03   BNP (last 3 results) No results for input(s): BNP in the last 8760 hours.  ProBNP (last 3 results) No results for input(s): PROBNP in the last 8760 hours.  CBG: No results for input(s): GLUCAP in the last 168 hours.  Recent Results (from the past 240 hour(s))  Blood Culture (routine x 2)     Status: None   Collection Time: 09/11/18 10:10 PM  Result Value Ref Range Status   Specimen Description BLOOD LEFT ANTECUBITAL  Final   Special Requests   Final    BOTTLES DRAWN AEROBIC AND ANAEROBIC Blood Culture results may not be optimal due to an inadequate volume of blood received in culture bottles   Culture   Final    NO GROWTH 5 DAYS Performed at [facility], [address] [place], [place] [address]    Report Status 09/16/2018 FINAL  Final  Blood Culture (routine x 2)     Status: Abnormal   Collection Time: 09/11/18 10:10 PM  Result Value Ref  Range Status   Specimen Description   Final    BLOOD RIGHT WRIST Performed at [facility], [address], [place], [place] [address]    Special Requests   Final    BOTTLES DRAWN AEROBIC AND ANAEROBIC Blood Culture results may not be optimal due to an inadequate volume of blood received in culture bottles Performed at [facility], [address] [place], [place] [address]    Culture  Setup Time   Final    Organism ID to follow [place] CRITICAL RESULT CALLED TO, READ BACK BY AND VERIFIED WITH: [facility] MARTIN AT 1236 09/12/2018.PMF GRAM STAIN REVIEWED-AGREE WITH RESULT T. TYSOR Performed at [facility], [place] [address], [place], [place] [address]    Culture KLEBSIELLA PNEUMONIAE (A)  Final   Report Status 09/14/2018 FINAL  Final   Organism ID, Bacteria KLEBSIELLA PNEUMONIAE  Final      Susceptibility   Klebsiella pneumoniae - MIC*    AMPICILLIN >=32 RESISTANT Resistant     CEFAZOLIN <=4 SENSITIVE Sensitive     CEFEPIME <=1 SENSITIVE Sensitive     CEFTAZIDIME <=1 SENSITIVE Sensitive     CEFTRIAXONE <=1 SENSITIVE Sensitive     CIPROFLOXACIN <=0.25 SENSITIVE Sensitive     GENTAMICIN <=1 SENSITIVE Sensitive     IMIPENEM <=0.25 SENSITIVE Sensitive     TRIMETH/SULFA <=20 SENSITIVE Sensitive     AMPICILLIN/SULBACTAM 16 INTERMEDIATE Intermediate     PIP/TAZO 16 SENSITIVE Sensitive     Extended ESBL NEGATIVE Sensitive     * KLEBSIELLA PNEUMONIAE  Urine culture     Status: None   Collection Time: 09/11/18 10:10 PM  Result Value Ref Range Status   Specimen Description   Final    URINE, RANDOM Performed at [facility], [address] [place], [place] [address]    Special Requests   Final    Normal Performed at [facility], [address], [place], [place] [address]    Culture  Final    NO GROWTH Performed at [facility], [place] [address], [place], [place] [address]    Report Status 09/13/2018  FINAL  Final  SARS Coronavirus 2 [facility] order, Performed in [place] hospital lab)     Status: None   Collection Time: 09/11/18 10:10 PM  Result Value Ref Range Status   SARS Coronavirus 2 NEGATIVE NEGATIVE Final    Comment: (NOTE) If result is NEGATIVE SARS-CoV-2 target nucleic acids are NOT DETECTED. The SARS-CoV-2 RNA is generally detectable in upper and lower  respiratory specimens during the acute phase of infection. The lowest  concentration of SARS-CoV-2 viral copies this assay can detect is 250  copies / mL. A negative result does not preclude SARS-CoV-2 infection  and should not be used as the sole basis for treatment or other  patient management decisions.  A negative result may occur with  improper specimen collection / handling, submission of specimen other  than nasopharyngeal swab, presence of viral mutation(s) within the  areas targeted by this assay, and inadequate number of viral copies  (<250 copies / mL). A negative result must be combined with clinical  observations, patient history, and epidemiological information. If result is POSITIVE SARS-CoV-2 target nucleic acids are DETECTED. The SARS-CoV-2 RNA is generally detectable in upper and lower  respiratory specimens dur ing the acute phase of infection.  Positive  results are indicative of active infection with SARS-CoV-2.  Clinical  correlation with patient history and other diagnostic information is  necessary to determine patient infection status.  Positive results do  not rule out bacterial infection or co-infection with other viruses. If result is PRESUMPTIVE POSTIVE SARS-CoV-2 nucleic acids MAY BE PRESENT.   A presumptive positive result was obtained on the submitted specimen  and confirmed on repeat testing.  While 2019 novel coronavirus  (SARS-CoV-2) nucleic acids may be present in the submitted sample  additional confirmatory testing may be necessary for epidemiological  and / or clinical  management purposes  to differentiate between  SARS-CoV-2 and other Sarbecovirus currently known to infect humans.  If clinically indicated additional testing with an alternate test  methodology [phone] is advised. The SARS-CoV-2 RNA is generally  detectable in upper and lower respiratory sp ecimens during the acute  phase of infection. The expected result is Negative. Fact Sheet for Patients:  StrictlyIdeas.no Fact Sheet for Healthcare Providers: BankingDealers.co.za This test is not yet approved or cleared by the [place] FDA and has been authorized for detection and/or diagnosis of SARS-CoV-2 by FDA under an Emergency Use Authorization (EUA).  This EUA will remain in effect (meaning this test can be used) for the duration of the COVID-19 declaration under Section 564(b)(1) of the Act, 21 U.S.C. section 360bbb-3(b)(1), unless the authorization is terminated or revoked sooner. Performed at [facility], [address] [place], [place] [address]   Blood Culture ID Panel (Reflexed)     Status: Abnormal   Collection Time: 09/11/18 10:10 PM  Result Value Ref Range Status   Enterococcus species NOT DETECTED NOT DETECTED Final   Listeria monocytogenes NOT DETECTED NOT DETECTED Final   Staphylococcus species NOT DETECTED NOT DETECTED Final   Staphylococcus aureus (BCID) NOT DETECTED NOT DETECTED Final   Streptococcus species NOT DETECTED NOT DETECTED Final   Streptococcus agalactiae NOT DETECTED NOT DETECTED Final   Streptococcus pneumoniae NOT DETECTED NOT DETECTED Final   Streptococcus pyogenes NOT DETECTED NOT DETECTED Final   Acinetobacter baumannii NOT DETECTED NOT DETECTED Final  Enterobacteriaceae species DETECTED (A) NOT DETECTED Final    Comment: Enterobacteriaceae represent a large family of gram-negative bacteria, not a single organism. CRITICAL RESULT CALLED TO, READ BACK BY AND VERIFIED WITH: [facility] MARTIN  AT 1236 09/12/2018.PMF    Enterobacter cloacae complex NOT DETECTED NOT DETECTED Final   Escherichia coli NOT DETECTED NOT DETECTED Final   Klebsiella oxytoca NOT DETECTED NOT DETECTED Final   Klebsiella pneumoniae DETECTED (A) NOT DETECTED Final    Comment: CRITICAL RESULT CALLED TO, READ BACK BY AND VERIFIED WITH: [facility] MARTIN AT 1236 09/12/2018.PMF    Proteus species NOT DETECTED NOT DETECTED Final   Serratia marcescens NOT DETECTED NOT DETECTED Final   Carbapenem resistance NOT DETECTED NOT DETECTED Final   Haemophilus influenzae NOT DETECTED NOT DETECTED Final   Neisseria meningitidis NOT DETECTED NOT DETECTED Final   Pseudomonas aeruginosa NOT DETECTED NOT DETECTED Final   Candida albicans NOT DETECTED NOT DETECTED Final   Candida glabrata NOT DETECTED NOT DETECTED Final   Candida krusei NOT DETECTED NOT DETECTED Final   Candida parapsilosis NOT DETECTED NOT DETECTED Final   Candida tropicalis NOT DETECTED NOT DETECTED Final    Comment: Performed at [facility], [address] [place], [place] [address]  Culture, blood (single) w Reflex to ID Panel     Status: None (Preliminary result)   Collection Time: 09/14/18  4:46 AM  Result Value Ref Range Status   Specimen Description BLOOD LEFT ARM  Final   Special Requests   Final    BOTTLES DRAWN AEROBIC AND ANAEROBIC Blood Culture adequate volume   Culture   Final    NO GROWTH 3 DAYS Performed at [facility], [address], [place], [place] [address]    Report Status PENDING  Incomplete  Blood Culture (routine x 2)     Status: None (Preliminary result)   Collection Time: 09/16/18  5:41 PM  Result Value Ref Range Status   Specimen Description BLOOD LEFT WRIST  Final   Special Requests   Final    BOTTLES DRAWN AEROBIC AND ANAEROBIC Blood Culture adequate volume   Culture   Final    NO GROWTH < 12 HOURS Performed at [facility], [place] [address] [place], [place] [address]    Report Status PENDING   Incomplete  Blood Culture (routine x 2)     Status: None (Preliminary result)   Collection Time: 09/16/18  5:50 PM  Result Value Ref Range Status   Specimen Description BLOOD RIGHT WRIST  Final   Special Requests   Final    BOTTLES DRAWN AEROBIC AND ANAEROBIC Blood Culture results may not be optimal due to an inadequate volume of blood received in culture bottles   Culture   Final    NO GROWTH < 12 HOURS Performed at [facility], [place] [address] [place], [place] [address]    Report Status PENDING  Incomplete  SARS Coronavirus 2 [facility] order, Performed in [place] hospital lab)     Status: None   Collection Time: 09/16/18  5:55 PM  Result Value Ref Range Status   SARS Coronavirus 2 NEGATIVE NEGATIVE Final    Comment: (NOTE) If result is NEGATIVE SARS-CoV-2 target nucleic acids are NOT DETECTED. The SARS-CoV-2 RNA is generally detectable in upper and lower  respiratory specimens during the acute phase of infection. The lowest  concentration of SARS-CoV-2 viral copies this assay can detect is 250  copies / mL. A negative result does not preclude  SARS-CoV-2 infection  and should not be used as the sole basis for treatment or other  patient management decisions.  A negative result may occur with  improper specimen collection / handling, submission of specimen other  than nasopharyngeal swab, presence of viral mutation(s) within the  areas targeted by this assay, and inadequate number of viral copies  (<250 copies / mL). A negative result must be combined with clinical  observations, patient history, and epidemiological information. If result is POSITIVE SARS-CoV-2 target nucleic acids are DETECTED. The SARS-CoV-2 RNA is generally detectable in upper and lower  respiratory specimens dur ing the acute phase of infection.  Positive  results are indicative of active infection with SARS-CoV-2.  Clinical  correlation with patient history and other diagnostic information is    necessary to determine patient infection status.  Positive results do  not rule out bacterial infection or co-infection with other viruses. If result is PRESUMPTIVE POSTIVE SARS-CoV-2 nucleic acids MAY BE PRESENT.   A presumptive positive result was obtained on the submitted specimen  and confirmed on repeat testing.  While 2019 novel coronavirus  (SARS-CoV-2) nucleic acids may be present in the submitted sample  additional confirmatory testing may be necessary for epidemiological  and / or clinical management purposes  to differentiate between  SARS-CoV-2 and other Sarbecovirus currently known to infect humans.  If clinically indicated additional testing with an alternate test  methodology [id number] is advised. The SARS-CoV-2 RNA is generally  detectable in upper and lower respiratory sp ecimens during the acute  phase of infection. The expected result is Negative. Fact Sheet for Patients:  StrictlyIdeas.no Fact Sheet for Healthcare Providers: BankingDealers.co.za This test is not yet approved or cleared by the [place] FDA and has been authorized for detection and/or diagnosis of SARS-CoV-2 by FDA under an Emergency Use Authorization (EUA).  This EUA will remain in effect (meaning this test can be used) for the duration of the COVID-19 declaration under Section 564(b)(1) of the Act, 21 U.S.C. section 360bbb-3(b)(1), unless the authorization is terminated or revoked sooner. Performed at [facility], [place] [address], [place], [place] [address]   MRSA PCR Screening     Status: None   Collection Time: 09/16/18 11:05 PM  Result Value Ref Range Status   MRSA by PCR NEGATIVE NEGATIVE Final    Comment:        The GeneXpert MRSA Assay (FDA approved for NASAL specimens only), is one component of a comprehensive MRSA colonization surveillance program. It is not intended to diagnose MRSA infection nor to guide or monitor  treatment for MRSA infections. Performed at [facility], [place] [address] [place], [place] [address]      Studies: Ct Abdomen Pelvis W Contrast  Result Date: 09/16/2018 CLINICAL DATA:  Pancreatic cancer with stent in place. Now with sepsis. On antibiotics. Altered mental status. Fever. EXAM: CT ABDOMEN AND PELVIS WITH CONTRAST TECHNIQUE: Multidetector CT imaging of the abdomen and pelvis was performed using the standard protocol following bolus administration of intravenous contrast. CONTRAST:  123mL OMNIPAQUE IOHEXOL 300 MG/ML  SOLN COMPARISON:  09/12/2018 right upper quadrant ultrasound. Most recent CT of 08/18/2018. FINDINGS: Lower chest: Increased left and new right base dependent opacity, most consistent with atelectasis. This likely obscures the previously described right lung base pulmonary nodule. There are right lung base nodules on the order of 2 mm which are felt to be similar to on the prior. Small bilateral pleural effusions, increased. Hepatobiliary: Since 08/18/2018, placement of  a common duct stent. This originates in the porta hepatis and terminates in the descending duodenum. There is minimal pneumobilia. Increased intrahepatic duct dilatation, with the right hepatic duct measuring 8 mm on image 19/3 versus 6 mm on 08/18/2018. The common duct measures 15 mm just proximal to the stent versus 10 mm at this level on 08/18/2018 CT. Pancreas: Pancreatic atrophy and upstream duct dilatation. This continues to the level of a pancreatic head/uncinate process mass which measures on the order of 4.3 x 2.4 cm on image 31/3. Compare 3.5 x 2.1 cm on the prior. Redemonstration of SMA involvement with tumor on image 31/3. No superimposed pancreatitis. Spleen: Normal in size, without focal abnormality. Adrenals/Urinary Tract: Normal adrenal glands. Bilateral too small to characterize renal lesions. Left renal cysts. Left-sided bladder diverticulum. Stomach/Bowel: The stomach is underdistended.  Apparent wall thickening, including proximally on image 18/3, is at least partially felt to be secondary. Extensive colonic diverticulosis. Normal terminal ileum. Normal small bowel. Vascular/Lymphatic: Aortic and branch vessel atherosclerosis. SMA involvement with tumor as detailed above. There is also contact with this SMV. 1.4 cm portacaval node is newly enlarged since the prior, including on image 26/3. No pelvic sidewall adenopathy. Reproductive: Mild prostatomegaly. Other: Tiny fat containing left inguinal hernia. Development of small volume abdominopelvic ascites. Musculoskeletal: No acute osseous abnormality. Lumbosacral spondylosis. IMPRESSION: 1. Since 08/18/2018, mild interval enlargement of the pancreatic head/uncinate process mass. 2. Interval placement of a common duct stent. Slight increase in mild to moderate intrahepatic and proximal extrahepatic duct dilatation. Correlate with stent function. 3. Developing portacaval adenopathy, favored to represent metastatic disease. 4. New small volume abdominopelvic ascites. 5. Increase in small bilateral pleural effusions with developing bibasilar atelectasis. Right base pulmonary nodules as detailed previously. 6. Gastric underdistention. Apparent wall thickening is likely secondary. Gastritis cannot be excluded. Electronically Signed   By: Abigail Miyamoto M.D.   On: 09/16/2018 20:47   Dg Chest Port 1 View  Result Date: 09/16/2018 CLINICAL DATA:  85 year old male with shortness of breath and fever EXAM: PORTABLE CHEST 1 VIEW COMPARISON:  09/11/2018 FINDINGS: Cardiomediastinal silhouette unchanged in size and contour. No evidence of central vascular congestion. No pneumothorax. Low lung volumes with coarsened interstitial markings and no new confluent airspace disease. Left basilar opacity with blunting of the costophrenic angle. IMPRESSION: Low lung volumes with left basilar opacity, potentially small pleural fluid and associated atelectasis/consolidation  Electronically Signed   By: Corrie Mckusick D.O.   On: 09/16/2018 18:28    Scheduled Meds:  feeding supplement (ENSURE ENLIVE)  237 mL Oral BID BM   ferrous sulfate  325 mg Oral Q breakfast   multivitamin with minerals  1 tablet Oral Daily   pantoprazole  40 mg Oral Daily    Continuous Infusions:  sodium chloride 100 mL/hr at 09/16/18 2351   meropenem (MERREM) IV     [START ON 09/18/2018] vancomycin       Time spent: 20mins I have personally reviewed and interpreted on  09/17/2018 daily labs, tele strips, imagings as discussed above under date review session and assessment and plans.  I reviewed all nursing notes, pharmacy notes, consultant notes,  vitals, pertinent old records  I have discussed plan of care as described above with RN , patient  on 09/17/2018   Florencia Reasons MD, PhD  [facility] Pager [phone]. If 7PM-7AM, please contact night-coverage at [email or web address], password [facility] 09/17/2018, 7:58 AM  LOS: 1 day

## 2018-09-17 NOTE — Plan of Care (Signed)

## 2018-09-18 ENCOUNTER — Inpatient Hospital Stay (HOSPITAL_COMMUNITY): Payer: Medicare Other

## 2018-09-18 DIAGNOSIS — E876 Hypokalemia: Secondary | ICD-10-CM

## 2018-09-18 DIAGNOSIS — I82409 Acute embolism and thrombosis of unspecified deep veins of unspecified lower extremity: Secondary | ICD-10-CM

## 2018-09-18 DIAGNOSIS — R609 Edema, unspecified: Secondary | ICD-10-CM

## 2018-09-18 LAB — CBC WITH DIFFERENTIAL/PLATELET
Abs Immature Granulocytes: 0.03 10*3/uL (ref 0.00–0.07)
Basophils Absolute: 0.1 10*3/uL (ref 0.0–0.1)
Basophils Relative: 1 %
Eosinophils Absolute: 0.6 10*3/uL — ABNORMAL HIGH (ref 0.0–0.5)
Eosinophils Relative: 13 %
HCT: 29.5 % — ABNORMAL LOW (ref 39.0–52.0)
Hemoglobin: 10.1 g/dL — ABNORMAL LOW (ref 13.0–17.0)
Immature Granulocytes: 1 %
Lymphocytes Relative: 17 %
Lymphs Abs: 0.7 10*3/uL (ref 0.7–4.0)
MCH: 33.7 pg (ref 26.0–34.0)
MCHC: 34.2 g/dL (ref 30.0–36.0)
MCV: 98.3 fL (ref 80.0–100.0)
Monocytes Absolute: 0.4 10*3/uL (ref 0.1–1.0)
Monocytes Relative: 10 %
Neutro Abs: 2.6 10*3/uL (ref 1.7–7.7)
Neutrophils Relative %: 58 %
Platelets: 79 10*3/uL — ABNORMAL LOW (ref 150–400)
RBC: 3 MIL/uL — ABNORMAL LOW (ref 4.22–5.81)
RDW: 20.8 % — ABNORMAL HIGH (ref 11.5–15.5)
WBC: 4.4 10*3/uL (ref 4.0–10.5)
nRBC: 0 % (ref 0.0–0.2)

## 2018-09-18 LAB — COMPREHENSIVE METABOLIC PANEL
ALT: 39 U/L (ref 0–44)
AST: 38 U/L (ref 15–41)
Albumin: 2.2 g/dL — ABNORMAL LOW (ref 3.5–5.0)
Alkaline Phosphatase: 375 U/L — ABNORMAL HIGH (ref 38–126)
Anion gap: 9 (ref 5–15)
BUN: 5 mg/dL — ABNORMAL LOW (ref 8–23)
CO2: 20 mmol/L — ABNORMAL LOW (ref 22–32)
Calcium: 8.1 mg/dL — ABNORMAL LOW (ref 8.9–10.3)
Chloride: 109 mmol/L (ref 98–111)
Creatinine, Ser: 0.8 mg/dL (ref 0.61–1.24)
GFR calc Af Amer: >60 mL/min (ref >60)
GFR calc non Af Amer: >60 mL/min (ref >60)
Glucose, Bld: 106 mg/dL — ABNORMAL HIGH (ref 70–99)
Potassium: 3.6 mmol/L (ref 3.5–5.1)
Sodium: 138 mmol/L (ref 135–145)
Total Bilirubin: 1.8 mg/dL — ABNORMAL HIGH (ref 0.3–1.2)
Total Protein: 4.7 g/dL — ABNORMAL LOW (ref 6.5–8.1)

## 2018-09-18 LAB — FOLATE: Folate: 20 ng/mL (ref >5.9)

## 2018-09-18 LAB — VITAMIN B12: Vitamin B-12: 776 pg/mL (ref 180–914)

## 2018-09-18 LAB — MAGNESIUM: Magnesium: 2 mg/dL (ref 1.7–2.4)

## 2018-09-18 MED ORDER — METRONIDAZOLE 500 MG PO TABS
500.0000 mg | ORAL_TABLET | Freq: Three times a day (TID) | ORAL | Status: DC
  Administered 2018-09-18 – 2018-09-19 (×4): 500 mg via ORAL
  Filled 2018-09-18 (×4): qty 1

## 2018-09-18 MED ORDER — POTASSIUM CHLORIDE CRYS ER 20 MEQ PO TBCR
40.0000 meq | EXTENDED_RELEASE_TABLET | Freq: Once | ORAL | Status: AC
  Administered 2018-09-18: 40 meq via ORAL
  Filled 2018-09-18: qty 2

## 2018-09-18 MED ORDER — PRAMIPEXOLE DIHYDROCHLORIDE 0.125 MG PO TABS
0.1250 mg | ORAL_TABLET | Freq: Every day | ORAL | Status: DC
  Administered 2018-09-18: 0.125 mg via ORAL
  Filled 2018-09-18 (×2): qty 1

## 2018-09-18 MED ORDER — CIPROFLOXACIN HCL 500 MG PO TABS
500.0000 mg | ORAL_TABLET | Freq: Two times a day (BID) | ORAL | Status: DC
  Administered 2018-09-18 – 2018-09-19 (×3): 500 mg via ORAL
  Filled 2018-09-18 (×3): qty 1

## 2018-09-18 NOTE — Progress Notes (Signed)
[facility] Gastroenterology Progress Note  Jose [name] WPVXY 85 y.o. Feb 12, 1924   Subjective: Denies abdominal pain. Feels good. Complaining of foot pain. Wants to go home.  Objective: Vital signs: Vitals:   09/17/18 1754 09/17/18 2009  BP: (!) 145/75 (!) 160/81  Pulse: 86 88  Resp: 20 18  Temp: 97.9 F (36.6 C) 98.4 F (36.9 C)  SpO2: 96% 97%    Physical Exam: Gen: elderly, alert, no acute distress, hard of hearing, pleasant HEENT: anicteric sclera CV: RRR Chest: CTA B Abd: soft, nontender, nondistended, +BS Ext: no edema  Lab Results: Recent Labs    09/17/18 0400 09/18/18 0356  NA 138 138  K 3.3* 3.6  CL 110 109  CO2 20* 20*  GLUCOSE 138* 106*  BUN 6* 5*  CREATININE 0.93 0.80  CALCIUM 7.5* 8.1*  MG 1.5* 2.0   Recent Labs    09/17/18 0400 09/18/18 0356  AST 57* 38  ALT 43 39  ALKPHOS 380* 375*  BILITOT 2.5* 1.8*  PROT 4.0* 4.7*  ALBUMIN 1.9* 2.2*   Recent Labs    09/17/18 0400 09/18/18 0356  WBC 4.2 4.4  NEUTROABS 3.0 2.6  HGB 8.8* 10.1*  HCT 26.0* 29.5*  MCV 100.0 98.3  PLT 63* 79*      Assessment/Plan: Bacteremia likely a biliary source s/p biliary stent in early April with pancreatic cancer. Question of pneumonia on admit. Covid test negative on 09/16/18. TB improving and AST/ALT normal now. ALP 375. Last reported fever 09/16/18 of T 102.9. No fever since then on IV Meropenem. Clinically stable but would not recommend discharging today without a trial of oral Abx as inpt. Will transition to oral Abx (Cipro/Flagyl based on sensitivity from [facility] blood culture) and if does ok without fevers overnight consider discharge tomorrow with outpt ERCP by Dr. Watt Climes next week. Dr. Alessandra Bevels to see on rounds tomorrow.   Jose [name] 09/18/2018, 1:13 PM  Questions please call [phone] ID: Jose [name], male   DOB: Feb 23, 1924, 85 y.o.   MRN: [id number]

## 2018-09-18 NOTE — Progress Notes (Signed)
Physical Therapy Treatment Patient Details Name: Jose [name] MRN: [id number] DOB: May 14, 1924 Today's Date: 09/18/2018    History of Present Illness 85 yo admitted with fever and severe sepsis from LLL PNA. PMHx: HTN, dyspnea, HOH, pancreatic CA with bile duct obstruction s/p biliary stent.    PT Comments    Pt performed gt training and functional mobility in room as he is on droplet precautions.  Pt progressed gait distance and tolerated supine and seated therapeutic exercises.  Plan next session for continued strengthening.  Pt remains appropriate to return home with assistance from his family.        Follow Up Recommendations  Supervision/Assistance - 24 hour;Supervision for mobility/OOB;Home health PT     Equipment Recommendations  None recommended by PT    Recommendations for Other Services       Precautions / Restrictions Precautions Precautions: Fall Precaution Comments: impulsive with movement Restrictions Weight Bearing Restrictions: No    Mobility  Bed Mobility Overal bed mobility: Modified Independent             General bed mobility comments: use of bedrail to pull to EoB  Transfers Overall transfer level: Needs assistance Equipment used: Rolling walker (2 wheeled) Transfers: Sit to/from Stand Sit to Stand: Min guard         General transfer comment: Min guard for safety.    Ambulation/Gait Ambulation/Gait assistance: Min guard Gait Distance (Feet): 80 Feet Assistive device: Rolling walker (2 wheeled) Gait Pattern/deviations: Step-through pattern;Decreased step length - right;Decreased step length - left;Trunk flexed;Shuffle Gait velocity: slowed   General Gait Details: hands on min guard for safety, mild instability with no overt LoB, multiple turns performed as gait was performed in his room.     Stairs             Wheelchair Mobility    Modified Rankin (Stroke Patients Only)       Balance Overall balance assessment:  History of Falls;Needs assistance Sitting-balance support: No upper extremity supported;Feet supported Sitting balance-Leahy Scale: Fair       Standing balance-Leahy Scale: Poor                              Cognition Arousal/Alertness: Awake/alert Behavior During Therapy: Impulsive Overall Cognitive Status: Within Functional Limits for tasks assessed                                        Exercises General Exercises - Lower Extremity Long Arc Quad: AROM;10 reps;Seated;Both Heel Slides: AROM;10 reps;Supine;Both Hip ABduction/ADduction: AROM;10 reps;Supine;Both Straight Leg Raises: AROM;10 reps;Both;Supine Hip Flexion/Marching: AROM;Both;10 reps;Seated    General Comments        Pertinent Vitals/Pain Pain Assessment: No/denies pain    Home Living                      Prior Function            PT Goals (current goals can now be found in the care plan section) Acute Rehab PT Goals Patient Stated Goal: go home and get warm Potential to Achieve Goals: Good Progress towards PT goals: Progressing toward goals    Frequency    Min 3X/week      PT Plan Current plan remains appropriate    Co-evaluation  AM-PAC PT "6 Clicks" Mobility   Outcome Measure  Help needed turning from your back to your side while in a flat bed without using bedrails?: A Little Help needed moving from lying on your back to sitting on the side of a flat bed without using bedrails?: A Little Help needed moving to and from a bed to a chair (including a wheelchair)?: A Little Help needed standing up from a chair using your arms (e.g., wheelchair or bedside chair)?: A Little Help needed to walk in hospital room?: A Little Help needed climbing 3-5 steps with a railing? : A Lot 6 Click Score: 17    End of Session Equipment Utilized During Treatment: Gait belt Activity Tolerance: Patient limited by fatigue Patient left: in bed;with bed  alarm set;with call bell/phone within reach Nurse Communication: Mobility status PT Visit Diagnosis: Unsteadiness on feet (R26.81);Other abnormalities of gait and mobility (R26.89);Difficulty in walking, not elsewhere classified (R26.2)     Time: 1007-1219 PT Time Calculation (min) (ACUTE ONLY): 24 min  Charges:  $Gait Training: 8-22 mins $Therapeutic Exercise: 8-22 mins                     Governor Rooks, PTA Acute Rehabilitation Services Pager [phone] Office [phone]     Russell Quinney Eli Hose 09/18/2018, 5:02 PM

## 2018-09-18 NOTE — Consult Note (Signed)
   [facility] CM Inpatient Consult   09/18/2018  Duriel Deery [facility] [birth year] [id number]  Patient screened for extreme high risk score [35%] for unplanned hospitalizations with a readmission within the past 7 days. Review of chart reveals patientis admitted with fever and severe sepsis from LLL pneumonia. PMH of pancreatic CA. Patient was recently set up with Manufacturing [employer] for home palliative care. He also have private caregivers per inpatient [facility] RNCM notes.  Current Therapy notes is recommending 24 hour supervision. Notes reveals patient lives alone with supportive family in the area.  Continue to follow progress and disposition to assess for post hospital care management needs.   Please place a [facility] Care Management consult or for questions contact:   Natividad Brood, RN BSN [place] Liaison  [phone] business mobile phone Toll free office [phone]  Fax number: [phone] [email or web address]  [email or web address]

## 2018-09-18 NOTE — Progress Notes (Signed)
Lower extremity venous has been completed.   Preliminary results in CV Proc.   Abram Sander 09/18/2018 8:26 AM

## 2018-09-18 NOTE — Progress Notes (Signed)
Notified MD for need of clarification on Droplet indication. Also pt is complaining of burning in his feet. Notified MD and waiting any new orders.

## 2018-09-18 NOTE — Evaluation (Signed)
Occupational Therapy Evaluation Patient Details Name: Jose [name] MRN: [id number] DOB: Sep 10, 1923 Today's Date: 09/18/2018    History of Present Illness 85 yo admitted with fever and severe sepsis from LLL PNA. PMHx: HTN, dyspnea, HOH, pancreatic CA with bile duct obstruction s/p biliary stent.   Clinical Impression   PTA, pt was living alone but had 24/7 support by family and was performing ADL and used RW for functional mobility; family performed IADLs (he goes to their house for meals) and encouraged use of RW. Pt presenting with decreased strength, balance, and activity tolerance. Pt would benefit from further acute OT to facilitate safe dc. Recommend dc to home with HHOT for further OT to optimize safety, independence with ADLs, and return to PLOF    Follow Up Recommendations  Home health OT;Supervision/Assistance - 24 hour    Equipment Recommendations  None recommended by OT(Pt has appropriate DME)    Recommendations for Other Services       Precautions / Restrictions Precautions Precautions: Fall Precaution Comments: impulsive with movement Restrictions Weight Bearing Restrictions: No      Mobility Bed Mobility Overal bed mobility: Modified Independent             General bed mobility comments: use of bedrail to pull to EoB  Transfers Overall transfer level: Needs assistance Equipment used: Rolling walker (2 wheeled) Transfers: Sit to/from Stand Sit to Stand: Min guard         General transfer comment: Min guard for safety.      Balance Overall balance assessment: History of Falls;Needs assistance Sitting-balance support: No upper extremity supported;Feet supported Sitting balance-Leahy Scale: Fair     Standing balance support: Single extremity supported;During functional activity;Bilateral upper extremity supported Standing balance-Leahy Scale: Poor Standing balance comment: requires BUE for dynamic tasks                            ADL either performed or assessed with clinical judgement   ADL Overall ADL's : Needs assistance/impaired     Grooming: Min guard;Standing;Wash/dry hands Grooming Details (indicate cue type and reason): sink Upper Body Bathing: Min guard;Sitting   Lower Body Bathing: Min guard;Sitting/lateral leans   Upper Body Dressing : Set up;Sitting   Lower Body Dressing: Minimal assistance;Sit to/from stand   Toilet Transfer: Min guard;Ambulation;RW   Toileting- Clothing Manipulation and Hygiene: Supervision/safety;Sitting/lateral lean   Tub/ Shower Transfer: Walk-in shower;Min guard;Ambulation;Rolling walker;Grab bars   Functional mobility during ADLs: Min guard;Rolling walker       Vision Patient Visual Report: No change from baseline       Perception     Praxis      Pertinent Vitals/Pain Pain Assessment: No/denies pain     Hand Dominance Right   Extremity/Trunk Assessment Upper Extremity Assessment Upper Extremity Assessment: Overall WFL for tasks assessed   Lower Extremity Assessment Lower Extremity Assessment: Defer to PT evaluation       Communication Communication Communication: HOH   Cognition Arousal/Alertness: Awake/alert Behavior During Therapy: Impulsive Overall Cognitive Status: Within Functional Limits for tasks assessed                                 General Comments: decreased awareness of deficits   General Comments  Pt had 13 blankets on bed and requesting more warm blankets. Heat all the way up in the room.     Exercises General Exercises - Lower  Extremity Long Arc Quad: AROM;10 reps;Seated;Both Heel Slides: AROM;10 reps;Supine;Both Hip ABduction/ADduction: AROM;10 reps;Supine;Both Straight Leg Raises: AROM;10 reps;Both;Supine Hip Flexion/Marching: AROM;Both;10 reps;Seated   Shoulder Instructions      Home Living Family/patient expects to be discharged to:: Private residence Living Arrangements: Alone Available Help at  Discharge: Family Type of Home: House Home Access: [place]: One level     Bathroom Shower/Tub: Occupational psychologist: [place] Accessibility: Yes How Accessible: Accessible via walker [place]: [place] - 2 wheels;Shower seat;Grab bars - tub/shower;Hand held shower head          Prior Functioning/Environment Level of Independence: Needs assistance  Gait / Transfers Assistance Needed: ambulates with Rw ADL's / Homemaking Assistance Needed: independent with ADLs, family provides assist for iADLs Communication / Swallowing Assistance Needed: HOH          OT Problem List: Decreased strength;Decreased activity tolerance;Impaired balance (sitting and/or standing);Decreased safety awareness      OT Treatment/Interventions: Self-care/ADL training;DME and/or AE instruction;Energy conservation;Therapeutic activities;Patient/family education;Balance training    OT Goals(Current goals can be found in the care plan section) Acute Rehab OT Goals Patient Stated Goal: go home and get warm OT Goal Formulation: With patient Time For Goal Achievement: 10/02/18 Potential to Achieve Goals: Good ADL Goals Pt Will Perform Grooming: with modified independence;standing Pt Will Perform Upper Body Dressing: with modified independence;sitting Pt Will Perform Lower Body Dressing: with modified independence;sit to/from stand Pt Will Transfer to Toilet: with modified independence;ambulating Pt Will Perform Toileting - Clothing Manipulation and hygiene: with modified independence;sit to/from stand  OT Frequency: Min 2X/week   Barriers to D/C:            Co-evaluation              AM-PAC OT "6 Clicks" Daily Activity     Outcome Measure Help from another person eating meals?: None Help from another person taking care of personal grooming?: A Little Help from another person toileting, which includes using toliet, bedpan, or urinal?: A  Little Help from another person bathing (including washing, rinsing, drying)?: A Little Help from another person to put on and taking off regular upper body clothing?: A Little Help from another person to put on and taking off regular lower body clothing?: A Little 6 Click Score: 19   End of Session Equipment Utilized During Treatment: Gait belt;Rolling walker Nurse Communication: Mobility status  Activity Tolerance: Patient tolerated treatment well Patient left: in bed;with call bell/phone within reach;with bed alarm set  OT Visit Diagnosis: Unsteadiness on feet (R26.81);Other abnormalities of gait and mobility (R26.89);History of falling (Z91.81);Muscle weakness (generalized) (M62.81)                Time: 2111-7356 OT Time Calculation (min): 21 min Charges:  OT General Charges $OT Visit: 1 Visit OT Evaluation $OT Eval Moderate Complexity: Independence OTR/L Acute Rehabilitation Services Pager: [phone] Office: [place] 09/18/2018, 5:44 PM

## 2018-09-18 NOTE — TOC Initial Note (Signed)
Transition of Care [facility]) - Initial/Assessment Note    Patient Details  Name: Jose [name] MRN: [id number] Date of Birth: 1924-03-09  Transition of Care [facility]) CM/SW Contact:    Sherrilyn Rist Phone Number: [phone] 09/18/2018, 9:32 AM  Clinical Narrative:                 CM talked to daughter [name]; patient is active with [facility] for [facility] services as prior to admission; they have private caregivers that assist in the home 8 hrs Wed/ Th /Friday; has is arranged with Outpatient Palliative Care services with [facility]. DME - cane and walker at home; very supportive daughter and son that assist in his care. Mindi Slicker RN,MHA,BSN  09/12/2018 - Clinical Narrative:                 RNCM reached out to patient's son and daughter in law to explain CM role in plan of care.  Daughter in law reports that patient lives alone and is very independent.  Patient has a walker and cane and a wheelchair if needed.  Daughter in law and son provide transportation.  Patient is current with PCP and gets prescriptions at [facility], no trouble obtaining meds.  RNCM will cont to follow, no discharge needs at this time. Fransico Him RN CM   Expected Discharge Plan: Home w Home Health Services Barriers to Discharge: No Barriers Identified   Patient Goals and CMS Choice Patient states their goals for this hospitalization and ongoing recovery are:: to stay at home CMS Medicare.gov Compare Post Acute Care list provided to:: Patient Represenative (must comment)(Daughter [name]) Choice offered to / list presented to : Adult Children  Expected Discharge Plan and Services Expected Discharge Plan: [place] In-house Referral: NA Discharge Planning Services: CM Consult Post Acute Care Choice: [place] arrangements for the past 2 months: Single Family Home                 DME Arranged: N/A DME Agency: NA       HH Arranged: RN, Disease Management, PT [place]  Agency: [place] ([place]) Date HH Agency Contacted: 09/18/18 Time [place]: [phone] Representative spoke with at [place]: [place]  Prior Living Arrangements/Services Living arrangements for the past 2 months: [place] with:: Self Patient language and need for interpreter reviewed:: Yes Do you feel safe going back to the place where you live?: Yes      Need for Family Participation in Patient Care: Yes (Comment) Care giver support system in place?: Yes (comment) Current home services: Home PT Criminal Activity/Legal Involvement Pertinent to Current Situation/Hospitalization: No - Comment as needed  Activities of Daily Living Home Assistive Devices/Equipment: [place] Rile (specify type) ADL Screening (condition at time of admission) Patient's cognitive ability adequate to safely complete daily activities?: Yes Is the patient deaf or have difficulty hearing?: Yes Does the patient have difficulty seeing, even when wearing glasses/contacts?: No Does the patient have difficulty concentrating, remembering, or making decisions?: No Patient able to express need for assistance with ADLs?: Yes Does the patient have difficulty dressing or bathing?: Yes Independently performs ADLs?: No Communication: Independent Dressing (OT): Needs assistance Is this a change from baseline?: Pre-admission baseline Grooming: Needs assistance Is this a change from baseline?: Pre-admission baseline Feeding: Independent Bathing: Needs assistance Is this a change from baseline?: Pre-admission baseline Toileting: Needs assistance Is this a change from baseline?: Pre-admission baseline In/Out Bed: Needs assistance  Is this a change from baseline?: Pre-admission baseline Walks in Home: Needs assistance Is this a change from baseline?: Pre-admission baseline Does the patient have difficulty walking or climbing stairs?: Yes Weakness of Legs: Both Weakness of Arms/Hands:  Both  Permission Sought/Granted Permission sought to share information with : Case Manager Permission granted to share information with : Yes, Verbal Permission Granted  Share Information with NAME: daughter [name]  Permission granted to share info w AGENCY: [place] agency        Emotional Assessment Appearance:: Developmentally appropriate       Alcohol / Substance Use: Not Applicable Psych Involvement: No (comment)  Admission diagnosis:  HAP (hospital-acquired pneumonia) [J18.9, Y95] Patient Active Problem List   Diagnosis Date Noted  . Malignant neoplasm of other parts of pancreas ([place])   . Thrombocytopenia ([place])   . Severe sepsis ([place]) 09/16/2018  . HCAP (healthcare-associated pneumonia) 09/16/2018  . Fever 09/16/2018  . AKI (acute kidney injury) ([place]) 09/16/2018  . Elevated lactic acid level 09/16/2018  . Sepsis ([place]) 09/12/2018  . Pressure injury of skin 09/12/2018  . Pancreatic adenocarcinoma ([place]) 08/24/2018  . BPH (benign prostatic hyperplasia) 04/23/2018  . Obstructive jaundice 03/28/2018  . Tachycardia 03/24/2018  . Malnutrition of moderate degree 03/14/2018  . Pancreatitis due to biliary obstruction 03/12/2018  . Malnutrition of mild degree ([place]) 01/06/2018  . Melanoma of neck ([place])   . Rash 09/05/2016  . Neuropathy 05/07/2016  . Anemia of chronic disease 05/11/2015  . Preventative health care 04/29/2014  . Advanced directives, counseling/discussion 04/29/2014  . Osteoarthrosis involving multiple sites 10/31/2010  . Osteoarthrosis of knee 10/31/2010  . ACTINIC KERATOSIS 12/04/2007  . BPH with obstruction/lower urinary tract symptoms 10/01/2006  . Essential hypertension, benign 09/17/2006  . GERD 09/17/2006  . Cervical radiculopathy 09/17/2006   PCP:  [name], MD Pharmacy:   [place], [address] [place] [address] Phone: [phone] Fax: [phone]     Social  Determinants of Health (SDOH) Interventions    Readmission Risk Interventions Readmission Risk Prevention Plan 09/14/2018  Transportation Screening Complete  HRI or Home Care Consult Complete  Palliative Care Screening Complete  Medication Review (RN Care Manager) Complete  Some recent data might be hidden

## 2018-09-18 NOTE — Progress Notes (Signed)
Initial Nutrition Assessment  RD working remotely.  DOCUMENTATION CODES:   Underweight  INTERVENTION:   -MVI with minerals daily -Continue Ensure Enlive po BID, each supplement provides 350 kcal and 20 grams of protein  NUTRITION DIAGNOSIS:   Increased nutrient needs related to chronic illness(pancreatic cancer) as evidenced by estimated needs.  GOAL:   Patient will meet greater than or equal to 90% of their needs  MONITOR:   PO intake, Supplement acceptance, Diet advancement, Labs, Weight trends, Skin, I & O's  REASON FOR ASSESSMENT:   Other (Comment)    ASSESSMENT:   Jose [name] is a 85 y.o. male with medical history significant for pancreatic adenocarcinoma complicated by common bile duct obstruction status post biliary stent on 08/27/2018, hypertension, chronic iron deficiency anemia, who was admitted to [facility] on 09/16/2018 with severe sepsis due to suspected left lower lobe pneumonia after presenting from home to [facility] emergency department for further evaluation of objective fever.  Pt admitted with severe sepsis due to suspected LLL pneumonia.   Reviewed I/O's: +45 ml x 24 hours and +485 ml since admission  UOP: 2 L x 24 hours  Pt very HOH and unable to provide history over the phone. Unable to obtain further nutrition-related history at this time.   Pt with pancreatic cancer followed by oncology for observation only as pt is a poor chemotherapy candidate.   Of note, pt was recently discharged from [facility]. Per GI notes, suspect fevers are from biliary obstruction. Plan for ERCP for possible bile duct stent debridement with possibility of another stent placement. ERCP will likely be done over the weekend due to scheduling.   Reviewed previous RD notes and pt has a history of malnutrition.Reviewed wt hx, which reveals a 12.4% wt loss over the past year, which while not significant for time frame, is concerning due to advanced age, multiple  co-morbidities, and recent prior hospitalizations. Unable to identify malnutrition at this time. Pt would greatly benefit from addition of oral nutrition supplements.   Labs reviewed.   NUTRITION - FOCUSED PHYSICAL EXAM:    Diet Order:   Diet Order            Diet NPO time specified  Diet effective midnight        Diet full liquid Room service appropriate? Yes; Fluid consistency: Thin  Diet effective now              EDUCATION NEEDS:   No education needs have been identified at this time  Skin:  Skin Assessment: Skin Integrity Issues: Skin Integrity Issues:: Stage I Stage I: sacrum Incisions: n/a  Last BM:  09/17/18  Height:   Ht Readings from Last 1 Encounters:  09/16/18 5\' 10"  (1.778 m)    Weight:   Wt Readings from Last 1 Encounters:  09/18/18 57.4 kg    Ideal Body Weight:  75.5 kg  BMI:  Body mass index is 18.15 kg/m.  Estimated Nutritional Needs:   Kcal:  1550-1750  Protein:  70-85 grams  Fluid:  > 1.5 L    Shylynn Bruning A. Jimmye Norman, RD, LDN, [place] Registered Dietitian II Certified Diabetes Care and Education Specialist Pager: [phone] After hours Pager: [phone]

## 2018-09-18 NOTE — Progress Notes (Signed)
PROGRESS NOTE  Urian Martenson GYF:[id number] DOB: 1924-04-09 DOA: 09/16/2018 PCP: [name], MD  Brief summary:  Jaris Kohles is a 85 y.o. male with medical history significant for pancreatic adenocarcinoma complicated by common bile duct obstruction status post biliary stent on 08/27/2018.  The patient was recently hospitalized at [facility] from 09/10/2020 to 09/14/2018 for sepsis after presenting with objective fever.  Per review of discharge summary, source of underlying infection was undetermined, however blood cultures were positive for Enterobacter and Klebsiella for which the patient was initially started on IV vancomycin and meropenem before reported discontinuation of IV vancomycin on 09/12/2018.  He was found to be COVID-19 negative, influenza a and B negative, urinalysis was not suggestive of urinary tract infection, and chest x-ray reportedly demonstrated no evidence of underlying pneumonia.  the patient was continued on meropenem for 48 hours following resolution of his objective fever, before being transitioned to ciprofloxacin and Flagyl at the time of discharge, with instructions to complete 10 additional days of these oral antibiotics  He was discharged home on 4/27 with plan to follow [facility] with [facility], he returned to [facility] and admitted due to fever on 4/29  HPI/Recap of past 24 hours:  Fever resolved, no cough, no hypoxia at rest, no increased work of breathing, no n/v, denies ab pain, he wonder when he can go home  He c/o legs are cramping, not able to sleep well at night, he request his mirapex  Per RN "Patient has had multiple loose stools today in the commode reports this is normal for him. His bottom slightly red." " there is solid matters in stool" No mention of stool color  Hgb/plt decreasing  Renal function improving, he is eating, will d/c ivf    Assessment/Plan: Principal Problem:   HCAP (healthcare-associated  pneumonia) Active Problems:   Essential hypertension, benign   GERD   Anemia of chronic disease   BPH (benign prostatic hyperplasia)   Severe sepsis (HCC)   Fever   AKI (acute kidney injury) (HCC)   Elevated lactic acid level   Malignant neoplasm of other parts of pancreas (HCC)   Thrombocytopenia (HCC)   Sepsis on presentation with fever, lactic acidosis, tachycardia,  Tachypnea With abnormal LFT, biliary duct dilatation on CT ,  -He does not appear to have pneumonia clinically, no cough, lung clear on exam -He is started on vanc and meropenem on admission,  -Blood culture no growth, mrsa screening negative, repeat covid testing negative, ua unremarkable -Recent CBD stent, suspect source of infection is intraabdominal, continue meropenem, d/c vanc,  -home meds betablocker held on admission due to borderline bp, now bp is improving, resumed on 4/30 pm [facility] consulted, diet advancement /abx per gi, will follow gi recommendation    history of Enterobacter/Klebsiella bacteremia: per blood cultures drawn on day of admission at [facility] on 09/11/18. As described above, source not completely clear, although biliary is a possibility. Received 4 days of Meropenem before being discharged to home on 09/14/18 on 10 additional days of Cipro/Flagyl in order to complete a total antibiotic course of 14 days    Pancreatic Adenocarcinoma: appears to have been diagnosed with such in early April 2020, and follows with Dr. Benay Spice as outpatient oncologist, who suspects stage 4 disease in setting of right lung nodule. Per review of documentation from outpatient oncology appointment on 08/26/18, Dr. Benay Spice recommended refraining from chemotherapy, but rather recommended observations with plan for 4 week follow-up given his  concerns for patient's ability to tolerate chemotherapy given his advanced age. Oncology follow-up appt scheduled to occur on 09/29/18. Of note, course associated with biliary  obstruction, prompting biliary stent placement on 08/27/18.   Hypokalemia/hypomagnesemia Replace k and mag,  Improving, monitor  AKI  Likely prerenal Cr baseline 0.7, cr on presentation is 1.15 Cr improving this am Renal dosing meds  Anemia of chronic disease, no overt sign of bleeding Could have intermittent gi bleed due to h/o pancreatic cancer Monitor hgb  Thrombocytopenia Seems to be fluctuating from 2019, plt was in the 80's in 2019 He is not on heparin this hospitalization  b12/folate unremarkable plt 63 -79 since in the hospital ,Acute drop of plt, likely due to infection Monitor    Mild lower extremity edema, left > Right  venous doppler negative for DVT,  Recent echo done on 4/27 lvef wnl , "Left ventricular diastolic Doppler parameters are consistent  with impaired relaxation. 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 He is much weaker than his baseline due to recent procedures and recurrent hospitalization  Code Status: DNR  Family Communication: patient and daughter in law over the phone  Disposition Plan: needs GI clearance, likely home with home health in a few days   Consultants:  [facility]  Procedures:  none  Antibiotics:  As above   Objective: BP (!) 163/86 (BP Location: Left Arm)    Pulse 86    Temp 97.8 F (36.6 C) (Oral)    Resp 17    Ht 5\' 10"  (1.778 m)    Wt 57.4 kg    SpO2 98%    BMI 18.15 kg/m   Intake/Output Summary (Last 24 hours) at 09/18/2018 2304 Last data filed at 09/18/2018 2100 Gross per 24 hour  Intake 1080 ml  Output 2800 ml  Net  -1720 ml   Filed Weights   09/16/18 2140 09/17/18 0347 09/18/18 0228  Weight: 58.2 kg 59 kg 57.4 kg    Exam: Patient is examined daily including today on 09/18/2018, exams remain the same as of yesterday except that has changed    General:  Frail, hard of hearing ,NAD  Cardiovascular: RRR  Respiratory: CTABL  Abdomen: Soft/ND/NT, positive BS  Musculoskeletal: mild bilateral lower extremity edema, left > right  Neuro: alert, oriented   Data Reviewed: Basic Metabolic Panel: Recent Labs  Lab 09/13/18 0124 09/14/18 0446 09/16/18 1737 09/16/18 1815 09/16/18 2152 09/17/18 0400 09/18/18 0356  NA 135 135 135 137  --  138 138  K 4.2 4.3 3.5 3.3*  --  3.3* 3.6  CL 110 109 103  --   --  110 109  CO2 22 22 21*  --   --  20* 20*  GLUCOSE 117* 118* 235*  --   --  138* 106*  BUN 14 12 6*  --   --  6* 5*  CREATININE 0.70 0.66 1.15  --   --  0.93 0.80  CALCIUM 7.1* 7.5* 8.1*  --   --  7.5* 8.1*  MG  --   --   --   --  1.6*  1.5* 2.0   Liver Function Tests: Recent Labs  Lab 09/13/18 0124 09/14/18 0446 09/16/18 1737 09/17/18 0400 09/18/18 0356  AST 65* 33 108* 57* 38  ALT 65* 45* 62* 43 39  ALKPHOS 358* 355* 555* 380* 375*  BILITOT 8.9* 2.5* 3.3* 2.5* 1.8*  PROT 4.2* 4.4* 5.1* 4.0* 4.7*  ALBUMIN 2.0* 2.2* 2.5* 1.9* 2.2*   Recent Labs  Lab 09/16/18 1737  LIPASE 14   No results for input(s): AMMONIA in the last 168 hours. CBC: Recent Labs  Lab 09/13/18 0124 09/14/18 0446 09/16/18 1737 09/16/18 1815 09/17/18 0400 09/18/18 0356  WBC 6.0 4.3 4.1  --  4.2 4.4  NEUTROABS  --   --  3.4  --  3.0 2.6  HGB 9.6* 10.2* 10.9* 10.2* 8.8* 10.1*  HCT 28.1* 29.6* 32.5* 30.0* 26.0* 29.5*  MCV 98.3 99.0 100.3*  --  100.0 98.3  PLT 95* 90* 69*  --  63* 79*   Cardiac Enzymes:   No results for input(s): CKTOTAL, CKMB, CKMBINDEX, TROPONINI in the last 168 hours. BNP (last 3 results) No results for input(s): BNP in the last 8760 hours.  ProBNP (last 3 results) No results for  input(s): PROBNP in the last 8760 hours.  CBG: No results for input(s): GLUCAP in the last 168 hours.  Recent Results (from the past 240 hour(s))  Blood Culture (routine x 2)     Status: None   Collection Time: 09/11/18 10:10 PM  Result Value Ref Range Status   Specimen Description BLOOD LEFT ANTECUBITAL  Final   Special Requests   Final    BOTTLES DRAWN AEROBIC AND ANAEROBIC Blood Culture results may not be optimal due to an inadequate volume of blood received in culture bottles   Culture   Final    NO GROWTH 5 DAYS Performed at [facility], [address], [place], [place] [address]    Report Status 09/16/2018 FINAL  Final  Blood Culture (routine x 2)     Status: Abnormal   Collection Time: 09/11/18 10:10 PM  Result Value Ref Range Status   Specimen Description   Final    BLOOD RIGHT WRIST Performed at [facility], [address], [place], [place] [address]    Special Requests   Final    BOTTLES DRAWN AEROBIC AND ANAEROBIC Blood Culture results may not be optimal due to an inadequate volume of blood received in culture bottles Performed at [facility], [address] [place], [place] [address]    Culture  Setup Time   Final    Organism ID to follow [place] CRITICAL RESULT CALLED TO, READ BACK BY AND VERIFIED WITH: [facility] MARTIN AT 1236 09/12/2018.PMF GRAM STAIN REVIEWED-AGREE WITH RESULT T. TYSOR Performed at [facility], [place] [address], [place], [place] [address]    Culture KLEBSIELLA PNEUMONIAE (A)  Final   Report Status 09/14/2018 FINAL  Final   Organism ID, Bacteria KLEBSIELLA PNEUMONIAE  Final      Susceptibility   Klebsiella pneumoniae - MIC*    AMPICILLIN >=32 RESISTANT Resistant     CEFAZOLIN <=4 SENSITIVE Sensitive     CEFEPIME <=1 SENSITIVE Sensitive     CEFTAZIDIME <=1 SENSITIVE Sensitive     CEFTRIAXONE <=1 SENSITIVE Sensitive     CIPROFLOXACIN <=0.25 SENSITIVE Sensitive      GENTAMICIN <=1 SENSITIVE Sensitive     IMIPENEM <=0.25 SENSITIVE Sensitive     TRIMETH/SULFA <=20 SENSITIVE Sensitive  AMPICILLIN/SULBACTAM 16 INTERMEDIATE Intermediate     PIP/TAZO 16 SENSITIVE Sensitive     Extended ESBL NEGATIVE Sensitive     * KLEBSIELLA PNEUMONIAE  Urine culture     Status: None   Collection Time: 09/11/18 10:10 PM  Result Value Ref Range Status   Specimen Description   Final    URINE, RANDOM Performed at [facility], [address] [place], [place] [address]    Special Requests   Final    Normal Performed at [facility], [address], [place], [place] [address]    Culture   Final    NO GROWTH Performed at [facility], [place] [address] [place], [place] [address]    Report Status 09/13/2018 FINAL  Final  SARS Coronavirus 2 [facility] order, Performed in [place] hospital lab)     Status: None   Collection Time: 09/11/18 10:10 PM  Result Value Ref Range Status   SARS Coronavirus 2 NEGATIVE NEGATIVE Final    Comment: (NOTE) If result is NEGATIVE SARS-CoV-2 target nucleic acids are NOT DETECTED. The SARS-CoV-2 RNA is generally detectable in upper and lower  respiratory specimens during the acute phase of infection. The lowest  concentration of SARS-CoV-2 viral copies this assay can detect is 250  copies / mL. A negative result does not preclude SARS-CoV-2 infection  and should not be used as the sole basis for treatment or other  patient management decisions.  A negative result may occur with  improper specimen collection / handling, submission of specimen other  than nasopharyngeal swab, presence of viral mutation(s) within the  areas targeted by this assay, and inadequate number of viral copies  (<250 copies / mL). A negative result must be combined with clinical  observations, patient history, and epidemiological information. If result is POSITIVE SARS-CoV-2 target nucleic acids are DETECTED. The SARS-CoV-2 RNA is  generally detectable in upper and lower  respiratory specimens dur ing the acute phase of infection.  Positive  results are indicative of active infection with SARS-CoV-2.  Clinical  correlation with patient history and other diagnostic information is  necessary to determine patient infection status.  Positive results do  not rule out bacterial infection or co-infection with other viruses. If result is PRESUMPTIVE POSTIVE SARS-CoV-2 nucleic acids MAY BE PRESENT.   A presumptive positive result was obtained on the submitted specimen  and confirmed on repeat testing.  While 2019 novel coronavirus  (SARS-CoV-2) nucleic acids may be present in the submitted sample  additional confirmatory testing may be necessary for epidemiological  and / or clinical management purposes  to differentiate between  SARS-CoV-2 and other Sarbecovirus currently known to infect humans.  If clinically indicated additional testing with an alternate test  methodology [phone]) is advised. The SARS-CoV-2 RNA is generally  detectable in upper and lower respiratory sp ecimens during the acute  phase of infection. The expected result is Negative. Fact Sheet for Patients:  StrictlyIdeas.no Fact Sheet for Healthcare Providers: BankingDealers.co.za This test is not yet approved or cleared by the [place] FDA and has been authorized for detection and/or diagnosis of SARS-CoV-2 by FDA under an Emergency Use Authorization (EUA).  This EUA will remain in effect (meaning this test can be used) for the duration of the COVID-19 declaration under Section 564(b)(1) of the Act, 21 U.S.C. section 360bbb-3(b)(1), unless the authorization is terminated or revoked sooner. Performed at [facility], [address] [place], [place] [address]   Blood Culture ID Panel (Reflexed)  Status: Abnormal   Collection Time: 09/11/18 10:10 PM  Result Value Ref Range Status    Enterococcus species NOT DETECTED NOT DETECTED Final   Listeria monocytogenes NOT DETECTED NOT DETECTED Final   Staphylococcus species NOT DETECTED NOT DETECTED Final   Staphylococcus aureus (BCID) NOT DETECTED NOT DETECTED Final   Streptococcus species NOT DETECTED NOT DETECTED Final   Streptococcus agalactiae NOT DETECTED NOT DETECTED Final   Streptococcus pneumoniae NOT DETECTED NOT DETECTED Final   Streptococcus pyogenes NOT DETECTED NOT DETECTED Final   Acinetobacter baumannii NOT DETECTED NOT DETECTED Final   Enterobacteriaceae species DETECTED (A) NOT DETECTED Final    Comment: Enterobacteriaceae represent a large family of gram-negative bacteria, not a single organism. CRITICAL RESULT CALLED TO, READ BACK BY AND VERIFIED WITH: [facility] MARTIN AT 1236 09/12/2018.PMF    Enterobacter cloacae complex NOT DETECTED NOT DETECTED Final   Escherichia coli NOT DETECTED NOT DETECTED Final   Klebsiella oxytoca NOT DETECTED NOT DETECTED Final   Klebsiella pneumoniae DETECTED (A) NOT DETECTED Final    Comment: CRITICAL RESULT CALLED TO, READ BACK BY AND VERIFIED WITH: [facility] MARTIN AT 1236 09/12/2018.PMF    Proteus species NOT DETECTED NOT DETECTED Final   Serratia marcescens NOT DETECTED NOT DETECTED Final   Carbapenem resistance NOT DETECTED NOT DETECTED Final   Haemophilus influenzae NOT DETECTED NOT DETECTED Final   Neisseria meningitidis NOT DETECTED NOT DETECTED Final   Pseudomonas aeruginosa NOT DETECTED NOT DETECTED Final   Candida albicans NOT DETECTED NOT DETECTED Final   Candida glabrata NOT DETECTED NOT DETECTED Final   Candida krusei NOT DETECTED NOT DETECTED Final   Candida parapsilosis NOT DETECTED NOT DETECTED Final   Candida tropicalis NOT DETECTED NOT DETECTED Final    Comment: Performed at [facility], [address] [place], [place] [address]  Culture, blood (single) w Reflex to ID Panel     Status: None (Preliminary result)   Collection Time: 09/14/18   4:46 AM  Result Value Ref Range Status   Specimen Description BLOOD LEFT ARM  Final   Special Requests   Final    BOTTLES DRAWN AEROBIC AND ANAEROBIC Blood Culture adequate volume   Culture   Final    NO GROWTH 4 DAYS Performed at [facility], [address], [place], [place] [address]    Report Status PENDING  Incomplete  Blood Culture (routine x 2)     Status: None (Preliminary result)   Collection Time: 09/16/18  5:41 PM  Result Value Ref Range Status   Specimen Description BLOOD LEFT WRIST  Final   Special Requests   Final    BOTTLES DRAWN AEROBIC AND ANAEROBIC Blood Culture adequate volume   Culture   Final    NO GROWTH 2 DAYS Performed at [facility], [place] [address], [place], [place] [address]    Report Status PENDING  Incomplete  Blood Culture (routine x 2)     Status: None (Preliminary result)   Collection Time: 09/16/18  5:50 PM  Result Value Ref Range Status   Specimen Description BLOOD RIGHT WRIST  Final   Special Requests   Final    BOTTLES DRAWN AEROBIC AND ANAEROBIC Blood Culture results may not be optimal due to an inadequate volume of blood received in culture bottles   Culture   Final    NO GROWTH 2 DAYS Performed at [facility], [place] [address], [place], [place] [address]    Report Status PENDING  Incomplete  SARS  Coronavirus 2 [facility] order, Performed in [facility] hospital lab)     Status: None   Collection Time: 09/16/18  5:55 PM  Result Value Ref Range Status   SARS Coronavirus 2 NEGATIVE NEGATIVE Final    Comment: (NOTE) If result is NEGATIVE SARS-CoV-2 target nucleic acids are NOT DETECTED. The SARS-CoV-2 RNA is generally detectable in upper and lower  respiratory specimens during the acute phase of infection. The lowest  concentration of SARS-CoV-2 viral copies this assay can detect is 250  copies / mL. A negative result does not preclude SARS-CoV-2 infection  and should not be used as the sole basis for treatment or other   patient management decisions.  A negative result may occur with  improper specimen collection / handling, submission of specimen other  than nasopharyngeal swab, presence of viral mutation(s) within the  areas targeted by this assay, and inadequate number of viral copies  (<250 copies / mL). A negative result must be combined with clinical  observations, patient history, and epidemiological information. If result is POSITIVE SARS-CoV-2 target nucleic acids are DETECTED. The SARS-CoV-2 RNA is generally detectable in upper and lower  respiratory specimens dur ing the acute phase of infection.  Positive  results are indicative of active infection with SARS-CoV-2.  Clinical  correlation with patient history and other diagnostic information is  necessary to determine patient infection status.  Positive results do  not rule out bacterial infection or co-infection with other viruses. If result is PRESUMPTIVE POSTIVE SARS-CoV-2 nucleic acids MAY BE PRESENT.   A presumptive positive result was obtained on the submitted specimen  and confirmed on repeat testing.  While 2019 novel coronavirus  (SARS-CoV-2) nucleic acids may be present in the submitted sample  additional confirmatory testing may be necessary for epidemiological  and / or clinical management purposes  to differentiate between  SARS-CoV-2 and other Sarbecovirus currently known to infect humans.  If clinically indicated additional testing with an alternate test  methodology [phone]) is advised. The SARS-CoV-2 RNA is generally  detectable in upper and lower respiratory sp ecimens during the acute  phase of infection. The expected result is Negative. Fact Sheet for Patients:  StrictlyIdeas.no Fact Sheet for Healthcare Providers: BankingDealers.co.za This test is not yet approved or cleared by the [place] FDA and has been authorized for detection and/or diagnosis of SARS-CoV-2  by FDA under an Emergency Use Authorization (EUA).  This EUA will remain in effect (meaning this test can be used) for the duration of the COVID-19 declaration under Section 564(b)(1) of the Act, 21 U.S.C. section 360bbb-3(b)(1), unless the authorization is terminated or revoked sooner. Performed at [facility], [place] [address], [place], [place] [address]   Urine culture     Status: None   Collection Time: 09/16/18  6:12 PM  Result Value Ref Range Status   Specimen Description URINE, RANDOM  Final   Special Requests NONE  Final   Culture   Final    NO GROWTH Performed at [facility], [place] [address] [place], [place] [address]    Report Status 09/17/2018 FINAL  Final  MRSA PCR Screening     Status: None   Collection Time: 09/16/18 11:05 PM  Result Value Ref Range Status   MRSA by PCR NEGATIVE NEGATIVE Final    Comment:        The GeneXpert MRSA Assay (FDA approved for NASAL specimens only), is one component of a comprehensive MRSA colonization surveillance program. It is not  intended to diagnose MRSA infection nor to guide or monitor treatment for MRSA infections. Performed at [facility], [place] [address] [place], [place] [address]      Studies: Vas [place] Lower Extremity Venous (dvt)  Result Date: 09/18/2018  Lower Venous Study Indications: Edema.  Limitations: Patient movement. Performing Technologist: Abram [place] RVS  Examination Guidelines: A complete evaluation includes B-mode imaging, spectral Doppler, color Doppler, and power Doppler as needed of all accessible portions of each vessel. Bilateral testing is considered an integral part of a complete examination. Limited examinations for reoccurring indications may be performed as noted.  +---------+---------------+---------+-----------+----------+--------------+  RIGHT     Compressibility Phasicity Spontaneity Properties Summary          +---------+---------------+---------+-----------+----------+--------------+  CFV       Full            Yes       Yes                                    +---------+---------------+---------+-----------+----------+--------------+  SFJ       Full                                                             +---------+---------------+---------+-----------+----------+--------------+  FV Prox   Full                                                             +---------+---------------+---------+-----------+----------+--------------+  FV Mid    Full                                                             +---------+---------------+---------+-----------+----------+--------------+  FV Distal Full                                                             +---------+---------------+---------+-----------+----------+--------------+  PFV       Full                                                             +---------+---------------+---------+-----------+----------+--------------+  POP       Full            Yes       Yes                                    +---------+---------------+---------+-----------+----------+--------------+  PTV  Full                                                             +---------+---------------+---------+-----------+----------+--------------+  PERO                                                       Not visualized  +---------+---------------+---------+-----------+----------+--------------+   +---------+---------------+---------+-----------+----------+--------------+  LEFT      Compressibility Phasicity Spontaneity Properties Summary         +---------+---------------+---------+-----------+----------+--------------+  CFV       Full            Yes       Yes                                    +---------+---------------+---------+-----------+----------+--------------+  SFJ       Full                                                              +---------+---------------+---------+-----------+----------+--------------+  FV Prox   Full                                                             +---------+---------------+---------+-----------+----------+--------------+  FV Mid    Full                                                             +---------+---------------+---------+-----------+----------+--------------+  FV Distal Full                                                             +---------+---------------+---------+-----------+----------+--------------+  PFV       Full                                                             +---------+---------------+---------+-----------+----------+--------------+  POP       Full            Yes       Yes                                    +---------+---------------+---------+-----------+----------+--------------+  PTV       Full                                                             +---------+---------------+---------+-----------+----------+--------------+  PERO                                                       Not visualized  +---------+---------------+---------+-----------+----------+--------------+     Summary: Right: There is no evidence of deep vein thrombosis in the lower extremity. No cystic structure found in the popliteal fossa. Left: There is no evidence of deep vein thrombosis in the lower extremity. No cystic structure found in the popliteal fossa.  *See table(s) above for measurements and observations. Electronically signed by Curt Jews MD on 09/18/2018 at 12:00:20 PM.    Final     Scheduled Meds:  ciprofloxacin  500 mg Oral BID   feeding supplement (ENSURE ENLIVE)  237 mL Oral BID BM   ferrous sulfate  325 mg Oral Q breakfast   metoprolol tartrate  12.5 mg Oral BID   metroNIDAZOLE  500 mg Oral Q8H   multivitamin with minerals  1 tablet Oral Daily   pantoprazole  40 mg Oral Daily   pramipexole  0.125 mg Oral QHS    Continuous Infusions:  sodium chloride        Time spent: 35mins I have personally reviewed and interpreted on  09/18/2018 daily labs, tele strips, imagings as discussed above under date review session and assessment and plans.  I reviewed all nursing notes, pharmacy notes, consultant notes,  vitals, pertinent old records  I have discussed plan of care as described above with RN , patient  on 09/18/2018   Florencia Reasons MD, PhD  [facility] Pager [phone]. If 7PM-7AM, please contact night-coverage at [email or web address], password [facility] 09/18/2018, 11:04 PM  LOS: 2 days

## 2018-09-19 DIAGNOSIS — K8309 Other cholangitis: Secondary | ICD-10-CM

## 2018-09-19 LAB — COMPREHENSIVE METABOLIC PANEL
ALT: 35 U/L (ref 0–44)
AST: 30 U/L (ref 15–41)
Albumin: 2.3 g/dL — ABNORMAL LOW (ref 3.5–5.0)
Alkaline Phosphatase: 353 U/L — ABNORMAL HIGH (ref 38–126)
Anion gap: 7 (ref 5–15)
BUN: 6 mg/dL — ABNORMAL LOW (ref 8–23)
CO2: 24 mmol/L (ref 22–32)
Calcium: 8.3 mg/dL — ABNORMAL LOW (ref 8.9–10.3)
Chloride: 106 mmol/L (ref 98–111)
Creatinine, Ser: 0.84 mg/dL (ref 0.61–1.24)
GFR calc Af Amer: >60 mL/min (ref >60)
GFR calc non Af Amer: >60 mL/min (ref >60)
Glucose, Bld: 96 mg/dL (ref 70–99)
Potassium: 3.6 mmol/L (ref 3.5–5.1)
Sodium: 137 mmol/L (ref 135–145)
Total Bilirubin: 2 mg/dL — ABNORMAL HIGH (ref 0.3–1.2)
Total Protein: 4.5 g/dL — ABNORMAL LOW (ref 6.5–8.1)

## 2018-09-19 LAB — MAGNESIUM: Magnesium: 1.9 mg/dL (ref 1.7–2.4)

## 2018-09-19 LAB — CULTURE, BLOOD (SINGLE)
Culture: NO GROWTH
Special Requests: ADEQUATE

## 2018-09-19 LAB — CBC
HCT: 28.9 % — ABNORMAL LOW (ref 39.0–52.0)
Hemoglobin: 9.8 g/dL — ABNORMAL LOW (ref 13.0–17.0)
MCH: 33.3 pg (ref 26.0–34.0)
MCHC: 33.9 g/dL (ref 30.0–36.0)
MCV: 98.3 fL (ref 80.0–100.0)
Platelets: 86 10*3/uL — ABNORMAL LOW (ref 150–400)
RBC: 2.94 MIL/uL — ABNORMAL LOW (ref 4.22–5.81)
RDW: 20.4 % — ABNORMAL HIGH (ref 11.5–15.5)
WBC: 3.9 10*3/uL — ABNORMAL LOW (ref 4.0–10.5)
nRBC: 0 % (ref 0.0–0.2)

## 2018-09-19 NOTE — Progress Notes (Signed)
Physical Therapy Treatment Patient Details Name: Jose [name] MRN: [id number] DOB: Oct 23, 1923 Today's Date: 09/19/2018    History of Present Illness 85 yo admitted with fever and severe sepsis from [facility] PNA. PMHx: HTN, dyspnea, HOH, pancreatic CA with bile duct obstruction s/p biliary stent.    PT Comments    Pt is hopeful for d/c home today. Pt request using bathroom before ambulation. Pt limited in safe mobility by decreased knowledge of deficits and decreased strength and endurance. Pt is mod I for bed mobility, minA for power up from low toilet and min guard for ambulation of 100 feet in his room with one seated rest break. D/c plans remain appropriate at this time. PT will continue to follow acutely to maintain strength if he does not d/c today.    Follow Up Recommendations  Supervision/Assistance - 24 hour;Supervision for mobility/OOB;Home health PT     Equipment Recommendations  None recommended by PT       Precautions / Restrictions Precautions Precautions: Fall Precaution Comments: impulsive with movement Restrictions Weight Bearing Restrictions: No    Mobility  Bed Mobility Overal bed mobility: Modified Independent             General bed mobility comments: use of bedrail to pull to EoB  Transfers Overall transfer level: Needs assistance Equipment used: Rolling walker (2 wheeled) Transfers: Sit to/from Stand Sit to Stand: Min guard;Min assist         General transfer comment: Min guard for safety from bed, minA from low toilet.    Ambulation/Gait Ambulation/Gait assistance: Min guard Gait Distance (Feet): 100 Feet Assistive device: Rolling walker (2 wheeled) Gait Pattern/deviations: Step-through pattern;Decreased step length - right;Decreased step length - left;Trunk flexed;Shuffle Gait velocity: slowed Gait velocity interpretation: <1.31 ft/sec, indicative of household ambulator General Gait Details: hands on min guard for safety, mild  instability with no overt LoB, multiple turns performed as gait was performed in his room.  requires 1x seated rest break due to feeling "swimmy headed." dissipates quickly and completes additional circuit of room and returns to bed       Balance Overall balance assessment: History of Falls;Needs assistance Sitting-balance support: No upper extremity supported;Feet supported Sitting balance-Leahy Scale: Fair       Standing balance-Leahy Scale: Poor                              Cognition Arousal/Alertness: Awake/alert Behavior During Therapy: Impulsive Overall Cognitive Status: Within Functional Limits for tasks assessed                                        Exercises  Declines today due to increased LE pain with last session therapeutic exercise    General Comments General comments (skin integrity, edema, etc.): Pt continues to be cold however only requires 5 blankets of end of session today      Pertinent Vitals/Pain Pain Assessment: No/denies pain           PT Goals (current goals can now be found in the care plan section) Acute Rehab PT Goals Patient Stated Goal: go home and get warm PT Goal Formulation: With patient Time For Goal Achievement: 10/01/18 Potential to Achieve Goals: Good Progress towards PT goals: Progressing toward goals    Frequency    Min 3X/week      PT Plan Current plan  remains appropriate       AM-PAC PT "6 Clicks" Mobility   Outcome Measure  Help needed turning from your back to your side while in a flat bed without using bedrails?: A Little Help needed moving from lying on your back to sitting on the side of a flat bed without using bedrails?: A Little Help needed moving to and from a bed to a chair (including a wheelchair)?: A Little Help needed standing up from a chair using your arms (e.g., wheelchair or bedside chair)?: A Little Help needed to walk in hospital room?: A Little Help needed climbing  3-5 steps with a railing? : A Lot 6 Click Score: 17    End of Session Equipment Utilized During Treatment: Gait belt Activity Tolerance: Patient limited by fatigue Patient left: in bed;with bed alarm set;with call bell/phone within reach Nurse Communication: Mobility status PT Visit Diagnosis: Unsteadiness on feet (R26.81);Other abnormalities of gait and mobility (R26.89);Difficulty in walking, not elsewhere classified (R26.2)     Time: 0092-3300 PT Time Calculation (min) (ACUTE ONLY): 37 min  Charges:  $Gait Training: 8-22 mins $Therapeutic Activity: 8-22 mins                     Elloise Roark B. Migdalia Dk PT, DPT Acute Rehabilitation Services Pager [phone] Office [phone]    [place] 09/19/2018, 11:40 AM

## 2018-09-19 NOTE — Discharge Summary (Signed)
Discharge Summary  Jose [name] GBT:[id number] DOB: June 07, 1923  PCP: [name], MD  Admit date: 09/16/2018 Discharge date: 09/19/2018  Time spent: 71mns, more than 50% time spent on coordination of care.  Recommendations for Outpatient Follow-up:  1. F/u with PCP within a week  for hospital discharge follow up, repeat cbc/bmp at follow up 2. F/u with GI Dr MWatt Climesto repeat ERCP on 5/5 3. F/u with oncology    Discharge Diagnoses:  Active Hospital Problems   Diagnosis Date Noted   HCAP (healthcare-associated pneumonia) 09/16/2018   Hypokalemia    Malignant neoplasm of other parts of pancreas ([facility]    Thrombocytopenia ([facility]    Severe sepsis ([facility] 09/16/2018   Fever 09/16/2018   AK[place] (acute kidney injury) ([place] 09/16/2018   Elevated lactic acid level 09/16/2018   BPH (benign prostatic hyperplasia) 04/23/2018   Anemia of chronic disease 05/11/2015   Essential hypertension, benign 09/17/2006   GERD 09/17/2006    Resolved Hospital Problems  No resolved problems to display.    Discharge Condition: stable  Diet recommendation: soft diet  Filed Weights   09/17/18 0347 09/18/18 0228 09/19/18 0100  Weight: 59 kg 57.4 kg 56.2 kg    History of present illness: (per admitting MD Dr HVelia Meyer PCP: LVenia Carbon MD Patient coming from: home  I [place] Chief Complaint: fever   HPI: Jose Finniganis a 85y.o. male with medical history significant for pancreatic adenocarcinoma complicated by common bile duct obstruction status post biliary stent on 08/27/2018, hypertension, chronic iron deficiency anemia, [facility] with severe sepsis due to suspected left lower lobe pneumonia [facility]  The following history is obtained via my  discussions with the patient as well as my discussions with the patient's daughter-in-law, who is present at bedside, in addition to my discussions with the emergency department physician and via chart review.  [facility] after presenting with objective fever.  Per review of discharge summary, source of underlying infection was undetermined, however blood cultures were positive for Enterobacter and Klebsiella for which the patient was initially started on IV vancomycin and meropenem before reported discontinuation of IV vancomycin on 09/12/2018.  He was found to be COVID-19 negative, influenza a and B negative, urinalysis was not suggestive of urinary tract infection, and chest x-ray reportedly demonstrated no evidence of underlying pneumonia. the patient was continued on meropenem for 48 hours following resolution of his objective fever, before being transitioned to ciprofloxacin and Flagyl at the time of discharge, with instructions to complete 10 additional days of these oral antibiotics per infectious disease consult. Patient's case was discussed with GI regarding elevated liver enzymes during the [place] hospitalization, and reportedly felt that there was no indication for ERCP given that bilirubin was stable, and trending down slightly over hospital course. He follows with Dr. MWatt Climesas his outpatient gastroenterologist. On 09/14/2018, the patient was discharged to home, where he lives independently, with home health PT.   Over the course of 09/15/2018, the patient reports feeling slightly weak in a general sense, but otherwise asymptomatic, including no subjective fever, chills, rigors, or generalized myalgias.  He qualifies this by reporting that he felt well enough yesterday to mow his lawn via rSocial research officer, government  However, starting  this morning, the patient reports development of subjective fever.  At the time of his  home health physical therapy appointment, PT provider found patient to exhibit fever of 104 by oral temperature, prompting the patient to be brought to [facility] emergency department for further evaluation.  The patient denies any headache, neck stiffness, rhinitis, rhinorrhea, sore throat, or dysuria.  He reports mild nonproductive cough as well as mild shortness of breath, in the absence of any associated orthopnea, PND, or peripheral edema.  He denies any associated chest pain, palpitations, diaphoresis, or dizziness.  denies any associated nausea, vomiting, diarrhea, or abdominal pain.    Notable medical history includes recent diagnosis of pancreatic adenocarcinoma, which appears to have been made during the first week of April. In setting of CBD obstruction, he underwent placement of biliary stent on 08/27/18. Outpatient oncologist is Dr. Betsy Coder, whom patient most recently saw on 08/26/18. Per review of his documentation from that appointment, Dr. Benay Spice recommended refraining from chemotherapy, but rather employment of observation with 4 week follow-up due to concerns regarding patient's ability to tolerate chemotherapy given his advanced age. Next follow-up appointment with Dr. Benay Spice scheduled for 09/29/18.    ED Course: VS in ED notable for Tmax 102.9; initial HR 130, which decreased to 107 following interval IVF's, as described below; BP 118/83 - 148/72; RR 20-36; O2 95-100% on RA.   Labs in ED were notable for the following: CMP notable for Na 135, bicarbonate 21, AG 11, BUN 6, Cr 1.15 relative to 0.66 on 09/14/18, alk phos 555 relative to range during recent [place] hospitalization of 355-401, AST 108 relative to range during recent [place] hospitalization of 33-228, ALT 62 relative to range during recent [place] hospitalization of 45-91, T bili 3.3 relative to range during recent [place] hospitalization of 2.2-8.9. CBC showed wbc of 4,100 with 82% neutrophis, Hgb 10.9 compared  to 10.2 on 09/14/18, and platelets 69 relative to 90 on 09/14/18. LA 3.2; UA showed no wbc's. Blood cx's x 2 collected prior to initiation of any abx. In-house COVID-19 test found to be negative.   CXR, per final radiology report showed interval left basilar airspace opacity concerning for infiltrate and possible small left pleural effusion in absence of any evidence of edema. CT abd/pelvis with IV contrast this evening, per final radiology report and in comparison to CT abd/pelvis on 08/18/18, showed interval placement of common bile duct stent, with slight increase in intrahepatic/proximal extrahepatic duct dilation, and new small volume of ascites.   ED physician discussed susceptibilities of repeat blood cx's positive for Enterobacter and Klebsiella, confirming susceptibility of both to Meropenem.   While still in the ED, the following were administered: Meropenem 2 g IV x 1, Vancomycin 1 g IV x 1, NS bolus x 2 L, and acetaminophen 650 mg PO x 1.   Subsequently, the patient was admitted to med-tele for further evaluation and management of presenting severe sepsis to suspected LLL pna.   Hospital Course:  Principal Problem:   HCAP (healthcare-associated pneumonia) Active Problems:   Essential hypertension, benign   GERD   Anemia of chronic disease   BPH (benign prostatic hyperplasia)   Severe sepsis (HCC)   Fever   AKI (acute kidney injury) (HCC)   Elevated lactic acid level   Malignant neoplasm of other parts of pancreas (HCC)   Thrombocytopenia (HCC)   Hypokalemia   Sepsis on presentation with fever, lactic acidosis, tachycardia,  Tachypnea With abnormal LFT, biliary duct dilatation on CT ,  -  He does not appear to have pneumonia clinically, no cough, lung clear on exam -He is started on vanc and meropenem on admission,  -Blood culture no growth, mrsa screening negative, repeat covid testing negative, ua unremarkable -Recent CBD stent, suspect source of infection is  intraabdominal, continue meropenem, d/c vanc,  -home meds betablocker held on admission due to borderline bp, now bp is improving, resumed on 4/30 pm [facility] consulted, patient is improving, no pain , no fever, tolerating diet, he wants to go home , he is cleared to d/c home on oral abx and return to GI on Tuesday to repeat ERCP.  case discussed with GI Dr Alessandra Bevels over the phone, Dr Alessandra Bevels will alert office regarding outpatient ERCP on 5/5.  history of Enterobacter/Klebsiella bacteremia: per blood cultures drawn on day [facility] on 09/11/18. As described above, source not completely clear, although biliary is a possibility. Received 4 days of Meropenem before being discharged to home on 09/14/18 on 10 additional days of Cipro/Flagyl in order to complete a total antibiotic course of 14 days    Pancreatic Adenocarcinoma: appears to have been diagnosed with such in early April 2020, and follows with Dr. Benay Spice as outpatient oncologist, who suspects stage 4 disease in setting of right lung nodule. Per review of documentation from outpatient oncology appointment on 08/26/18,Dr. Sherrill recommended refraining from chemotherapy, but ratherrecommended observations with plan for 4 week follow-up given his concerns for patient's ability to tolerate chemotherapy given his advanced age. Oncology follow-up appt scheduled to occur on 09/29/18.Of note, course associated with biliary obstruction, prompting biliary stent placement on 08/27/18.   Hypokalemia/hypomagnesemia Replaced and improved  AKI  Likely prerenal Cr baseline 0.7, cr on presentation is 1.15 Cr improved, cr 0.8 on discharge Renal dosing meds  Anemia of chronic disease, no overt sign of bleeding Could have intermittent gi bleed due to h/o pancreatic cancer Monitor hgb, hgb 9.8 at discharge  Thrombocytopenia Seems to be fluctuating from 2019, plt was in the 80's in 2019 He is not on heparin this  hospitalization  b12/folate unremarkable plt 63 -79-86 since in the hospital ,Acute drop of plt, likely due to infection Monitor    Mild lower extremity edema, left > Right  -venous doppler negative for DVT,  -Recent echo done on 4/27 lvef wnl , "Left ventricular diastolic Doppler parameters are consistent  with impaired relaxation. 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 He is much weaker than his baseline due to recent procedures and recurrent hospitalizations  Code Status: DNR  Family Communication: patient and daughter in law over the phone daily  Disposition Plan:  home with home health    Consultants:  [facility]  Procedures:  none  Antibiotics:  As above   Discharge Exam: BP (!) 142/75 (BP Location: Right Arm)    Pulse 87    Temp 97.8 F (36.6 C) (Oral)    Resp 18    Ht '5\' 10"'$  (1.778 m)    Wt 56.2 kg    SpO2 99%    BMI 17.78 kg/m   General: NAD, AAOx3 Cardiovascular:  RRR Respiratory: CTABL  Discharge Instructions You were cared for by a hospitalist during your hospital stay. If you have any questions about your discharge medications or the care you received while you were in the hospital after you are discharged, you can call the unit and asked to speak with the hospitalist on call if the hospitalist that took care of you is not available. Once you are discharged, your primary care physician will handle any further medical issues. Please note that NO REFILLS for any discharge medications will be authorized once you are discharged, as it is imperative  that you return to your primary care physician (or establish a relationship with a primary care physician if you do not have one) for your aftercare needs so that they can reassess your need for medications and monitor your lab values.  Discharge Instructions    Diet general   Complete by:  As directed    Low fat diet   Increase activity slowly   Complete by:  As directed    Increase activity slowly   Complete by:  As directed      Allergies as of 09/19/2018   No Known Allergies     Medication List    STOP taking these medications   traMADol 50 MG tablet Commonly known as:  ULTRAM     TAKE these medications   acetaminophen 325 MG tablet Commonly known as:  TYLENOL Take 650 mg by mouth every 6 (six) hours as needed for mild pain.   Align 4 MG Caps Take 4 mg by mouth daily.   ciprofloxacin 500 MG tablet Commonly known as:  Cipro Take 1 tablet (500 mg total) by mouth 2 (two) times daily for 10 days.   feeding supplement (ENSURE ENLIVE) Liqd Take 6 mLs by mouth 2 (two) times daily between meals. What changed:    how much to take  when to take this   ferrous sulfate 325 (65 FE) MG EC tablet Take 325 mg by mouth daily with breakfast.   gabapentin 600 MG tablet Commonly known as:  NEURONTIN Take 1 tablet (600 mg total) by mouth every morning. And 2 at bedtime What changed:    when to take this  additional instructions   metoprolol succinate 25 MG 24 hr tablet Commonly known as:  TOPROL-XL TAKE ONE TABLET BY MOUTH DAILY   metroNIDAZOLE 500 MG tablet Commonly known as:  Flagyl Take 1 tablet (500 mg total) by mouth 3 (three) times daily for 10 days.   multivitamin with minerals Tabs tablet Take 1 tablet by mouth daily.   omeprazole 20 MG capsule Commonly known as:  PRILOSEC TAKE ONE CAPSULE BY MOUTH DAILY   pramipexole 0.125 MG tablet Commonly known as:  MIRAPEX Take 1 tablet (0.125 mg total) by mouth at bedtime as needed. What changed:  reasons to take  this   tamsulosin 0.4 MG Caps capsule Commonly known as:  FLOMAX Take 1 capsule (0.4 mg total) by mouth daily.   vitamin B-12 500 MCG tablet Commonly known as:  CYANOCOBALAMIN Take 500 mcg by mouth daily.      No Known Allergies Follow-up Information    [facility].   Specialty:  [place] Why:  A home health care nurse and physical therapist will go to your home       Clarene Essex, MD Follow up on 09/22/2018.   Specialty:  Gastroenterology Why:  please donot eat or drink monday aftermidnight,  to repeat  ERCP on 5/5 please call Dr Perley Jain office on monday to comfirm timing and location of ERCP on 5/5. Contact information: [address] [place] [address] [phone]        [name], MD Follow up.   Specialties:  Internal Medicine, Pediatrics Why:  hospital discharge follow up Contact information: [place] [address] [phone]            The results of significant diagnostics from this hospitalization (including imaging, microbiology, ancillary and laboratory) are listed below for reference.    Significant Diagnostic Studies: Ct Angio Chest Pe W Or Wo Contrast  Result Date: 09/12/2018 CLINICAL DATA:  Fever with hypoxia. History of pancreatic adenocarcinoma. EXAM: CT ANGIOGRAPHY CHEST WITH CONTRAST TECHNIQUE: Multidetector CT imaging of the chest was performed using the standard protocol during bolus administration of intravenous contrast. Multiplanar CT image reconstructions and MIPs were obtained to evaluate the vascular anatomy. CONTRAST:  21m OMNIPAQUE IOHEXOL 350 MG/ML SOLN COMPARISON:  None. FINDINGS: Cardiovascular: --Pulmonary arteries: Contrast injection is sufficient to demonstrate satisfactory opacification of the pulmonary arteries to the segmental level, with attenuation of 400 HU at the main pulmonary artery. There is no pulmonary embolus. The main pulmonary  artery is within normal limits for size. --Aorta: Satisfactory opacification of the thoracic aorta. No aortic dissection or other acute aortic syndrome. Conventional 3 vessel aortic branching pattern. The aortic course and caliber are normal. There is mild aortic atherosclerosis. --Heart: Normal size. No pericardial effusion. Mediastinum/Nodes: No mediastinal, hilar or axillary lymphadenopathy. The visualized thyroid and thoracic esophageal course are unremarkable. Lungs/Pleura: Small pleural effusions with atelectasis. There are multiple small pulmonary nodules, measuring up to 6 mm. Upper Abdomen: Contrast bolus timing is not optimized for evaluation of the abdominal organs. There is pneumobilia with a metallic stents in the common bile duct. Limited visualization of known pancreatic adenocarcinoma. Musculoskeletal: No chest wall abnormality. No acute or significant osseous findings. Review of the MIP images confirms the above findings. IMPRESSION: 1. No pulmonary embolus or acute aortic syndrome. 2.  Aortic atherosclerosis (ICD10-I70.0). 3. Pneumobilia with metallic stent in the common bile duct. Electronically Signed   By: KUlyses JarredM.D.   On: 09/12/2018 03:45   Ct Abdomen Pelvis W Contrast  Result Date: 09/16/2018 CLINICAL DATA:  Pancreatic cancer with stent in place. Now with sepsis. On antibiotics. Altered mental status. Fever. EXAM: CT ABDOMEN AND PELVIS WITH CONTRAST TECHNIQUE: Multidetector CT imaging of the abdomen and pelvis was performed using the standard protocol following bolus administration of intravenous contrast. CONTRAST:  1019mOMNIPAQUE IOHEXOL 300 MG/ML  SOLN COMPARISON:  09/12/2018 right upper quadrant ultrasound. Most recent CT of 08/18/2018. FINDINGS: Lower chest: Increased left and new right base dependent opacity, most consistent with atelectasis. This likely obscures the previously described right lung base pulmonary nodule. There are right lung base nodules on the order of 2 mm  which are felt to be similar to on the prior. Small bilateral pleural effusions, increased. Hepatobiliary: Since 08/18/2018, placement of a common duct stent. This originates in the porta hepatis and terminates in the descending duodenum. There is minimal pneumobilia. Increased intrahepatic duct dilatation, with the right hepatic duct measuring 8 mm on image 19/3 versus 6 mm on 08/18/2018. The common duct measures 15 mm just proximal to the stent versus 10 mm at this level on 08/18/2018 CT. Pancreas: Pancreatic atrophy and upstream duct dilatation. This continues to the level of a pancreatic head/uncinate process mass which measures  on the order of 4.3 x 2.4 cm on image 31/3. Compare 3.5 x 2.1 cm on the prior. Redemonstration of SMA involvement with tumor on image 31/3. No superimposed pancreatitis. Spleen: Normal in size, without focal abnormality. Adrenals/Urinary Tract: Normal adrenal glands. Bilateral too small to characterize renal lesions. Left renal cysts. Left-sided bladder diverticulum. Stomach/Bowel: The stomach is underdistended. Apparent wall thickening, including proximally on image 18/3, is at least partially felt to be secondary. Extensive colonic diverticulosis. Normal terminal ileum. Normal small bowel. Vascular/Lymphatic: Aortic and branch vessel atherosclerosis. SMA involvement with tumor as detailed above. There is also contact with this SMV. 1.4 cm portacaval node is newly enlarged since the prior, including on image 26/3. No pelvic sidewall adenopathy. Reproductive: Mild prostatomegaly. Other: Tiny fat containing left inguinal hernia. Development of small volume abdominopelvic ascites. Musculoskeletal: No acute osseous abnormality. Lumbosacral spondylosis. IMPRESSION: 1. Since 08/18/2018, mild interval enlargement of the pancreatic head/uncinate process mass. 2. Interval placement of a common duct stent. Slight increase in mild to moderate intrahepatic and proximal extrahepatic duct dilatation.  Correlate with stent function. 3. Developing portacaval adenopathy, favored to represent metastatic disease. 4. New small volume abdominopelvic ascites. 5. Increase in small bilateral pleural effusions with developing bibasilar atelectasis. Right base pulmonary nodules as detailed previously. 6. Gastric underdistention. Apparent wall thickening is likely secondary. Gastritis cannot be excluded. Electronically Signed   By: Abigail Miyamoto M.D.   On: 09/16/2018 20:47   Dg Chest Port 1 View  Result Date: 09/16/2018 CLINICAL DATA:  85 year old male with shortness of breath and fever EXAM: PORTABLE CHEST 1 VIEW COMPARISON:  09/11/2018 FINDINGS: Cardiomediastinal silhouette unchanged in size and contour. No evidence of central vascular congestion. No pneumothorax. Low lung volumes with coarsened interstitial markings and no new confluent airspace disease. Left basilar opacity with blunting of the costophrenic angle. IMPRESSION: Low lung volumes with left basilar opacity, potentially small pleural fluid and associated atelectasis/consolidation Electronically Signed   By: Corrie Mckusick D.O.   On: 09/16/2018 18:28   Dg Chest Port 1 View  Result Date: 09/11/2018 CLINICAL DATA:  Shortness of breath EXAM: PORTABLE CHEST 1 VIEW COMPARISON:  04/26/2018 FINDINGS: Bibasilar atelectasis. No pleural effusion or pneumothorax. Calcific aortic atherosclerosis. No focal consolidation. IMPRESSION: Bibasilar atelectasis without acute airspace disease. Electronically Signed   By: Ulyses Jarred M.D.   On: 09/11/2018 22:50   Dg Ercp  Result Date: 08/27/2018 CLINICAL DATA:  Pancreatic mass and obstruction of the common bile duct. EXAM: ERCP TECHNIQUE: Multiple spot images obtained with the fluoroscopic device and submitted for interpretation post-procedure. COMPARISON:  CT of the abdomen on 08/18/2018 FINDINGS: Imaging obtained with a C-arm demonstrates cannulation of the common bile duct with contrast injection demonstrating  high-grade stricture of the distal common bile duct. A metallic self expanding stent was placed across the level of biliary stricture with good positioning noted. 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 These images were submitted for radiologic interpretation only. Please see the procedural report for the amount of contrast and the fluoroscopy time utilized. Electronically Signed   By: Aletta Edouard M.D.   On: 08/27/2018 13:50   Vas [place] Lower Extremity Venous (dvt)  Result Date: 09/18/2018  Lower Venous Study Indications: Edema.  Limitations: Patient movement. Performing Technologist: Abram Sander RVS  Examination Guidelines: A complete evaluation includes B-mode imaging, spectral Doppler, color Doppler, and power Doppler as needed of all accessible portions of each vessel. Bilateral testing is  considered an integral part of a complete examination. Limited examinations for reoccurring indications may be performed as noted.  +---------+---------------+---------+-----------+----------+--------------+  RIGHT     Compressibility Phasicity Spontaneity Properties Summary         +---------+---------------+---------+-----------+----------+--------------+  CFV       Full            Yes       Yes                                    +---------+---------------+---------+-----------+----------+--------------+  SFJ       Full                                                             +---------+---------------+---------+-----------+----------+--------------+  FV Prox   Full                                                             +---------+---------------+---------+-----------+----------+--------------+  FV Mid    Full                                                             +---------+---------------+---------+-----------+----------+--------------+  FV Distal Full                                                              +---------+---------------+---------+-----------+----------+--------------+  PFV       Full                                                             +---------+---------------+---------+-----------+----------+--------------+  POP       Full            Yes       Yes                                    +---------+---------------+---------+-----------+----------+--------------+  PTV       Full                                                             +---------+---------------+---------+-----------+----------+--------------+  PERO  Not visualized  +---------+---------------+---------+-----------+----------+--------------+   +---------+---------------+---------+-----------+----------+--------------+  LEFT      Compressibility Phasicity Spontaneity Properties Summary         +---------+---------------+---------+-----------+----------+--------------+  CFV       Full            Yes       Yes                                    +---------+---------------+---------+-----------+----------+--------------+  SFJ       Full                                                             +---------+---------------+---------+-----------+----------+--------------+  FV Prox   Full                                                             +---------+---------------+---------+-----------+----------+--------------+  FV Mid    Full                                                             +---------+---------------+---------+-----------+----------+--------------+  FV Distal Full                                                             +---------+---------------+---------+-----------+----------+--------------+  PFV       Full                                                             +---------+---------------+---------+-----------+----------+--------------+  POP       Full            Yes       Yes                                     +---------+---------------+---------+-----------+----------+--------------+  PTV       Full                                                             +---------+---------------+---------+-----------+----------+--------------+  PERO  Not visualized  +---------+---------------+---------+-----------+----------+--------------+     Summary: Right: There is no evidence of deep vein thrombosis in the lower extremity. No cystic structure found in the popliteal fossa. Left: There is no evidence of deep vein thrombosis in the lower extremity. No cystic structure found in the popliteal fossa.  *See table(s) above for measurements and observations. Electronically signed by Curt Jews MD on 09/18/2018 at 12:00:20 PM.    Final    US Abdomen Limited Ruq  Result Date: 09/12/2018 CLINICAL DATA:  85 year old male with history of elevated liver function tests. Status post ERCP with placement of biliary stent on 08/27/2018. EXAM: ULTRASOUND ABDOMEN LIMITED RIGHT UPPER QUADRANT COMPARISON:  Abdominal ultrasound 03/12/2018. FINDINGS: Gallbladder: Status post cholecystectomy. Common bile duct: Diameter: 8.2 mm Liver: Mild diffuse heterogeneous echotexture. No focal lesion identified. Within normal limits in parenchymal echogenicity. Portal vein is patent on color Doppler imaging with normal direction of blood flow towards the liver. IMPRESSION: 1. Mild diffuse heterogeneous echotexture throughout the hepatic parenchyma, nonspecific. 2. No common bile duct dilatation to suggest biliary tract obstruction. Electronically Signed   By: Vinnie Langton M.D.   On: 09/12/2018 10:18    Microbiology: Recent Results (from the past 240 hour(s))  Blood Culture (routine x 2)     Status: None   Collection Time: 09/11/18 10:10 PM  Result Value Ref Range Status   Specimen Description BLOOD LEFT ANTECUBITAL  Final   Special Requests   Final    BOTTLES DRAWN AEROBIC AND ANAEROBIC Blood  Culture results may not be optimal due to an inadequate volume of blood received in culture bottles   Culture   Final    NO GROWTH 5 DAYS Performed at [facility], [address] [place], [place] [address]    Report Status 09/16/2018 FINAL  Final  Blood Culture (routine x 2)     Status: Abnormal   Collection Time: 09/11/18 10:10 PM  Result Value Ref Range Status   Specimen Description   Final    BLOOD RIGHT WRIST Performed at [facility], [address], [place], [place] [address]    Special Requests   Final    BOTTLES DRAWN AEROBIC AND ANAEROBIC Blood Culture results may not be optimal due to an inadequate volume of blood received in culture bottles Performed at [facility], [address] [place], [place] [address]    Culture  Setup Time   Final    Organism ID to follow [place] CRITICAL RESULT CALLED TO, READ BACK BY AND VERIFIED WITH: [facility] MARTIN AT 1236 09/12/2018.PMF GRAM STAIN REVIEWED-AGREE WITH RESULT T. TYSOR Performed at [facility], [place] [address], [place], [place] [address]    Culture KLEBSIELLA PNEUMONIAE (A)  Final   Report Status 09/14/2018 FINAL  Final   Organism ID, Bacteria KLEBSIELLA PNEUMONIAE  Final      Susceptibility   Klebsiella pneumoniae - MIC*    AMPICILLIN >=32 RESISTANT Resistant     CEFAZOLIN <=4 SENSITIVE Sensitive     CEFEPIME <=1 SENSITIVE Sensitive     CEFTAZIDIME <=1 SENSITIVE Sensitive     CEFTRIAXONE <=1 SENSITIVE Sensitive     CIPROFLOXACIN <=0.25 SENSITIVE Sensitive     GENTAMICIN <=1 SENSITIVE Sensitive     IMIPENEM <=0.25 SENSITIVE Sensitive     TRIMETH/SULFA <=20 SENSITIVE Sensitive     AMPICILLIN/SULBACTAM 16 INTERMEDIATE Intermediate     PIP/TAZO 16 SENSITIVE Sensitive     Extended ESBL NEGATIVE Sensitive     *  KLEBSIELLA PNEUMONIAE  Urine culture     Status: None   Collection Time: 09/11/18 10:10 PM  Result Value Ref Range Status   Specimen Description    Final    URINE, RANDOM Performed at [facility], [address] [place], [place] [address]    Special Requests   Final    Normal Performed at [facility], [address], [place], [place] [address]    Culture   Final    NO GROWTH Performed at [facility], [place] [address] [place], [place] [address]    Report Status 09/13/2018 FINAL  Final  SARS Coronavirus 2 [facility] order, Performed in [place] hospital lab)     Status: None   Collection Time: 09/11/18 10:10 PM  Result Value Ref Range Status   SARS Coronavirus 2 NEGATIVE NEGATIVE Final    Comment: (NOTE) If result is NEGATIVE SARS-CoV-2 target nucleic acids are NOT DETECTED. The SARS-CoV-2 RNA is generally detectable in upper and lower  respiratory specimens during the acute phase of infection. The lowest  concentration of SARS-CoV-2 viral copies this assay can detect is 250  copies / mL. A negative result does not preclude SARS-CoV-2 infection  and should not be used as the sole basis for treatment or other  patient management decisions.  A negative result may occur with  improper specimen collection / handling, submission of specimen other  than nasopharyngeal swab, presence of viral mutation(s) within the  areas targeted by this assay, and inadequate number of viral copies  (<250 copies / mL). A negative result must be combined with clinical  observations, patient history, and epidemiological information. If result is POSITIVE SARS-CoV-2 target nucleic acids are DETECTED. The SARS-CoV-2 RNA is generally detectable in upper and lower  respiratory specimens dur ing the acute phase of infection.  Positive  results are indicative of active infection with SARS-CoV-2.  Clinical  correlation with patient history and other diagnostic information is  necessary to determine patient infection status.  Positive results do  not rule out bacterial infection or co-infection with other viruses. If result  is PRESUMPTIVE POSTIVE SARS-CoV-2 nucleic acids MAY BE PRESENT.   A presumptive positive result was obtained on the submitted specimen  and confirmed on repeat testing.  While 2019 novel coronavirus  (SARS-CoV-2) nucleic acids may be present in the submitted sample  additional confirmatory testing may be necessary for epidemiological  and / or clinical management purposes  to differentiate between  SARS-CoV-2 and other Sarbecovirus currently known to infect humans.  If clinically indicated additional testing with an alternate test  methodology [phone]) is advised. The SARS-CoV-2 RNA is generally  detectable in upper and lower respiratory sp ecimens during the acute  phase of infection. The expected result is Negative. Fact Sheet for Patients:  StrictlyIdeas.no Fact Sheet for Healthcare Providers: BankingDealers.co.za This test is not yet approved or cleared by the [place] FDA and has been authorized for detection and/or diagnosis of SARS-CoV-2 by FDA under an Emergency Use Authorization (EUA).  This EUA will remain in effect (meaning this test can be used) for the duration of the COVID-19 declaration under Section 564(b)(1) of the Act, 21 U.S.C. section 360bbb-3(b)(1), unless the authorization is terminated or revoked sooner. Performed at [facility], [address] [place], [place] [address]   Blood Culture ID Panel (Reflexed)     Status: Abnormal   Collection Time: 09/11/18 10:10 PM  Result Value Ref Range Status   Enterococcus species NOT  DETECTED NOT DETECTED Final   Listeria monocytogenes NOT DETECTED NOT DETECTED Final   Staphylococcus species NOT DETECTED NOT DETECTED Final   Staphylococcus aureus (BCID) NOT DETECTED NOT DETECTED Final   Streptococcus species NOT DETECTED NOT DETECTED Final   Streptococcus agalactiae NOT DETECTED NOT DETECTED Final   Streptococcus pneumoniae NOT DETECTED NOT DETECTED Final    Streptococcus pyogenes NOT DETECTED NOT DETECTED Final   Acinetobacter baumannii NOT DETECTED NOT DETECTED Final   Enterobacteriaceae species DETECTED (A) NOT DETECTED Final    Comment: Enterobacteriaceae represent a large family of gram-negative bacteria, not a single organism. CRITICAL RESULT CALLED TO, READ BACK BY AND VERIFIED WITH: [facility] MARTIN AT 1236 09/12/2018.PMF    Enterobacter cloacae complex NOT DETECTED NOT DETECTED Final   Escherichia coli NOT DETECTED NOT DETECTED Final   Klebsiella oxytoca NOT DETECTED NOT DETECTED Final   Klebsiella pneumoniae DETECTED (A) NOT DETECTED Final    Comment: CRITICAL RESULT CALLED TO, READ BACK BY AND VERIFIED WITH: [facility] MARTIN AT 1236 09/12/2018.PMF    Proteus species NOT DETECTED NOT DETECTED Final   Serratia marcescens NOT DETECTED NOT DETECTED Final   Carbapenem resistance NOT DETECTED NOT DETECTED Final   Haemophilus influenzae NOT DETECTED NOT DETECTED Final   Neisseria meningitidis NOT DETECTED NOT DETECTED Final   Pseudomonas aeruginosa NOT DETECTED NOT DETECTED Final   Candida albicans NOT DETECTED NOT DETECTED Final   Candida glabrata NOT DETECTED NOT DETECTED Final   Candida krusei NOT DETECTED NOT DETECTED Final   Candida parapsilosis NOT DETECTED NOT DETECTED Final   Candida tropicalis NOT DETECTED NOT DETECTED Final    Comment: Performed at [facility], [address] [place], [place] [address]  Culture, blood (single) w Reflex to ID Panel     Status: None   Collection Time: 09/14/18  4:46 AM  Result Value Ref Range Status   Specimen Description BLOOD LEFT ARM  Final   Special Requests   Final    BOTTLES DRAWN AEROBIC AND ANAEROBIC Blood Culture adequate volume   Culture   Final    NO GROWTH 5 DAYS Performed at [facility], [address] [place], [place] [address]    Report Status 09/19/2018 FINAL  Final  Blood Culture (routine x 2)     Status: None (Preliminary result)   Collection Time:  09/16/18  5:41 PM  Result Value Ref Range Status   Specimen Description BLOOD LEFT WRIST  Final   Special Requests   Final    BOTTLES DRAWN AEROBIC AND ANAEROBIC Blood Culture adequate volume   Culture   Final    NO GROWTH 3 DAYS Performed at [facility], [place] [address], [place], [place] [address]    Report Status PENDING  Incomplete  Blood Culture (routine x 2)     Status: None (Preliminary result)   Collection Time: 09/16/18  5:50 PM  Result Value Ref Range Status   Specimen Description BLOOD RIGHT WRIST  Final   Special Requests   Final    BOTTLES DRAWN AEROBIC AND ANAEROBIC Blood Culture results may not be optimal due to an inadequate volume of blood received in culture bottles   Culture   Final    NO GROWTH 3 DAYS Performed at [facility], [place] [address], [place], [place] [address]    Report Status PENDING  Incomplete  SARS Coronavirus 2 [facility] order, Performed in [place] hospital lab)     Status: None   Collection Time: 09/16/18  5:55 PM  Result Value Ref Range Status   SARS Coronavirus 2 NEGATIVE NEGATIVE Final    Comment: (NOTE) If result is NEGATIVE SARS-CoV-2 target nucleic acids are NOT DETECTED. The SARS-CoV-2 RNA is generally detectable in upper and lower  respiratory specimens during the acute phase of infection. The lowest  concentration of SARS-CoV-2 viral copies this assay can detect is 250  copies / mL. A negative result does not preclude SARS-CoV-2 infection  and should not be used as the sole basis for treatment or other  patient management decisions.  A negative result may occur with  improper specimen collection / handling, submission of specimen other  than nasopharyngeal swab, presence of viral mutation(s) within the  areas targeted by this assay, and inadequate number of viral copies  (<250 copies / mL). A negative result must be combined with clinical  observations, patient history, and epidemiological information. If result is  POSITIVE SARS-CoV-2 target nucleic acids are DETECTED. The SARS-CoV-2 RNA is generally detectable in upper and lower  respiratory specimens dur ing the acute phase of infection.  Positive  results are indicative of active infection with SARS-CoV-2.  Clinical  correlation with patient history and other diagnostic information is  necessary to determine patient infection status.  Positive results do  not rule out bacterial infection or co-infection with other viruses. If result is PRESUMPTIVE POSTIVE SARS-CoV-2 nucleic acids MAY BE PRESENT.   A presumptive positive result was obtained on the submitted specimen  and confirmed on repeat testing.  While 2019 novel coronavirus  (SARS-CoV-2) nucleic acids may be present in the submitted sample  additional confirmatory testing may be necessary for epidemiological  and / or clinical management purposes  to differentiate between  SARS-CoV-2 and other Sarbecovirus currently known to infect humans.  If clinically indicated additional testing with an alternate test  methodology [phone]) is advised. The SARS-CoV-2 RNA is generally  detectable in upper and lower respiratory sp ecimens during the acute  phase of infection. The expected result is Negative. Fact Sheet for Patients:  StrictlyIdeas.no Fact Sheet for Healthcare Providers: BankingDealers.co.za This test is not yet approved or cleared by the [place] FDA and has been authorized for detection and/or diagnosis of SARS-CoV-2 by FDA under an Emergency Use Authorization (EUA).  This EUA will remain in effect (meaning this test can be used) for the duration of the COVID-19 declaration under Section 564(b)(1) of the Act, 21 U.S.C. section 360bbb-3(b)(1), unless the authorization is terminated or revoked sooner. Performed at [facility], [place] [address] [place], [place] [address]   Urine culture     Status: None   Collection Time:  09/16/18  6:12 PM  Result Value Ref Range Status   Specimen Description URINE, RANDOM  Final   Special Requests NONE  Final   Culture   Final    NO GROWTH Performed at [facility], [place] [address], [place], [place] [address]    Report Status 09/17/2018 FINAL  Final  MRSA PCR Screening     Status: None   Collection Time: 09/16/18 11:05 PM  Result Value Ref Range Status   MRSA by PCR NEGATIVE NEGATIVE Final    Comment:        The GeneXpert MRSA Assay (FDA approved for NASAL specimens only), is one component of a comprehensive MRSA colonization surveillance program. It is not intended to diagnose MRSA infection nor to guide or monitor treatment for MRSA infections. Performed at [facility], [place]  [address], [place], [place] [address]      Labs: Basic Metabolic Panel: Recent Labs  Lab 09/14/18 0446 09/16/18 1737 09/16/18 1815 09/16/18 2152 09/17/18 0400 09/18/18 0356 09/19/18 0253  NA 135 135 137  --  138 138 137  K 4.3 3.5 3.3*  --  3.3* 3.6 3.6  CL 109 103  --   --  110 109 106  CO2 22 21*  --   --  20* 20* 24  GLUCOSE 118* 235*  --   --  138* 106* 96  BUN 12 6*  --   --  6* 5* 6*  CREATININE 0.66 1.15  --   --  0.93 0.80 0.84  CALCIUM 7.5* 8.1*  --   --  7.5* 8.1* 8.3*  MG  --   --   --  1.6* 1.5* 2.0 1.9   Liver Function Tests: Recent Labs  Lab 09/14/18 0446 09/16/18 1737 09/17/18 0400 09/18/18 0356 09/19/18 0253  AST 33 108* 57* 38 30  ALT 45* 62* 43 39 35  ALKPHOS 355* 555* 380* 375* 353*  BILITOT 2.5* 3.3* 2.5* 1.8* 2.0*  PROT 4.4* 5.1* 4.0* 4.7* 4.5*  ALBUMIN 2.2* 2.5* 1.9* 2.2* 2.3*   Recent Labs  Lab 09/16/18 1737  LIPASE 14   No results for input(s): AMMONIA in the last 168 hours. CBC: Recent Labs  Lab 09/14/18 0446 09/16/18 1737 09/16/18 1815 09/17/18 0400 09/18/18 0356 09/19/18 0253  WBC 4.3 4.1  --  4.2 4.4 3.9*  NEUTROABS  --  3.4  --  3.0 2.6  --   HGB 10.2* 10.9* 10.2* 8.8* 10.1* 9.8*  HCT 29.6* 32.5* 30.0*  26.0* 29.5* 28.9*  MCV 99.0 100.3*  --  100.0 98.3 98.3  PLT 90* 69*  --  63* 79* 86*   Cardiac Enzymes: No results for input(s): CKTOTAL, CKMB, CKMBINDEX, TROPONINI in the last 168 hours. BNP: BNP (last 3 results) No results for input(s): BNP in the last 8760 hours.  ProBNP (last 3 results) No results for input(s): PROBNP in the last 8760 hours.  CBG: No results for input(s): GLUCAP in the last 168 hours.     Signed:  Florencia Reasons MD, PhD  [facility] 09/19/2018, 5:20 PM

## 2018-09-19 NOTE — Progress Notes (Signed)
[facility] Gastroenterology Progress Note  Jose [name] HCWCB 85 y.o. February 25, 1924  CC: Sepsis, history of pancreatic cancer   Subjective: He is feeling well today.  Denies any GI symptoms.  Afebrile this morning.  ROS : Baseline shortness of breath.  Negative for chest pain   Objective: Vital signs in last 24 hours: Vitals:   09/18/18 1610 09/19/18 0624  BP: (!) 163/86 136/83  Pulse: 86 89  Resp: 17 16  Temp:  98 F (36.7 C)  SpO2: 98% 99%    Physical Exam:  General.  Elderly patient.  Not in acute distress Abdomen.  Soft, nontender, nondistended, bowel sounds present. Lower extremity.  Mild edema. Neuro.  Alert/oriented x3. Psych.  Mood and affect normal.  Lab Results: Recent Labs    09/18/18 0356 09/19/18 0253  NA 138 137  K 3.6 3.6  CL 109 106  CO2 20* 24  GLUCOSE 106* 96  BUN 5* 6*  CREATININE 0.80 0.84  CALCIUM 8.1* 8.3*  MG 2.0 1.9   Recent Labs    09/18/18 0356 09/19/18 0253  AST 38 30  ALT 39 35  ALKPHOS 375* 353*  BILITOT 1.8* 2.0*  PROT 4.7* 4.5*  ALBUMIN 2.2* 2.3*   Recent Labs    09/17/18 0400 09/18/18 0356 09/19/18 0253  WBC 4.2 4.4 3.9*  NEUTROABS 3.0 2.6  --   HGB 8.8* 10.1* 9.8*  HCT 26.0* 29.5* 28.9*  MCV 100.0 98.3 98.3  PLT 63* 79* 86*   Recent Labs    09/17/18 0400  LABPROT 18.4*  INR 1.6*      Assessment/Plan: -Sepsis with Enterobacter and Klebsiella bacteremia. -History of pancreatic cancer. -Mildly elevated LFTs.  Stable.  Recommendations ------------------------- - He is afebrile.  Denies any GI symptoms.  Okay to discharge on current oral antibiotics to be continued for next 10 to 14 days. -Okay to advance diet. -Outpatient ERCP with Dr. Watt Climes next week. -GI will sign off.  Call us back if needed  Otis Brace MD, [place] 09/19/2018, 8:54 AM  Contact #  [phone]

## 2018-09-20 SURGERY — ERCP, WITH INTERVENTION IF INDICATED
Anesthesia: General

## 2018-09-21 ENCOUNTER — Telehealth: Payer: Self-pay

## 2018-09-21 ENCOUNTER — Other Ambulatory Visit: Payer: Self-pay

## 2018-09-21 ENCOUNTER — Encounter (HOSPITAL_COMMUNITY): Payer: Self-pay | Admitting: *Deleted

## 2018-09-21 ENCOUNTER — Telehealth: Payer: Self-pay | Admitting: *Deleted

## 2018-09-21 LAB — CULTURE, BLOOD (ROUTINE X 2)
Culture: NO GROWTH
Culture: NO GROWTH
Special Requests: ADEQUATE

## 2018-09-21 NOTE — Progress Notes (Signed)
Anesthesia Chart Review: SAME DAY WORK-UP (ENDO)  Case:  [id number] Date/Time:  09/22/18 1100   Procedure:  ENDOSCOPIC RETROGRADE CHOLANGIOPANCREATOGRAPHY (ERCP) WITH PROPOFOL (N/A )   Anesthesia type:  General   Pre-op diagnosis:  pancreatic cancer, bacteremia   Location:  [facility]O ROOM 1 / [place] [facility]   Surgeon:  Clarene Essex, MD      DISCUSSION: Patient is a 85 year old male scheduled for the above procedure. GI unable to add-on procedure during recent admission for fever/sepsis (Negative SARS Coronavirus 2 09/16/18), so rescheduled as out-patient.   History includes smoking, HTN, GERD, exertional dyspnea, cervical radiculopathy with neuropathy, melanoma excision, pancreatic adenocarcinoma (diagnosed after 08/10/18 ERCP).  - He underwent laparoscopic cholecystectomy 01/26/18 (following percutaneous drain at [facility] 12/28/17). Readmitted 03/11/18 with weakness and found to have elevated LFTs with dilated common bile duct. MRCP consistent with acute pancreatitis. Blood cultures positive for Enterobacteriaceae and E. Coli. Treated with antibiotics and s/p ERCP with CBD stent 03/13/18. Readmitted again 03/27/18 with elevated LFTs and s/p ERCP 03/29/18 with retrieval of displaced plastic stent with unsuccessful attempts at new CBD stent due to stricture, but underwent biliary sphincterotomy with CBD metal stent 03/30/18. Readmitted with hypotension, fever, and sepsis secondary to bilateral lower lobe lung infiltrates 04/20/18. CBD stent removed 08/10/18, but cytopathology report revealed malignant cells consistent with adenocarcinoma. Underwent another ERCP with biliary sphincterotomy with CBD metal stent 08/27/18.   - Last admission 09/16/18-09/19/18 at [facility] for fever (104) with severe sepsis due to suspected LLL pneumonia (opacity on CXR). Had prior recent hospitalization at [facility] 09/11/18-09/14/18 for sepsis with undetermined source of infection, "however blood cultures were positive for Enterobacter  and Klebsiella for which the patient was initially started on IV vancomycin and meropenem before reported discontinuation of IV vancomycin on 09/12/2018.He was found to be COVID-19 negative, influenza a and B negative,urinalysis was not suggestive of urinary tract infection, and chest x-rayreportedlydemonstrated no evidence of underlying pneumonia.the patient was continued on meropenem for 48 hours following resolution of his objective fever, before being transitioned to ciprofloxacin and Flagyl at the time of discharge, with instructions to complete 10 additional days of these oral antibiotics per infectious disease consult.While at [facility], repeat blood cultures negative, MRSA screen negative, repeat COVID testing negative, UA unremarkable, and clinically not felt to have pneumonia. Completed a total of 14 days of antibiotics given positive blood cultures at [facility]. BLE venous duplex negative for DVT. Echo showed normal LVEF, no regional wall motion abnormalities, mild aortic stenosis. LFTs remained elevated (Alk Phos, total bilirubin), but stable. Imagining suspicious for partial stent occlusion versus debris versus tumor. Out-patient ERCP planned. Known  thrombocytopenia since 01/2018, but very mild at times and worse during hospitalizations for sepsis. PLT count 69-143K since 05/2017, most recently 86K on 09/19/18, which was up from 63 and 79K the 2 days prior.   Recent admission with fever as described above. Negative SARS Coronavirus 2 09/16/18 (and 09/11/18). He has caregivers who are CNAs from [facility]. He was advised to contact [facility] on day of procedure if any fever. Anesthesia team to evaluate on the day of his procedure. Updated ENDO RN Larene Beach. Anesthesia team to evaluate on the day of surgery.    PROVIDERS: [name], MD is PCP Rosalie Doctor, MD is GI Betsy Coder, MD is ONC. As of 08/26/18, chemotherapy was not being recommended due to concern regarding patient's ability to tolerate given advanced  age.   LABS: Most recent labs include:  Lab Results  Component Value Date   WBC 3.9 (L) 09/19/2018   HGB 9.8 (L) 09/19/2018   HCT 28.9 (L) 09/19/2018   PLT 86 (L) 09/19/2018   GLUCOSE 96 09/19/2018   ALT 35 09/19/2018   AST 30 09/19/2018   NA 137 09/19/2018   K 3.6 09/19/2018   CL 106 09/19/2018   CREATININE 0.84 09/19/2018   BUN 6 (L) 09/19/2018   CO2 24 09/19/2018   INR 1.6 (H) 09/17/2018    IMAGES: CT abd/pelvis 09/16/18: IMPRESSION: 1. Since 08/18/2018, mild interval enlargement of the pancreatic head/uncinate process mass. 2. Interval placement of a common duct stent. Slight increase in mild to moderate intrahepatic and proximal extrahepatic duct dilatation. Correlate with stent function. 3. Developing portacaval adenopathy, favored to represent metastatic disease. 4. New small volume abdominopelvic ascites. 5. Increase in small bilateral pleural effusions with developing bibasilar atelectasis. Right base pulmonary nodules as detailed previously. 6. Gastric underdistention. Apparent wall thickening is likely secondary. Gastritis cannot be excluded.  1V CXR 09/16/18: IMPRESSION: Low lung volumes with left basilar opacity, potentially small pleural fluid and associated atelectasis/consolidation  CTA chest 09/12/18: IMPRESSION: 1. No pulmonary embolus or acute aortic syndrome. 2.  Aortic atherosclerosis (ICD10-I70.0). 3. Pneumobilia with metallic stent in the common bile duct.   EKG: 09/17/18: NSR   CV: BLE venous US 09/18/18: Summary: Right: There is no evidence of deep vein thrombosis in the lower extremity. No cystic structure found in the popliteal fossa. Left: There is no evidence of deep vein thrombosis in the lower extremity. No cystic structure found in the popliteal fossa.  Echo 09/14/18: IMPRESSIONS  1. The left ventricle has normal systolic function with an ejection fraction of 60-65%. The cavity size was normal. There is mildly increased left  ventricular wall thickness. Left ventricular diastolic Doppler parameters are consistent with impaired relaxation. Elevated mean left atrial pressure No evidence of left ventricular regional wall motion abnormalities.  2. The right ventricle has normal systolic function. The cavity was normal. There is no increase in right ventricular wall thickness.  3. Mildly calcified tricuspid valve leaflets.  4. Mildly thickened tricuspid valve leaflets.  5. The mitral valve is degenerative. Severe thickening of the mitral valve leaflet. Moderate calcification of the mitral valve leaflet. Mitral valve regurgitation is mild by color flow Doppler.  6. The aortic valve is tricuspid. Severely thickening of the aortic valve. Sclerosis without any evidence of stenosis of the aortic valve. Aortic valve regurgitation is trivial by color flow Doppler. Mild stenosis of the aortic valve.   Past Medical History:  Diagnosis Date  . Anxiety   . Back problem 1960  . BPH (benign prostatic hypertrophy)   . Cervical radiculopathy    with neuropathy  . Depression    situational - cancer diagnosis  . Dyspnea    with exertion  . GERD (gastroesophageal reflux disease)   . History of blood transfusion   . HOH (hard of hearing)   . Hypertension   . Hypokalemia   . Melanoma in situ ([place]) 12/2016   neck  . Pancreatic adenocarcinoma (HCC)    ,   . Pneumonia     Past Surgical History:  Procedure Laterality Date  . BILIARY STENT PLACEMENT  03/30/2018   Procedure: BILIARY STENT PLACEMENT;  Surgeon: Clarene Essex, MD;  Location: [facility] ENDOSCOPY;  Service: Endoscopy;;  . BILIARY STENT PLACEMENT N/A 08/27/2018   Procedure: BILIARY STENT PLACEMENT;  Surgeon: Clarene Essex, MD;  Location: [facility] ENDOSCOPY;  Service: Endoscopy;  Laterality: N/A;  .  CATARACT EXTRACTION Right    OD  . CHOLECYSTECTOMY N/A 01/26/2018   Procedure: LAPAROSCOPIC CHOLECYSTECTOMY;  Surgeon: Coralie Keens, MD;  Location: [place];  Service: General;  Laterality:  N/A;  . ENDOSCOPIC RETROGRADE CHOLANGIOPANCREATOGRAPHY (ERCP) WITH PROPOFOL N/A 03/13/2018   Procedure: ENDOSCOPIC RETROGRADE CHOLANGIOPANCREATOGRAPHY (ERCP) WITH PROPOFOL;  Surgeon: Lucilla Lame, MD;  Location: [facility] ENDOSCOPY;  Service: Endoscopy;  Laterality: N/A;  . ERCP N/A 03/29/2018   Procedure: ENDOSCOPIC RETROGRADE CHOLANGIOPANCREATOGRAPHY (ERCP);  Surgeon: Ronnette Juniper, MD;  Location: [place];  Service: Gastroenterology;  Laterality: N/A;  . ERCP N/A 03/30/2018   Procedure: ENDOSCOPIC RETROGRADE CHOLANGIOPANCREATOGRAPHY (ERCP);  Surgeon: Clarene Essex, MD;  Location: [place];  Service: Endoscopy;  Laterality: N/A;  . ERCP N/A 08/10/2018   Procedure: ENDOSCOPIC RETROGRADE CHOLANGIOPANCREATOGRAPHY (ERCP);  Surgeon: Clarene Essex, MD;  Location: [facility];  Service: Endoscopy;  Laterality: N/A;  Balloon sweep for stones  . ERCP N/A 08/27/2018   Procedure: ENDOSCOPIC RETROGRADE CHOLANGIOPANCREATOGRAPHY (ERCP) with STENT PLACEMENT;  Surgeon: Clarene Essex, MD;  Location: [facility] ENDOSCOPY;  Service: Endoscopy;  Laterality: N/A;  . IR PERC CHOLECYSTOSTOMY  12/28/2017  . LAPAROSCOPIC CHOLECYSTECTOMY  01/26/2018  . MELANOMA EXCISION  12/2016   in situ---Dr Dasher  neck -   . SPHINCTEROTOMY  03/30/2018   Procedure: SPHINCTEROTOMY;  Surgeon: Clarene Essex, MD;  Location: [facility] ENDOSCOPY;  Service: Endoscopy;;  . STENT REMOVAL  03/29/2018   Procedure: STENT REMOVAL;  Surgeon: Ronnette Juniper, MD;  Location: [facility] ENDOSCOPY;  Service: Gastroenterology;;  . Lavell Islam REMOVAL  08/10/2018   Procedure: STENT REMOVAL;  Surgeon: Clarene Essex, MD;  Location: [facility] ENDOSCOPY;  Service: Endoscopy;;  . TONSILLECTOMY      MEDICATIONS: No current facility-administered medications for this encounter.    Marland Kitchen acetaminophen (TYLENOL) 325 MG tablet  . ciprofloxacin (CIPRO) 500 MG tablet  . feeding supplement, ENSURE ENLIVE, (ENSURE ENLIVE) LIQD  . ferrous sulfate 325 (65 FE) MG EC tablet  . gabapentin (NEURONTIN) 600 MG tablet  .  metoprolol succinate (TOPROL-XL) 25 MG 24 hr tablet  . metroNIDAZOLE (FLAGYL) 500 MG tablet  . Multiple Vitamin (MULTIVITAMIN WITH MINERALS) TABS tablet  . omeprazole (PRILOSEC) 20 MG capsule  . pramipexole (MIRAPEX) 0.125 MG tablet  . Probiotic Product (ALIGN) 4 MG CAPS  . tamsulosin (FLOMAX) 0.4 MG CAPS capsule  . vitamin B-12 (CYANOCOBALAMIN) 500 MCG tablet    Myra Gianotti, PA-C Surgical Short Stay/Anesthesiology [facility] Phone [phone] [facility] Phone [phone] 09/21/2018 1:12 PM

## 2018-09-21 NOTE — Anesthesia Preprocedure Evaluation (Addendum)
Anesthesia Evaluation  Patient identified by MRN, date of birth, ID band Patient awake    Reviewed: Allergy & Precautions, NPO status , Patient's Chart, lab work & pertinent test results, reviewed documented beta blocker date and time   History of Anesthesia Complications Negative for: history of anesthetic complications  Airway Mallampati: II  TM Distance: >3 FB Neck ROM: Full    Dental  (+) Dental Advisory Given, Edentulous Upper, Missing,    Pulmonary shortness of breath,    breath sounds clear to auscultation       Cardiovascular hypertension, Pt. on medications and Pt. on home beta blockers  Rhythm:Regular Rate:Normal     Neuro/Psych PSYCHIATRIC DISORDERS Anxiety Depression  Neuromuscular disease    GI/Hepatic Neg liver ROS, GERD  Controlled and Medicated,Pancreatic ca   Endo/Other  negative endocrine ROS  Renal/GU negative Renal ROS     Musculoskeletal  (+) Arthritis ,   Abdominal   Peds  Hematology  (+) anemia , Hgb 9.8, Plts 86   Anesthesia Other Findings  Patient is a 85 year old male scheduled for the above procedure. GI unable to add-on procedure during recent admission for fever/sepsis (Negative SARS Coronavirus 2 09/16/18), so rescheduled as out-patient.   History includes smoking, HTN, GERD, exertional dyspnea, cervical radiculopathy with neuropathy, melanoma excision, pancreatic adenocarcinoma (diagnosed after 08/10/18 ERCP).  - He underwent laparoscopic cholecystectomy 01/26/18 (following percutaneous drain at [facility] 12/28/17). Readmitted 03/11/18 with weakness and found to have elevated LFTs with dilated common bile duct. MRCP consistent with acute pancreatitis. Blood cultures positive for Enterobacteriaceae and E. Coli. Treated with antibiotics and s/p ERCP with CBD stent 03/13/18. Readmitted again 03/27/18 with elevated LFTs and s/p ERCP 03/29/18 with retrieval of displaced plastic  stent with unsuccessful attempts at new CBD stent due to stricture, but underwent biliary sphincterotomy with CBD metal stent 03/30/18. Readmitted with hypotension, fever, and sepsis secondary to bilateral lower lobe lung infiltrates 04/20/18. CBD stent removed 08/10/18, but cytopathology report revealed malignant cells consistent with adenocarcinoma. Underwent another ERCP with biliary sphincterotomy with CBD metal stent 08/27/18.   - Last admission 09/16/18-09/19/18 at [facility] for fever (104) with severe sepsis due to suspected LLL pneumonia (opacity on CXR). Had prior recent hospitalization at [facility] 09/11/18-09/14/18 for sepsis with undetermined source of infection, "however blood cultures were positive for Enterobacter and Klebsiella for which the patient was initially started on IV vancomycin and meropenem before reported discontinuation of IV vancomycin on 09/12/2018.He was found to be COVID-19 negative, influenza a and B negative,urinalysis was not suggestive of urinary tract infection, and chest x-rayreportedlydemonstrated no evidence of underlying pneumonia.the patient was continued on meropenem for 48 hours following resolution of his objective fever, before being transitioned to ciprofloxacin and Flagyl at the time of discharge, with instructions to complete 10 additional days of these oral antibiotics per infectious disease consult.While at [facility], repeat blood cultures negative, MRSA screen negative, repeat COVID testing negative, UA unremarkable, and clinically not felt to have pneumonia. Completed a total of 14 days of antibiotics given positive blood cultures at [facility]. BLE venous duplex negative for DVT. Echo showed normal LVEF, no regional wall motion abnormalities, mild aortic stenosis. LFTs remained elevated (Alk Phos, total bilirubin), but stable. Imagining suspicious for partial stent occlusion versus debris versus tumor. Out-patient ERCP planned. Known  thrombocytopenia since 01/2018, but very mild at  times and worse during hospitalizations for sepsis. PLT count 69-143K since 05/2017, most recently 86K on 09/19/18, which was up from 63 and 79K the  2 days prior.   Recent admission with fever as described above. Negative SARS Coronavirus 2 09/16/18 (and 09/11/18). He has caregivers who are CNAs from [facility]. He was advised to contact [facility] on day of procedure if any fever. Anesthesia team to evaluate on the day of his procedure. Updated ENDO RN Larene Beach. Anesthesia team to evaluate on the day of surgery.   Reproductive/Obstetrics                          Anesthesia Physical Anesthesia Plan  ASA: III  Anesthesia Plan: General   Post-op Pain Management:    Induction: Intravenous  PONV Risk Score and Plan: 2 and Treatment may vary due to age or medical condition and Ondansetron  Airway Management Planned: Oral ETT  Additional Equipment: None  Intra-op Plan:   Post-operative Plan: Extubation in OR  Informed Consent: I have reviewed the patients History and Physical, chart, labs and discussed the procedure including the risks, benefits and alternatives for the proposed anesthesia with the patient or authorized representative who has indicated his/her understanding and acceptance.     Dental advisory given  Plan Discussed with: CRNA  Anesthesia Plan Comments: (See PAT note written 09/21/2018 by Myra Gianotti, PA-C. Same day work-up. Recent hospitalization for fever/sepsis with negative SARS Coronavirus 2 test on 09/16/18, History of pancreatic cancer, s/p CBD stent. )       Anesthesia Quick Evaluation

## 2018-09-21 NOTE — Progress Notes (Addendum)
I spoke to Jose [name]'s daughter-in-law, Jose [name], per instructions.  Jose [name] has not had any shortness of breath or chest pain. Patient had a negeative Covid test - 09/16/2018. Jose [name] is at home with care givers, Caregivers are CNA's from [facility], who do the Covid survey every shift. Jose [name] denies that patient nor his family or caregivers have  experienced any of the following: Cough Fever >100.4 Runny Nose Sore Throat Difficulty breathing/ shortness of breath Travel in past 14 days- no.  Jose [name] reported tempeture was 97.8 09/20/2018. I instructed Jose [name] to call if patient has a fever in am.

## 2018-09-21 NOTE — Telephone Encounter (Signed)
Amy nurse with [facility] left a voicemail requesting orders to see patient  one time a week for 4 weeks and then every other week for 5 weeks after patient being discharged from the hospital Amy wants to know if Dr. Silvio Pate will place an order for palliative care? Amy stated that this is a secure voicemail and it is okay to leave a detailed message for her on it.

## 2018-09-21 NOTE — Telephone Encounter (Signed)
Transitional Care Management Follow-up Telephone Call    Date discharged? 09/19/18  How have you been since you were released from the hospital? [place].   Any patient concerns? Family wants to discuss hospice referral.    Items Reviewed:  Medications reviewed: Yes  Allergies reviewed: Yes  Dietary changes reviewed: Yes  Referrals reviewed: Yes   Functional Questionnaire:  Independent - I Dependent - D    Activities of Daily Living (ADLs):    Personal hygiene - I, showers with supervision Dressing - I Eating - I Maintaining continence - I Transferring - I, cane and walker   Independent Activities of Daily Living (iADLs): Basic communication skills - I Transportation - D Meal preparation  - D, family Shopping - D, family Housework - D, caregivers Managing medications - D, family  Managing personal finances - I   Confirmed importance and date/time of follow-up visits scheduled YES  Provider Appointment booked with PCP 09/28/18 @ 1600  Confirmed with patient if condition begins to worsen call PCP or go to the ER.  Patient was given the office number and encouraged to call back with question or concerns: YES

## 2018-09-21 NOTE — Telephone Encounter (Signed)
I thought palliative care consultation was already done at the hospital--I am okay with this Okay for visits as requested They are considering hospice as well

## 2018-09-22 ENCOUNTER — Ambulatory Visit (HOSPITAL_COMMUNITY)
Admission: RE | Admit: 2018-09-22 | Discharge: 2018-09-22 | Disposition: A | Discharge status: Home / Self Care | Payer: Medicare Other | Attending: Gastroenterology | Admitting: Gastroenterology

## 2018-09-22 ENCOUNTER — Other Ambulatory Visit: Payer: Self-pay

## 2018-09-22 ENCOUNTER — Ambulatory Visit (HOSPITAL_COMMUNITY): Payer: Medicare Other

## 2018-09-22 ENCOUNTER — Encounter (HOSPITAL_COMMUNITY)
Admission: RE | Disposition: A | Discharge status: Home / Self Care | Payer: Self-pay | Source: Home / Self Care | Attending: Gastroenterology

## 2018-09-22 ENCOUNTER — Ambulatory Visit (HOSPITAL_COMMUNITY): Payer: Medicare Other | Admitting: Vascular Surgery

## 2018-09-22 ENCOUNTER — Encounter (HOSPITAL_COMMUNITY): Payer: Self-pay | Admitting: *Deleted

## 2018-09-22 DIAGNOSIS — K831 Obstruction of bile duct: Secondary | ICD-10-CM

## 2018-09-22 DIAGNOSIS — K8050 Calculus of bile duct without cholangitis or cholecystitis without obstruction: Secondary | ICD-10-CM | POA: Diagnosis not present

## 2018-09-22 DIAGNOSIS — Z87891 Personal history of nicotine dependence: Secondary | ICD-10-CM | POA: Diagnosis not present

## 2018-09-22 DIAGNOSIS — C259 Malignant neoplasm of pancreas, unspecified: Secondary | ICD-10-CM | POA: Diagnosis not present

## 2018-09-22 DIAGNOSIS — Z79899 Other long term (current) drug therapy: Secondary | ICD-10-CM | POA: Diagnosis not present

## 2018-09-22 DIAGNOSIS — K219 Gastro-esophageal reflux disease without esophagitis: Secondary | ICD-10-CM | POA: Insufficient documentation

## 2018-09-22 DIAGNOSIS — D649 Anemia, unspecified: Secondary | ICD-10-CM | POA: Insufficient documentation

## 2018-09-22 DIAGNOSIS — R7881 Bacteremia: Secondary | ICD-10-CM | POA: Insufficient documentation

## 2018-09-22 DIAGNOSIS — N400 Enlarged prostate without lower urinary tract symptoms: Secondary | ICD-10-CM | POA: Insufficient documentation

## 2018-09-22 DIAGNOSIS — Y831 Surgical operation with implant of artificial internal device as the cause of abnormal reaction of the patient, or of later complication, without mention of misadventure at the time of the procedure: Secondary | ICD-10-CM | POA: Diagnosis not present

## 2018-09-22 DIAGNOSIS — Z85820 Personal history of malignant melanoma of skin: Secondary | ICD-10-CM | POA: Insufficient documentation

## 2018-09-22 DIAGNOSIS — I10 Essential (primary) hypertension: Secondary | ICD-10-CM | POA: Insufficient documentation

## 2018-09-22 DIAGNOSIS — K8309 Other cholangitis: Secondary | ICD-10-CM | POA: Diagnosis present

## 2018-09-22 DIAGNOSIS — T85858A Stenosis due to other internal prosthetic devices, implants and grafts, initial encounter: Secondary | ICD-10-CM | POA: Insufficient documentation

## 2018-09-22 DIAGNOSIS — G629 Polyneuropathy, unspecified: Secondary | ICD-10-CM | POA: Diagnosis not present

## 2018-09-22 DIAGNOSIS — K8689 Other specified diseases of pancreas: Secondary | ICD-10-CM

## 2018-09-22 HISTORY — DX: Personal history of other medical treatment: Z92.89

## 2018-09-22 HISTORY — DX: Pneumonia, unspecified organism: J18.9

## 2018-09-22 HISTORY — PX: REMOVAL OF STONES: SHX5545

## 2018-09-22 HISTORY — DX: Depression, unspecified: F32.A

## 2018-09-22 HISTORY — DX: Major depressive disorder, single episode, unspecified: F32.9

## 2018-09-22 HISTORY — PX: ENDOSCOPIC RETROGRADE CHOLANGIOPANCREATOGRAPHY (ERCP) WITH PROPOFOL: SHX5810

## 2018-09-22 HISTORY — PX: BILIARY STENT PLACEMENT: SHX5538

## 2018-09-22 SURGERY — ENDOSCOPIC RETROGRADE CHOLANGIOPANCREATOGRAPHY (ERCP) WITH PROPOFOL
Anesthesia: General

## 2018-09-22 MED ORDER — ONDANSETRON HCL 4 MG/2ML IJ SOLN
INTRAMUSCULAR | Status: DC | PRN
  Administered 2018-09-22: 4 mg via INTRAVENOUS

## 2018-09-22 MED ORDER — SUGAMMADEX SODIUM 200 MG/2ML IV SOLN
INTRAVENOUS | Status: DC | PRN
  Administered 2018-09-22: 125 mg via INTRAVENOUS

## 2018-09-22 MED ORDER — LACTATED RINGERS IV SOLN
INTRAVENOUS | Status: DC
  Administered 2018-09-22: 1000 mL via INTRAVENOUS

## 2018-09-22 MED ORDER — PROPOFOL 10 MG/ML IV BOLUS
INTRAVENOUS | Status: DC | PRN
  Administered 2018-09-22: 50 mg via INTRAVENOUS

## 2018-09-22 MED ORDER — INDOMETHACIN 50 MG RE SUPP
RECTAL | Status: AC
  Filled 2018-09-22: qty 2

## 2018-09-22 MED ORDER — SODIUM CHLORIDE 0.9 % IV SOLN: INTRAVENOUS | Status: DC

## 2018-09-22 MED ORDER — SODIUM CHLORIDE 0.9 % IV SOLN
INTRAVENOUS | Status: DC | PRN
  Administered 2018-09-22: 12:00:00 50 ug/min via INTRAVENOUS

## 2018-09-22 MED ORDER — FENTANYL CITRATE (PF) 250 MCG/5ML IJ SOLN
INTRAMUSCULAR | Status: DC | PRN
  Administered 2018-09-22 (×2): 50 ug via INTRAVENOUS

## 2018-09-22 MED ORDER — PHENYLEPHRINE HCL (PRESSORS) 10 MG/ML IV SOLN
INTRAVENOUS | Status: DC | PRN
  Administered 2018-09-22 (×5): 80 ug via INTRAVENOUS

## 2018-09-22 MED ORDER — INDOMETHACIN 50 MG RE SUPP: 100.0000 mg | Freq: Once | RECTAL | Status: DC

## 2018-09-22 MED ORDER — SUCCINYLCHOLINE CHLORIDE 200 MG/10ML IV SOSY
PREFILLED_SYRINGE | INTRAVENOUS | Status: DC | PRN
  Administered 2018-09-22: 60 mg via INTRAVENOUS

## 2018-09-22 MED ORDER — CIPROFLOXACIN IN D5W 400 MG/200ML IV SOLN
INTRAVENOUS | Status: DC | PRN
  Administered 2018-09-22: 400 mg via INTRAVENOUS

## 2018-09-22 MED ORDER — ROCURONIUM BROMIDE 100 MG/10ML IV SOLN
INTRAVENOUS | Status: DC | PRN
  Administered 2018-09-22: 25 mg via INTRAVENOUS

## 2018-09-22 MED ORDER — CIPROFLOXACIN IN D5W 400 MG/200ML IV SOLN
INTRAVENOUS | Status: AC
  Filled 2018-09-22: qty 200

## 2018-09-22 MED ORDER — LIDOCAINE HCL (CARDIAC) PF 100 MG/5ML IV SOSY
PREFILLED_SYRINGE | INTRAVENOUS | Status: DC | PRN
  Administered 2018-09-22: 40 mg via INTRAVENOUS

## 2018-09-22 MED ORDER — EPHEDRINE SULFATE 50 MG/ML IJ SOLN: INTRAMUSCULAR | Status: DC | PRN

## 2018-09-22 NOTE — Telephone Encounter (Signed)
Left orders on VM for Jose [name]

## 2018-09-22 NOTE — Telephone Encounter (Signed)
Spoke to Jose [name]. She said that they had a discussion with the son and pt yesterday and the pt declined hospice care. Son is asking to "force" him to do it. Jose [name] advised him they cannot do that. Requesting Palliative Care just in case he changes his mine. Wants to cover all the bases.

## 2018-09-22 NOTE — Transfer of Care (Signed)
Immediate Anesthesia Transfer of Care Note  Patient: Jose [name] [facility]  Procedure(s) Performed: ENDOSCOPIC RETROGRADE CHOLANGIOPANCREATOGRAPHY (ERCP) WITH PROPOFOL (N/A ) REMOVAL OF STONES BILIARY STENT PLACEMENT  Patient Location: PACU  Anesthesia Type:General  Level of Consciousness: drowsy but cooperative  Airway & Oxygen Therapy: Patient Spontanous Breathing  Post-op Assessment: Report given to RN and Post -op Vital signs reviewed and stable  Post vital signs: Reviewed  Last Vitals:  Vitals Value Taken Time  BP    Temp    Pulse    Resp    SpO2      Last Pain:  Vitals:   09/22/18 0941  TempSrc: Oral  PainSc: 0-No pain         Complications: No apparent anesthesia complications

## 2018-09-22 NOTE — Op Note (Signed)
[facility] Patient Name: Jose [name] Procedure Date : 09/22/2018 MRN: [id number] Attending MD: Clarene Essex , MD Date of Birth: 07-Feb-1924 CSN: [id number] Age: 85 Admit Type: Inpatient Procedure:                ERCP Indications:              Ascending cholangitis in a patient with pancreatic                            cancer Providers:                Clarene Essex, MD, Vista Lawman, RN, Laverda Sorenson,                            Technician, Tawni Carnes, CRNA Referring MD:              Medicines:                General Anesthesia Complications:            No immediate complications. Estimated Blood Loss:     Estimated blood loss: none. Procedure:                Pre-Anesthesia Assessment:                           - Prior to the procedure, a History and Physical                            was performed, and patient medications and                            allergies were reviewed. The patient's tolerance of                            previous anesthesia was also reviewed. The risks                            and benefits of the procedure and the sedation                            options and risks were discussed with the patient.                            All questions were answered, and informed consent                            was obtained. Prior Anticoagulants: The patient has                            taken no previous anticoagulant or antiplatelet                            agents. ASA Grade Assessment: III - A patient with                            severe  systemic disease. After reviewing the risks                            and benefits, the patient was deemed in                            satisfactory condition to undergo the procedure.                           After obtaining informed consent, the scope was                            passed under direct vision. Throughout the                            procedure, the patient's blood pressure, pulse, and                           oxygen saturations were monitored continuously. The                            TJF-Q180V ([id number]) Olympus duodenoscope was                            introduced through the mouth, and used to inject                            contrast into and used to cannulate the bile duct.                            The ERCP was somewhat difficult due to abnormal                            anatomy. Successful completion of the procedure was                            aided by changing the patient's position to his                            left side and we were able to get excellent                            position and easily able to pass the scope in that                            position. The patient tolerated the procedure well. Scope In: Scope Out: Findings:      One metal stent originating in the biliary tree was emerging from the       major papilla. The stent was partially occluded with a moderate amount       of debris. The biliary tree was swept with an adjustable 9- 12 mm       balloon starting at the bifurcation. Sludge was swept from the duct. All  stones were removed. Nothing was found after multiple balloon       pull-through's using both sizes of balloon. And multiple balloon       pull-through did not show any residual stones or debris however there       was some resistance at the lower end of the stent and on cholangiogram       and endoscopic appearance there seemed to be some distal tumor ingrowth       so we elected to place one 10 Fr by 4 cm covered metal stent was placed       3.5 cm into the common bile duct. Bile flowed through the stent. The       stent was in good position. There was no pancreatic duct injection or       wire advancement throughout the procedure Impression:               - One partially occluded stent from the biliary                            tree was seen in the major papilla.                           -  Choledocholithiasis was found. Complete removal                            was accomplished by balloon extraction as above.                           - The biliary tree was swept and nothing was found                            after multiple balloon pull-through's.                           - One covered metal stent was placed into the                            distal common bile duct. Recommendation:           - Clear liquid diet for 6 hours.                           - Continue present medications.                           - Return to GI clinic PRN.                           - Telephone GI clinic if symptomatic PRN. Procedure Code(s):        --- Professional ---                           [phone], Endoscopic retrograde                            cholangiopancreatography (ERCP); with removal and  exchange of stent(s), biliary or pancreatic duct,                            including pre- and post-dilation and guide wire                            passage, when performed, including sphincterotomy,                            when performed, each stent exchanged                           43264, Endoscopic retrograde                            cholangiopancreatography (ERCP); with removal of                            calculi/debris from biliary/pancreatic duct(s) Diagnosis Code(s):        --- Professional ---                           P79.480X, Other mechanical complication of bile                            duct prosthesis, initial encounter                           K80.30, Calculus of bile duct with cholangitis,                            unspecified, without obstruction                           K83.09, Other cholangitis CPT copyright 2019 American Medical Association. All rights reserved. The codes documented in this report are preliminary and upon coder review may  be revised to meet current compliance requirements. Clarene Essex, MD 09/22/2018 12:53:43 PM This  report has been signed electronically. Number of Addenda: 0

## 2018-09-22 NOTE — H&P (View-Only) (Signed)
Jose [name] 11:34 AM  Subjective: Patient's recent hospital stay was reviewed and he has been fine at home for a few days has no new complaints  Objective: Signs stable afebrile no acute distress exam please see preassessment evaluation  Assessment: Pancreatic cancer with recent bout of probable cholangitis want to reevaluate his stent  Plan: Okay to proceed with ERCP with anesthesia assistance  [facility] E  Pager [phone] After 5PM or if no answer call [phone]

## 2018-09-22 NOTE — Progress Notes (Signed)
Jose [name] 11:34 AM  Subjective: Patient's recent hospital stay was reviewed and he has been fine at home for a few days has no new complaints  Objective: Signs stable afebrile no acute distress exam please see preassessment evaluation  Assessment: Pancreatic cancer with recent bout of probable cholangitis want to reevaluate his stent  Plan: Okay to proceed with ERCP with anesthesia assistance  [facility] E  Pager [phone] After 5PM or if no answer call [phone]

## 2018-09-22 NOTE — Discharge Instructions (Signed)
Clear liquid diet until 6 PM and if doing well may have soft solids and do not drive your tractor until tomorrow and call if abdominal pain nausea vomiting signs of GI bleeding or fever chills night sweats and finish your antibiotics as directed and follow-up with me as needed  YOU HAD AN ENDOSCOPIC PROCEDURE TODAY: Refer to the procedure report and other information in the discharge instructions given to you for any specific questions about what was found during the examination. If this information does not answer your questions, please call [facility] office at [phone] to clarify.   YOU SHOULD EXPECT: Some feelings of bloating in the abdomen. Passage of more gas than usual. Walking can help get rid of the air that was put into your GI tract during the procedure and reduce the bloating. If you had a lower endoscopy (such as a colonoscopy or flexible sigmoidoscopy) you may notice spotting of blood in your stool or on the toilet paper. Some abdominal soreness may be present for a day or two, also.  DIET: Your first meal following the procedure should be a light meal and then it is ok to progress to your normal diet. A half-sandwich or bowl of soup is an example of a good first meal. Heavy or fried foods are harder to digest and may make you feel nauseous or bloated. Drink plenty of fluids but you should avoid alcoholic beverages for 24 hours. If you had a esophageal dilation, please see attached instructions for diet.   ACTIVITY: Your care partner should take you home directly after the procedure. You should plan to take it easy, moving slowly for the rest of the day. You can resume normal activity the day after the procedure however YOU SHOULD NOT DRIVE, use power tools, machinery or perform tasks that involve climbing or major physical exertion for 24 hours (because of the sedation medicines used during the test).   SYMPTOMS TO REPORT IMMEDIATELY: A gastroenterologist can be reached at any hour.  Please call [phone]  for any of the following symptoms:    Following upper endoscopy (EGD, EUS, ERCP, esophageal dilation) Vomiting of blood or coffee ground material  New, significant abdominal pain  New, significant chest pain or pain under the shoulder blades  Painful or persistently difficult swallowing  New shortness of breath  Black, tarry-looking or red, bloody stools  FOLLOW UP:  If any biopsies were taken you will be contacted by phone or by letter within the next 1-3 weeks. Call [phone]  if you have not heard about the biopsies in 3 weeks.  Please also call with any specific questions about appointments or follow up tests.

## 2018-09-22 NOTE — Anesthesia Procedure Notes (Signed)
Procedure Name: Intubation Date/Time: 09/22/2018 11:42 AM Performed by: Shirlyn Goltz, CRNA Pre-anesthesia Checklist: Patient identified, Emergency Drugs available, Suction available and Patient being monitored Patient Re-evaluated:Patient Re-evaluated prior to induction Oxygen Delivery Method: Circle system utilized Preoxygenation: Pre-oxygenation with 100% oxygen Induction Type: IV induction and Rapid sequence Laryngoscope Size: Mac and 4 Grade View: Grade I Tube type: Oral Tube size: 7.0 mm Number of attempts: 1 Airway Equipment and Method: Stylet Placement Confirmation: ETT inserted through vocal cords under direct vision,  positive ETCO2 and breath sounds checked- equal and bilateral Secured at: 22 cm Tube secured with: Tape Dental Injury: Teeth and Oropharynx as per pre-operative assessment

## 2018-09-22 NOTE — Telephone Encounter (Signed)
Okay for Palliative care

## 2018-09-23 ENCOUNTER — Telehealth: Payer: Self-pay | Admitting: Internal Medicine

## 2018-09-23 ENCOUNTER — Ambulatory Visit: Payer: Medicare Other | Admitting: Internal Medicine

## 2018-09-23 NOTE — Telephone Encounter (Signed)
Copied from [place] [phone] [phone]. Topic: Quick Communication - Home Health Verbal Orders >> Sep 23, 2018  4:13 PM Celene Kras A wrote: Caller/Agency: Chris/ [place] Number: [phone] [id number] OT/PT/Skilled Nursing/Social Work/Speech Therapy: PT Frequency: 1x for 1wk, 2x for 2wks, 5x for 1wk

## 2018-09-24 ENCOUNTER — Encounter (HOSPITAL_COMMUNITY): Payer: Self-pay | Admitting: Gastroenterology

## 2018-09-24 NOTE — Telephone Encounter (Signed)
Spoke to [place].

## 2018-09-24 NOTE — Telephone Encounter (Signed)
Okay 

## 2018-09-24 NOTE — Anesthesia Postprocedure Evaluation (Signed)
Anesthesia Post Note  Patient: Jose [name]  Procedure(s) Performed: ENDOSCOPIC RETROGRADE CHOLANGIOPANCREATOGRAPHY (ERCP) WITH PROPOFOL (N/A ) REMOVAL OF STONES BILIARY STENT PLACEMENT     Patient location during evaluation: Endoscopy Anesthesia Type: General Level of consciousness: awake and alert Pain management: pain level controlled Vital Signs Assessment: post-procedure vital signs reviewed and stable Respiratory status: spontaneous breathing, nonlabored ventilation, respiratory function stable and patient connected to nasal cannula oxygen Cardiovascular status: blood pressure returned to baseline and stable Postop Assessment: no apparent nausea or vomiting Anesthetic complications: no    Last Vitals:  Vitals:   09/22/18 1300 09/22/18 1310  BP: (!) 157/67 132/62  Pulse: 64 72  Resp: 10 18  Temp:    SpO2: 97% 100%    Last Pain:  Vitals:   09/23/18 1619  TempSrc:   PainSc: 0-No pain                 Gerad Cornelio

## 2018-09-25 ENCOUNTER — Telehealth: Payer: Self-pay

## 2018-09-25 ENCOUNTER — Other Ambulatory Visit: Payer: Self-pay

## 2018-09-25 DIAGNOSIS — Z515 Encounter for palliative care: Secondary | ICD-10-CM

## 2018-09-25 NOTE — Telephone Encounter (Signed)
Jose [name] with [facility] left v/m that the palliative care team and pts family are in agreement for hospice assessment. Jose [name] is faxing over hospice referral form that can be completed or can call verbal orders to initiate services.

## 2018-09-25 NOTE — Telephone Encounter (Signed)
Yes---start hospice services I will sign the form Monday morning when I am back

## 2018-09-25 NOTE — Progress Notes (Signed)
COMMUNITY PALLIATIVE CARE SW NOTE  PATIENT NAME: Jose [name] DOB: September 03, 1923 MRN: [id number]  PRIMARY CARE PROVIDER: [name], MD  RESPONSIBLE PARTY:  Acct ID - Guarantor Home Phone Work Phone Relationship Acct Type  [id number] Verda Cumins [phone] Self P/F     [address], [place], [place] [address]     PLAN OF CARE and INTERVENTIONS:             1. GOALS OF CARE/ ADVANCE CARE PLANNING:  Goal is to keep patient at home. Patient was full code with last admission, but family to review advance directives with patient and believe he would prefer to be a DNR. Discussion to continue. 2. SOCIAL/EMOTIONAL/SPIRITUAL ASSESSMENT/ INTERVENTIONS:  SW and RN met with Jose [name] (daughter-in-law) and Jose [name] (son) for TELEHEALTH visit. Patient is doing "okay" but has needed increased care following recent hospitalizations. Jose [name] provided brief medical history - patient has pancreatic cancer. Patient is not a candidate for surgery or chemotherapy. Jose [name] said that the ERCP procedure performed on Tuesday, and it went well but they believe the tumor is growing. Family is paying for caregivers to come during the day and family checks on patient during the evening and at night. At this time, patient is staying alone during the night. Palliative team provided education on hospice services. Palliative team to discuss with providers and plan to refer to hospice.   3. PATIENT/CAREGIVER EDUCATION/ COPING:  Family is realistic with goals, and seems to be coping well. Patient is focused on his summer garden and enjoys riding on his tractor, and family believes this helps patient cope.  4. PERSONAL EMERGENCY PLAN:  Family will call 9-1-1 for emergencies. Patient does have a LifeAlert necklace and knows how to use it. Due to COVID-19 crisis, patient is remaining at home, as able.  5. COMMUNITY RESOURCES COORDINATION/ HEALTH CARE NAVIGATION:  Patient currently receiving RN and PT with [facility].  Family expressed interest in continuing PT if possible, discussion to continue. 6. FINANCIAL/LEGAL CONCERNS/INTERVENTIONS:  None.     SOCIAL HX:  Social History   Tobacco Use  . Smoking status: Never Smoker  . Smokeless tobacco: Never Used  Substance Use Topics  . Alcohol use: No    CODE STATUS:   Code Status: Prior  ADVANCED DIRECTIVES: N MOST FORM COMPLETE:  Yes HOSPICE EDUCATION PROVIDED: None   PPS: Patient is independent. Family and caregivers prepare meals.  Due to the COVID-19 crisis, this visit was done via Zoom from my office and it was initiated and consent by this patient and/or family. This was a scheduled visit.   I spent 60 minutes with patient/family, from 11:30-12:30a providing education, support and consultation.    No further [facility] visits scheduled at this time. In conjunction with Dr. Clifton James, [facility] Chief Medical Officer, and with the patient/family in agreement, the plan is to proceed with hospice services.    Margaretmary Lombard, LCSW

## 2018-09-25 NOTE — Progress Notes (Signed)
PATIENT NAME: Kaylem Gidney DOB: 08/08/1923 MRN: [id number]  PRIMARY CARE PROVIDER: [name], MD  RESPONSIBLE PARTY:  Acct ID - Guarantor Home Phone Work Phone Relationship Acct Type  [id number] Verda Cumins [phone] Self P/F     [address], [place], [place] [address]    PLAN OF CARE and INTERVENTIONS:               1.  GOALS OF CARE/ ADVANCE CARE PLANNING:  Family wants patient to remain at home for as long as his care can be managed.               2.  PATIENT/CAREGIVER EDUCATION:  Education on hospice services, symptom management of fever, education on disease progression.                4. PERSONAL EMERGENCY PLAN: Place lives alone with family check ing on him.  Patient has [employer].                 5.  DISEASE STATUS: Patient is a 85 year old patient diagnosed in August with pancreatic cancer.  Patient is not a candiate for chemo or surgery.  SW and RN met with patient's son Inocente Salles and daughter in law [name] to discuss [facility] verses hospice. Due to the COVID-19 crisis, this visit was done via telemedicine from my office and it was initiated and consent by this patient and or family. Patient currently receiving PT and nursing through [facility].  Family reports patient left the hospital AMA last Saturday.  Patient has stent in biliary drain.  [name] reports MD wanted patient to stay in the hospital until Tuesday to have stent cleaned and place cover however patient would not stay but returned as out patient for procedure.  [name] reports MD informed patient/family that tumor is getting larger.  Family reports patient remains independent with bathing and "gets around" with his walker and cane.  Patient is abe to get on his tractor and is determined to plant his garden this year.  Family states Dr Silvio Pate did talk with them about hospice.  Patient's wife was a hospice patient for a short time and died at the hospice home.  Son reports MD informed them that  patient's prognosis is 4-6 months.  [name] reports when biliary drained is not draining properly patient develops a fever and has went to the hospital.  Patient has been hospitalized x 2 the past 2 weeks.  SW and RN talk with family about hospice and family feels with patient's frequent hospitalizations and prognosis hospice services would be better for patient.  RN calls medical director Dr Clifton James and MD in agreement with hospice referral.  RN notified referral intake via email.  Family requested palliative care team not contact patient since hospice referral was being made.  Family states patient will be in agreement since it is what is best for him.  Support provided to family.  Family encouraged to call with questions or concerns.                      HISTORY OF PRESENT ILLNESS:    CODE STATUS: No Code  ADVANCED DIRECTIVES: Y MOST FORM: No PPS: 50%   PHYSICAL EXAM:   VITALS: No Vital signs taken telehealth visit  LUNGS: family denies patient having shortness of breath CARDIAC:  EXTREMITIES:  SKIN: No open areas of skin breakdown per family   NEURO: Alert and oriented  Benny Deutschman, RN 

## 2018-09-25 NOTE — Telephone Encounter (Signed)
I spoke with Crystal in triage at [facility] and notified as instructed by Dr Silvio Pate; Donella Stade voiced understanding.

## 2018-09-28 ENCOUNTER — Encounter: Payer: Self-pay | Admitting: Internal Medicine

## 2018-09-28 ENCOUNTER — Ambulatory Visit (INDEPENDENT_AMBULATORY_CARE_PROVIDER_SITE_OTHER): Payer: Medicare Other | Admitting: Internal Medicine

## 2018-09-28 ENCOUNTER — Ambulatory Visit: Payer: Medicare Other | Admitting: Internal Medicine

## 2018-09-28 VITALS — Temp 97.6°F

## 2018-09-28 DIAGNOSIS — C259 Malignant neoplasm of pancreas, unspecified: Secondary | ICD-10-CM | POA: Diagnosis not present

## 2018-09-28 DIAGNOSIS — K8050 Calculus of bile duct without cholangitis or cholecystitis without obstruction: Secondary | ICD-10-CM

## 2018-09-28 NOTE — Assessment & Plan Note (Signed)
This seems to be the cause for the bacteremia and then this past admission as well Had ERCP repeated this week and stent was partially blocked. Biliary tree cleaned out and another stent put in---hopefully will remain patent Doing well now--eating, etc

## 2018-09-28 NOTE — Progress Notes (Signed)
Subjective:    Patient ID: Jose [name], male    DOB: September 14, 1923, 85 y.o.   MRN: [id number]  HPI Virtual visit for hospital follow up Identification done Reviewed billing and he gave consent He is in son's house with DIL [name] helping. Son is there also I am in my office  Reviewed hospital visit and discharge summary  Did have repeat ERCP after he went home---they did clean out some blockage and put in another device to maintain patency Has 2 more days of cipro---no problems with this that he can tell  No fever since then Appetite is pretty good No N/V Bowels are moving fine  Has home health  Seems to be responding to PT--so are continuing this Was considering hospice--but wants to hold off till the PT is done Current Outpatient Medications on File Prior to Visit  Medication Sig Dispense Refill   acetaminophen (TYLENOL) 325 MG tablet Take 650 mg by mouth every 6 (six) hours as needed for mild pain.      ciprofloxacin (CIPRO) 250 MG tablet Take 250 mg by mouth 2 (two) times daily.      feeding supplement, ENSURE ENLIVE, (ENSURE ENLIVE) LIQD Take 6 mLs by mouth 2 (two) times daily between meals. (Patient taking differently: Take 237 mLs by mouth daily. ) 60 Bottle 0   ferrous sulfate 325 (65 FE) MG EC tablet Take 325 mg by mouth daily with breakfast.     gabapentin (NEURONTIN) 600 MG tablet Take 1 tablet (600 mg total) by mouth every morning. And 2 at bedtime (Patient taking differently: Take 600 mg by mouth 2 (two) times daily. ) 180 tablet 3   metoprolol succinate (TOPROL-XL) 25 MG 24 hr tablet TAKE ONE TABLET BY MOUTH DAILY (Patient taking differently: Take 25 mg by mouth daily. ) 30 tablet 11   metroNIDAZOLE (FLAGYL) 500 MG tablet Take 500 mg by mouth 3 (three) times daily.     Multiple Vitamin (MULTIVITAMIN WITH MINERALS) TABS tablet Take 1 tablet by mouth daily. 30 tablet 0   omeprazole (PRILOSEC) 20 MG capsule TAKE ONE CAPSULE BY MOUTH DAILY 60 capsule 11    pramipexole (MIRAPEX) 0.125 MG tablet Take 1 tablet (0.125 mg total) by mouth at bedtime as needed. (Patient taking differently: Take 0.125 mg by mouth at bedtime as needed (restless legs). ) 30 tablet 5   Probiotic Product (ALIGN) 4 MG CAPS Take 4 mg by mouth daily.     tamsulosin (FLOMAX) 0.4 MG CAPS capsule Take 1 capsule (0.4 mg total) by mouth daily. 90 capsule 3   vitamin B-12 (CYANOCOBALAMIN) 500 MCG tablet Take 500 mcg by mouth daily.     No current facility-administered medications on file prior to visit.     No Known Allergies  Past Medical History:  Diagnosis Date   Anxiety    Back problem 1960   BPH (benign prostatic hypertrophy)    Cervical radiculopathy    with neuropathy   Depression    situational - cancer diagnosis   Dyspnea    with exertion   GERD (gastroesophageal reflux disease)    History of blood transfusion    HOH (hard of hearing)    Hypertension    Hypokalemia    Melanoma in situ ([place]) 12/2016   neck   Pancreatic adenocarcinoma ([place])    ,    Pneumonia     Past Surgical History:  Procedure Laterality Date   BILIARY STENT PLACEMENT  03/30/2018   Procedure: BILIARY STENT PLACEMENT;  Surgeon: Clarene Essex, MD;  Location: [facility] ENDOSCOPY;  Service: Endoscopy;;   BILIARY STENT PLACEMENT N/A 08/27/2018   Procedure: BILIARY STENT PLACEMENT;  Surgeon: Clarene Essex, MD;  Location: [facility] ENDOSCOPY;  Service: Endoscopy;  Laterality: N/A;   BILIARY STENT PLACEMENT  09/22/2018   Procedure: BILIARY STENT PLACEMENT;  Surgeon: Clarene Essex, MD;  Location: [place];  Service: Endoscopy;;   CATARACT EXTRACTION Right    OD   CHOLECYSTECTOMY N/A 01/26/2018   Procedure: LAPAROSCOPIC CHOLECYSTECTOMY;  Surgeon: Coralie Keens, MD;  Location: [place];  Service: General;  Laterality: N/A;   ENDOSCOPIC RETROGRADE CHOLANGIOPANCREATOGRAPHY (ERCP) WITH PROPOFOL N/A 03/13/2018   Procedure: ENDOSCOPIC RETROGRADE CHOLANGIOPANCREATOGRAPHY (ERCP) WITH PROPOFOL;   Surgeon: Lucilla Lame, MD;  Location: [facility] ENDOSCOPY;  Service: Endoscopy;  Laterality: N/A;   ENDOSCOPIC RETROGRADE CHOLANGIOPANCREATOGRAPHY (ERCP) WITH PROPOFOL N/A 09/22/2018   Procedure: ENDOSCOPIC RETROGRADE CHOLANGIOPANCREATOGRAPHY (ERCP) WITH PROPOFOL;  Surgeon: Clarene Essex, MD;  Location: [place];  Service: Endoscopy;  Laterality: N/A;   ERCP N/A 03/29/2018   Procedure: ENDOSCOPIC RETROGRADE CHOLANGIOPANCREATOGRAPHY (ERCP);  Surgeon: Ronnette Juniper, MD;  Location: [place];  Service: Gastroenterology;  Laterality: N/A;   ERCP N/A 03/30/2018   Procedure: ENDOSCOPIC RETROGRADE CHOLANGIOPANCREATOGRAPHY (ERCP);  Surgeon: Clarene Essex, MD;  Location: [place];  Service: Endoscopy;  Laterality: N/A;   ERCP N/A 08/10/2018   Procedure: ENDOSCOPIC RETROGRADE CHOLANGIOPANCREATOGRAPHY (ERCP);  Surgeon: Clarene Essex, MD;  Location: [facility];  Service: Endoscopy;  Laterality: N/A;  Balloon sweep for stones   ERCP N/A 08/27/2018   Procedure: ENDOSCOPIC RETROGRADE CHOLANGIOPANCREATOGRAPHY (ERCP) with STENT PLACEMENT;  Surgeon: Clarene Essex, MD;  Location: [facility] ENDOSCOPY;  Service: Endoscopy;  Laterality: N/A;   IR PERC CHOLECYSTOSTOMY  12/28/2017   LAPAROSCOPIC CHOLECYSTECTOMY  01/26/2018   MELANOMA EXCISION  12/2016   in situ---Dr Dasher  neck -    REMOVAL OF STONES  09/22/2018   Procedure: REMOVAL OF STONES;  Surgeon: Clarene Essex, MD;  Location: [facility] ENDOSCOPY;  Service: Endoscopy;;   SPHINCTEROTOMY  03/30/2018   Procedure: Joan Mayans;  Surgeon: Clarene Essex, MD;  Location: [facility] ENDOSCOPY;  Service: Endoscopy;;   STENT REMOVAL  03/29/2018   Procedure: STENT REMOVAL;  Surgeon: Ronnette Juniper, MD;  Location: [place];  Service: Gastroenterology;;   Lavell Islam REMOVAL  08/10/2018   Procedure: STENT REMOVAL;  Surgeon: Clarene Essex, MD;  Location: [facility] ENDOSCOPY;  Service: Endoscopy;;   TONSILLECTOMY      Family History  Problem Relation Age of Onset   Cancer Son    Coronary artery disease  Neg Hx    Diabetes Neg Hx     Social History   Socioeconomic History   Marital status: Widowed    Spouse name: Not on file   Number of children: 2   Years of education: Not on file   Highest education level: Not on file  Occupational History   Occupation: retired- Psychologist, prison and probation services: [place] resource strain: Not on file   Food insecurity:    Worry: Not on file    Inability: Not on file   Transportation needs:    Medical: Not on file    Non-medical: Not on file  Tobacco Use   Smoking status: Never Smoker   Smokeless tobacco: Never Used  Substance and Sexual Activity   Alcohol use: No   Drug use: No   Sexual activity: Not on file  Lifestyle   Physical activity:    Days per week: Not on file    Minutes  per session: Not on file   Stress: Not on file  Relationships   Social connections:    Talks on phone: Patient refused    Gets together: Patient refused    Attends religious service: Patient refused    Active member of club or organization: Patient refused    Attends meetings of clubs or organizations: Patient refused    Relationship status: Patient refused   Intimate partner violence:    Fear of current or ex partner: Patient refused    Emotionally abused: Patient refused    Physically abused: Patient refused    Forced sexual activity: Patient refused  Other Topics Concern   Not on file  Social History Narrative   Has living will   Son Mikeal Hawthorne is health care POA.     Requests DNR--done 05/07/16   No feeding tube if cognitively unaware   Review of Systems  Sleeping better now No cough No breathing problems     Objective:   Physical Exam  Constitutional: He appears well-developed. No distress.  Respiratory: Effort normal. No respiratory distress.  Psychiatric: He has a normal mood and affect. His behavior is normal.           Assessment & Plan:

## 2018-09-28 NOTE — Assessment & Plan Note (Signed)
Doesn't seem to be the reason for these recent hospital stays Has decided to go ahead with hospice--once his home health PT is over (this is helping him)

## 2018-09-29 ENCOUNTER — Telehealth: Payer: Self-pay | Admitting: *Deleted

## 2018-09-29 ENCOUNTER — Inpatient Hospital Stay: Payer: Medicare Other | Admitting: Oncology

## 2018-09-29 NOTE — Telephone Encounter (Signed)
Spoke with family member today to cancel his appointment due to COVID precautions. Daughter-in-law agrees and wants to reschedule for 6 weeks. Reports they decided against Hospice at this time. Currently having home physical therapy, which will last 6 weeks and feel this is more important. Dr. Silvio Pate is following him, but they prefer to remain in contact with Dr. Benay Spice. Scheduling message sent.

## 2018-10-01 ENCOUNTER — Telehealth: Payer: Self-pay | Admitting: Oncology

## 2018-10-01 DIAGNOSIS — C259 Malignant neoplasm of pancreas, unspecified: Secondary | ICD-10-CM | POA: Diagnosis not present

## 2018-10-01 DIAGNOSIS — Z86006 Personal history of melanoma in-situ: Secondary | ICD-10-CM

## 2018-10-01 DIAGNOSIS — R7881 Bacteremia: Secondary | ICD-10-CM | POA: Diagnosis not present

## 2018-10-01 DIAGNOSIS — M5412 Radiculopathy, cervical region: Secondary | ICD-10-CM

## 2018-10-01 DIAGNOSIS — A4181 Sepsis due to Enterococcus: Secondary | ICD-10-CM | POA: Diagnosis not present

## 2018-10-01 DIAGNOSIS — A4159 Other Gram-negative sepsis: Secondary | ICD-10-CM | POA: Diagnosis not present

## 2018-10-01 NOTE — Telephone Encounter (Signed)
Scheduled appt per sch msg. Called and spoke with patients daughter, confirmed date and time

## 2018-10-02 NOTE — Interval H&P Note (Signed)
History and Physical Interval Note: Patient had 2 recent hospital stays one in [place] and 1 here both for probable cholangitis and although he has had no GI symptoms and just periodic fever he did have elevated liver tests and somewhat decreasing air in the bile ducts on CT and he comes back in for outpatient ERCP to reevaluate his stent and he has no other complaints and please see below note as update on his repeat evaluation and he has had multiple complete consult notes and H&P's and discharge dictations in the last week  10/02/2018 1:54 PM  Jose [name]  has presented today for surgery, with the diagnosis of pancreatic cancer, bacteremia.  The various methods of treatment have been discussed with the patient and family. After consideration of risks, benefits and other options for treatment, the patient has consented to  Procedure(s): ENDOSCOPIC RETROGRADE CHOLANGIOPANCREATOGRAPHY (ERCP) WITH PROPOFOL (N/A) REMOVAL OF STONES BILIARY STENT PLACEMENT as a surgical intervention.  The patient's history has been reviewed, patient examined, no change in status, stable for surgery.  I have reviewed the patient's chart and labs.  Questions were answered to the patient's satisfaction.     [place]

## 2018-10-02 NOTE — H&P (Signed)
Please see the addendum to my regular note on day of procedure

## 2018-10-19 ENCOUNTER — Telehealth: Payer: Self-pay | Admitting: Internal Medicine

## 2018-10-19 DIAGNOSIS — C259 Malignant neoplasm of pancreas, unspecified: Secondary | ICD-10-CM

## 2018-10-19 DIAGNOSIS — K831 Obstruction of bile duct: Secondary | ICD-10-CM

## 2018-10-19 NOTE — Telephone Encounter (Signed)
[name] called back to give cell phone # [phone]- she is leaving home and needs to be contacted on this phone.

## 2018-10-19 NOTE — Telephone Encounter (Signed)
Appointment 6/1 [name] aware

## 2018-10-19 NOTE — Telephone Encounter (Signed)
Jose [name] (daughter n law) Best number [phone]  [name] called she wanted to know  If pt could  come in to get labs for bilirium  She is wanting to make sure stent is opening and working properly.  She stated pt is complaining of pain in stomach all over. The pain is probably 4 today  yesterday afternoon pain was 7 or 8

## 2018-10-19 NOTE — Telephone Encounter (Signed)
Spoke with son and DIL May need to push up home visit tentatively scheduled for 2 weeks  Blood work ordered Please call DIL to set up time for the blood work (drive through)

## 2018-10-20 ENCOUNTER — Other Ambulatory Visit (INDEPENDENT_AMBULATORY_CARE_PROVIDER_SITE_OTHER): Payer: Medicare Other

## 2018-10-20 DIAGNOSIS — K831 Obstruction of bile duct: Secondary | ICD-10-CM

## 2018-10-20 DIAGNOSIS — C259 Malignant neoplasm of pancreas, unspecified: Secondary | ICD-10-CM | POA: Diagnosis not present

## 2018-10-20 LAB — CBC
HCT: 25.3 % — ABNORMAL LOW (ref 39.0–52.0)
Hemoglobin: 9.1 g/dL — ABNORMAL LOW (ref 13.0–17.0)
MCHC: 35.8 g/dL (ref 30.0–36.0)
MCV: 102.5 fl — ABNORMAL HIGH (ref 78.0–100.0)
Platelets: 119 10*3/uL — ABNORMAL LOW (ref 150.0–400.0)
RBC: 2.47 Mil/uL — ABNORMAL LOW (ref 4.22–5.81)
RDW: 23 % — ABNORMAL HIGH (ref 11.5–15.5)
WBC: 5.5 10*3/uL (ref 4.0–10.5)

## 2018-10-20 LAB — RENAL FUNCTION PANEL
Albumin: 2.6 g/dL — ABNORMAL LOW (ref 3.5–5.2)
BUN: 12 mg/dL (ref 6–23)
CO2: 24 mEq/L (ref 19–32)
Calcium: 7.9 mg/dL — ABNORMAL LOW (ref 8.4–10.5)
Chloride: 107 mEq/L (ref 96–112)
Creatinine, Ser: 1.04 mg/dL (ref 0.40–1.50)
GFR: 66.66 mL/min (ref >60.00)
Glucose, Bld: 174 mg/dL — ABNORMAL HIGH (ref 70–99)
Phosphorus: 3.1 mg/dL (ref 2.3–4.6)
Potassium: 4.1 mEq/L (ref 3.5–5.1)
Sodium: 138 mEq/L (ref 135–145)

## 2018-10-20 LAB — HEPATIC FUNCTION PANEL
ALT: 15 U/L (ref 0–53)
AST: 20 U/L (ref 0–37)
Albumin: 2.6 g/dL — ABNORMAL LOW (ref 3.5–5.2)
Alkaline Phosphatase: 191 U/L — ABNORMAL HIGH (ref 39–117)
Bilirubin, Direct: 0.3 mg/dL (ref 0.0–0.3)
Total Bilirubin: 0.7 mg/dL (ref 0.2–1.2)
Total Protein: 4.7 g/dL — ABNORMAL LOW (ref 6.0–8.3)

## 2018-10-26 ENCOUNTER — Telehealth: Payer: Self-pay

## 2018-10-26 NOTE — Telephone Encounter (Signed)
Telephone call to patient to schedule TELEHEALTH visit with patient/daughter-in-law. Patient/daughter-in-law in agreement with TELEHEALTH visit for 10-29-18, confirming time with caregiver and will call SW back to notify of time.

## 2018-10-28 ENCOUNTER — Other Ambulatory Visit: Payer: Self-pay

## 2018-10-28 ENCOUNTER — Encounter: Payer: Self-pay | Admitting: Internal Medicine

## 2018-10-28 ENCOUNTER — Ambulatory Visit: Payer: Medicare Other | Admitting: Internal Medicine

## 2018-10-28 VITALS — BP 112/58 | HR 76 | Resp 20

## 2018-10-28 DIAGNOSIS — I10 Essential (primary) hypertension: Secondary | ICD-10-CM | POA: Diagnosis not present

## 2018-10-28 DIAGNOSIS — N401 Enlarged prostate with lower urinary tract symptoms: Secondary | ICD-10-CM

## 2018-10-28 DIAGNOSIS — E441 Mild protein-calorie malnutrition: Secondary | ICD-10-CM | POA: Diagnosis not present

## 2018-10-28 DIAGNOSIS — N138 Other obstructive and reflux uropathy: Secondary | ICD-10-CM

## 2018-10-28 DIAGNOSIS — C259 Malignant neoplasm of pancreas, unspecified: Secondary | ICD-10-CM

## 2018-10-28 MED ORDER — GABAPENTIN 600 MG PO TABS: 600.0000 mg | ORAL_TABLET | Freq: Two times a day (BID) | ORAL | 0 refills | Status: DC

## 2018-10-28 NOTE — Assessment & Plan Note (Signed)
Urged him to continue the ensure

## 2018-10-28 NOTE — Assessment & Plan Note (Signed)
BP Readings from Last 3 Encounters:  10/28/18 (!) 112/58  09/22/18 132/62  09/19/18 (!) 142/75   Okay on the metoprolol Running low on I expect that could worsen--will stop this

## 2018-10-28 NOTE — Assessment & Plan Note (Signed)
Voids okay No change

## 2018-10-28 NOTE — Assessment & Plan Note (Signed)
No treatment being considered Slow weight loss Occasional abdominal pain Some pancreatic insufficiency---would start enzyme therapy if bowels worsen

## 2018-10-28 NOTE — Progress Notes (Signed)
Subjective:    Patient ID: Jose [name], male    DOB: [birth year], 85 y.o.   MRN: [id number]  HPI Home visit for follow up of pancreatic cancer DIL, Jose [name], here as usual  He is doing "fair" Does get occasional stomach--may take a day or more "to get over it" Does some mowing, etc Reviewed recent blood work--no evidence of biliary obstruction (this was after one of these stomach spells)  Hasn't weighed Does look like he has lost weight Using the ensure at times  No other pain issues---other than the knees Has spot on back he wants checked  Walking with cane Suggested he move to the walker since he is not that stable Showers himself with a chair Dresses and bathroom himself  Current Outpatient Medications on File Prior to Visit  Medication Sig Dispense Refill  . acetaminophen (TYLENOL) 325 MG tablet Take 650 mg by mouth every 6 (six) hours as needed for mild pain.     . feeding supplement, ENSURE ENLIVE, (ENSURE ENLIVE) LIQD Take 6 mLs by mouth 2 (two) times daily between meals. (Patient taking differently: Take 237 mLs by mouth daily. ) 60 Bottle 0  . ferrous sulfate 325 (65 FE) MG EC tablet Take 325 mg by mouth daily with breakfast.    . gabapentin (NEURONTIN) 600 MG tablet Take 1 tablet (600 mg total) by mouth every morning. And 2 at bedtime (Patient taking differently: Take 600 mg by mouth 2 (two) times daily. ) 180 tablet 3  . metoprolol succinate (TOPROL-XL) 25 MG 24 hr tablet TAKE ONE TABLET BY MOUTH DAILY (Patient taking differently: Take 25 mg by mouth daily. ) 30 tablet 11  . Multiple Vitamin (MULTIVITAMIN WITH MINERALS) TABS tablet Take 1 tablet by mouth daily. 30 tablet 0  . omeprazole (PRILOSEC) 20 MG capsule TAKE ONE CAPSULE BY MOUTH DAILY 60 capsule 11  . pramipexole (MIRAPEX) 0.125 MG tablet Take 1 tablet (0.125 mg total) by mouth at bedtime as needed. (Patient taking differently: Take 0.125 mg by mouth at bedtime as needed (restless legs). ) 30 tablet 5  .  Probiotic Product (ALIGN) 4 MG CAPS Take 4 mg by mouth daily.    . tamsulosin (FLOMAX) 0.4 MG CAPS capsule Take 1 capsule (0.4 mg total) by mouth daily. 90 capsule 3  . vitamin B-12 (CYANOCOBALAMIN) 500 MCG tablet Take 500 mcg by mouth daily.     No current facility-administered medications on file prior to visit.     No Known Allergies  Past Medical History:  Diagnosis Date  . Anxiety   . Back problem 1960  . BPH (benign prostatic hypertrophy)   . Cervical radiculopathy    with neuropathy  . Depression    situational - cancer diagnosis  . Dyspnea    with exertion  . GERD (gastroesophageal reflux disease)   . History of blood transfusion   . HOH (hard of hearing)   . Hypertension   . Hypokalemia   . Melanoma in situ ([place]) 12/2016   neck  . Pancreatic adenocarcinoma ([facility])    ,   . Pneumonia     Past Surgical History:  Procedure Laterality Date  . BILIARY STENT PLACEMENT  03/30/2018   Procedure: BILIARY STENT PLACEMENT;  Surgeon: Clarene Essex, MD;  Location: [facility] ENDOSCOPY;  Service: Endoscopy;;  . BILIARY STENT PLACEMENT N/A 08/27/2018   Procedure: BILIARY STENT PLACEMENT;  Surgeon: Clarene Essex, MD;  Location: [facility] ENDOSCOPY;  Service: Endoscopy;  Laterality: N/A;  . BILIARY STENT PLACEMENT  09/22/2018   Procedure: BILIARY STENT PLACEMENT;  Surgeon: Clarene Essex, MD;  Location: [place];  Service: Endoscopy;;  . CATARACT EXTRACTION Right    OD  . CHOLECYSTECTOMY N/A 01/26/2018   Procedure: LAPAROSCOPIC CHOLECYSTECTOMY;  Surgeon: Coralie Keens, MD;  Location: [place];  Service: General;  Laterality: N/A;  . ENDOSCOPIC RETROGRADE CHOLANGIOPANCREATOGRAPHY (ERCP) WITH PROPOFOL N/A 03/13/2018   Procedure: ENDOSCOPIC RETROGRADE CHOLANGIOPANCREATOGRAPHY (ERCP) WITH PROPOFOL;  Surgeon: Lucilla Lame, MD;  Location: [facility] ENDOSCOPY;  Service: Endoscopy;  Laterality: N/A;  . ENDOSCOPIC RETROGRADE CHOLANGIOPANCREATOGRAPHY (ERCP) WITH PROPOFOL N/A 09/22/2018   Procedure: ENDOSCOPIC RETROGRADE  CHOLANGIOPANCREATOGRAPHY (ERCP) WITH PROPOFOL;  Surgeon: Clarene Essex, MD;  Location: [place];  Service: Endoscopy;  Laterality: N/A;  . ERCP N/A 03/29/2018   Procedure: ENDOSCOPIC RETROGRADE CHOLANGIOPANCREATOGRAPHY (ERCP);  Surgeon: Ronnette Juniper, MD;  Location: [place];  Service: Gastroenterology;  Laterality: N/A;  . ERCP N/A 03/30/2018   Procedure: ENDOSCOPIC RETROGRADE CHOLANGIOPANCREATOGRAPHY (ERCP);  Surgeon: Clarene Essex, MD;  Location: [place];  Service: Endoscopy;  Laterality: N/A;  . ERCP N/A 08/10/2018   Procedure: ENDOSCOPIC RETROGRADE CHOLANGIOPANCREATOGRAPHY (ERCP);  Surgeon: Clarene Essex, MD;  Location: [facility];  Service: Endoscopy;  Laterality: N/A;  Balloon sweep for stones  . ERCP N/A 08/27/2018   Procedure: ENDOSCOPIC RETROGRADE CHOLANGIOPANCREATOGRAPHY (ERCP) with STENT PLACEMENT;  Surgeon: Clarene Essex, MD;  Location: [facility] ENDOSCOPY;  Service: Endoscopy;  Laterality: N/A;  . IR PERC CHOLECYSTOSTOMY  12/28/2017  . LAPAROSCOPIC CHOLECYSTECTOMY  01/26/2018  . MELANOMA EXCISION  12/2016   in situ---Dr Dasher  neck -   . REMOVAL OF STONES  09/22/2018   Procedure: REMOVAL OF STONES;  Surgeon: Clarene Essex, MD;  Location: [facility] ENDOSCOPY;  Service: Endoscopy;;  . Joan Mayans  03/30/2018   Procedure: Joan Mayans;  Surgeon: Clarene Essex, MD;  Location[facility]OSCOPY;  Service: Endoscopy;;  . STENT REMOVAL  03/29/2018   Procedure: STENT REMOVAL;  Surgeon: Ronnette Juniper, MD;  Location: [place];  Service: Gastroenterology;;  . Lavell Islam REMOVAL  08/10/2018   Procedure: STENT REMOVAL;  Surgeon: Clarene Essex, MD;  [facility] ENDOSCOPY;  Service: Endoscopy;;  . TONSILLECTOMY      Family History  Problem Relation Age of Onset  . Cancer Son   . Coronary artery disease Neg Hx   . Diabetes Neg Hx     Social History   Socioeconomic History  . Marital status: Widowed    Spouse name: Not on file  . Number of children: 2  . Years of education: Not on file  . Highest  education level: Not on file  Occupational History  . Occupation: retiredOccupational psychologist [employer]    Employer: RETIRED  Social Needs  . Financial resource strain: Not on file  . Food insecurity:    Worry: Not on file    Inability: Not on file  . Transportation needs:    Medical: Not on file    Non-medical: Not on file  Tobacco Use  . Smoking status: Never Smoker  . Smokeless tobacco: Never Used  Substance and Sexual Activity  . Alcohol use: No  . Drug use: No  . Sexual activity: Not on file  Lifestyle  . Physical activity:    Days per week: Not on file    Minutes per session: Not on file  . Stress: Not on file  Relationships  . Social connections:    Talks on phone: Patient refused    Gets together: Patient refused    Attends religious service: Patient refused    Active member  of club or organization: Patient refused    Attends meetings of clubs or organizations: Patient refused    Relationship status: Patient refused  . Intimate partner violence:    Fear of current or ex partner: Patient refused    Emotionally abused: Patient refused    Physically abused: Patient refused    Forced sexual activity: Patient refused  Other Topics Concern  . Not on file  Social History Narrative   Has living will   Son Mikeal Hawthorne is health care POA.     Requests DNR--done 05/07/16   No feeding tube if cognitively unaware   Review of Systems Sleeps fairly well Does nap--then not as much at night No chest pain No SOB Multiple loose stools daily with lots of gas---brown or orangey color Voids okay    Objective:   Physical Exam  Constitutional:  Clear wasting  Neck: No thyromegaly present.  Cardiovascular: Normal rate and regular rhythm. Exam reveals no gallop.  occ skip Very slight systolic murmur  Respiratory: Effort normal and breath sounds normal. No respiratory distress. He has no wheezes. He has no rales.  Musculoskeletal:        General: No edema.  Lymphadenopathy:    He has no  cervical adenopathy.  Skin:  Left calf lesion mostly resolved Mild irritated area to left of ~T7-8 Purpura on arms  Psychiatric: He has a normal mood and affect. His behavior is normal.           Assessment & Plan:

## 2018-10-29 ENCOUNTER — Telehealth: Payer: Self-pay

## 2018-10-29 ENCOUNTER — Other Ambulatory Visit: Payer: Self-pay

## 2018-10-29 NOTE — Telephone Encounter (Signed)
SW called to coordinate TELEHEALTH visit. [name] requested that we postpone patient's visit until 11-04-2018 at 11:00AM. Notified RN.

## 2018-11-03 ENCOUNTER — Telehealth: Payer: Self-pay

## 2018-11-03 NOTE — Telephone Encounter (Signed)
SW talked with [name] (patient's daughter-in-law). [name] reports that patient is doing "so well" right now that he is declining SW/RN visit for this month. Patient continues to receive home health PT and nursing. [name] verbalized that she will contact palliative team if anything changes.

## 2018-11-04 ENCOUNTER — Other Ambulatory Visit: Payer: Self-pay

## 2018-11-13 ENCOUNTER — Telehealth: Payer: Self-pay | Admitting: *Deleted

## 2018-11-13 ENCOUNTER — Inpatient Hospital Stay: Payer: Medicare Other | Attending: Nurse Practitioner | Admitting: Oncology

## 2018-11-13 ENCOUNTER — Other Ambulatory Visit: Payer: Self-pay

## 2018-11-13 ENCOUNTER — Inpatient Hospital Stay: Payer: Medicare Other

## 2018-11-13 ENCOUNTER — Other Ambulatory Visit: Payer: Self-pay | Admitting: *Deleted

## 2018-11-13 VITALS — BP 119/78 | HR 116 | Temp 98.1°F | Resp 17 | Ht 67.0 in | Wt 128.7 lb

## 2018-11-13 DIAGNOSIS — D539 Nutritional anemia, unspecified: Secondary | ICD-10-CM | POA: Insufficient documentation

## 2018-11-13 DIAGNOSIS — I10 Essential (primary) hypertension: Secondary | ICD-10-CM | POA: Insufficient documentation

## 2018-11-13 DIAGNOSIS — R197 Diarrhea, unspecified: Secondary | ICD-10-CM

## 2018-11-13 DIAGNOSIS — C259 Malignant neoplasm of pancreas, unspecified: Secondary | ICD-10-CM | POA: Diagnosis present

## 2018-11-13 DIAGNOSIS — D696 Thrombocytopenia, unspecified: Secondary | ICD-10-CM

## 2018-11-13 LAB — DIRECT ANTIGLOBULIN TEST (NOT AT ARMC)
DAT, IgG: NEGATIVE
DAT, complement: NEGATIVE

## 2018-11-13 LAB — RETICULOCYTES
Immature Retic Fract: 14.1 % (ref 2.3–15.9)
RBC.: 2.28 MIL/uL — ABNORMAL LOW (ref 4.22–5.81)
Retic Count, Absolute: 39.2 10*3/uL (ref 19.0–186.0)
Retic Ct Pct: 1.7 % (ref 0.4–3.1)

## 2018-11-13 LAB — CBC WITH DIFFERENTIAL (CANCER CENTER ONLY)
Abs Immature Granulocytes: 0.1 10*3/uL — ABNORMAL HIGH (ref 0.00–0.07)
Basophils Absolute: 0 10*3/uL (ref 0.0–0.1)
Basophils Relative: 1 %
Eosinophils Absolute: 0 10*3/uL (ref 0.0–0.5)
Eosinophils Relative: 1 %
HCT: 24.7 % — ABNORMAL LOW (ref 39.0–52.0)
Hemoglobin: 8.3 g/dL — ABNORMAL LOW (ref 13.0–17.0)
Immature Granulocytes: 1 %
Lymphocytes Relative: 12 %
Lymphs Abs: 0.8 10*3/uL (ref 0.7–4.0)
MCH: 35.9 pg — ABNORMAL HIGH (ref 26.0–34.0)
MCHC: 33.6 g/dL (ref 30.0–36.0)
MCV: 106.9 fL — ABNORMAL HIGH (ref 80.0–100.0)
Monocytes Absolute: 0.4 10*3/uL (ref 0.1–1.0)
Monocytes Relative: 6 %
Neutro Abs: 5.6 10*3/uL (ref 1.7–7.7)
Neutrophils Relative %: 79 %
Platelet Count: 133 10*3/uL — ABNORMAL LOW (ref 150–400)
RBC: 2.31 MIL/uL — ABNORMAL LOW (ref 4.22–5.81)
RDW: 20.9 % — ABNORMAL HIGH (ref 11.5–15.5)
WBC Count: 7.1 10*3/uL (ref 4.0–10.5)
nRBC: 0 % (ref 0.0–0.2)

## 2018-11-13 LAB — SAVE SMEAR (SSMR)

## 2018-11-13 LAB — SAMPLE TO BLOOD BANK

## 2018-11-13 MED ORDER — PANCRELIPASE (LIP-PROT-AMYL) 36000-114000 UNITS PO CPEP: 36000.0000 [IU] | ORAL_CAPSULE | Freq: Three times a day (TID) | ORAL | 0 refills | Status: AC

## 2018-11-13 NOTE — Telephone Encounter (Signed)
Left detailed message on Susan's voicemail that Dr. Silvio Pate will be primary physician for Hospice.

## 2018-11-13 NOTE — Telephone Encounter (Signed)
Yes---I will be the primary for hospice

## 2018-11-13 NOTE — Telephone Encounter (Signed)
error 

## 2018-11-13 NOTE — Telephone Encounter (Signed)
Manuela Schwartz nurse at Dr. Gearldine Shown office left a voicemail that patient and family has now agreed to [facility] care. Manuela Schwartz stated that they are glad to do the referral, but just wants to make sure that Dr. Silvio Pate will be the primary doctor for the patient and this is the request of the family. Manuela Schwartz requested a call back to confirm.

## 2018-11-13 NOTE — Progress Notes (Signed)
Left message for Dr. Caroline Sauger office [phone] to confirm they are willing to be primary MD for Hospice care. Patient has agreed to Hospice at this time. Called [facility] in [place] and spoke w/Christy and provided referral information with Dr. Lubertha Sayres being the primary hospice MD.

## 2018-11-13 NOTE — Progress Notes (Addendum)
[place] OFFICE PROGRESS NOTE   Diagnosis: Pancreas cancer  INTERVAL HISTORY:   Mr. Jose [name] returns for a scheduled visit.  He has not enrolled in hospice care.  He is followed by home health.  He underwent an ERCP on 09/22/2018 for removal of biliary stones and placement of a common bile duct stent.  Mr. Lederman complains of constant mid abdominal pain.  He is not taking pain medication.  He has increased "gas "and frequent diarrhea.  No nausea or vomiting.  He lives alone.  His son is present for today's visit by telephone.  The son reports Mr. Boutwell is not taking pain medication.  He has oxycodone at home.  He has a good appetite.  Objective:  Vital signs in last 24 hours:  Blood pressure 119/78, pulse (!) 116, temperature 98.1 F (36.7 C), temperature source Oral, resp. rate 17, height 5\' 7"  (1.702 m), weight 128 lb 11.2 oz (58.4 kg), SpO2 98 %.    HEENT: The conjunctivae are pale Lymphatics: No cervical, supraclavicular, or axillary nodes GI: No mass, nontender, no hepatosplenomegaly Vascular: No leg edema  Skin: Round slightly raised lesion at the left tibia    Lab Results:  Lab Results  Component Value Date   WBC 7.1 11/13/2018   HGB 8.3 (L) 11/13/2018   HCT 24.7 (L) 11/13/2018   MCV 106.9 (H) 11/13/2018   PLT 133 (L) 11/13/2018   NEUTROABS 5.6 11/13/2018   Blood smear: The platelets appear normal in number, numerous large and giant platelets.  Few hypolobated neutrophils.  No blasts.  Ovalocytes, acanthocytes.  The polychromasia is not increased. CMP  Lab Results  Component Value Date   NA 138 10/20/2018   K 4.1 10/20/2018   CL 107 10/20/2018   CO2 24 10/20/2018   GLUCOSE 174 (H) 10/20/2018   BUN 12 10/20/2018   CREATININE 1.04 10/20/2018   CALCIUM 7.9 (L) 10/20/2018   PROT 4.7 (L) 10/20/2018   ALBUMIN 2.6 (L) 10/20/2018   ALBUMIN 2.6 (L) 10/20/2018   AST 20 10/20/2018   ALT 15 10/20/2018   ALKPHOS 191 (H) 10/20/2018   BILITOT 0.7  10/20/2018   GFRNONAA >60 09/19/2018   GFRAA >60 09/19/2018    Medications: I have reviewed the patient's current medications.   Assessment/Plan: 1. Pancreas cancer  Hospitalization October 2019 with obstructive jaundice/sepsis (blood culture positive for E. Coli)  CT abdomen/pelvis 03/11/2018- indistinct appearance of pancreas with mild surrounding edema/stranding suspicious for acute pancreatitis; interval cholecystectomy with development of intra-and extrahepatic biliary enlargement; sigmoid colon diverticular disease   ERCP 03/13/2018-single segmental biliary stricture in the lower third of the main bile duct.  A plastic stent was placed into the common bile duct.  Hospitalization November 2019 with obstructive jaundice  CT abdomen/pelvis 03/27/2018-diffuse soft tissue inflammation about the pancreas compatible with acute pancreatitis; diffuse dilatation of the intrahepatic biliary ducts and common bile duct with a common bile duct stent in the expected position; diffuse dilatation of the pancreatic duct  ERCP 03/30/2018-major papilla to be edematous and erythematous.  Biliary tree was swept and pus was found.  Plastic stent was removed.  Metal stent was placed into the common bile duct.  Pathology on the removed stent was negative for malignant cells.    ERCP 08/10/2018-choledocholithiasis with complete removal by balloon extraction.  A stent was removed from the biliary tree.  The stent was partially occluded.  Biliary tree was swept with nothing found at the end of the procedure, there was adequate  biliary drainage and no obvious stricture remained.  Pathology on the bile duct stent showed malignant cells present consistent with adenocarcinoma.    CT abdomen/pelvis 08/18/2018-infiltrative mass in the head of the pancreas increased in volume; mass abuts the posterior half of the superior mesenteric artery; new 1.1 x 0.9 smooth pulmonary nodule right lower lobe; mild right infrahilar  adenopathy; no definite liver lesions identified; biliary stent no longer present; pneumobilia noted. 2. Acute cholecystitis August 2019 status post drain placement; status post laparoscopic cholecystectomy September 2019 3. Hospitalization December 2019 with sepsis/bilateral lower lobe pneumonia 4. BPH on Flomax 5. Hypertension 6. Macrocytic anemia 7. Thrombocytopenia   Disposition: Mr. Leger has pancreas cancer.  He is undergone placement of a palliative bile duct stent.  I suspect the abdominal pain is related to pancreas cancer.  We will contact his family and recommend he began oxycodone.  He has frequent diarrhea.  This may be related to pancreas insufficiency.  He will begin a trial of pancreatic enzyme replacement.  I encouraged him to enroll in hospice care.  His family requested we check his hemoglobin and evaluate the anemia to see if there is a reversible component.  I suspect the anemia is secondary to chronic disease, myelodysplasia, and potentially bleeding from the pancreas tumor.  I will check a reticulocyte count, direct [place], and blood smear today.  His family would like for him to return to the Cancer center.  He will return in 1 month if able.  Betsy Coder, MD  11/13/2018  1:40 PM

## 2018-11-13 NOTE — Progress Notes (Signed)
Patient arrived to appointment without his hearing aids. Nurse spoke loudly into his ear and still was not able to hear.

## 2018-11-16 ENCOUNTER — Telehealth: Payer: Self-pay | Admitting: *Deleted

## 2018-11-16 NOTE — Telephone Encounter (Signed)
Form signed.  Please fax back.

## 2018-11-16 NOTE — Telephone Encounter (Signed)
Copied from [place] [id number]. Topic: General - Other >> Nov 13, 2018  4:29 PM Nils Flack wrote: Reason for CRM: Debra from [facility] called - she is requesting a verbal order to start hospice services.  Pt would like for Dr Silvio Pate to be the attending.  Please call [phone]

## 2018-11-16 NOTE — Telephone Encounter (Signed)
Left detailed message with authorization for Debra.

## 2018-11-16 NOTE — Telephone Encounter (Signed)
Yes--I knew about this  And I will be the attending

## 2018-11-16 NOTE — Telephone Encounter (Signed)
Christy from [employer] called and asked if the order from [place] that was faxed  Could be signed by Dr Silvio Pate and faxed to [phone].

## 2018-11-16 NOTE — Telephone Encounter (Signed)
I do not see faxes that go straight to him. I did a verbal authorization earlier to [place] about him.

## 2018-11-17 ENCOUNTER — Telehealth: Payer: Self-pay | Admitting: *Deleted

## 2018-11-17 ENCOUNTER — Telehealth: Payer: Self-pay | Admitting: Oncology

## 2018-11-17 NOTE — Telephone Encounter (Signed)
Form faxed yesterday afternoon.

## 2018-11-17 NOTE — Telephone Encounter (Signed)
Called and left msg for patient  °

## 2018-11-17 NOTE — Telephone Encounter (Signed)
Spoke with daughter-in-law: He has tramadol 50 mg tabs (#15) at home for pain that was filled 09/07/2018 and none have been taken. She feels he is doing better than the admits. Tells her that he takes "a swig" of whiskey at night to sleep. [facility] will meet with them on Thursday.

## 2018-12-01 ENCOUNTER — Other Ambulatory Visit: Payer: Self-pay | Admitting: Internal Medicine

## 2018-12-11 ENCOUNTER — Inpatient Hospital Stay: Payer: Medicare Other | Admitting: Nurse Practitioner

## 2018-12-11 ENCOUNTER — Inpatient Hospital Stay: Payer: Medicare Other

## 2018-12-31 ENCOUNTER — Telehealth: Payer: Self-pay | Admitting: Internal Medicine

## 2018-12-31 NOTE — Telephone Encounter (Signed)
Pt's daughter law called to say pt passed away.

## 2018-12-31 NOTE — Telephone Encounter (Signed)
Yes---I got the fax about this

## 2019-01-06 IMAGING — CR DG CHEST 2V
2 series · 2 of 2 positions shown · non-contrast
Comparison: 04/23/2018

CLINICAL DATA: Sepsis.  Pneumonia.

EXAM:
CHEST - 2 VIEW

[chest lat]
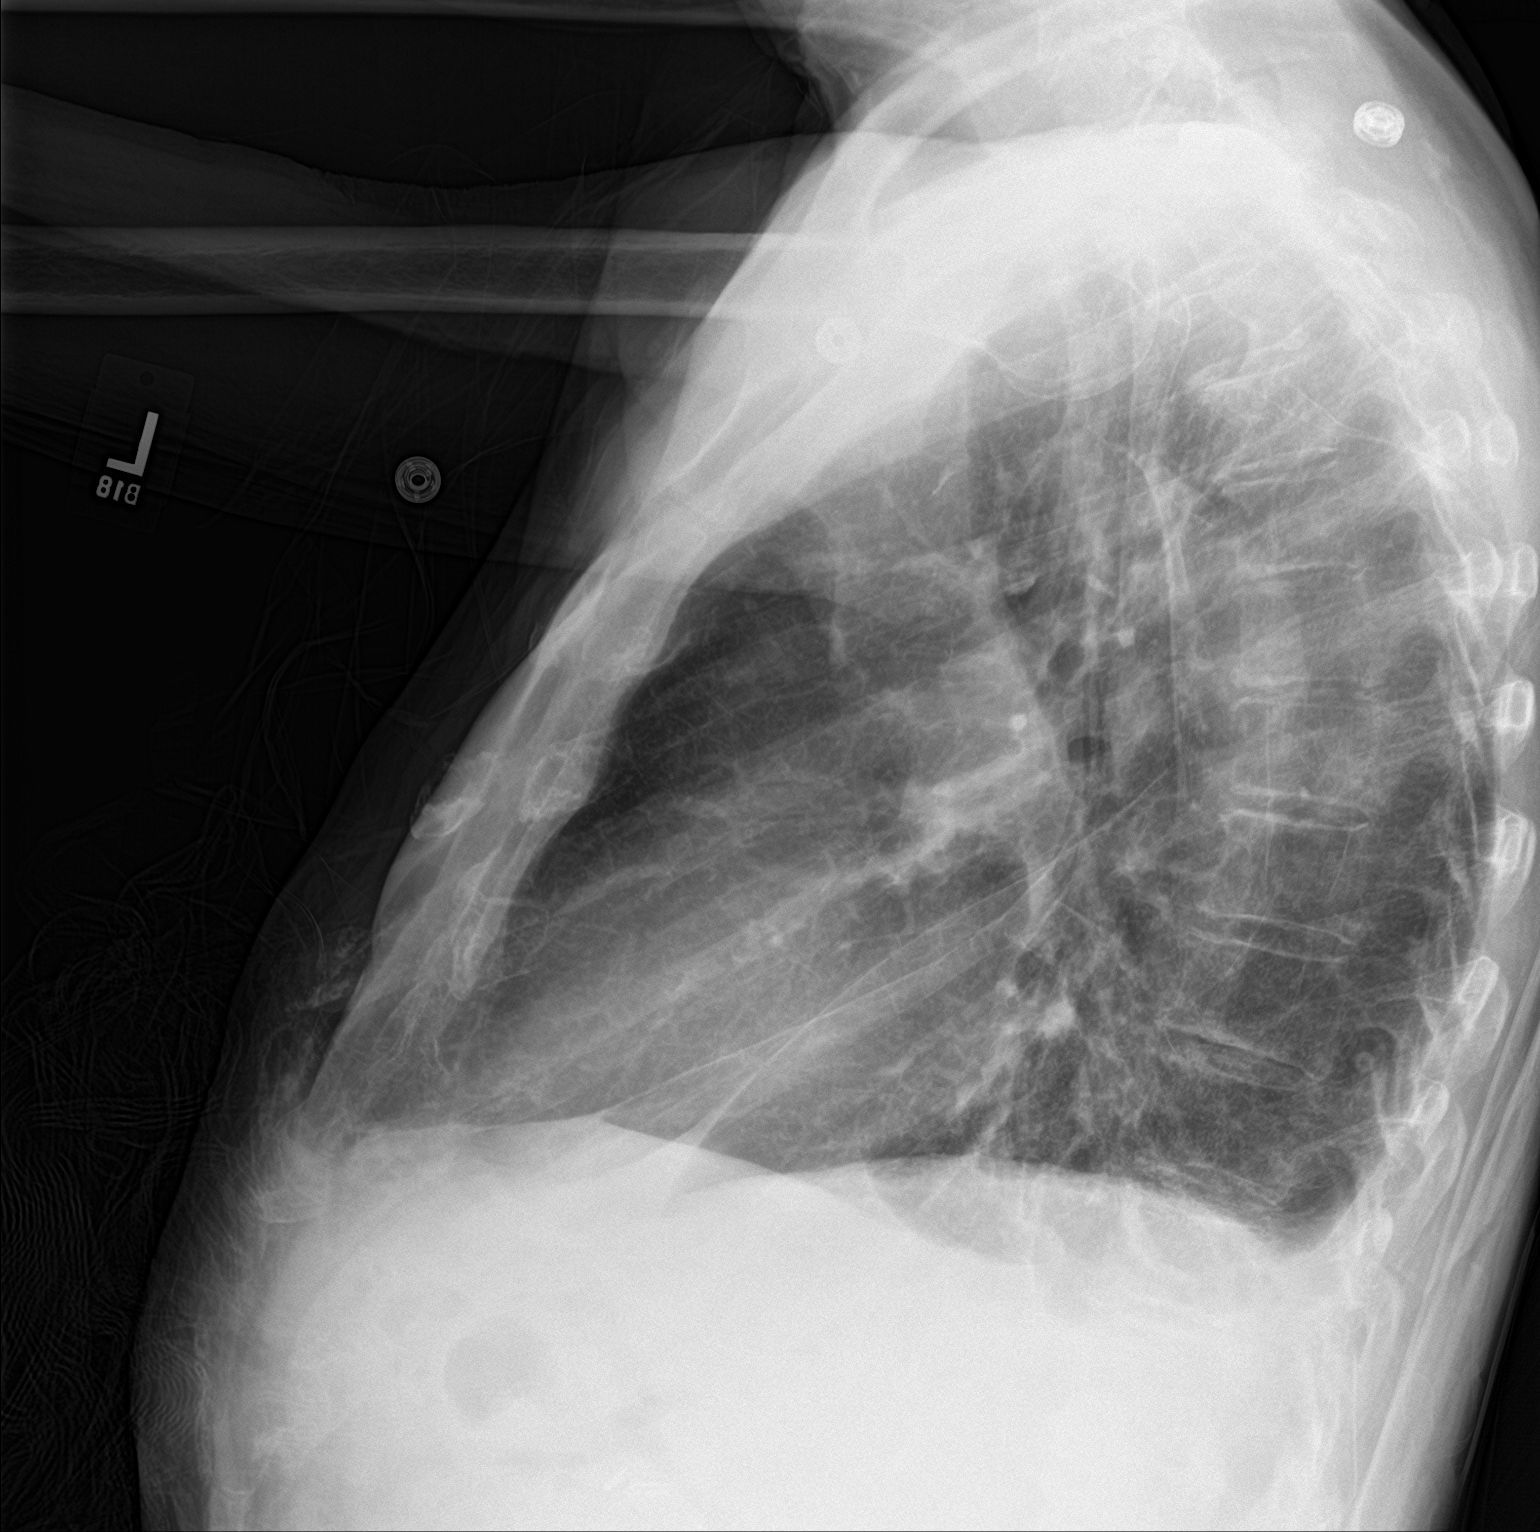

[chest ap]
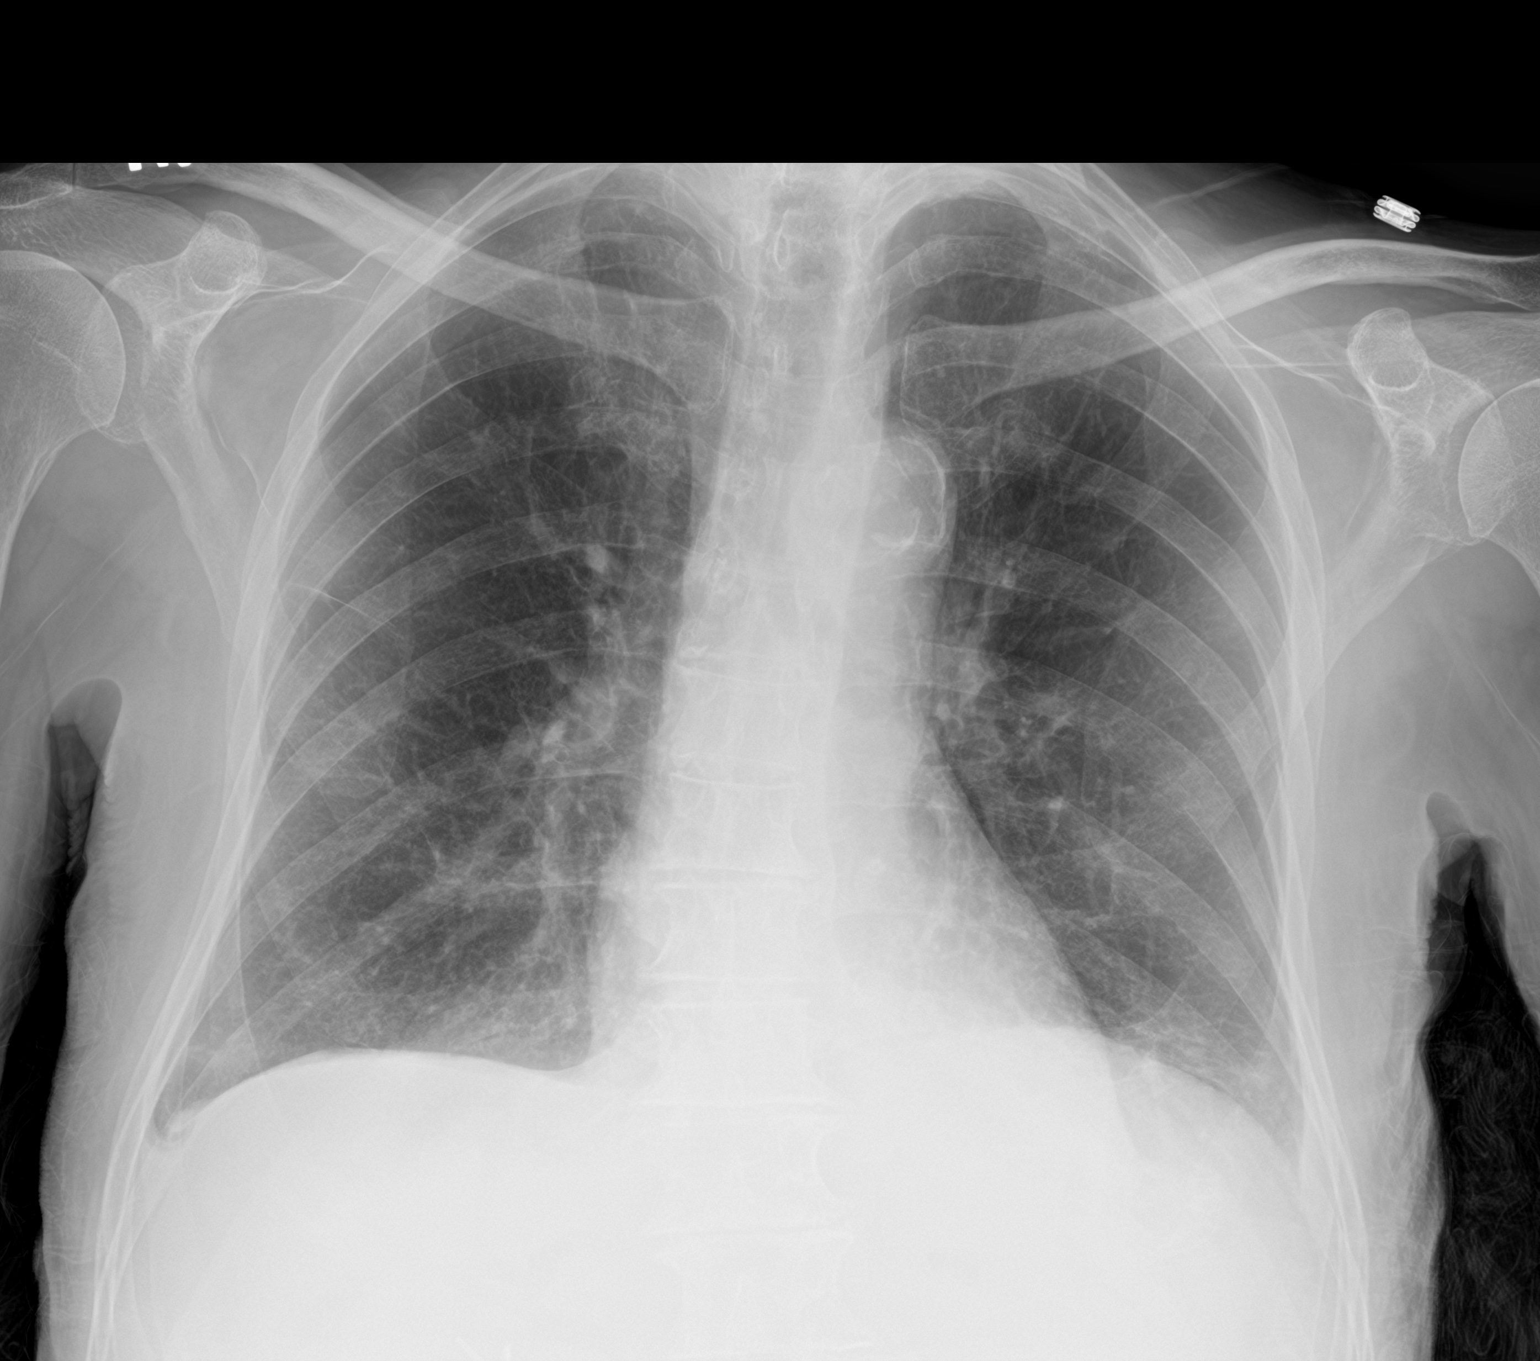

[2 of 2 positions shown; findings below may reference images not displayed]

FINDINGS: The heart size and mediastinal contours are within normal limits.
Aortic atherosclerosis. There has been near complete resolution of
bibasilar atelectasis since previous study. Tiny residual posterior
pleural effusions are seen, but also decreased since previous study.
No new or worsening areas of pulmonary opacity identified.
IMPRESSION: Near complete resolution of bibasilar atelectasis and tiny bilateral
pleural effusions.

## 2019-01-19 DEATH — deceased

## 2019-05-29 IMAGING — DX PORTABLE CHEST - 1 VIEW
1 series · 1 of 1 positions shown · non-contrast
Comparison: 09/11/2018

CLINICAL DATA: [AGE] male with shortness of breath and fever

EXAM:
PORTABLE CHEST 1 VIEW

[chest]
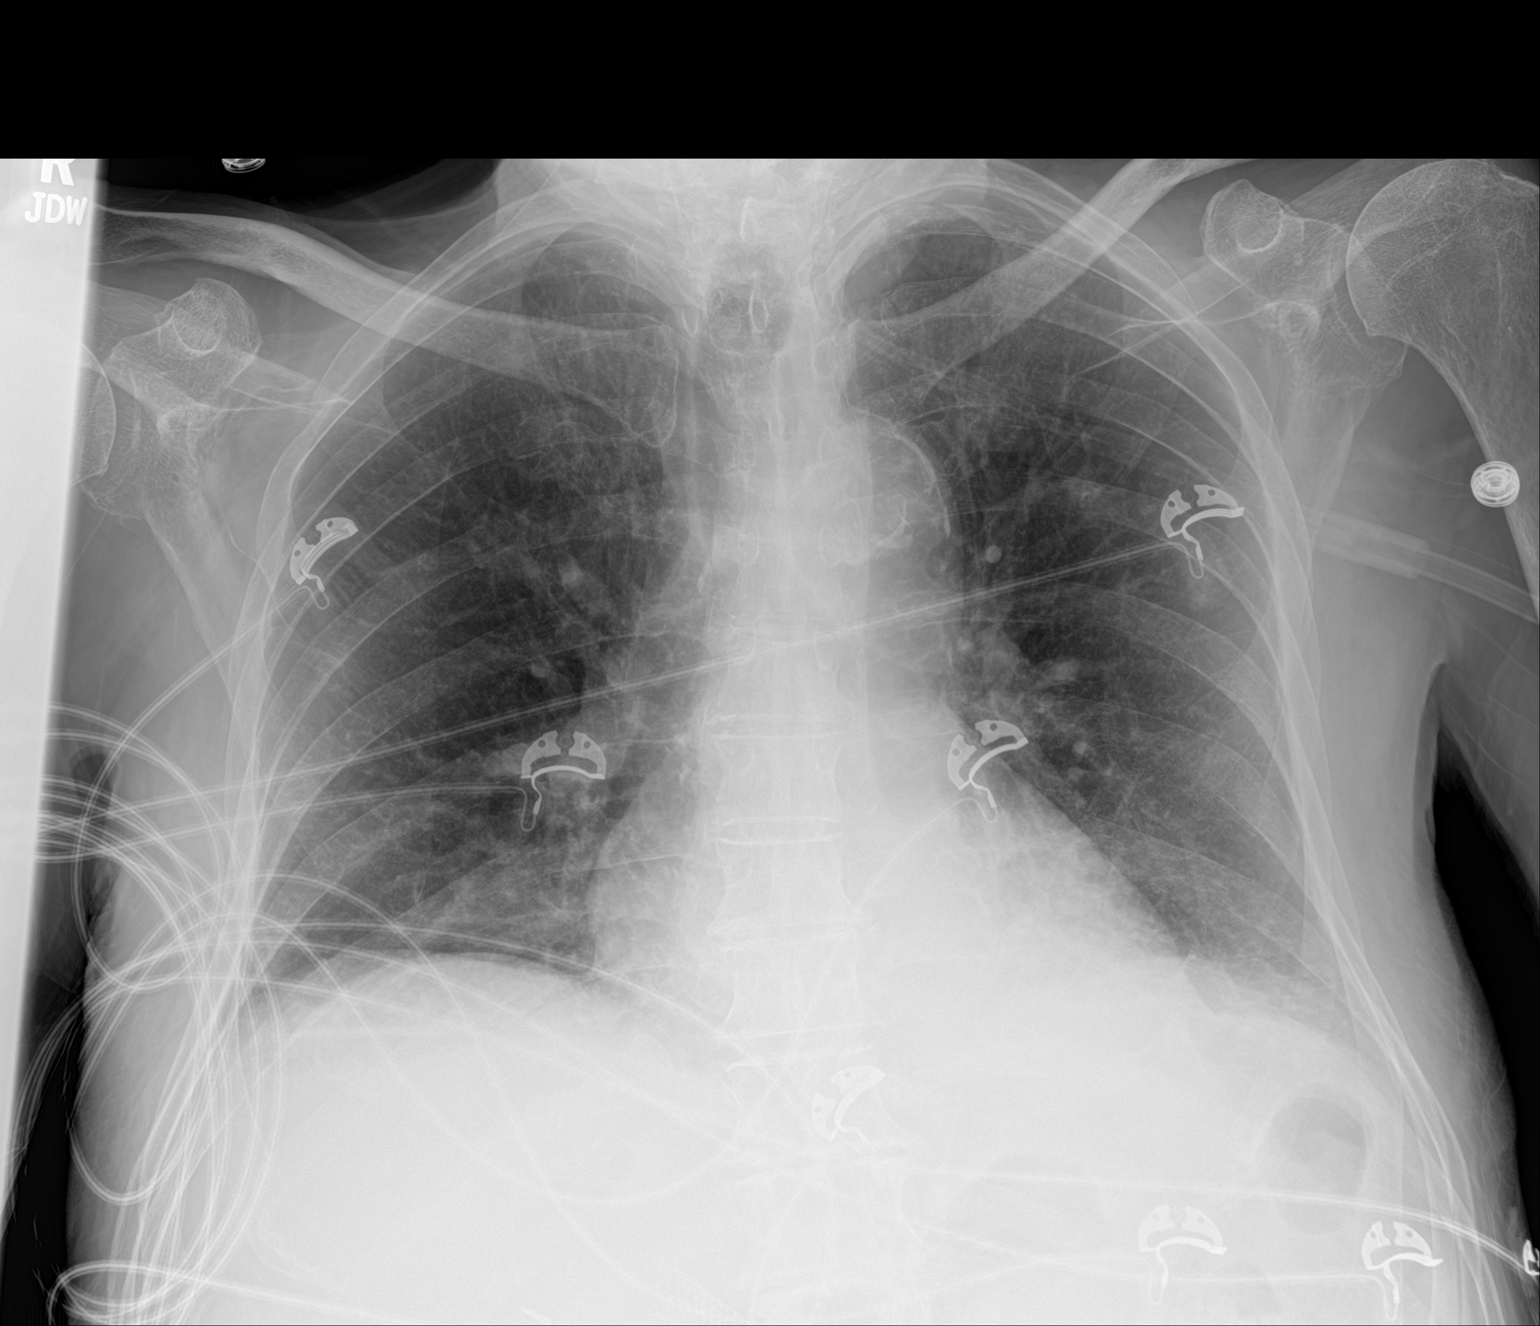

[1 of 1 positions shown; findings below may reference images not displayed]

FINDINGS: Cardiomediastinal silhouette unchanged in size and contour. No
evidence of central vascular congestion. No pneumothorax.

Low lung volumes with coarsened interstitial markings and no new
confluent airspace disease. Left basilar opacity with blunting of
the costophrenic angle.
IMPRESSION: Low lung volumes with left basilar opacity, potentially small
pleural fluid and associated atelectasis/consolidation

## 2019-06-04 IMAGING — RF ERCP
1 series · 2 of 2 positions shown · non-contrast
Comparison: 08/27/2018; 08/10/2018; CT abdomen pelvis - 09/16/2018

CLINICAL DATA: Pancreatic mass.

EXAM:
ERCP
TECHNIQUE: Multiple spot images obtained with the fluoroscopic device and
submitted for interpretation post-procedure.
FLUOROSCOPY TIME:  5 minutes, 13 seconds

[Series 1: unknown protocol · 0.20mm/px · 2 of 2 slices shown]
[im 1/2]
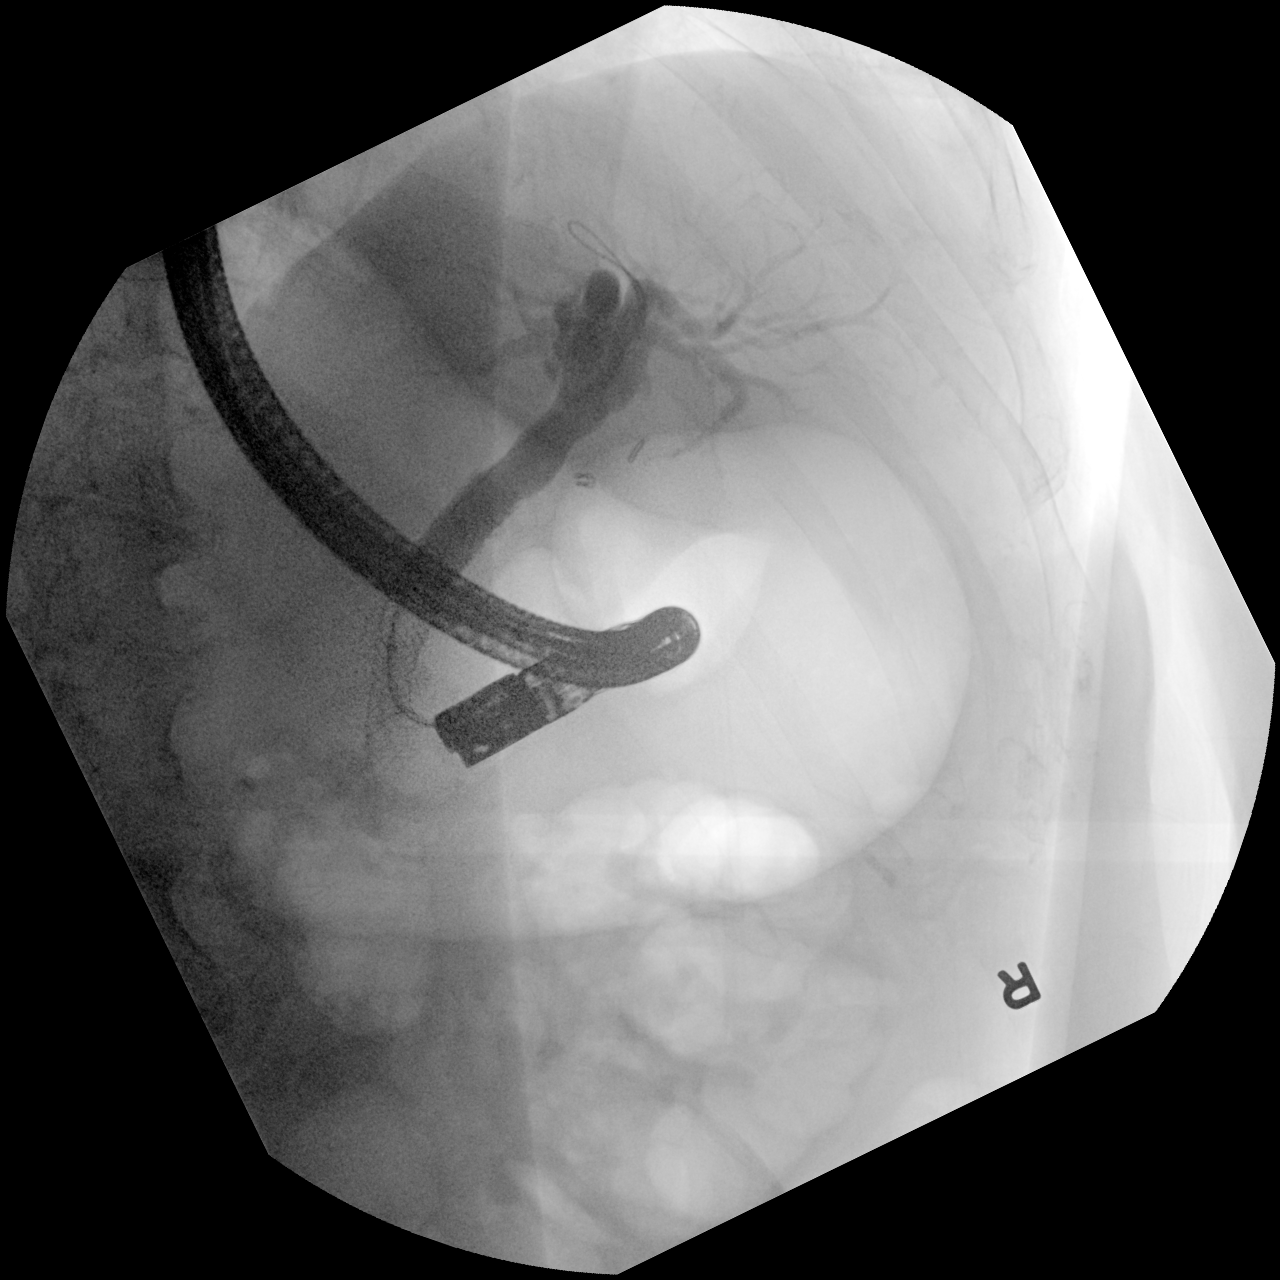
[im 2/2]
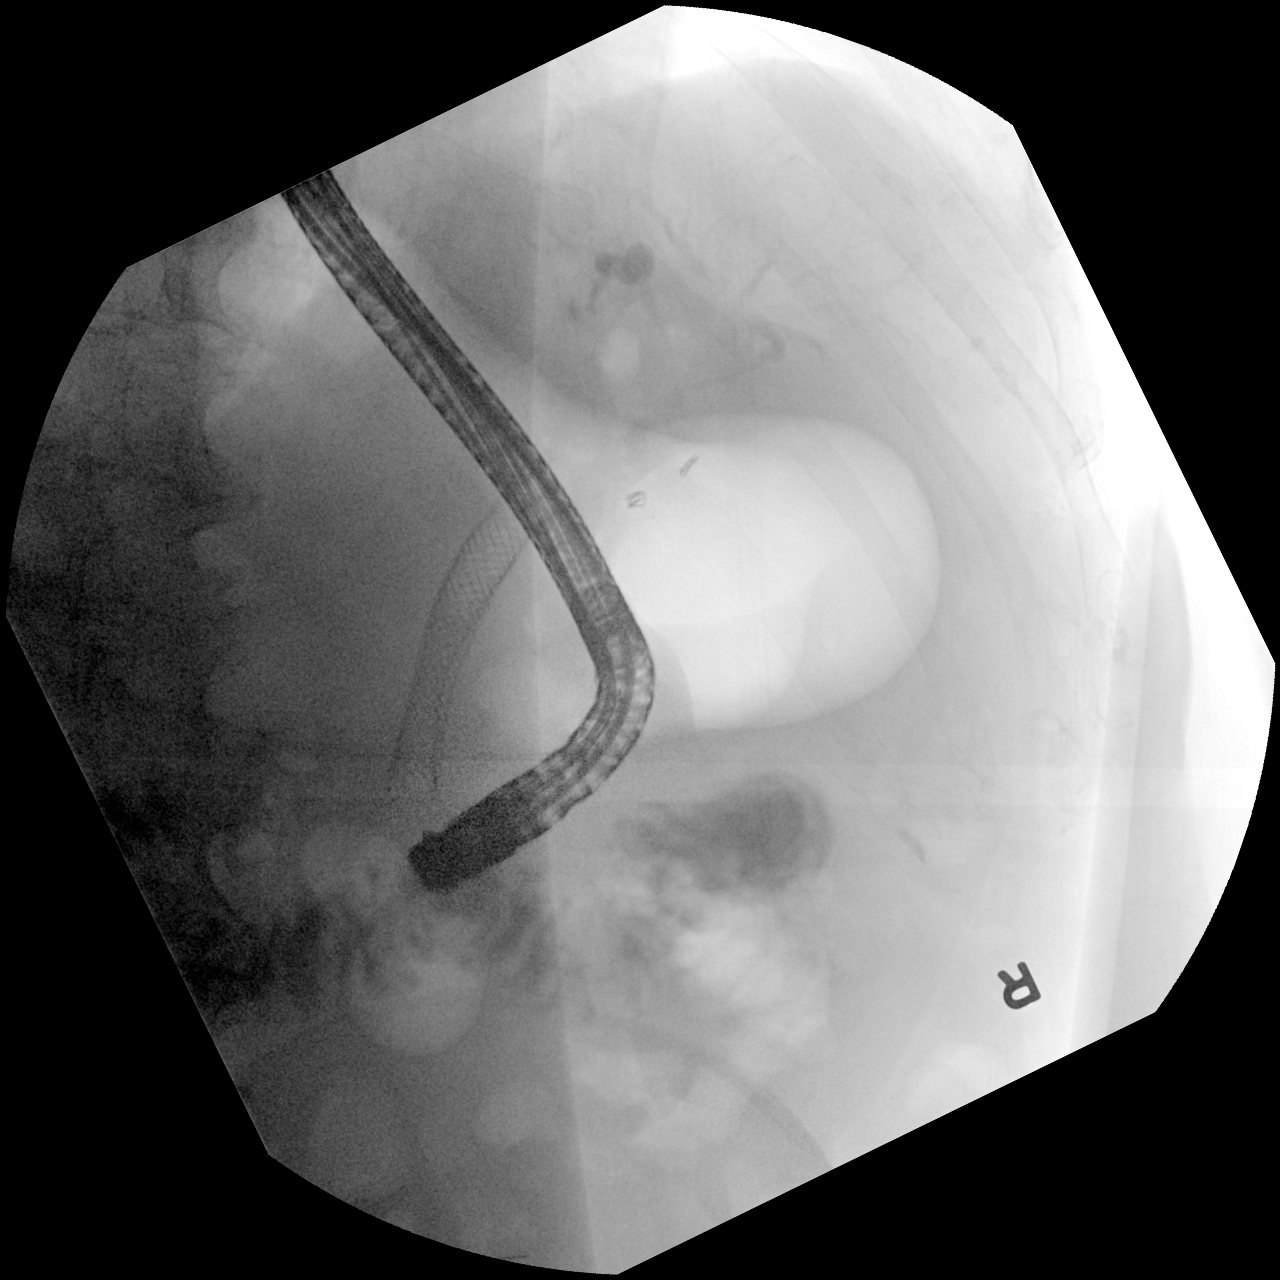

[2 of 2 positions shown; findings below may reference images not displayed]

FINDINGS: Two spot fluoroscopic images of the right upper abdominal quadrant
during ERCP are provided for review

Initial image demonstrates an ERCP probe overlying the right upper
abdominal quadrant. There is selective cannulation opacification of
the common bile duct as well as the previously placed internal
biliary stent. There is minimal opacification the central aspect of
the biliary tree which appears mildly dilated. Cholecystectomy clips
overlies expected location the gallbladder fossa however there is no
definitive opacification of the residual cystic duct remnant.

Completion image demonstrates placement of a new overlapping
internal biliary stent within the mid and distal aspects of the
pre-existing biliary stent.
IMPRESSION: ERCP with placement of a new overlapping internal biliary stent.

These images were submitted for radiologic interpretation only.
Please see the procedural report for the amount of contrast and the
fluoroscopy time utilized.
